# Patient Record
Sex: Male | Born: 1945
Health system: Southern US, Community
[De-identification: ages and names within clinical notes are randomized; demographics above are authoritative.]

## PROBLEM LIST (undated history)

## (undated) DIAGNOSIS — E7800 Pure hypercholesterolemia, unspecified: Secondary | ICD-10-CM

## (undated) DIAGNOSIS — I2510 Atherosclerotic heart disease of native coronary artery without angina pectoris: Secondary | ICD-10-CM

## (undated) DIAGNOSIS — I739 Peripheral vascular disease, unspecified: Secondary | ICD-10-CM

## (undated) DIAGNOSIS — N35919 Unspecified urethral stricture, male, unspecified site: Secondary | ICD-10-CM

## (undated) DIAGNOSIS — R0600 Dyspnea, unspecified: Secondary | ICD-10-CM

## (undated) DIAGNOSIS — E119 Type 2 diabetes mellitus without complications: Secondary | ICD-10-CM

## (undated) DIAGNOSIS — M1990 Unspecified osteoarthritis, unspecified site: Secondary | ICD-10-CM

## (undated) DIAGNOSIS — I10 Essential (primary) hypertension: Secondary | ICD-10-CM

## (undated) DIAGNOSIS — K219 Gastro-esophageal reflux disease without esophagitis: Secondary | ICD-10-CM

## (undated) DIAGNOSIS — N189 Chronic kidney disease, unspecified: Secondary | ICD-10-CM

## (undated) HISTORY — PX: CHOLECYSTECTOMY: SHX55

## (undated) HISTORY — PX: OTHER SURGICAL HISTORY: SHX169

## (undated) HISTORY — PX: EYE SURGERY: SHX253

## (undated) HISTORY — PX: BACK SURGERY: SHX140

---

## 1998-05-02 HISTORY — PX: CARDIAC CATHETERIZATION: SHX172

## 2004-04-15 ENCOUNTER — Ambulatory Visit: Payer: Self-pay | Admitting: Cardiology

## 2004-09-17 ENCOUNTER — Ambulatory Visit: Payer: Self-pay | Admitting: Cardiology

## 2005-01-12 ENCOUNTER — Ambulatory Visit: Payer: Self-pay | Admitting: Cardiology

## 2005-03-23 ENCOUNTER — Ambulatory Visit: Payer: Self-pay | Admitting: Family Medicine

## 2005-03-31 ENCOUNTER — Encounter (INDEPENDENT_AMBULATORY_CARE_PROVIDER_SITE_OTHER): Payer: Self-pay | Admitting: Internal Medicine

## 2005-04-19 ENCOUNTER — Ambulatory Visit: Payer: Self-pay | Admitting: Family Medicine

## 2005-04-21 ENCOUNTER — Ambulatory Visit (HOSPITAL_COMMUNITY): Admission: RE | Admit: 2005-04-21 | Discharge: 2005-04-21 | Payer: Self-pay | Admitting: Family Medicine

## 2005-05-02 DIAGNOSIS — I639 Cerebral infarction, unspecified: Secondary | ICD-10-CM

## 2005-05-02 HISTORY — DX: Cerebral infarction, unspecified: I63.9

## 2005-05-10 ENCOUNTER — Ambulatory Visit: Payer: Self-pay | Admitting: Family Medicine

## 2005-05-16 ENCOUNTER — Ambulatory Visit (HOSPITAL_COMMUNITY): Admission: RE | Admit: 2005-05-16 | Discharge: 2005-05-16 | Payer: Self-pay | Admitting: Family Medicine

## 2005-05-16 ENCOUNTER — Encounter (HOSPITAL_COMMUNITY): Admission: RE | Admit: 2005-05-16 | Discharge: 2005-06-15 | Payer: Self-pay | Admitting: Family Medicine

## 2005-07-18 ENCOUNTER — Ambulatory Visit: Payer: Self-pay | Admitting: Family Medicine

## 2005-08-22 ENCOUNTER — Ambulatory Visit: Payer: Self-pay | Admitting: Internal Medicine

## 2005-09-09 ENCOUNTER — Ambulatory Visit: Payer: Self-pay | Admitting: Internal Medicine

## 2005-12-31 ENCOUNTER — Encounter (INDEPENDENT_AMBULATORY_CARE_PROVIDER_SITE_OTHER): Payer: Self-pay | Admitting: Internal Medicine

## 2006-01-09 ENCOUNTER — Ambulatory Visit: Payer: Self-pay | Admitting: Internal Medicine

## 2006-02-21 ENCOUNTER — Ambulatory Visit: Payer: Self-pay | Admitting: Internal Medicine

## 2006-02-21 LAB — CONVERTED CEMR LAB
LDL Cholesterol: 153 mg/dL
Total CHOL/HDL Ratio: 4.1
Triglycerides: 104 mg/dL
VLDL: 21 mg/dL

## 2006-02-27 ENCOUNTER — Ambulatory Visit (HOSPITAL_COMMUNITY): Admission: RE | Admit: 2006-02-27 | Discharge: 2006-02-27 | Payer: Self-pay | Admitting: General Surgery

## 2006-02-27 ENCOUNTER — Encounter (INDEPENDENT_AMBULATORY_CARE_PROVIDER_SITE_OTHER): Payer: Self-pay | Admitting: *Deleted

## 2006-04-01 ENCOUNTER — Encounter (INDEPENDENT_AMBULATORY_CARE_PROVIDER_SITE_OTHER): Payer: Self-pay | Admitting: Internal Medicine

## 2006-04-03 ENCOUNTER — Ambulatory Visit: Payer: Self-pay | Admitting: Internal Medicine

## 2006-04-06 ENCOUNTER — Encounter: Payer: Self-pay | Admitting: Internal Medicine

## 2006-04-06 DIAGNOSIS — I10 Essential (primary) hypertension: Secondary | ICD-10-CM | POA: Insufficient documentation

## 2006-04-06 DIAGNOSIS — K449 Diaphragmatic hernia without obstruction or gangrene: Secondary | ICD-10-CM | POA: Insufficient documentation

## 2006-04-06 DIAGNOSIS — M545 Low back pain, unspecified: Secondary | ICD-10-CM | POA: Insufficient documentation

## 2006-04-06 DIAGNOSIS — J309 Allergic rhinitis, unspecified: Secondary | ICD-10-CM | POA: Insufficient documentation

## 2006-04-06 DIAGNOSIS — M069 Rheumatoid arthritis, unspecified: Secondary | ICD-10-CM | POA: Insufficient documentation

## 2006-04-06 DIAGNOSIS — I499 Cardiac arrhythmia, unspecified: Secondary | ICD-10-CM | POA: Insufficient documentation

## 2006-04-06 DIAGNOSIS — K219 Gastro-esophageal reflux disease without esophagitis: Secondary | ICD-10-CM | POA: Insufficient documentation

## 2006-04-06 DIAGNOSIS — E785 Hyperlipidemia, unspecified: Secondary | ICD-10-CM

## 2006-04-06 DIAGNOSIS — J40 Bronchitis, not specified as acute or chronic: Secondary | ICD-10-CM

## 2006-05-08 ENCOUNTER — Ambulatory Visit: Payer: Self-pay | Admitting: Internal Medicine

## 2006-06-05 ENCOUNTER — Ambulatory Visit: Payer: Self-pay | Admitting: Internal Medicine

## 2006-06-06 LAB — CONVERTED CEMR LAB
BUN: 21 mg/dL (ref 6–23)
Basophils Absolute: 0 10*3/uL (ref 0.0–0.1)
CO2: 23 meq/L (ref 19–32)
Chloride: 106 meq/L (ref 96–112)
Creatinine, Ser: 1.8 mg/dL — ABNORMAL HIGH (ref 0.40–1.50)
Creatinine, Urine: 196.3 mg/dL
Hemoglobin: 13.1 g/dL (ref 13.0–17.0)
Lymphocytes Relative: 24 % (ref 12–46)
Microalb Creat Ratio: 3.5 mg/g (ref 0.0–30.0)
Monocytes Absolute: 0.6 10*3/uL (ref 0.2–0.7)
Monocytes Relative: 11 % (ref 3–11)
Neutro Abs: 3.2 10*3/uL (ref 1.7–7.7)
Potassium: 4 meq/L (ref 3.5–5.3)
RBC: 4.71 M/uL (ref 4.22–5.81)

## 2006-07-17 ENCOUNTER — Ambulatory Visit: Payer: Self-pay | Admitting: Internal Medicine

## 2006-07-17 DIAGNOSIS — B353 Tinea pedis: Secondary | ICD-10-CM

## 2006-07-19 ENCOUNTER — Encounter (INDEPENDENT_AMBULATORY_CARE_PROVIDER_SITE_OTHER): Payer: Self-pay | Admitting: Internal Medicine

## 2006-07-27 ENCOUNTER — Telehealth (INDEPENDENT_AMBULATORY_CARE_PROVIDER_SITE_OTHER): Payer: Self-pay | Admitting: *Deleted

## 2006-08-09 ENCOUNTER — Telehealth (INDEPENDENT_AMBULATORY_CARE_PROVIDER_SITE_OTHER): Payer: Self-pay | Admitting: Internal Medicine

## 2006-08-10 ENCOUNTER — Telehealth (INDEPENDENT_AMBULATORY_CARE_PROVIDER_SITE_OTHER): Payer: Self-pay | Admitting: Internal Medicine

## 2006-08-18 ENCOUNTER — Encounter (INDEPENDENT_AMBULATORY_CARE_PROVIDER_SITE_OTHER): Payer: Self-pay | Admitting: Internal Medicine

## 2006-08-21 ENCOUNTER — Ambulatory Visit: Payer: Self-pay | Admitting: Internal Medicine

## 2006-08-22 LAB — CONVERTED CEMR LAB
AST: 25 units/L (ref 0–37)
Albumin: 4.1 g/dL (ref 3.5–5.2)
Alkaline Phosphatase: 92 units/L (ref 39–117)
BUN: 18 mg/dL (ref 6–23)
Creatinine, Ser: 1.62 mg/dL — ABNORMAL HIGH (ref 0.40–1.50)
HDL: 53 mg/dL (ref >39)
LDL Cholesterol: 93 mg/dL (ref 0–99)
Potassium: 4 meq/L (ref 3.5–5.3)
Total Bilirubin: 0.5 mg/dL (ref 0.3–1.2)
Total CHOL/HDL Ratio: 3.2
Triglycerides: 104 mg/dL (ref <150)
VLDL: 21 mg/dL (ref 0–40)

## 2006-08-25 ENCOUNTER — Encounter (INDEPENDENT_AMBULATORY_CARE_PROVIDER_SITE_OTHER): Payer: Self-pay | Admitting: Internal Medicine

## 2006-10-16 ENCOUNTER — Ambulatory Visit: Payer: Self-pay | Admitting: Internal Medicine

## 2006-10-16 ENCOUNTER — Telehealth (INDEPENDENT_AMBULATORY_CARE_PROVIDER_SITE_OTHER): Payer: Self-pay | Admitting: *Deleted

## 2006-10-16 DIAGNOSIS — K5900 Constipation, unspecified: Secondary | ICD-10-CM | POA: Insufficient documentation

## 2006-10-16 LAB — CONVERTED CEMR LAB: Hgb A1c MFr Bld: 6.6 %

## 2007-01-05 ENCOUNTER — Encounter (INDEPENDENT_AMBULATORY_CARE_PROVIDER_SITE_OTHER): Payer: Self-pay | Admitting: Internal Medicine

## 2007-01-15 ENCOUNTER — Ambulatory Visit: Payer: Self-pay | Admitting: Internal Medicine

## 2007-01-15 DIAGNOSIS — R0602 Shortness of breath: Secondary | ICD-10-CM

## 2007-01-22 ENCOUNTER — Encounter (INDEPENDENT_AMBULATORY_CARE_PROVIDER_SITE_OTHER): Payer: Self-pay

## 2007-03-14 ENCOUNTER — Ambulatory Visit: Payer: Self-pay | Admitting: Internal Medicine

## 2007-03-14 LAB — CONVERTED CEMR LAB
Nitrite: NEGATIVE
Protein, U semiquant: NEGATIVE
Urobilinogen, UA: 1

## 2007-03-15 ENCOUNTER — Encounter (INDEPENDENT_AMBULATORY_CARE_PROVIDER_SITE_OTHER): Payer: Self-pay | Admitting: Internal Medicine

## 2007-03-15 LAB — CONVERTED CEMR LAB: RBC / HPF: NONE SEEN (ref <3)

## 2007-03-28 ENCOUNTER — Encounter (INDEPENDENT_AMBULATORY_CARE_PROVIDER_SITE_OTHER): Payer: Self-pay | Admitting: Internal Medicine

## 2007-04-16 ENCOUNTER — Telehealth (INDEPENDENT_AMBULATORY_CARE_PROVIDER_SITE_OTHER): Payer: Self-pay | Admitting: *Deleted

## 2007-04-16 ENCOUNTER — Ambulatory Visit: Payer: Self-pay | Admitting: Internal Medicine

## 2007-04-16 ENCOUNTER — Ambulatory Visit (HOSPITAL_COMMUNITY): Admission: RE | Admit: 2007-04-16 | Discharge: 2007-04-16 | Payer: Self-pay | Admitting: Internal Medicine

## 2007-04-16 DIAGNOSIS — N401 Enlarged prostate with lower urinary tract symptoms: Secondary | ICD-10-CM

## 2007-04-16 DIAGNOSIS — N400 Enlarged prostate without lower urinary tract symptoms: Secondary | ICD-10-CM | POA: Insufficient documentation

## 2007-04-16 DIAGNOSIS — R071 Chest pain on breathing: Secondary | ICD-10-CM

## 2007-04-23 ENCOUNTER — Encounter (INDEPENDENT_AMBULATORY_CARE_PROVIDER_SITE_OTHER): Payer: Self-pay | Admitting: Internal Medicine

## 2007-04-23 DIAGNOSIS — D72821 Monocytosis (symptomatic): Secondary | ICD-10-CM

## 2007-05-03 ENCOUNTER — Encounter: Payer: Self-pay | Admitting: Family Medicine

## 2007-06-01 ENCOUNTER — Ambulatory Visit: Payer: Self-pay | Admitting: Internal Medicine

## 2007-06-18 ENCOUNTER — Encounter (INDEPENDENT_AMBULATORY_CARE_PROVIDER_SITE_OTHER): Payer: Self-pay | Admitting: Internal Medicine

## 2007-07-03 ENCOUNTER — Encounter (INDEPENDENT_AMBULATORY_CARE_PROVIDER_SITE_OTHER): Payer: Self-pay | Admitting: Internal Medicine

## 2007-07-13 ENCOUNTER — Telehealth (INDEPENDENT_AMBULATORY_CARE_PROVIDER_SITE_OTHER): Payer: Self-pay | Admitting: Internal Medicine

## 2007-07-16 ENCOUNTER — Telehealth (INDEPENDENT_AMBULATORY_CARE_PROVIDER_SITE_OTHER): Payer: Self-pay | Admitting: Internal Medicine

## 2007-07-16 ENCOUNTER — Ambulatory Visit: Payer: Self-pay | Admitting: Internal Medicine

## 2007-07-16 DIAGNOSIS — N183 Chronic kidney disease, stage 3 unspecified: Secondary | ICD-10-CM | POA: Insufficient documentation

## 2007-07-16 LAB — CONVERTED CEMR LAB
Blood Glucose, AC Bkfst: 123 mg/dL
Creatinine, Urine: 165.8 mg/dL
Hgb A1c MFr Bld: 5 %
Microalb, Ur: 0.59 mg/dL (ref 0.00–1.89)

## 2007-07-17 ENCOUNTER — Telehealth (INDEPENDENT_AMBULATORY_CARE_PROVIDER_SITE_OTHER): Payer: Self-pay | Admitting: *Deleted

## 2007-07-17 LAB — CONVERTED CEMR LAB
ALT: 32 units/L (ref 0–53)
AST: 27 units/L (ref 0–37)
Albumin: 3.9 g/dL (ref 3.5–5.2)
Basophils Absolute: 0 10*3/uL (ref 0.0–0.1)
Basophils Relative: 0 % (ref 0–1)
CO2: 22 meq/L (ref 19–32)
Calcium: 8.9 mg/dL (ref 8.4–10.5)
Chloride: 106 meq/L (ref 96–112)
Cholesterol: 154 mg/dL (ref 0–200)
Creatinine, Ser: 1.42 mg/dL (ref 0.40–1.50)
Hemoglobin: 13.5 g/dL (ref 13.0–17.0)
Hgb A1c MFr Bld: 7.1 % — ABNORMAL HIGH (ref 4.6–6.1)
Lymphocytes Relative: 25 % (ref 12–46)
MCHC: 32.5 g/dL (ref 30.0–36.0)
Neutro Abs: 3 10*3/uL (ref 1.7–7.7)
Neutrophils Relative %: 56 % (ref 43–77)
Potassium: 4 meq/L (ref 3.5–5.3)
RDW: 16.7 % — ABNORMAL HIGH (ref 11.5–15.5)
Sodium: 140 meq/L (ref 135–145)
Total CHOL/HDL Ratio: 2.9
Total Protein: 7.6 g/dL (ref 6.0–8.3)

## 2007-07-24 ENCOUNTER — Ambulatory Visit (HOSPITAL_COMMUNITY): Admission: RE | Admit: 2007-07-24 | Discharge: 2007-07-24 | Payer: Self-pay | Admitting: Urology

## 2007-08-07 ENCOUNTER — Ambulatory Visit: Payer: Self-pay | Admitting: Internal Medicine

## 2007-08-07 DIAGNOSIS — J209 Acute bronchitis, unspecified: Secondary | ICD-10-CM

## 2007-08-07 LAB — CONVERTED CEMR LAB: Inflenza A Ag: NEGATIVE

## 2007-08-23 ENCOUNTER — Ambulatory Visit: Payer: Self-pay | Admitting: Internal Medicine

## 2007-08-23 DIAGNOSIS — R05 Cough: Secondary | ICD-10-CM | POA: Insufficient documentation

## 2007-10-17 ENCOUNTER — Encounter (INDEPENDENT_AMBULATORY_CARE_PROVIDER_SITE_OTHER): Payer: Self-pay | Admitting: Internal Medicine

## 2007-12-28 ENCOUNTER — Ambulatory Visit: Payer: Self-pay | Admitting: Internal Medicine

## 2008-02-05 ENCOUNTER — Telehealth (INDEPENDENT_AMBULATORY_CARE_PROVIDER_SITE_OTHER): Payer: Self-pay | Admitting: *Deleted

## 2008-02-15 ENCOUNTER — Ambulatory Visit: Payer: Self-pay | Admitting: Internal Medicine

## 2008-03-03 ENCOUNTER — Encounter (INDEPENDENT_AMBULATORY_CARE_PROVIDER_SITE_OTHER): Payer: Self-pay | Admitting: Internal Medicine

## 2008-06-03 ENCOUNTER — Encounter (INDEPENDENT_AMBULATORY_CARE_PROVIDER_SITE_OTHER): Payer: Self-pay | Admitting: Internal Medicine

## 2008-07-01 ENCOUNTER — Ambulatory Visit: Payer: Self-pay | Admitting: Internal Medicine

## 2008-07-01 DIAGNOSIS — E1165 Type 2 diabetes mellitus with hyperglycemia: Secondary | ICD-10-CM

## 2008-07-01 DIAGNOSIS — E119 Type 2 diabetes mellitus without complications: Secondary | ICD-10-CM | POA: Insufficient documentation

## 2008-07-01 DIAGNOSIS — E1129 Type 2 diabetes mellitus with other diabetic kidney complication: Secondary | ICD-10-CM

## 2008-07-01 LAB — CONVERTED CEMR LAB
Blood Glucose, Fingerstick: 239
Hgb A1c MFr Bld: 8 %

## 2008-08-14 ENCOUNTER — Encounter (INDEPENDENT_AMBULATORY_CARE_PROVIDER_SITE_OTHER): Payer: Self-pay | Admitting: Internal Medicine

## 2008-09-01 ENCOUNTER — Encounter (INDEPENDENT_AMBULATORY_CARE_PROVIDER_SITE_OTHER): Payer: Self-pay | Admitting: Internal Medicine

## 2008-12-15 ENCOUNTER — Encounter (INDEPENDENT_AMBULATORY_CARE_PROVIDER_SITE_OTHER): Payer: Self-pay | Admitting: Internal Medicine

## 2010-05-30 LAB — CONVERTED CEMR LAB
ALT: 39 units/L
AST: 30 units/L
Albumin: 3.5 g/dL
Bilirubin Urine: NEGATIVE
Blood Glucose, Fingerstick: 183
Creatinine, Ser: 2 mg/dL
Eosinophils Relative: 1.9 %
Hemoglobin: 12.9 g/dL
Hgb A1c MFr Bld: 7 %
Ketones, urine, test strip: NEGATIVE
Lymphocytes Relative: 26.6 %
MCV: 85.2 fL
Monocytes Relative: 21.3 %
OCCULT 1: NEGATIVE
Specific Gravity, Urine: 1.02
Total Bilirubin: 0.3 mg/dL
Urobilinogen, UA: NEGATIVE

## 2010-06-01 NOTE — Letter (Signed)
Summary: Historic Patient File  Historic Patient File   Imported By: Lind Guest 03/23/2010 09:13:33  _____________________________________________________________________  External Attachment:    Type:   Image     Comment:   External Document

## 2010-09-14 NOTE — H&P (Signed)
NAME:  Treadwell, Bora              ACCOUNT NO.:  [id number]   MEDICAL RECORD NO.:  [id number]          PATIENT TYPE:  OUT   LOCATION:  RAD                           FACILITY:  [facility]   PHYSICIAN:  Ky Barban, M.D.DATE OF BIRTH:  10-30-1945   DATE OF ADMISSION:  DATE OF DISCHARGE:  LH                              HISTORY & PHYSICAL   Mr. Osoria is a 65 year old gentleman who has a urethral stricture and  couple of years ago I had done internal urethrotomy.  He was doing fine.  Now he is complaining urinary stream is slowing back again.  I want him  to have a retrograde urethrogram for which he will be coming as  outpatient to the radiology department and they should do this  retrograde urethrogram.   PAST MEDICAL HISTORY:  Otherwise unremarkable.   EXAMINATION:  Moderately obese, not in acute distress, fully conscious,  alert, oriented and blood pressure 121/77, temperature 97.5.  Central  nervous system:  Negative.  Head, neck, ENT:  Negative.  Chest:  Symmetrical.  Heart:  Regular sinus rhythm, no murmur.  Abdominal is  soft, flat.  Liver, spleen, kidneys not palpable.  External genitalia is  unremarkable and testicles are normal.  Rectal exam:  Prostate is 1.5+  smooth and firm.   IMPRESSION:  Urethral stricture.   PLAN:  Retrograde urethrogram by radiology [facility].      Ky Barban, M.D.  Electronically Signed     MIJ/MEDQ  D:  07/23/2007  T:  07/23/2007  Job:  [id number]

## 2010-09-17 NOTE — H&P (Signed)
NAME:  Jonathan [name], Jonathan [name]              ACCOUNT NO.:  [id number]   MEDICAL RECORD NO.:  [id number]          PATIENT TYPE:  AMB   LOCATION:  DAY                           FACILITY:  [facility]   PHYSICIAN:  Dalia Heading, M.D.  DATE OF BIRTH:  [birth year]   DATE OF ADMISSION:  DATE OF DISCHARGE:  LH                                HISTORY & PHYSICAL   CHIEF COMPLAINT:  Cholecystitis, cholelithiasis.   HISTORY OF PRESENT ILLNESS:  The patient is a 65 year old black male who is  referred for evaluation and treatment OF biliary colic secondary to  cholelithiasis.  He has been having right upper quadrant abdominal pain,  nausea, and bloating for many weeks.  He does have fatty food intolerance.  No fever, chills, or jaundice have been noted.   PAST MEDICAL HISTORY:  1. Chronic back pain.  2. Hypertension.  3. Reflux disease.   PAST SURGICAL HISTORY:  Three back surgeries.   CURRENT MEDICATIONS:  Baby aspirin, Lotrel, Nasonex, labetalol,  methotrexate, tramadol, Nexium, hydrochlorothiazide, B12, folic acid,  Norvasc, Lotensin, prednisone.   ALLERGIES:  No known drug allergies.   REVIEW OF SYSTEMS:  The patient denies drinking or smoking.   PHYSICAL EXAMINATION:  GENERAL:  The patient is a well-developed, well-  nourished black male in no acute distress.  HEENT: Reveals no scleral icterus.  LUNGS:  Clear to auscultation. with equal breath sounds bilaterally.  HEART:  Reveals a regular rate and rhythm, without S3, S4, or murmurs.  ABDOMEN:  Soft and nondistended.  He is tender in the right upper quadrant  to palpation.  No hepatosplenomegaly, masses, or hernias are identified.   Ultrasound of the gallbladder reveals cholelithiasis, with a normal common  bile duct.   IMPRESSION:  Cholecystitis, cholelithiasis.   PLAN:  The patient is scheduled for a laparoscopic cholecystectomy on  February 27, 2006.  The risks and benefits of the procedure, including  bleeding, infection, poor wound  healing, hepatobiliary injury, and the  possibility of an open procedure were fully explained to the patient, who  gave informed consent.      Dalia Heading, M.D.  Electronically Signed     MAJ/MEDQ  D:  01/31/2006  T:  02/01/2006  Job:  [id number]   cc:   Dr. Jen Mow

## 2010-09-17 NOTE — Op Note (Signed)
NAME:  Jonathan [name], Jonathan [name]              ACCOUNT NO.:  [id number]   MEDICAL RECORD NO.:  [id number]          PATIENT TYPE:  AMB   LOCATION:  DAY                           FACILITY:  [facility]   PHYSICIAN:  Dalia Heading, M.D.  DATE OF BIRTH:  1946/03/30   DATE OF PROCEDURE:  02/27/2006  DATE OF DISCHARGE:                                 OPERATIVE REPORT   PREOPERATIVE DIAGNOSIS:  Cholecystitis, cholelithiasis.   POSTOPERATIVE DIAGNOSIS:  Cholecystitis, cholelithiasis.   PROCEDURE:  Laparoscopic cholecystectomy.   SURGEON:  Dr. Franky Macho.   ASSISTANT:  Dr. Arna Snipe.   ANESTHESIA:  General endotracheal.   INDICATIONS:  The patient is a 65 year old black male who presents with  cholecystitis secondary to cholelithiasis.  Risks and benefits of procedure  including bleeding, infection, hepatobiliary injury, and the possibility of  an open procedure were fully explained to the patient, who gave informed  consent.   PROCEDURE NOTE:  The patient was placed in supine position.  After induction  of general endotracheal anesthesia, the abdomen was prepped and draped using  usual sterile technique with Betadine.  Surgical site confirmation was  performed.   A supraumbilical incision was made down to the fascia.  Veress needle was  introduced into the abdominal cavity and confirmation of placement was done  using the saline drop test.  The abdomen was then insufflated to 16 mmHg  pressure.  11 mm trocar was introduced into the abdominal cavity under  direct visualization without difficulty.  The patient was placed in reversed  Trendelenburg position.  Additional 11-mm trocar was placed in the  epigastric region and 5-mm trocars were placed in the right upper quadrant  and right flank regions.  Liver was inspected and noted to be within normal  limits.  The gallbladder was retracted superiorly and laterally.  Dissection  was begun around the infundibulum of the gallbladder.  The cystic  duct was  first identified.  Its juncture to the infundibulum fully identified.  Endo  clips placed proximally and distally on the cystic duct and cystic duct was  divided.  This was likewise done to cystic artery.  The gallbladder was then  freed away from the gallbladder fossa using Bovie electrocautery.  The  gallbladder delivered through the epigastric trocar site using EndoCatch  bag.  The gallbladder fossa was inspected.  No abnormal bleeding or bile  leakage was noted.  Surgicel was placed in the gallbladder fossa.  All fluid  and air were then evacuated from the abdominal cavity prior to removal of  the trocars.   All wounds were irrigated with normal saline.  All wounds were checked with  0.5 cm Sensorcaine.  The supraumbilical fascia was reapproximated using a 0  Vicryl interrupted suture.  All skin incisions were closed using staples.  Betadine ointment, dry sterile dressings were applied.   All tape and needle counts correct end the procedure.  The patient was  extubated in the operating room went back to recovery room awake in stable  condition.   COMPLICATIONS:  None.   SPECIMEN:  Gallbladder.   BLOOD LOSS:  Minimal.      Dalia Heading, M.D.  Electronically Signed     MAJ/MEDQ  D:  02/27/2006  T:  02/27/2006  Job:  [id number]   cc:   Dr. Kandice Hams

## 2012-10-29 ENCOUNTER — Emergency Department (HOSPITAL_COMMUNITY): Payer: No Typology Code available for payment source

## 2012-10-29 ENCOUNTER — Emergency Department (HOSPITAL_COMMUNITY)
Admission: EM | Admit: 2012-10-29 | Discharge: 2012-10-29 | Disposition: A | Discharge status: Home / Self Care | Payer: No Typology Code available for payment source | Attending: Emergency Medicine | Admitting: Emergency Medicine

## 2012-10-29 ENCOUNTER — Encounter (HOSPITAL_COMMUNITY): Payer: Self-pay | Admitting: *Deleted

## 2012-10-29 DIAGNOSIS — S7002XA Contusion of left hip, initial encounter: Secondary | ICD-10-CM

## 2012-10-29 DIAGNOSIS — I2510 Atherosclerotic heart disease of native coronary artery without angina pectoris: Secondary | ICD-10-CM | POA: Insufficient documentation

## 2012-10-29 DIAGNOSIS — S7000XA Contusion of unspecified hip, initial encounter: Secondary | ICD-10-CM | POA: Insufficient documentation

## 2012-10-29 DIAGNOSIS — M545 Low back pain, unspecified: Secondary | ICD-10-CM

## 2012-10-29 DIAGNOSIS — E119 Type 2 diabetes mellitus without complications: Secondary | ICD-10-CM | POA: Insufficient documentation

## 2012-10-29 DIAGNOSIS — Z8739 Personal history of other diseases of the musculoskeletal system and connective tissue: Secondary | ICD-10-CM | POA: Insufficient documentation

## 2012-10-29 DIAGNOSIS — I10 Essential (primary) hypertension: Secondary | ICD-10-CM | POA: Insufficient documentation

## 2012-10-29 DIAGNOSIS — IMO0002 Reserved for concepts with insufficient information to code with codable children: Secondary | ICD-10-CM | POA: Insufficient documentation

## 2012-10-29 DIAGNOSIS — Y92410 Unspecified street and highway as the place of occurrence of the external cause: Secondary | ICD-10-CM | POA: Insufficient documentation

## 2012-10-29 DIAGNOSIS — Y9389 Activity, other specified: Secondary | ICD-10-CM | POA: Insufficient documentation

## 2012-10-29 HISTORY — DX: Unspecified osteoarthritis, unspecified site: M19.90

## 2012-10-29 HISTORY — DX: Type 2 diabetes mellitus without complications: E11.9

## 2012-10-29 HISTORY — DX: Essential (primary) hypertension: I10

## 2012-10-29 HISTORY — DX: Atherosclerotic heart disease of native coronary artery without angina pectoris: I25.10

## 2012-10-29 MED ORDER — HYDROCODONE-ACETAMINOPHEN 5-325 MG PO TABS: ORAL_TABLET | ORAL | Status: DC

## 2012-10-29 MED ORDER — CYCLOBENZAPRINE HCL 10 MG PO TABS: 10.0000 mg | ORAL_TABLET | Freq: Three times a day (TID) | ORAL | Status: DC | PRN

## 2012-10-29 MED ORDER — CYCLOBENZAPRINE HCL 10 MG PO TABS
10.0000 mg | ORAL_TABLET | Freq: Once | ORAL | Status: AC
  Administered 2012-10-29: 10 mg via ORAL
  Filled 2012-10-29: qty 1

## 2012-10-29 MED ORDER — HYDROCODONE-ACETAMINOPHEN 5-325 MG PO TABS
1.0000 | ORAL_TABLET | Freq: Once | ORAL | Status: AC
  Administered 2012-10-29: 1 via ORAL
  Filled 2012-10-29: qty 1
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
## 2012-10-29 NOTE — ED Notes (Signed)
Pt ambulated to restroom w/out difficulty.  nad noted
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
## 2015-10-01 DIAGNOSIS — E119 Type 2 diabetes mellitus without complications: Secondary | ICD-10-CM | POA: Diagnosis not present

## 2015-10-01 DIAGNOSIS — M545 Low back pain: Secondary | ICD-10-CM | POA: Diagnosis not present

## 2015-10-01 DIAGNOSIS — K5900 Constipation, unspecified: Secondary | ICD-10-CM | POA: Diagnosis not present

## 2015-10-01 DIAGNOSIS — E782 Mixed hyperlipidemia: Secondary | ICD-10-CM | POA: Diagnosis not present

## 2015-10-01 DIAGNOSIS — J302 Other seasonal allergic rhinitis: Secondary | ICD-10-CM | POA: Diagnosis not present

## 2015-10-01 DIAGNOSIS — M1991 Primary osteoarthritis, unspecified site: Secondary | ICD-10-CM | POA: Diagnosis not present

## 2015-10-01 DIAGNOSIS — I10 Essential (primary) hypertension: Secondary | ICD-10-CM | POA: Diagnosis not present

## 2015-10-01 DIAGNOSIS — K219 Gastro-esophageal reflux disease without esophagitis: Secondary | ICD-10-CM | POA: Diagnosis not present

## 2015-11-04 DIAGNOSIS — H524 Presbyopia: Secondary | ICD-10-CM | POA: Diagnosis not present

## 2015-11-04 DIAGNOSIS — H5210 Myopia, unspecified eye: Secondary | ICD-10-CM | POA: Diagnosis not present

## 2015-11-13 DIAGNOSIS — M545 Low back pain: Secondary | ICD-10-CM | POA: Diagnosis not present

## 2015-11-13 DIAGNOSIS — Z6837 Body mass index (BMI) 37.0-37.9, adult: Secondary | ICD-10-CM | POA: Diagnosis not present

## 2015-11-30 DIAGNOSIS — J209 Acute bronchitis, unspecified: Secondary | ICD-10-CM | POA: Diagnosis not present

## 2015-11-30 DIAGNOSIS — J0101 Acute recurrent maxillary sinusitis: Secondary | ICD-10-CM | POA: Diagnosis not present

## 2015-11-30 DIAGNOSIS — H10023 Other mucopurulent conjunctivitis, bilateral: Secondary | ICD-10-CM | POA: Diagnosis not present

## 2015-11-30 DIAGNOSIS — Z6837 Body mass index (BMI) 37.0-37.9, adult: Secondary | ICD-10-CM | POA: Diagnosis not present

## 2015-12-09 ENCOUNTER — Emergency Department (HOSPITAL_COMMUNITY)
Admission: EM | Admit: 2015-12-09 | Discharge: 2015-12-09 | Disposition: A | Discharge status: Home / Self Care | Payer: Commercial Managed Care - HMO | Attending: Emergency Medicine | Admitting: Emergency Medicine

## 2015-12-09 ENCOUNTER — Encounter (HOSPITAL_COMMUNITY): Payer: Self-pay | Admitting: *Deleted

## 2015-12-09 ENCOUNTER — Emergency Department (HOSPITAL_COMMUNITY): Payer: Commercial Managed Care - HMO

## 2015-12-09 DIAGNOSIS — I2510 Atherosclerotic heart disease of native coronary artery without angina pectoris: Secondary | ICD-10-CM | POA: Diagnosis not present

## 2015-12-09 DIAGNOSIS — Z7982 Long term (current) use of aspirin: Secondary | ICD-10-CM | POA: Insufficient documentation

## 2015-12-09 DIAGNOSIS — R059 Cough, unspecified: Secondary | ICD-10-CM

## 2015-12-09 DIAGNOSIS — I10 Essential (primary) hypertension: Secondary | ICD-10-CM | POA: Insufficient documentation

## 2015-12-09 DIAGNOSIS — Z7984 Long term (current) use of oral hypoglycemic drugs: Secondary | ICD-10-CM | POA: Insufficient documentation

## 2015-12-09 DIAGNOSIS — E119 Type 2 diabetes mellitus without complications: Secondary | ICD-10-CM | POA: Diagnosis not present

## 2015-12-09 DIAGNOSIS — R05 Cough: Secondary | ICD-10-CM | POA: Diagnosis not present

## 2015-12-09 DIAGNOSIS — Z792 Long term (current) use of antibiotics: Secondary | ICD-10-CM | POA: Insufficient documentation

## 2015-12-09 DIAGNOSIS — Z79899 Other long term (current) drug therapy: Secondary | ICD-10-CM | POA: Diagnosis not present

## 2015-12-09 MED ORDER — BENZONATATE 100 MG PO CAPS: 100.0000 mg | ORAL_CAPSULE | Freq: Three times a day (TID) | ORAL | 0 refills | Status: DC

## 2015-12-09 NOTE — ED Triage Notes (Signed)
Pt comes in for a cough lasting 3 weeks with productive sputum. Pt denies any fevers, n/v/d.

## 2015-12-09 NOTE — Discharge Instructions (Signed)
Continue your current medicines current dosages.  Follow-up with your doctor with any worsening symptoms.  No prescription for Tessalon for your cough.

## 2015-12-09 NOTE — ED Provider Notes (Signed)
[facility] DEPT Provider Note   CSN: [id number] Arrival date & time: 12/09/15  0950  First Provider Contact:   First MD Initiated Contact with Patient 12/09/15 1019      By signing my name below, I, Rayna Sexton, attest that this documentation has been prepared under the direction and in the presence of Tanna Furry, MD. Electronically Signed: Rayna Sexton, ED Scribe. 12/09/15. 10:27 AM.   History   Chief Complaint Chief Complaint  Patient presents with  . Cough    HPI HPI Comments: Jonathan [name] is a 70 y.o. male who presents to the Emergency Department complaining of constant, moderate, productive cough x 2 weeks. He reports associated sinus and chest congestion, rib pain when coughing and states his sputum is green. He has been evaluated by his PCP and was rx amoxacillin on 11/30/2015 and has also been taking hydromet cough syrup and Flonase from a prior rx w/o relief. Pt denies a hx of smoking. He denies fevers, any regions of constant pain or any other associated symptoms at this time.   The history is provided by the patient. No language interpreter was used.    Past Medical History:  Diagnosis Date  . Arthritis   . Coronary artery disease   . Diabetes mellitus without complication ([place])   . Hypertension     Patient Active Problem List   Diagnosis Date Noted  . DIABETES MELLITUS, UNCONTROLLED, WITH RENAL COMPLICATIONS [id number]  . COUGH 08/23/2007  . BRONCHITIS, ACUTE WITH BRONCHOSPASM 08/07/2007  . RENAL DISEASE, CHRONIC, STAGE III 07/16/2007  . MONOCYTOSIS SYMPTOMATIC 04/23/2007  . HYPERTROPHY PROSTATE W/UR OBST & OTH LUTS 04/16/2007  . CHEST PAIN, PLEURITIC 04/16/2007  . DYSPNEA 01/15/2007  . CONSTIPATION NOS 10/16/2006  . TINEA PEDIS 07/17/2006  . HYPERLIPIDEMIA 04/06/2006  . HYPERTENSION 04/06/2006  . CARDIAC ARRHYTHMIA 04/06/2006  . ALLERGIC RHINITIS 04/06/2006  . BRONCHITIS NOS 04/06/2006  . GERD 04/06/2006  . HIATAL HERNIA 04/06/2006  .  RHEUMATOID ARTHRITIS 04/06/2006  . LOW BACK PAIN 04/06/2006    Past Surgical History:  Procedure Laterality Date  . BACK SURGERY    . CHOLECYSTECTOMY       Home Medications    Prior to Admission medications   Medication Sig Start Date End Date Taking? Authorizing Provider  amLODipine (NORVASC) 5 MG tablet Take 5 mg by mouth daily.   Yes Historical Provider, MD  amoxicillin (AMOXIL) 500 MG capsule Take 1,500 mg by mouth 3 (three) times daily.   Yes Historical Provider, MD  Ascorbic Acid (VITAMIN C) 1000 MG tablet Take 1,000 mg by mouth daily.   Yes Historical Provider, MD  aspirin EC 81 MG tablet Take 81 mg by mouth daily.   Yes Historical Provider, MD  atorvastatin (LIPITOR) 20 MG tablet Take 20 mg by mouth at bedtime.   Yes Historical Provider, MD  cholecalciferol (VITAMIN D) 1000 UNITS tablet Take 1,000 Units by mouth daily.   Yes Historical Provider, MD  fexofenadine (ALLEGRA) 180 MG tablet Take 180 mg by mouth daily.   Yes Historical Provider, MD  folic acid (FOLVITE) A999333 MCG tablet Take 400 mcg by mouth daily.   Yes Historical Provider, MD  glipiZIDE (GLUCOTROL) 5 MG tablet Take 5 mg by mouth 2 (two) times daily before a meal.   Yes Historical Provider, MD  HYDROcodone-homatropine (HYCODAN) 5-1.5 MG/5ML syrup Take 5 mLs by mouth 3 (three) times daily.   Yes Historical Provider, MD  losartan (COZAAR) 100 MG tablet Take 100 mg by mouth daily.  Yes Historical Provider, MD  METHOTREXATE SODIUM IJ Inject 2.5 mLs as directed once a week.   Yes Historical Provider, MD  metoprolol succinate (TOPROL-XL) 50 MG 24 hr tablet Take 50 mg by mouth 2 (two) times daily. Take with or immediately following a meal.   Yes Historical Provider, MD  mometasone (NASONEX) 50 MCG/ACT nasal spray Place 2 sprays into the nose daily.   Yes Historical Provider, MD  omeprazole (PRILOSEC) 20 MG capsule Take 20 mg by mouth daily.   Yes Historical Provider, MD  predniSONE (DELTASONE) 2.5 MG tablet Take 2.5 mg by  mouth daily with breakfast.   Yes Historical Provider, MD  tetrahydrozoline 0.05 % ophthalmic solution Place 1 drop into both eyes daily as needed (Allergies).   Yes Historical Provider, MD  benzonatate (TESSALON) 100 MG capsule Take 1 capsule (100 mg total) by mouth every 8 (eight) hours. 12/09/15   Tanna Furry, MD  HYDROcodone-acetaminophen (NORCO/VICODIN) 5-325 MG per tablet Take one-two tabs po q 4-6 hrs prn pain Patient not taking: Reported on 12/09/2015 10/29/12   Kem Parkinson, PA-C    Family History No family history on file.  Social History Social History  Substance Use Topics  . Smoking status: Never Smoker  . Smokeless tobacco: Never Used  . Alcohol use No     Allergies   Oxycodone-acetaminophen   Review of Systems Review of Systems  Constitutional: Negative for appetite change, chills, diaphoresis, fatigue and fever.  HENT: Positive for congestion, rhinorrhea and sinus pressure. Negative for mouth sores, sore throat and trouble swallowing.   Eyes: Negative for visual disturbance.  Respiratory: Positive for cough. Negative for chest tightness, shortness of breath and wheezing.   Cardiovascular: Negative for chest pain.  Gastrointestinal: Negative for abdominal distention, abdominal pain, diarrhea, nausea and vomiting.  Endocrine: Negative for polydipsia, polyphagia and polyuria.  Genitourinary: Negative for dysuria, frequency and hematuria.  Musculoskeletal: Negative for gait problem.  Skin: Negative for color change, pallor and rash.  Neurological: Negative for dizziness, syncope, light-headedness and headaches.  Hematological: Does not bruise/bleed easily.  Psychiatric/Behavioral: Negative for behavioral problems and confusion.   Physical Exam Updated Vital Signs BP 134/81   Pulse 75   Temp 98.8 F (37.1 C) (Oral)   Ht 5\' 9"  (1.753 m)   Wt 240 lb (108.9 kg)   SpO2 96%   BMI 35.44 kg/m   Physical Exam  Constitutional: He is oriented to person, place, and  time. He appears well-developed and well-nourished. No distress.  HENT:  Head: Normocephalic.  Eyes: Conjunctivae are normal. Pupils are equal, round, and reactive to light. No scleral icterus.  Neck: Normal range of motion. Neck supple. No thyromegaly present.  Cardiovascular: Normal rate and regular rhythm.  Exam reveals no gallop and no friction rub.   No murmur heard. Pulmonary/Chest: Effort normal and breath sounds normal. No respiratory distress. He has no wheezes. He has no rales.  Abdominal: Soft. Bowel sounds are normal. He exhibits no distension. There is no tenderness. There is no rebound.  Musculoskeletal: Normal range of motion.  Neurological: He is alert and oriented to person, place, and time.  Skin: Skin is warm and dry. No rash noted.  Psychiatric: He has a normal mood and affect. His behavior is normal.   ED Treatments / Results  Labs (all labs ordered are listed, but only abnormal results are displayed) Labs Reviewed - No data to display  EKG  EKG Interpretation None       Radiology Dg Chest 2  View  Result Date: 12/09/2015 CLINICAL DATA:  Cough for 3 weeks. EXAM: CHEST  2 VIEW COMPARISON:  CT chest and single view of the chest 09/05/2015. PA and lateral chest 09/06/2011. FINDINGS: Lung volumes are somewhat low. Streaky bibasilar airspace opacities are more notable on the right. No pneumothorax or pleural effusion. Heart size is mildly enlarged. IMPRESSION: Right greater than left streaky bibasilar airspace opacities are likely due to atelectasis in this low volume chest. Pneumonia on the right cannot be excluded. Electronically Signed   By: Inge Rise M.D.   On: 12/09/2015 11:19    Procedures Procedures  DIAGNOSTIC STUDIES: Oxygen Saturation is 100% on RA, normal by my interpretation.    COORDINATION OF CARE: 10:25 AM Discussed next steps with pt. Pt verbalized understanding and is agreeable with the plan.    Medications Ordered in ED Medications - No  data to display   Initial Impression / Assessment and Plan / ED Course  I have reviewed the triage vital signs and the nursing notes.  Pertinent labs & imaging results that were available during my care of the patient were reviewed by me and considered in my medical decision making (see chart for details).  Clinical Course    I personally performed the services described in this documentation, which was scribed in my presence. The recorded information has been reviewed and is accurate.   Final Clinical Impressions(s) / ED Diagnoses   Final diagnoses:  Cough    X-ray findings discussed with patient. He has no clinical exam findings at the base of his right posterior chest suggest infiltrate. He does not have tachypnea or tachycardia. Is not hypoxemic or febrile. If this is a subtle required pneumonia amoxicillin should be adequate coverage. Have asked to recheck with his primary care physician. Continue his current medications. Add Tessalon for cough.  New Prescriptions New Prescriptions   BENZONATATE (TESSALON) 100 MG CAPSULE    Take 1 capsule (100 mg total) by mouth every 8 (eight) hours.     Tanna Furry, MD 12/09/15 1227

## 2016-01-27 DIAGNOSIS — M069 Rheumatoid arthritis, unspecified: Secondary | ICD-10-CM | POA: Diagnosis not present

## 2016-01-27 DIAGNOSIS — I10 Essential (primary) hypertension: Secondary | ICD-10-CM | POA: Diagnosis not present

## 2016-01-27 DIAGNOSIS — Z0001 Encounter for general adult medical examination with abnormal findings: Secondary | ICD-10-CM | POA: Diagnosis not present

## 2016-01-27 DIAGNOSIS — E119 Type 2 diabetes mellitus without complications: Secondary | ICD-10-CM | POA: Diagnosis not present

## 2016-01-27 DIAGNOSIS — Z23 Encounter for immunization: Secondary | ICD-10-CM | POA: Diagnosis not present

## 2016-01-27 DIAGNOSIS — Z6836 Body mass index (BMI) 36.0-36.9, adult: Secondary | ICD-10-CM | POA: Diagnosis not present

## 2016-01-27 DIAGNOSIS — E782 Mixed hyperlipidemia: Secondary | ICD-10-CM | POA: Diagnosis not present

## 2016-01-27 DIAGNOSIS — N529 Male erectile dysfunction, unspecified: Secondary | ICD-10-CM | POA: Diagnosis not present

## 2016-03-15 DIAGNOSIS — N529 Male erectile dysfunction, unspecified: Secondary | ICD-10-CM | POA: Diagnosis not present

## 2016-03-15 DIAGNOSIS — I10 Essential (primary) hypertension: Secondary | ICD-10-CM | POA: Diagnosis not present

## 2016-03-15 DIAGNOSIS — K219 Gastro-esophageal reflux disease without esophagitis: Secondary | ICD-10-CM | POA: Diagnosis not present

## 2016-03-15 DIAGNOSIS — E119 Type 2 diabetes mellitus without complications: Secondary | ICD-10-CM | POA: Diagnosis not present

## 2016-03-15 DIAGNOSIS — M069 Rheumatoid arthritis, unspecified: Secondary | ICD-10-CM | POA: Diagnosis not present

## 2016-03-15 DIAGNOSIS — E782 Mixed hyperlipidemia: Secondary | ICD-10-CM | POA: Diagnosis not present

## 2016-04-06 DIAGNOSIS — M7989 Other specified soft tissue disorders: Secondary | ICD-10-CM | POA: Diagnosis not present

## 2016-04-06 DIAGNOSIS — M5136 Other intervertebral disc degeneration, lumbar region: Secondary | ICD-10-CM | POA: Diagnosis not present

## 2016-04-06 DIAGNOSIS — Z6836 Body mass index (BMI) 36.0-36.9, adult: Secondary | ICD-10-CM | POA: Diagnosis not present

## 2016-04-06 DIAGNOSIS — M2550 Pain in unspecified joint: Secondary | ICD-10-CM | POA: Diagnosis not present

## 2016-04-06 DIAGNOSIS — E669 Obesity, unspecified: Secondary | ICD-10-CM | POA: Diagnosis not present

## 2016-04-06 DIAGNOSIS — M0579 Rheumatoid arthritis with rheumatoid factor of multiple sites without organ or systems involvement: Secondary | ICD-10-CM | POA: Diagnosis not present

## 2016-04-11 DIAGNOSIS — S8002XA Contusion of left knee, initial encounter: Secondary | ICD-10-CM | POA: Diagnosis not present

## 2016-04-11 DIAGNOSIS — S0083XA Contusion of other part of head, initial encounter: Secondary | ICD-10-CM | POA: Diagnosis not present

## 2016-04-11 DIAGNOSIS — S5002XA Contusion of left elbow, initial encounter: Secondary | ICD-10-CM | POA: Diagnosis not present

## 2016-04-11 DIAGNOSIS — Z6837 Body mass index (BMI) 37.0-37.9, adult: Secondary | ICD-10-CM | POA: Diagnosis not present

## 2016-04-23 DIAGNOSIS — Z7984 Long term (current) use of oral hypoglycemic drugs: Secondary | ICD-10-CM | POA: Diagnosis not present

## 2016-04-23 DIAGNOSIS — K029 Dental caries, unspecified: Secondary | ICD-10-CM | POA: Diagnosis not present

## 2016-04-23 DIAGNOSIS — Z7982 Long term (current) use of aspirin: Secondary | ICD-10-CM | POA: Diagnosis not present

## 2016-04-23 DIAGNOSIS — K0889 Other specified disorders of teeth and supporting structures: Secondary | ICD-10-CM | POA: Diagnosis not present

## 2016-04-23 DIAGNOSIS — Z79899 Other long term (current) drug therapy: Secondary | ICD-10-CM | POA: Diagnosis not present

## 2016-04-23 DIAGNOSIS — M1990 Unspecified osteoarthritis, unspecified site: Secondary | ICD-10-CM | POA: Diagnosis not present

## 2016-04-23 DIAGNOSIS — E7800 Pure hypercholesterolemia, unspecified: Secondary | ICD-10-CM | POA: Diagnosis not present

## 2016-04-23 DIAGNOSIS — I10 Essential (primary) hypertension: Secondary | ICD-10-CM | POA: Diagnosis not present

## 2016-04-23 DIAGNOSIS — E119 Type 2 diabetes mellitus without complications: Secondary | ICD-10-CM | POA: Diagnosis not present

## 2016-06-24 ENCOUNTER — Encounter: Payer: Self-pay | Admitting: Podiatry

## 2016-06-24 ENCOUNTER — Ambulatory Visit (INDEPENDENT_AMBULATORY_CARE_PROVIDER_SITE_OTHER): Payer: Medicare HMO

## 2016-06-24 ENCOUNTER — Ambulatory Visit (INDEPENDENT_AMBULATORY_CARE_PROVIDER_SITE_OTHER): Payer: Medicare HMO | Admitting: Podiatry

## 2016-06-24 VITALS — Resp 16 | Ht 69.0 in | Wt 232.0 lb

## 2016-06-24 DIAGNOSIS — E1140 Type 2 diabetes mellitus with diabetic neuropathy, unspecified: Secondary | ICD-10-CM | POA: Diagnosis not present

## 2016-06-24 DIAGNOSIS — E1151 Type 2 diabetes mellitus with diabetic peripheral angiopathy without gangrene: Secondary | ICD-10-CM | POA: Diagnosis not present

## 2016-06-24 DIAGNOSIS — E1149 Type 2 diabetes mellitus with other diabetic neurological complication: Secondary | ICD-10-CM

## 2016-06-24 DIAGNOSIS — M79672 Pain in left foot: Secondary | ICD-10-CM | POA: Diagnosis not present

## 2016-06-24 DIAGNOSIS — M79671 Pain in right foot: Secondary | ICD-10-CM

## 2016-06-24 DIAGNOSIS — E11610 Type 2 diabetes mellitus with diabetic neuropathic arthropathy: Secondary | ICD-10-CM

## 2016-06-24 NOTE — Progress Notes (Signed)
   Subjective:    Patient ID: Jonathan [name], male    DOB: 28-Jan-1946, 71 y.o.   MRN: [id number]  HPI  Chief Complaint  Patient presents with  . Toe Pain    BL; Pt stated that "all of the toes are numb" x 10 years. Pt states that he used to be a truck driver and drove for 40 years, unsure if this is related to the numbness  . Hammer Toe    Left; 2nd toe, painful x 6-8 months.   . Diabetes    Type 1 Diabetic.        Review of Systems     Objective:   Physical Exam        Assessment & Plan:

## 2016-06-25 NOTE — Progress Notes (Signed)
Subjective:     Patient ID: Jonathan [name], male   DOB: 06/03/1945, 71 y.o.   MRN: [id number]  HPI patient presents stating that he's had long-term diabetes and has had a significant depression of his arch with discomfort present recently. He also has a lot of numbness in his forefoot but not a lot of tingling or burning currently   Review of Systems  All other systems reviewed and are negative.      Objective:   Physical Exam Neurovascular status was checked and found to be diminished as far as sharp dull and vibratory. Patient is noted to have moderate forefoot edema and collapse medial longitudinal arch with the navicular and talus being prominent in the plantar tissue bilateral. He does have mild diminishment of vascular status with diminishment of DP PT pulses and significant equinus condition    Assessment:     At risk diabetic with significant vascular neurological deficit depression and collapse of the midfoot bilateral with arthritic findings    Plan:     H&P x-rays reviewed and reviewed diabetic foot care and the fact this may be a progressive condition. I have recommended strongly diabetic shoes and we will get approval for him  X-rays indicated that there is significant midfoot collapse with arthritis occurring at the joint surfaces

## 2016-06-27 DIAGNOSIS — M069 Rheumatoid arthritis, unspecified: Secondary | ICD-10-CM | POA: Diagnosis not present

## 2016-06-27 DIAGNOSIS — E119 Type 2 diabetes mellitus without complications: Secondary | ICD-10-CM | POA: Diagnosis not present

## 2016-06-27 DIAGNOSIS — E782 Mixed hyperlipidemia: Secondary | ICD-10-CM | POA: Diagnosis not present

## 2016-06-27 DIAGNOSIS — Z6837 Body mass index (BMI) 37.0-37.9, adult: Secondary | ICD-10-CM | POA: Diagnosis not present

## 2016-06-27 DIAGNOSIS — M545 Low back pain: Secondary | ICD-10-CM | POA: Diagnosis not present

## 2016-06-27 DIAGNOSIS — I10 Essential (primary) hypertension: Secondary | ICD-10-CM | POA: Diagnosis not present

## 2016-07-13 DIAGNOSIS — K219 Gastro-esophageal reflux disease without esophagitis: Secondary | ICD-10-CM | POA: Diagnosis not present

## 2016-07-13 DIAGNOSIS — E782 Mixed hyperlipidemia: Secondary | ICD-10-CM | POA: Diagnosis not present

## 2016-07-13 DIAGNOSIS — I10 Essential (primary) hypertension: Secondary | ICD-10-CM | POA: Diagnosis not present

## 2016-07-13 DIAGNOSIS — E119 Type 2 diabetes mellitus without complications: Secondary | ICD-10-CM | POA: Diagnosis not present

## 2016-07-18 DIAGNOSIS — E119 Type 2 diabetes mellitus without complications: Secondary | ICD-10-CM | POA: Diagnosis not present

## 2016-07-18 DIAGNOSIS — M545 Low back pain: Secondary | ICD-10-CM | POA: Diagnosis not present

## 2016-07-18 DIAGNOSIS — Z6836 Body mass index (BMI) 36.0-36.9, adult: Secondary | ICD-10-CM | POA: Diagnosis not present

## 2016-07-18 DIAGNOSIS — E782 Mixed hyperlipidemia: Secondary | ICD-10-CM | POA: Diagnosis not present

## 2016-07-18 DIAGNOSIS — K219 Gastro-esophageal reflux disease without esophagitis: Secondary | ICD-10-CM | POA: Diagnosis not present

## 2016-07-18 DIAGNOSIS — I10 Essential (primary) hypertension: Secondary | ICD-10-CM | POA: Diagnosis not present

## 2016-07-18 DIAGNOSIS — M069 Rheumatoid arthritis, unspecified: Secondary | ICD-10-CM | POA: Diagnosis not present

## 2016-08-03 DIAGNOSIS — M545 Low back pain: Secondary | ICD-10-CM | POA: Diagnosis not present

## 2016-08-08 DIAGNOSIS — L249 Irritant contact dermatitis, unspecified cause: Secondary | ICD-10-CM | POA: Diagnosis not present

## 2016-08-08 DIAGNOSIS — Z6836 Body mass index (BMI) 36.0-36.9, adult: Secondary | ICD-10-CM | POA: Diagnosis not present

## 2016-09-08 DIAGNOSIS — M25531 Pain in right wrist: Secondary | ICD-10-CM | POA: Diagnosis not present

## 2016-09-08 DIAGNOSIS — M25571 Pain in right ankle and joints of right foot: Secondary | ICD-10-CM | POA: Diagnosis not present

## 2016-09-08 DIAGNOSIS — Z6836 Body mass index (BMI) 36.0-36.9, adult: Secondary | ICD-10-CM | POA: Diagnosis not present

## 2016-09-08 DIAGNOSIS — M069 Rheumatoid arthritis, unspecified: Secondary | ICD-10-CM | POA: Diagnosis not present

## 2016-09-08 DIAGNOSIS — M25532 Pain in left wrist: Secondary | ICD-10-CM | POA: Diagnosis not present

## 2016-09-14 ENCOUNTER — Encounter: Payer: Self-pay | Admitting: Podiatry

## 2016-09-14 ENCOUNTER — Ambulatory Visit (INDEPENDENT_AMBULATORY_CARE_PROVIDER_SITE_OTHER): Payer: Medicare HMO | Admitting: Podiatry

## 2016-09-14 DIAGNOSIS — E1151 Type 2 diabetes mellitus with diabetic peripheral angiopathy without gangrene: Secondary | ICD-10-CM

## 2016-09-14 DIAGNOSIS — E11610 Type 2 diabetes mellitus with diabetic neuropathic arthropathy: Secondary | ICD-10-CM

## 2016-09-14 DIAGNOSIS — E1149 Type 2 diabetes mellitus with other diabetic neurological complication: Secondary | ICD-10-CM

## 2016-09-14 DIAGNOSIS — E1140 Type 2 diabetes mellitus with diabetic neuropathy, unspecified: Secondary | ICD-10-CM | POA: Diagnosis not present

## 2016-09-14 NOTE — Progress Notes (Signed)
Subjective:    Patient ID: Jonathan [name], male   DOB: 71 y.o.   MRN: [id number]   HPI patient presents stating my arches continue to bother me they've collapsed I've got my long-term diabetes and I need to be casted for my diabetic shoes    ROS      Objective:  Physical Exam Neurovascular status diminished bilateral with significant collapse medial longitudinal arch with pressure against the navicular with pre-ulcerated of type keratotic tissue which form secondary to the pressure that's occurring against this area with long-term diabetes    Assessment:    At risk diabetic with neuropathy and structural loss     Plan:    H&P condition reviewed and recommended long-term diabetic shoes and went ahead casted today and can approval from her physician will be obtained

## 2016-09-15 DIAGNOSIS — M5136 Other intervertebral disc degeneration, lumbar region: Secondary | ICD-10-CM | POA: Diagnosis not present

## 2016-09-15 DIAGNOSIS — M0579 Rheumatoid arthritis with rheumatoid factor of multiple sites without organ or systems involvement: Secondary | ICD-10-CM | POA: Diagnosis not present

## 2016-09-15 DIAGNOSIS — Z6836 Body mass index (BMI) 36.0-36.9, adult: Secondary | ICD-10-CM | POA: Diagnosis not present

## 2016-09-15 DIAGNOSIS — E669 Obesity, unspecified: Secondary | ICD-10-CM | POA: Diagnosis not present

## 2016-09-15 DIAGNOSIS — R748 Abnormal levels of other serum enzymes: Secondary | ICD-10-CM | POA: Diagnosis not present

## 2016-10-11 DIAGNOSIS — Z6836 Body mass index (BMI) 36.0-36.9, adult: Secondary | ICD-10-CM | POA: Diagnosis not present

## 2016-10-11 DIAGNOSIS — M069 Rheumatoid arthritis, unspecified: Secondary | ICD-10-CM | POA: Diagnosis not present

## 2016-10-11 DIAGNOSIS — R600 Localized edema: Secondary | ICD-10-CM | POA: Diagnosis not present

## 2016-10-11 DIAGNOSIS — E119 Type 2 diabetes mellitus without complications: Secondary | ICD-10-CM | POA: Diagnosis not present

## 2016-10-11 DIAGNOSIS — E782 Mixed hyperlipidemia: Secondary | ICD-10-CM | POA: Diagnosis not present

## 2016-10-11 DIAGNOSIS — I10 Essential (primary) hypertension: Secondary | ICD-10-CM | POA: Diagnosis not present

## 2016-10-20 DIAGNOSIS — I10 Essential (primary) hypertension: Secondary | ICD-10-CM | POA: Diagnosis not present

## 2016-10-20 DIAGNOSIS — M069 Rheumatoid arthritis, unspecified: Secondary | ICD-10-CM | POA: Diagnosis not present

## 2016-10-20 DIAGNOSIS — Z6836 Body mass index (BMI) 36.0-36.9, adult: Secondary | ICD-10-CM | POA: Diagnosis not present

## 2016-10-20 DIAGNOSIS — M545 Low back pain: Secondary | ICD-10-CM | POA: Diagnosis not present

## 2016-10-20 DIAGNOSIS — E119 Type 2 diabetes mellitus without complications: Secondary | ICD-10-CM | POA: Diagnosis not present

## 2016-10-20 DIAGNOSIS — R296 Repeated falls: Secondary | ICD-10-CM | POA: Diagnosis not present

## 2016-10-26 ENCOUNTER — Ambulatory Visit (INDEPENDENT_AMBULATORY_CARE_PROVIDER_SITE_OTHER): Payer: Medicare HMO | Admitting: Orthotics

## 2016-10-26 DIAGNOSIS — E1140 Type 2 diabetes mellitus with diabetic neuropathy, unspecified: Secondary | ICD-10-CM | POA: Diagnosis not present

## 2016-10-26 DIAGNOSIS — E1149 Type 2 diabetes mellitus with other diabetic neurological complication: Secondary | ICD-10-CM | POA: Diagnosis not present

## 2016-10-26 DIAGNOSIS — E11610 Type 2 diabetes mellitus with diabetic neuropathic arthropathy: Secondary | ICD-10-CM | POA: Diagnosis not present

## 2016-10-31 NOTE — Progress Notes (Signed)
Patient came in today to pick up diabetic shoes and custom inserts.  Same was well pleased with fit and function.    Patient was able to ambulate without any discomfort. The foot ortheses offered full contact with plantar surface and contoured the arch well.   The shoes fit well with no heel slippage and areas of pressure concern.   Patient advised to contact us if any problems arise.

## 2017-01-04 DIAGNOSIS — Z6836 Body mass index (BMI) 36.0-36.9, adult: Secondary | ICD-10-CM | POA: Diagnosis not present

## 2017-01-04 DIAGNOSIS — L03115 Cellulitis of right lower limb: Secondary | ICD-10-CM | POA: Diagnosis not present

## 2017-01-10 DIAGNOSIS — H524 Presbyopia: Secondary | ICD-10-CM | POA: Diagnosis not present

## 2017-02-06 DIAGNOSIS — H9191 Unspecified hearing loss, right ear: Secondary | ICD-10-CM | POA: Diagnosis not present

## 2017-02-06 DIAGNOSIS — Z6836 Body mass index (BMI) 36.0-36.9, adult: Secondary | ICD-10-CM | POA: Diagnosis not present

## 2017-02-06 DIAGNOSIS — H6123 Impacted cerumen, bilateral: Secondary | ICD-10-CM | POA: Diagnosis not present

## 2017-02-27 DIAGNOSIS — G629 Polyneuropathy, unspecified: Secondary | ICD-10-CM | POA: Diagnosis not present

## 2017-02-27 DIAGNOSIS — E119 Type 2 diabetes mellitus without complications: Secondary | ICD-10-CM | POA: Diagnosis not present

## 2017-02-27 DIAGNOSIS — E7800 Pure hypercholesterolemia, unspecified: Secondary | ICD-10-CM | POA: Diagnosis not present

## 2017-02-27 DIAGNOSIS — I10 Essential (primary) hypertension: Secondary | ICD-10-CM | POA: Diagnosis not present

## 2017-02-27 DIAGNOSIS — M1990 Unspecified osteoarthritis, unspecified site: Secondary | ICD-10-CM | POA: Diagnosis not present

## 2017-02-27 DIAGNOSIS — Z7982 Long term (current) use of aspirin: Secondary | ICD-10-CM | POA: Diagnosis not present

## 2017-02-27 DIAGNOSIS — M79631 Pain in right forearm: Secondary | ICD-10-CM | POA: Diagnosis not present

## 2017-02-27 DIAGNOSIS — Z79899 Other long term (current) drug therapy: Secondary | ICD-10-CM | POA: Diagnosis not present

## 2017-02-27 DIAGNOSIS — M25521 Pain in right elbow: Secondary | ICD-10-CM | POA: Diagnosis not present

## 2017-02-27 DIAGNOSIS — M25511 Pain in right shoulder: Secondary | ICD-10-CM | POA: Diagnosis not present

## 2017-03-13 DIAGNOSIS — Z23 Encounter for immunization: Secondary | ICD-10-CM | POA: Diagnosis not present

## 2017-03-24 DIAGNOSIS — E782 Mixed hyperlipidemia: Secondary | ICD-10-CM | POA: Diagnosis not present

## 2017-03-24 DIAGNOSIS — E119 Type 2 diabetes mellitus without complications: Secondary | ICD-10-CM | POA: Diagnosis not present

## 2017-03-24 DIAGNOSIS — I10 Essential (primary) hypertension: Secondary | ICD-10-CM | POA: Diagnosis not present

## 2017-03-24 DIAGNOSIS — K219 Gastro-esophageal reflux disease without esophagitis: Secondary | ICD-10-CM | POA: Diagnosis not present

## 2017-03-24 DIAGNOSIS — M069 Rheumatoid arthritis, unspecified: Secondary | ICD-10-CM | POA: Diagnosis not present

## 2017-03-27 DIAGNOSIS — K5900 Constipation, unspecified: Secondary | ICD-10-CM | POA: Diagnosis not present

## 2017-03-27 DIAGNOSIS — M069 Rheumatoid arthritis, unspecified: Secondary | ICD-10-CM | POA: Diagnosis not present

## 2017-03-27 DIAGNOSIS — E119 Type 2 diabetes mellitus without complications: Secondary | ICD-10-CM | POA: Diagnosis not present

## 2017-03-27 DIAGNOSIS — K219 Gastro-esophageal reflux disease without esophagitis: Secondary | ICD-10-CM | POA: Diagnosis not present

## 2017-03-27 DIAGNOSIS — E782 Mixed hyperlipidemia: Secondary | ICD-10-CM | POA: Diagnosis not present

## 2017-03-27 DIAGNOSIS — Z0001 Encounter for general adult medical examination with abnormal findings: Secondary | ICD-10-CM | POA: Diagnosis not present

## 2017-03-27 DIAGNOSIS — I10 Essential (primary) hypertension: Secondary | ICD-10-CM | POA: Diagnosis not present

## 2017-03-27 DIAGNOSIS — J302 Other seasonal allergic rhinitis: Secondary | ICD-10-CM | POA: Diagnosis not present

## 2017-03-27 DIAGNOSIS — R296 Repeated falls: Secondary | ICD-10-CM | POA: Diagnosis not present

## 2017-06-07 DIAGNOSIS — Z6836 Body mass index (BMI) 36.0-36.9, adult: Secondary | ICD-10-CM | POA: Diagnosis not present

## 2017-06-07 DIAGNOSIS — J0101 Acute recurrent maxillary sinusitis: Secondary | ICD-10-CM | POA: Diagnosis not present

## 2017-06-29 DIAGNOSIS — R748 Abnormal levels of other serum enzymes: Secondary | ICD-10-CM | POA: Diagnosis not present

## 2017-06-29 DIAGNOSIS — Z6836 Body mass index (BMI) 36.0-36.9, adult: Secondary | ICD-10-CM | POA: Diagnosis not present

## 2017-06-29 DIAGNOSIS — E669 Obesity, unspecified: Secondary | ICD-10-CM | POA: Diagnosis not present

## 2017-06-29 DIAGNOSIS — M0579 Rheumatoid arthritis with rheumatoid factor of multiple sites without organ or systems involvement: Secondary | ICD-10-CM | POA: Diagnosis not present

## 2017-06-29 DIAGNOSIS — M5136 Other intervertebral disc degeneration, lumbar region: Secondary | ICD-10-CM | POA: Diagnosis not present

## 2017-07-05 DIAGNOSIS — K219 Gastro-esophageal reflux disease without esophagitis: Secondary | ICD-10-CM | POA: Diagnosis not present

## 2017-07-05 DIAGNOSIS — N401 Enlarged prostate with lower urinary tract symptoms: Secondary | ICD-10-CM | POA: Diagnosis not present

## 2017-07-05 DIAGNOSIS — I10 Essential (primary) hypertension: Secondary | ICD-10-CM | POA: Diagnosis not present

## 2017-07-05 DIAGNOSIS — J302 Other seasonal allergic rhinitis: Secondary | ICD-10-CM | POA: Diagnosis not present

## 2017-07-05 DIAGNOSIS — R05 Cough: Secondary | ICD-10-CM | POA: Diagnosis not present

## 2017-07-05 DIAGNOSIS — M069 Rheumatoid arthritis, unspecified: Secondary | ICD-10-CM | POA: Diagnosis not present

## 2017-07-05 DIAGNOSIS — E119 Type 2 diabetes mellitus without complications: Secondary | ICD-10-CM | POA: Diagnosis not present

## 2017-07-05 DIAGNOSIS — Z6834 Body mass index (BMI) 34.0-34.9, adult: Secondary | ICD-10-CM | POA: Diagnosis not present

## 2017-07-18 IMAGING — DX DG CHEST 2V
2 series · 2 of 2 positions shown · non-contrast
Comparison: CT chest and single view of the chest 09/05/2015. PA
and lateral chest 09/06/2011.

CLINICAL DATA: Cough for 3 weeks.

EXAM:
CHEST  2 VIEW

[chest pa]
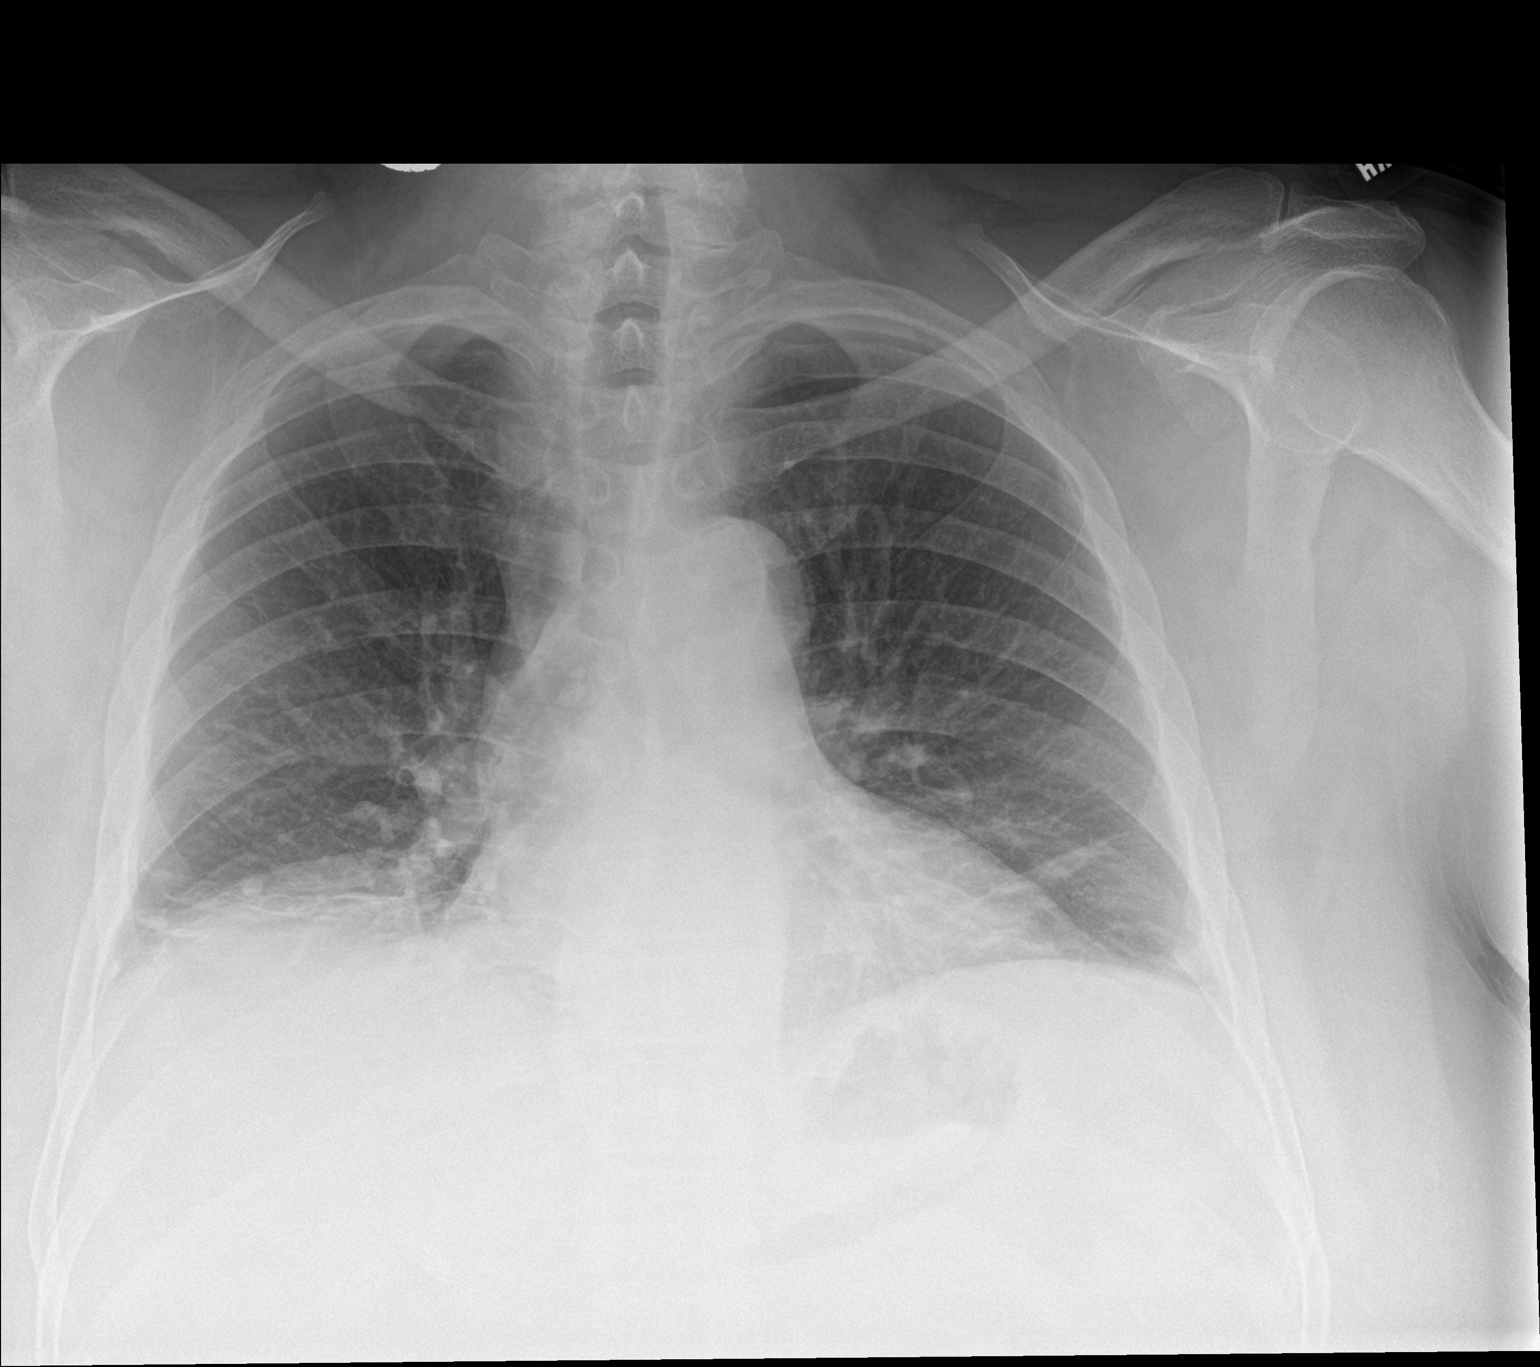

[chest lat]
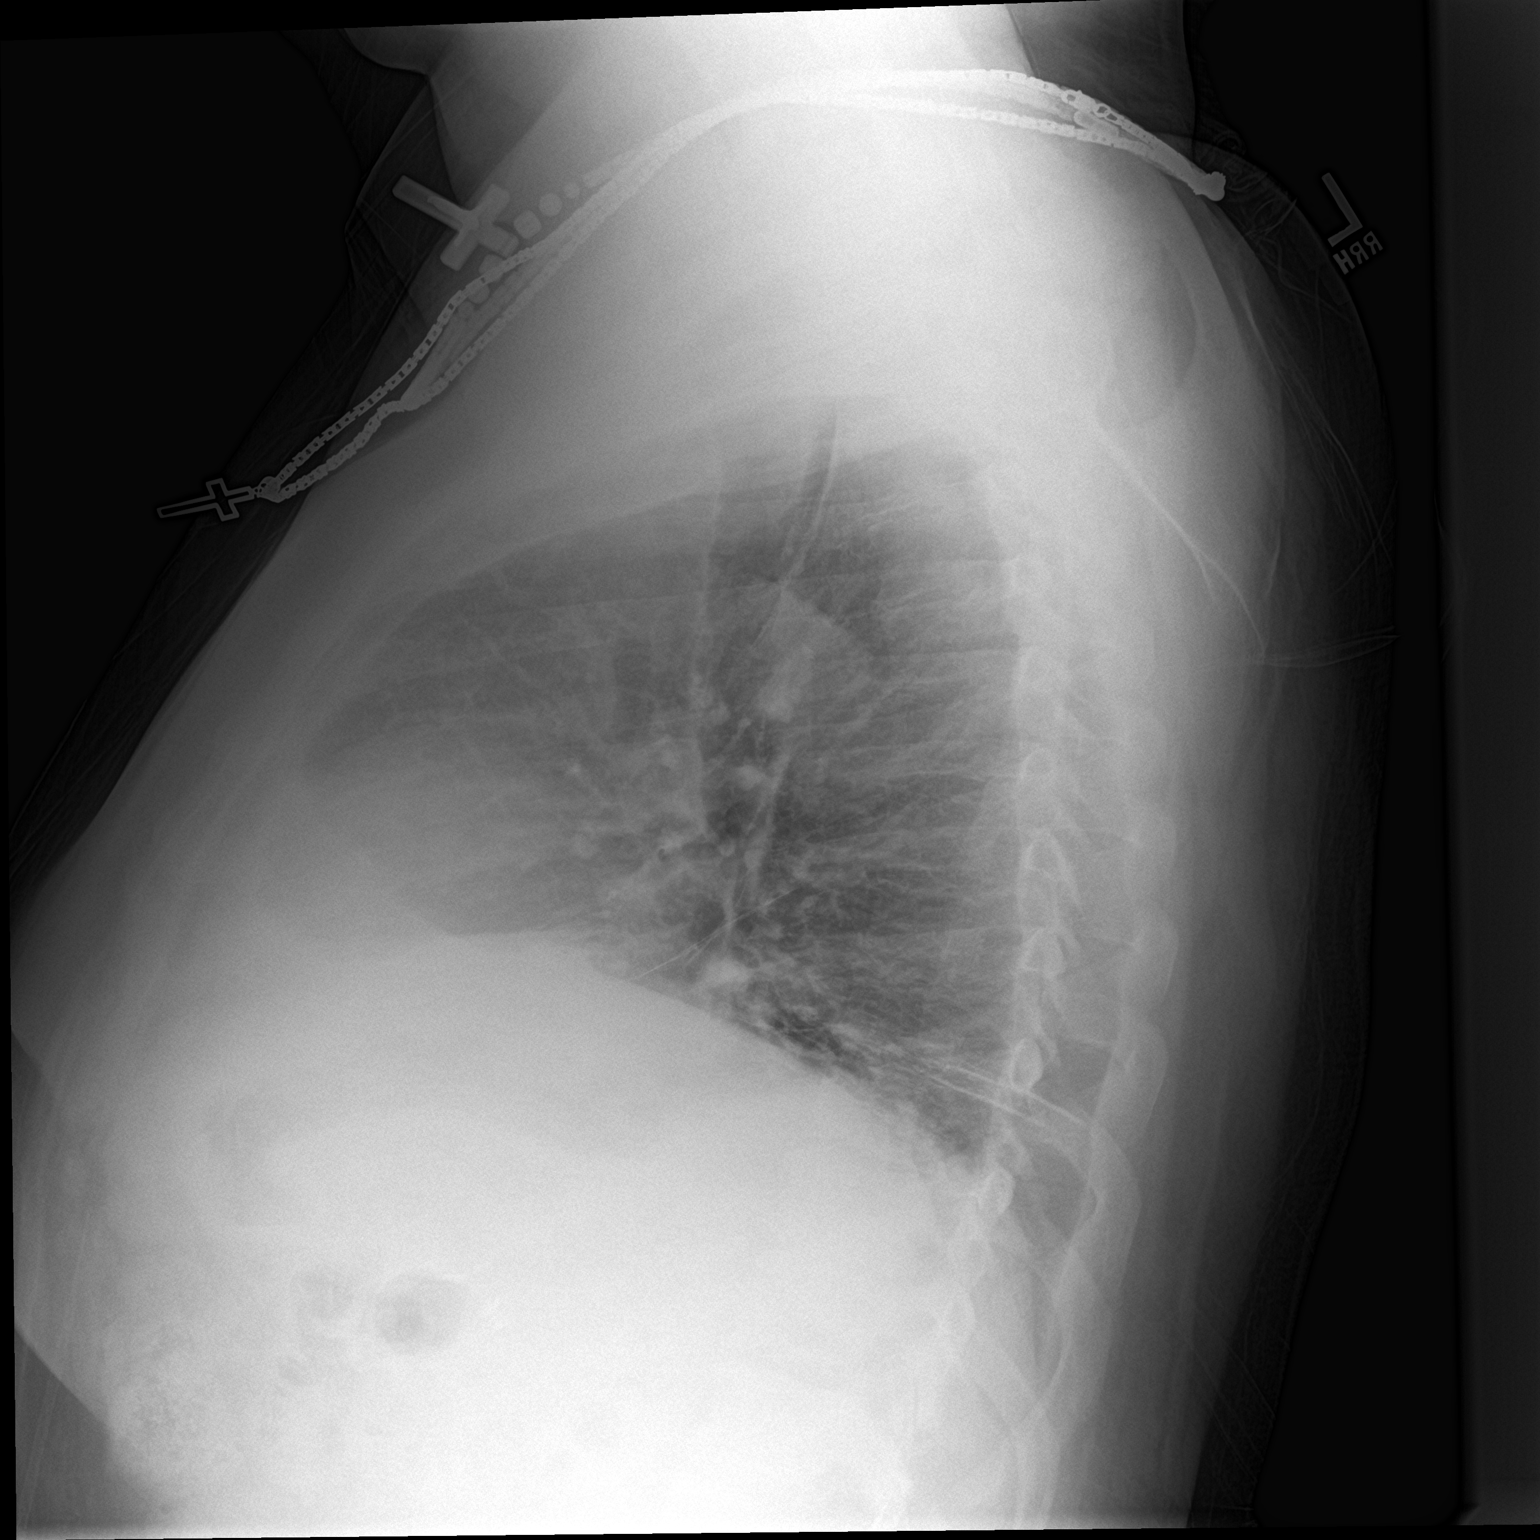

[2 of 2 positions shown; findings below may reference images not displayed]

FINDINGS: Lung volumes are somewhat low. Streaky bibasilar airspace opacities
are more notable on the right. No pneumothorax or pleural effusion.
Heart size is mildly enlarged.
IMPRESSION: Right greater than left streaky bibasilar airspace opacities are
likely due to atelectasis in this low volume chest. Pneumonia on the
right cannot be excluded.

## 2017-07-28 DIAGNOSIS — R3915 Urgency of urination: Secondary | ICD-10-CM | POA: Diagnosis not present

## 2017-07-28 DIAGNOSIS — N401 Enlarged prostate with lower urinary tract symptoms: Secondary | ICD-10-CM | POA: Diagnosis not present

## 2017-07-28 DIAGNOSIS — R350 Frequency of micturition: Secondary | ICD-10-CM | POA: Diagnosis not present

## 2017-07-28 DIAGNOSIS — R3914 Feeling of incomplete bladder emptying: Secondary | ICD-10-CM | POA: Diagnosis not present

## 2017-07-28 DIAGNOSIS — R8271 Bacteriuria: Secondary | ICD-10-CM | POA: Diagnosis not present

## 2017-09-11 DIAGNOSIS — E119 Type 2 diabetes mellitus without complications: Secondary | ICD-10-CM | POA: Diagnosis not present

## 2017-09-11 DIAGNOSIS — E782 Mixed hyperlipidemia: Secondary | ICD-10-CM | POA: Diagnosis not present

## 2017-09-11 DIAGNOSIS — K219 Gastro-esophageal reflux disease without esophagitis: Secondary | ICD-10-CM | POA: Diagnosis not present

## 2017-09-19 DIAGNOSIS — Z1331 Encounter for screening for depression: Secondary | ICD-10-CM | POA: Diagnosis not present

## 2017-09-19 DIAGNOSIS — Z6836 Body mass index (BMI) 36.0-36.9, adult: Secondary | ICD-10-CM | POA: Diagnosis not present

## 2017-09-19 DIAGNOSIS — M205X2 Other deformities of toe(s) (acquired), left foot: Secondary | ICD-10-CM | POA: Diagnosis not present

## 2017-09-19 DIAGNOSIS — E782 Mixed hyperlipidemia: Secondary | ICD-10-CM | POA: Diagnosis not present

## 2017-09-19 DIAGNOSIS — N401 Enlarged prostate with lower urinary tract symptoms: Secondary | ICD-10-CM | POA: Diagnosis not present

## 2017-09-19 DIAGNOSIS — E1143 Type 2 diabetes mellitus with diabetic autonomic (poly)neuropathy: Secondary | ICD-10-CM | POA: Diagnosis not present

## 2017-09-19 DIAGNOSIS — Z1389 Encounter for screening for other disorder: Secondary | ICD-10-CM | POA: Diagnosis not present

## 2017-09-19 DIAGNOSIS — I10 Essential (primary) hypertension: Secondary | ICD-10-CM | POA: Diagnosis not present

## 2017-09-28 DIAGNOSIS — H524 Presbyopia: Secondary | ICD-10-CM | POA: Diagnosis not present

## 2017-10-05 DIAGNOSIS — N401 Enlarged prostate with lower urinary tract symptoms: Secondary | ICD-10-CM | POA: Diagnosis not present

## 2017-10-05 DIAGNOSIS — R3915 Urgency of urination: Secondary | ICD-10-CM | POA: Diagnosis not present

## 2017-10-05 DIAGNOSIS — R8271 Bacteriuria: Secondary | ICD-10-CM | POA: Diagnosis not present

## 2017-10-05 DIAGNOSIS — R3914 Feeling of incomplete bladder emptying: Secondary | ICD-10-CM | POA: Diagnosis not present

## 2017-10-05 DIAGNOSIS — R350 Frequency of micturition: Secondary | ICD-10-CM | POA: Diagnosis not present

## 2017-10-09 DIAGNOSIS — E669 Obesity, unspecified: Secondary | ICD-10-CM | POA: Diagnosis not present

## 2017-10-09 DIAGNOSIS — Z6836 Body mass index (BMI) 36.0-36.9, adult: Secondary | ICD-10-CM | POA: Diagnosis not present

## 2017-10-09 DIAGNOSIS — R748 Abnormal levels of other serum enzymes: Secondary | ICD-10-CM | POA: Diagnosis not present

## 2017-10-09 DIAGNOSIS — M0579 Rheumatoid arthritis with rheumatoid factor of multiple sites without organ or systems involvement: Secondary | ICD-10-CM | POA: Diagnosis not present

## 2017-10-09 DIAGNOSIS — M5136 Other intervertebral disc degeneration, lumbar region: Secondary | ICD-10-CM | POA: Diagnosis not present

## 2017-10-12 DIAGNOSIS — M722 Plantar fascial fibromatosis: Secondary | ICD-10-CM | POA: Diagnosis not present

## 2017-10-12 DIAGNOSIS — M79671 Pain in right foot: Secondary | ICD-10-CM | POA: Diagnosis not present

## 2017-11-06 DIAGNOSIS — Z0100 Encounter for examination of eyes and vision without abnormal findings: Secondary | ICD-10-CM | POA: Diagnosis not present

## 2018-01-02 DIAGNOSIS — B353 Tinea pedis: Secondary | ICD-10-CM | POA: Diagnosis not present

## 2018-01-29 DIAGNOSIS — Z23 Encounter for immunization: Secondary | ICD-10-CM | POA: Diagnosis not present

## 2018-02-02 DIAGNOSIS — M069 Rheumatoid arthritis, unspecified: Secondary | ICD-10-CM | POA: Diagnosis not present

## 2018-02-02 DIAGNOSIS — Z6835 Body mass index (BMI) 35.0-35.9, adult: Secondary | ICD-10-CM | POA: Diagnosis not present

## 2018-02-02 DIAGNOSIS — E1143 Type 2 diabetes mellitus with diabetic autonomic (poly)neuropathy: Secondary | ICD-10-CM | POA: Diagnosis not present

## 2018-02-02 DIAGNOSIS — S40022A Contusion of left upper arm, initial encounter: Secondary | ICD-10-CM | POA: Diagnosis not present

## 2018-02-02 DIAGNOSIS — S40021A Contusion of right upper arm, initial encounter: Secondary | ICD-10-CM | POA: Diagnosis not present

## 2018-02-02 DIAGNOSIS — I4891 Unspecified atrial fibrillation: Secondary | ICD-10-CM | POA: Diagnosis not present

## 2018-02-12 DIAGNOSIS — E669 Obesity, unspecified: Secondary | ICD-10-CM | POA: Diagnosis not present

## 2018-02-12 DIAGNOSIS — M0579 Rheumatoid arthritis with rheumatoid factor of multiple sites without organ or systems involvement: Secondary | ICD-10-CM | POA: Diagnosis not present

## 2018-02-12 DIAGNOSIS — Z6835 Body mass index (BMI) 35.0-35.9, adult: Secondary | ICD-10-CM | POA: Diagnosis not present

## 2018-02-12 DIAGNOSIS — M5136 Other intervertebral disc degeneration, lumbar region: Secondary | ICD-10-CM | POA: Diagnosis not present

## 2018-02-12 DIAGNOSIS — R748 Abnormal levels of other serum enzymes: Secondary | ICD-10-CM | POA: Diagnosis not present

## 2018-02-13 DIAGNOSIS — Z6835 Body mass index (BMI) 35.0-35.9, adult: Secondary | ICD-10-CM | POA: Diagnosis not present

## 2018-02-13 DIAGNOSIS — M069 Rheumatoid arthritis, unspecified: Secondary | ICD-10-CM | POA: Diagnosis not present

## 2018-02-13 DIAGNOSIS — M5136 Other intervertebral disc degeneration, lumbar region: Secondary | ICD-10-CM | POA: Diagnosis not present

## 2018-02-13 DIAGNOSIS — M545 Low back pain: Secondary | ICD-10-CM | POA: Diagnosis not present

## 2018-02-13 DIAGNOSIS — M5416 Radiculopathy, lumbar region: Secondary | ICD-10-CM | POA: Diagnosis not present

## 2018-02-19 DIAGNOSIS — M4726 Other spondylosis with radiculopathy, lumbar region: Secondary | ICD-10-CM | POA: Diagnosis not present

## 2018-02-19 DIAGNOSIS — M5416 Radiculopathy, lumbar region: Secondary | ICD-10-CM | POA: Diagnosis not present

## 2018-02-19 DIAGNOSIS — M545 Low back pain: Secondary | ICD-10-CM | POA: Diagnosis not present

## 2018-02-19 DIAGNOSIS — M47816 Spondylosis without myelopathy or radiculopathy, lumbar region: Secondary | ICD-10-CM | POA: Diagnosis not present

## 2018-03-27 DIAGNOSIS — K219 Gastro-esophageal reflux disease without esophagitis: Secondary | ICD-10-CM | POA: Diagnosis not present

## 2018-03-27 DIAGNOSIS — N400 Enlarged prostate without lower urinary tract symptoms: Secondary | ICD-10-CM | POA: Diagnosis not present

## 2018-03-27 DIAGNOSIS — M069 Rheumatoid arthritis, unspecified: Secondary | ICD-10-CM | POA: Diagnosis not present

## 2018-03-27 DIAGNOSIS — E782 Mixed hyperlipidemia: Secondary | ICD-10-CM | POA: Diagnosis not present

## 2018-03-27 DIAGNOSIS — E1143 Type 2 diabetes mellitus with diabetic autonomic (poly)neuropathy: Secondary | ICD-10-CM | POA: Diagnosis not present

## 2018-03-27 DIAGNOSIS — I10 Essential (primary) hypertension: Secondary | ICD-10-CM | POA: Diagnosis not present

## 2018-04-19 DIAGNOSIS — M069 Rheumatoid arthritis, unspecified: Secondary | ICD-10-CM | POA: Diagnosis not present

## 2018-04-19 DIAGNOSIS — I10 Essential (primary) hypertension: Secondary | ICD-10-CM | POA: Diagnosis not present

## 2018-04-19 DIAGNOSIS — M21612 Bunion of left foot: Secondary | ICD-10-CM | POA: Diagnosis not present

## 2018-04-19 DIAGNOSIS — E1143 Type 2 diabetes mellitus with diabetic autonomic (poly)neuropathy: Secondary | ICD-10-CM | POA: Diagnosis not present

## 2018-04-19 DIAGNOSIS — Z6835 Body mass index (BMI) 35.0-35.9, adult: Secondary | ICD-10-CM | POA: Diagnosis not present

## 2018-04-19 DIAGNOSIS — M21611 Bunion of right foot: Secondary | ICD-10-CM | POA: Diagnosis not present

## 2018-04-19 DIAGNOSIS — Z0001 Encounter for general adult medical examination with abnormal findings: Secondary | ICD-10-CM | POA: Diagnosis not present

## 2018-04-19 DIAGNOSIS — Z23 Encounter for immunization: Secondary | ICD-10-CM | POA: Diagnosis not present

## 2018-05-15 DIAGNOSIS — M0579 Rheumatoid arthritis with rheumatoid factor of multiple sites without organ or systems involvement: Secondary | ICD-10-CM | POA: Diagnosis not present

## 2018-05-15 DIAGNOSIS — Z6835 Body mass index (BMI) 35.0-35.9, adult: Secondary | ICD-10-CM | POA: Diagnosis not present

## 2018-05-15 DIAGNOSIS — R748 Abnormal levels of other serum enzymes: Secondary | ICD-10-CM | POA: Diagnosis not present

## 2018-05-15 DIAGNOSIS — M5136 Other intervertebral disc degeneration, lumbar region: Secondary | ICD-10-CM | POA: Diagnosis not present

## 2018-05-15 DIAGNOSIS — E669 Obesity, unspecified: Secondary | ICD-10-CM | POA: Diagnosis not present

## 2018-06-13 DIAGNOSIS — M545 Low back pain: Secondary | ICD-10-CM | POA: Diagnosis not present

## 2018-06-13 DIAGNOSIS — M5416 Radiculopathy, lumbar region: Secondary | ICD-10-CM | POA: Diagnosis not present

## 2018-06-13 DIAGNOSIS — M069 Rheumatoid arthritis, unspecified: Secondary | ICD-10-CM | POA: Diagnosis not present

## 2018-06-13 DIAGNOSIS — M5136 Other intervertebral disc degeneration, lumbar region: Secondary | ICD-10-CM | POA: Diagnosis not present

## 2018-06-13 DIAGNOSIS — I10 Essential (primary) hypertension: Secondary | ICD-10-CM | POA: Diagnosis not present

## 2018-06-13 DIAGNOSIS — Z6835 Body mass index (BMI) 35.0-35.9, adult: Secondary | ICD-10-CM | POA: Diagnosis not present

## 2018-06-21 DIAGNOSIS — M79605 Pain in left leg: Secondary | ICD-10-CM | POA: Diagnosis not present

## 2018-06-21 DIAGNOSIS — S0990XA Unspecified injury of head, initial encounter: Secondary | ICD-10-CM | POA: Diagnosis not present

## 2018-06-21 DIAGNOSIS — S79922A Unspecified injury of left thigh, initial encounter: Secondary | ICD-10-CM | POA: Diagnosis not present

## 2018-06-21 DIAGNOSIS — S0993XA Unspecified injury of face, initial encounter: Secondary | ICD-10-CM | POA: Diagnosis not present

## 2018-06-21 DIAGNOSIS — S8992XA Unspecified injury of left lower leg, initial encounter: Secondary | ICD-10-CM | POA: Diagnosis not present

## 2018-06-21 DIAGNOSIS — W010XXA Fall on same level from slipping, tripping and stumbling without subsequent striking against object, initial encounter: Secondary | ICD-10-CM | POA: Diagnosis not present

## 2018-06-21 DIAGNOSIS — R220 Localized swelling, mass and lump, head: Secondary | ICD-10-CM | POA: Diagnosis not present

## 2018-06-21 DIAGNOSIS — M25552 Pain in left hip: Secondary | ICD-10-CM | POA: Diagnosis not present

## 2018-06-21 DIAGNOSIS — H5711 Ocular pain, right eye: Secondary | ICD-10-CM | POA: Diagnosis not present

## 2018-06-21 DIAGNOSIS — R0781 Pleurodynia: Secondary | ICD-10-CM | POA: Diagnosis not present

## 2018-06-21 DIAGNOSIS — S79912A Unspecified injury of left hip, initial encounter: Secondary | ICD-10-CM | POA: Diagnosis not present

## 2018-06-21 DIAGNOSIS — S299XXA Unspecified injury of thorax, initial encounter: Secondary | ICD-10-CM | POA: Diagnosis not present

## 2018-06-21 DIAGNOSIS — M25562 Pain in left knee: Secondary | ICD-10-CM | POA: Diagnosis not present

## 2018-08-27 DIAGNOSIS — M0579 Rheumatoid arthritis with rheumatoid factor of multiple sites without organ or systems involvement: Secondary | ICD-10-CM | POA: Diagnosis not present

## 2018-08-27 DIAGNOSIS — Z79899 Other long term (current) drug therapy: Secondary | ICD-10-CM | POA: Diagnosis not present

## 2018-08-27 DIAGNOSIS — R748 Abnormal levels of other serum enzymes: Secondary | ICD-10-CM | POA: Diagnosis not present

## 2018-10-11 DIAGNOSIS — B351 Tinea unguium: Secondary | ICD-10-CM | POA: Diagnosis not present

## 2018-10-11 DIAGNOSIS — E1142 Type 2 diabetes mellitus with diabetic polyneuropathy: Secondary | ICD-10-CM | POA: Diagnosis not present

## 2018-11-01 DIAGNOSIS — J342 Deviated nasal septum: Secondary | ICD-10-CM | POA: Diagnosis not present

## 2018-11-01 DIAGNOSIS — H903 Sensorineural hearing loss, bilateral: Secondary | ICD-10-CM | POA: Diagnosis not present

## 2018-11-01 DIAGNOSIS — K219 Gastro-esophageal reflux disease without esophagitis: Secondary | ICD-10-CM | POA: Diagnosis not present

## 2018-11-12 DIAGNOSIS — I10 Essential (primary) hypertension: Secondary | ICD-10-CM | POA: Diagnosis not present

## 2018-11-12 DIAGNOSIS — E1143 Type 2 diabetes mellitus with diabetic autonomic (poly)neuropathy: Secondary | ICD-10-CM | POA: Diagnosis not present

## 2018-11-12 DIAGNOSIS — R600 Localized edema: Secondary | ICD-10-CM | POA: Diagnosis not present

## 2018-11-12 DIAGNOSIS — M5136 Other intervertebral disc degeneration, lumbar region: Secondary | ICD-10-CM | POA: Diagnosis not present

## 2018-11-12 DIAGNOSIS — M069 Rheumatoid arthritis, unspecified: Secondary | ICD-10-CM | POA: Diagnosis not present

## 2018-11-15 DIAGNOSIS — R600 Localized edema: Secondary | ICD-10-CM | POA: Diagnosis not present

## 2018-11-15 DIAGNOSIS — I700 Atherosclerosis of aorta: Secondary | ICD-10-CM | POA: Diagnosis not present

## 2018-11-15 DIAGNOSIS — E1143 Type 2 diabetes mellitus with diabetic autonomic (poly)neuropathy: Secondary | ICD-10-CM | POA: Diagnosis not present

## 2018-11-22 DIAGNOSIS — M5136 Other intervertebral disc degeneration, lumbar region: Secondary | ICD-10-CM | POA: Diagnosis not present

## 2018-11-22 DIAGNOSIS — M0579 Rheumatoid arthritis with rheumatoid factor of multiple sites without organ or systems involvement: Secondary | ICD-10-CM | POA: Diagnosis not present

## 2018-11-22 DIAGNOSIS — R748 Abnormal levels of other serum enzymes: Secondary | ICD-10-CM | POA: Diagnosis not present

## 2018-12-03 DIAGNOSIS — R0602 Shortness of breath: Secondary | ICD-10-CM | POA: Diagnosis not present

## 2018-12-03 DIAGNOSIS — J3089 Other allergic rhinitis: Secondary | ICD-10-CM | POA: Diagnosis not present

## 2018-12-03 DIAGNOSIS — Z79899 Other long term (current) drug therapy: Secondary | ICD-10-CM | POA: Diagnosis not present

## 2018-12-03 DIAGNOSIS — K219 Gastro-esophageal reflux disease without esophagitis: Secondary | ICD-10-CM | POA: Diagnosis not present

## 2018-12-03 DIAGNOSIS — E119 Type 2 diabetes mellitus without complications: Secondary | ICD-10-CM | POA: Diagnosis not present

## 2018-12-03 DIAGNOSIS — I10 Essential (primary) hypertension: Secondary | ICD-10-CM | POA: Diagnosis not present

## 2018-12-03 DIAGNOSIS — M069 Rheumatoid arthritis, unspecified: Secondary | ICD-10-CM | POA: Diagnosis not present

## 2018-12-13 DIAGNOSIS — J342 Deviated nasal septum: Secondary | ICD-10-CM | POA: Diagnosis not present

## 2018-12-13 DIAGNOSIS — K219 Gastro-esophageal reflux disease without esophagitis: Secondary | ICD-10-CM | POA: Diagnosis not present

## 2018-12-13 DIAGNOSIS — H903 Sensorineural hearing loss, bilateral: Secondary | ICD-10-CM | POA: Diagnosis not present

## 2019-01-08 DIAGNOSIS — E1142 Type 2 diabetes mellitus with diabetic polyneuropathy: Secondary | ICD-10-CM | POA: Diagnosis not present

## 2019-01-08 DIAGNOSIS — B351 Tinea unguium: Secondary | ICD-10-CM | POA: Diagnosis not present

## 2019-01-08 DIAGNOSIS — L84 Corns and callosities: Secondary | ICD-10-CM | POA: Diagnosis not present

## 2019-01-08 DIAGNOSIS — M79676 Pain in unspecified toe(s): Secondary | ICD-10-CM | POA: Diagnosis not present

## 2019-01-30 DIAGNOSIS — R748 Abnormal levels of other serum enzymes: Secondary | ICD-10-CM | POA: Diagnosis not present

## 2019-01-30 DIAGNOSIS — M0579 Rheumatoid arthritis with rheumatoid factor of multiple sites without organ or systems involvement: Secondary | ICD-10-CM | POA: Diagnosis not present

## 2019-01-30 DIAGNOSIS — M5136 Other intervertebral disc degeneration, lumbar region: Secondary | ICD-10-CM | POA: Diagnosis not present

## 2019-01-30 DIAGNOSIS — R21 Rash and other nonspecific skin eruption: Secondary | ICD-10-CM | POA: Diagnosis not present

## 2019-02-07 DIAGNOSIS — S80862A Insect bite (nonvenomous), left lower leg, initial encounter: Secondary | ICD-10-CM | POA: Diagnosis not present

## 2019-02-07 DIAGNOSIS — S70362A Insect bite (nonvenomous), left thigh, initial encounter: Secondary | ICD-10-CM | POA: Diagnosis not present

## 2019-02-08 DIAGNOSIS — R3912 Poor urinary stream: Secondary | ICD-10-CM | POA: Diagnosis not present

## 2019-02-08 DIAGNOSIS — N3000 Acute cystitis without hematuria: Secondary | ICD-10-CM | POA: Diagnosis not present

## 2019-02-08 DIAGNOSIS — R8279 Other abnormal findings on microbiological examination of urine: Secondary | ICD-10-CM | POA: Diagnosis not present

## 2019-02-20 DIAGNOSIS — H35033 Hypertensive retinopathy, bilateral: Secondary | ICD-10-CM | POA: Diagnosis not present

## 2019-03-06 DIAGNOSIS — M069 Rheumatoid arthritis, unspecified: Secondary | ICD-10-CM | POA: Diagnosis not present

## 2019-03-06 DIAGNOSIS — I10 Essential (primary) hypertension: Secondary | ICD-10-CM | POA: Diagnosis not present

## 2019-03-06 DIAGNOSIS — E1121 Type 2 diabetes mellitus with diabetic nephropathy: Secondary | ICD-10-CM | POA: Diagnosis not present

## 2019-03-06 DIAGNOSIS — Z0000 Encounter for general adult medical examination without abnormal findings: Secondary | ICD-10-CM | POA: Diagnosis not present

## 2019-03-06 DIAGNOSIS — J3089 Other allergic rhinitis: Secondary | ICD-10-CM | POA: Diagnosis not present

## 2019-03-06 DIAGNOSIS — K219 Gastro-esophageal reflux disease without esophagitis: Secondary | ICD-10-CM | POA: Diagnosis not present

## 2019-03-18 ENCOUNTER — Other Ambulatory Visit: Payer: Self-pay

## 2019-03-18 NOTE — Patient Outreach (Signed)
[facility]) Care Management  03/18/2019  TADASHI SHERBURNE 1946-03-11 [id number]   Medication Adherence call to Mr. Chief Perreault HIPPA Compliant Voice message left with a call back number.Mr. Moseng is showing past due on Losartan 100 mg under [place].   [facility] Management Direct Dial [phone]  Fax [phone] [email or web address]

## 2019-03-21 DIAGNOSIS — R3914 Feeling of incomplete bladder emptying: Secondary | ICD-10-CM | POA: Diagnosis not present

## 2019-03-21 DIAGNOSIS — H8110 Benign paroxysmal vertigo, unspecified ear: Secondary | ICD-10-CM | POA: Diagnosis not present

## 2019-03-26 ENCOUNTER — Other Ambulatory Visit: Payer: Self-pay | Admitting: Urology

## 2019-04-09 DIAGNOSIS — R748 Abnormal levels of other serum enzymes: Secondary | ICD-10-CM | POA: Diagnosis not present

## 2019-04-09 DIAGNOSIS — M5136 Other intervertebral disc degeneration, lumbar region: Secondary | ICD-10-CM | POA: Diagnosis not present

## 2019-04-09 DIAGNOSIS — M0579 Rheumatoid arthritis with rheumatoid factor of multiple sites without organ or systems involvement: Secondary | ICD-10-CM | POA: Diagnosis not present

## 2019-04-11 DIAGNOSIS — N182 Chronic kidney disease, stage 2 (mild): Secondary | ICD-10-CM | POA: Diagnosis not present

## 2019-04-11 DIAGNOSIS — E119 Type 2 diabetes mellitus without complications: Secondary | ICD-10-CM | POA: Diagnosis not present

## 2019-04-11 DIAGNOSIS — I10 Essential (primary) hypertension: Secondary | ICD-10-CM | POA: Diagnosis not present

## 2019-04-11 DIAGNOSIS — M069 Rheumatoid arthritis, unspecified: Secondary | ICD-10-CM | POA: Diagnosis not present

## 2019-04-12 ENCOUNTER — Encounter (HOSPITAL_BASED_OUTPATIENT_CLINIC_OR_DEPARTMENT_OTHER): Payer: Self-pay | Admitting: Urology

## 2019-04-12 ENCOUNTER — Other Ambulatory Visit: Payer: Self-pay

## 2019-04-12 NOTE — Progress Notes (Signed)
Spoke w/ via phone for pre-op interview---Jonathan [name] needs dos----   I stat 8, ekg       [name] results------ COVID test ------04-13-2019 Arrive at ------- NPO after ------830 am Medications to take morning of surgery -----metoprolol succinate, omeprazole, tamsulosin Diabetic medication -----take none Patient Special Instructions ----- Pre-Op special Istructions ----- Patient verbalized understanding of instructions that were given at this phone interview. Patient denies shortness of breath, chest pain, fever, cough a this phone interview.

## 2019-04-13 ENCOUNTER — Other Ambulatory Visit (HOSPITAL_COMMUNITY)
Admission: RE | Admit: 2019-04-13 | Discharge: 2019-04-13 | Disposition: A | Discharge status: Home / Self Care | Payer: Medicare Other | Source: Ambulatory Visit | Attending: Urology | Admitting: Urology

## 2019-04-13 DIAGNOSIS — Z20828 Contact with and (suspected) exposure to other viral communicable diseases: Secondary | ICD-10-CM | POA: Insufficient documentation

## 2019-04-13 DIAGNOSIS — Z01812 Encounter for preprocedural laboratory examination: Secondary | ICD-10-CM | POA: Insufficient documentation

## 2019-04-14 LAB — NOVEL CORONAVIRUS, NAA (HOSP ORDER, SEND-OUT TO REF LAB; TAT 18-24 HRS): SARS-CoV-2, NAA: NOT DETECTED

## 2019-04-17 ENCOUNTER — Ambulatory Visit (HOSPITAL_BASED_OUTPATIENT_CLINIC_OR_DEPARTMENT_OTHER)
Admission: RE | Admit: 2019-04-17 | Discharge: 2019-04-17 | Disposition: A | Discharge status: Home / Self Care | Payer: Medicare Other | Attending: Urology | Admitting: Urology

## 2019-04-17 ENCOUNTER — Ambulatory Visit (HOSPITAL_BASED_OUTPATIENT_CLINIC_OR_DEPARTMENT_OTHER): Payer: Medicare Other | Admitting: Certified Registered"

## 2019-04-17 ENCOUNTER — Encounter (HOSPITAL_BASED_OUTPATIENT_CLINIC_OR_DEPARTMENT_OTHER)
Admission: RE | Disposition: A | Discharge status: Home / Self Care | Payer: Self-pay | Source: Home / Self Care | Attending: Urology

## 2019-04-17 ENCOUNTER — Encounter (HOSPITAL_BASED_OUTPATIENT_CLINIC_OR_DEPARTMENT_OTHER): Payer: Self-pay | Admitting: Urology

## 2019-04-17 ENCOUNTER — Other Ambulatory Visit: Payer: Self-pay

## 2019-04-17 DIAGNOSIS — Z79899 Other long term (current) drug therapy: Secondary | ICD-10-CM | POA: Insufficient documentation

## 2019-04-17 DIAGNOSIS — Z7984 Long term (current) use of oral hypoglycemic drugs: Secondary | ICD-10-CM | POA: Insufficient documentation

## 2019-04-17 DIAGNOSIS — R3916 Straining to void: Secondary | ICD-10-CM | POA: Diagnosis not present

## 2019-04-17 DIAGNOSIS — E785 Hyperlipidemia, unspecified: Secondary | ICD-10-CM | POA: Diagnosis not present

## 2019-04-17 DIAGNOSIS — R3914 Feeling of incomplete bladder emptying: Secondary | ICD-10-CM | POA: Diagnosis not present

## 2019-04-17 DIAGNOSIS — N3289 Other specified disorders of bladder: Secondary | ICD-10-CM | POA: Diagnosis not present

## 2019-04-17 DIAGNOSIS — N323 Diverticulum of bladder: Secondary | ICD-10-CM | POA: Insufficient documentation

## 2019-04-17 DIAGNOSIS — N401 Enlarged prostate with lower urinary tract symptoms: Secondary | ICD-10-CM | POA: Diagnosis not present

## 2019-04-17 DIAGNOSIS — M1990 Unspecified osteoarthritis, unspecified site: Secondary | ICD-10-CM | POA: Insufficient documentation

## 2019-04-17 DIAGNOSIS — K219 Gastro-esophageal reflux disease without esophagitis: Secondary | ICD-10-CM | POA: Diagnosis not present

## 2019-04-17 DIAGNOSIS — E119 Type 2 diabetes mellitus without complications: Secondary | ICD-10-CM | POA: Diagnosis not present

## 2019-04-17 DIAGNOSIS — N35919 Unspecified urethral stricture, male, unspecified site: Secondary | ICD-10-CM | POA: Diagnosis not present

## 2019-04-17 DIAGNOSIS — I10 Essential (primary) hypertension: Secondary | ICD-10-CM | POA: Insufficient documentation

## 2019-04-17 DIAGNOSIS — R3912 Poor urinary stream: Secondary | ICD-10-CM | POA: Insufficient documentation

## 2019-04-17 DIAGNOSIS — N35913 Unspecified membranous urethral stricture, male: Secondary | ICD-10-CM | POA: Diagnosis not present

## 2019-04-17 DIAGNOSIS — Z7982 Long term (current) use of aspirin: Secondary | ICD-10-CM | POA: Insufficient documentation

## 2019-04-17 DIAGNOSIS — N35813 Other membranous urethral stricture, male: Secondary | ICD-10-CM | POA: Diagnosis not present

## 2019-04-17 DIAGNOSIS — G709 Myoneural disorder, unspecified: Secondary | ICD-10-CM | POA: Insufficient documentation

## 2019-04-17 HISTORY — DX: Unspecified urethral stricture, male, unspecified site: N35.919

## 2019-04-17 HISTORY — PX: CYSTOSCOPY WITH URETHRAL DILATATION: SHX5125

## 2019-04-17 LAB — POCT I-STAT, CHEM 8
BUN: 23 mg/dL (ref 8–23)
Calcium, Ion: 1.22 mmol/L (ref 1.15–1.40)
Chloride: 103 mmol/L (ref 98–111)
Creatinine, Ser: 1.3 mg/dL — ABNORMAL HIGH (ref 0.61–1.24)
Glucose, Bld: 126 mg/dL — ABNORMAL HIGH (ref 70–99)
HCT: 49 % (ref 39.0–52.0)
Hemoglobin: 16.7 g/dL (ref 13.0–17.0)
Potassium: 4.2 mmol/L (ref 3.5–5.1)
Sodium: 140 mmol/L (ref 135–145)
TCO2: 30 mmol/L (ref 22–32)

## 2019-04-17 LAB — GLUCOSE, CAPILLARY: Glucose-Capillary: 119 mg/dL — ABNORMAL HIGH (ref 70–99)

## 2019-04-17 SURGERY — CYSTOSCOPY, WITH URETHRAL DILATION
Anesthesia: General

## 2019-04-17 MED ORDER — PROPOFOL 10 MG/ML IV BOLUS
INTRAVENOUS | Status: DC | PRN
  Administered 2019-04-17: 160 mg via INTRAVENOUS

## 2019-04-17 MED ORDER — PHENAZOPYRIDINE HCL 200 MG PO TABS: 200.0000 mg | ORAL_TABLET | Freq: Three times a day (TID) | ORAL | 0 refills | Status: AC | PRN

## 2019-04-17 MED ORDER — STERILE WATER FOR IRRIGATION IR SOLN
Status: DC | PRN
  Administered 2019-04-17: 3000 mL

## 2019-04-17 MED ORDER — DEXAMETHASONE SODIUM PHOSPHATE 10 MG/ML IJ SOLN
INTRAMUSCULAR | Status: AC
  Filled 2019-04-17: qty 1

## 2019-04-17 MED ORDER — ONDANSETRON HCL 4 MG/2ML IJ SOLN
INTRAMUSCULAR | Status: AC
  Filled 2019-04-17: qty 2

## 2019-04-17 MED ORDER — ONDANSETRON HCL 4 MG/2ML IJ SOLN
4.0000 mg | Freq: Four times a day (QID) | INTRAMUSCULAR | Status: DC | PRN
  Filled 2019-04-17: qty 2

## 2019-04-17 MED ORDER — LIDOCAINE 2% (20 MG/ML) 5 ML SYRINGE
INTRAMUSCULAR | Status: AC
  Filled 2019-04-17: qty 5

## 2019-04-17 MED ORDER — FENTANYL CITRATE (PF) 100 MCG/2ML IJ SOLN
INTRAMUSCULAR | Status: AC
  Filled 2019-04-17: qty 2

## 2019-04-17 MED ORDER — IOHEXOL 300 MG/ML  SOLN
INTRAMUSCULAR | Status: DC | PRN
  Administered 2019-04-17: 10:00:00 20 mL via URETHRAL

## 2019-04-17 MED ORDER — ONDANSETRON HCL 4 MG/2ML IJ SOLN
INTRAMUSCULAR | Status: DC | PRN
  Administered 2019-04-17: 4 mg via INTRAVENOUS

## 2019-04-17 MED ORDER — DEXAMETHASONE SODIUM PHOSPHATE 10 MG/ML IJ SOLN
INTRAMUSCULAR | Status: DC | PRN
  Administered 2019-04-17: 10 mg via INTRAVENOUS

## 2019-04-17 MED ORDER — FENTANYL CITRATE (PF) 100 MCG/2ML IJ SOLN
INTRAMUSCULAR | Status: DC | PRN
  Administered 2019-04-17 (×2): 25 ug via INTRAVENOUS
  Administered 2019-04-17: 50 ug via INTRAVENOUS

## 2019-04-17 MED ORDER — CEFAZOLIN SODIUM-DEXTROSE 2-4 GM/100ML-% IV SOLN
2.0000 g | Freq: Once | INTRAVENOUS | Status: AC
  Administered 2019-04-17: 10:00:00 2 g via INTRAVENOUS
  Filled 2019-04-17: qty 100

## 2019-04-17 MED ORDER — LACTATED RINGERS IV SOLN
INTRAVENOUS | Status: DC
  Filled 2019-04-17: qty 1000

## 2019-04-17 MED ORDER — BELLADONNA ALKALOIDS-OPIUM 16.2-60 MG RE SUPP
RECTAL | Status: AC
  Filled 2019-04-17: qty 1

## 2019-04-17 MED ORDER — BELLADONNA ALKALOIDS-OPIUM 16.2-60 MG RE SUPP
RECTAL | Status: DC | PRN
  Administered 2019-04-17: 1 via RECTAL

## 2019-04-17 MED ORDER — FENTANYL CITRATE (PF) 100 MCG/2ML IJ SOLN
25.0000 ug | INTRAMUSCULAR | Status: DC | PRN
  Filled 2019-04-17: qty 1

## 2019-04-17 MED ORDER — OXYBUTYNIN CHLORIDE 5 MG PO TABS: 5.0000 mg | ORAL_TABLET | Freq: Three times a day (TID) | ORAL | 1 refills | Status: DC | PRN

## 2019-04-17 MED ORDER — CEFAZOLIN SODIUM-DEXTROSE 2-4 GM/100ML-% IV SOLN
INTRAVENOUS | Status: AC
  Filled 2019-04-17: qty 100

## 2019-04-17 MED ORDER — PROPOFOL 10 MG/ML IV BOLUS
INTRAVENOUS | Status: AC
  Filled 2019-04-17: qty 20

## 2019-04-17 MED ORDER — CEPHALEXIN 500 MG PO CAPS: 500.0000 mg | ORAL_CAPSULE | Freq: Two times a day (BID) | ORAL | 0 refills | Status: AC

## 2019-04-17 MED ORDER — LIDOCAINE 2% (20 MG/ML) 5 ML SYRINGE
INTRAMUSCULAR | Status: DC | PRN
  Administered 2019-04-17: 80 mg via INTRAVENOUS

## 2019-04-17 SURGICAL SUPPLY — 26 items
BAG DRAIN URO-CYSTO SKYTR STRL (DRAIN) ×3 IMPLANT
BAG DRN RND TRDRP ANRFLXCHMBR (UROLOGICAL SUPPLIES) ×1
BAG DRN UROCATH (DRAIN) ×1
BAG URINE DRAIN 2000ML AR STRL (UROLOGICAL SUPPLIES) ×3 IMPLANT
BALLN NEPHROSTOMY (BALLOONS) ×3
BALLOON NEPHROSTOMY (BALLOONS) IMPLANT
CATH FOLEY 2W COUNCIL 5CC 18FR (CATHETERS) ×2 IMPLANT
CATH ROBINSON RED A/P 14FR (CATHETERS) IMPLANT
CATH URET 5FR 28IN OPEN ENDED (CATHETERS) ×2 IMPLANT
CLOTH BEACON ORANGE TIMEOUT ST (SAFETY) ×3 IMPLANT
ELECT REM PT RETURN 9FT ADLT (ELECTROSURGICAL) ×3
ELECTRODE REM PT RTRN 9FT ADLT (ELECTROSURGICAL) ×1 IMPLANT
GLOVE BIO SURGEON STRL SZ7.5 (GLOVE) ×3 IMPLANT
GOWN STRL REUS W/ TWL XL LVL3 (GOWN DISPOSABLE) ×3 IMPLANT
GOWN STRL REUS W/TWL XL LVL3 (GOWN DISPOSABLE) ×9
GUIDEWIRE ZIPWRE .038 STRAIGHT (WIRE) ×3 IMPLANT
HOLDER FOLEY CATH W/STRAP (MISCELLANEOUS) ×3 IMPLANT
KIT TURNOVER CYSTO (KITS) ×3 IMPLANT
MANIFOLD NEPTUNE II (INSTRUMENTS) ×3 IMPLANT
NDL HYPO 18GX1.5 BLUNT FILL (NEEDLE) IMPLANT
NEEDLE HYPO 18GX1.5 BLUNT FILL (NEEDLE) IMPLANT
PACK CYSTO (CUSTOM PROCEDURE TRAY) ×3 IMPLANT
SYR 30ML LL (SYRINGE) IMPLANT
TUBE CONNECTING 12'X1/4 (SUCTIONS) ×1
TUBE CONNECTING 12X1/4 (SUCTIONS) ×2 IMPLANT
WATER STERILE IRR 3000ML UROMA (IV SOLUTION) ×3 IMPLANT

## 2019-04-17 NOTE — Discharge Instructions (Signed)

## 2019-04-17 NOTE — Transfer of Care (Signed)
Immediate Anesthesia Transfer of Care Note  Patient: Jonathan [name]  Procedure(s) Performed: CYSTOSCOPY WITH URETHRAL DILATATION, CYSTOGRAM (N/A )  Patient Location: PACU  Anesthesia Type:General  Level of Consciousness: awake, alert  and oriented  Airway & Oxygen Therapy: Patient Spontanous Breathing and Patient connected to nasal cannula oxygen  Post-op Assessment: Report given to RN and Post -op Vital signs reviewed and stable  Post vital signs: Reviewed and stable  Last Vitals:  Vitals Value Taken Time  BP 176/81 04/17/19 1034  Temp 36.9 C 04/17/19 1032  Pulse 69 04/17/19 1035  Resp 19 04/17/19 1035  SpO2 100 % 04/17/19 1035  Vitals shown include unvalidated device data.  Last Pain:  Vitals:   04/17/19 0842  TempSrc: Oral  PainSc: 0-No pain      Patients Stated Pain Goal: 5 ([id number])  Complications: No apparent anesthesia complications

## 2019-04-17 NOTE — Anesthesia Postprocedure Evaluation (Signed)
Anesthesia Post Note  Patient: Jonathan [name]  Procedure(s) Performed: CYSTOSCOPY WITH URETHRAL DILATATION, CYSTOGRAM (N/A )     Patient location during evaluation: PACU Anesthesia Type: General Level of consciousness: awake and alert Pain management: pain level controlled Vital Signs Assessment: post-procedure vital signs reviewed and stable Respiratory status: spontaneous breathing, nonlabored ventilation, respiratory function stable and patient connected to nasal cannula oxygen Cardiovascular status: blood pressure returned to baseline and stable Postop Assessment: no apparent nausea or vomiting Anesthetic complications: no    Last Vitals:  Vitals:   04/17/19 1130 04/17/19 1236  BP: (!) 162/74 (!) 178/73  Pulse: (!) 59 66  Resp: 17 18  Temp:    SpO2: 97% 99%    Last Pain:  Vitals:   04/17/19 1215  TempSrc:   PainSc: [place]

## 2019-04-17 NOTE — Anesthesia Preprocedure Evaluation (Signed)
Anesthesia Evaluation  Patient identified by MRN, date of birth, ID band Patient awake    Reviewed: Allergy & Precautions, H&P , NPO status , Patient's Chart, lab work & pertinent test results  Airway Mallampati: II   Neck ROM: full    Dental   Pulmonary shortness of breath,    breath sounds clear to auscultation       Cardiovascular hypertension,  Rhythm:regular Rate:Normal     Neuro/Psych  Neuromuscular disease    GI/Hepatic GERD  ,  Endo/Other  diabetes, Type 2obese  Renal/GU Renal disease     Musculoskeletal  (+) Arthritis ,   Abdominal   Peds  Hematology   Anesthesia Other Findings   Reproductive/Obstetrics                             Anesthesia Physical Anesthesia Plan  ASA: II  Anesthesia Plan: General   Post-op Pain Management:    Induction: Intravenous  PONV Risk Score and Plan: 2 and Ondansetron, Dexamethasone, Midazolam and Treatment may vary due to age or medical condition  Airway Management Planned: LMA  Additional Equipment:   Intra-op Plan:   Post-operative Plan: Extubation in OR  Informed Consent: I have reviewed the patients History and Physical, chart, labs and discussed the procedure including the risks, benefits and alternatives for the proposed anesthesia with the patient or authorized representative who has indicated his/her understanding and acceptance.       Plan Discussed with: CRNA, Anesthesiologist and Surgeon  Anesthesia Plan Comments:         Anesthesia Quick Evaluation

## 2019-04-17 NOTE — Op Note (Addendum)
Operative Note  Preoperative diagnosis:  1.  Membranous urethral stricture  Postoperative diagnosis: 1.  Multiple membranous urethral strictures  Procedure(s): 1.  Cystoscopy with balloon dilation of urethral strictures 2.  Cystogram with intraoperative interpretation of fluoroscopic imaging  Surgeon: Ellison Hughs, MD  Assistants:  None  Anesthesia:  General  Complications:  None  EBL: 5 mL  Specimens: 1.  None  Drains/Catheters: 1.  33 French council tip catheter with 10 mL in the balloon  Intraoperative findings:   1. Multiple thin membranous urethral stricture measuring approximately 2 to 3 mm in length 2. Cystogram confirmed intravesical placement of the wire.  The bladder contour was irregular, specifically at the bladder dome where a large diverticulum was noted.  No other intravesical filling defects were noted. 3. Cystoscopy revealed moderate to severe bladder trabeculation with a large mouth diverticulum involving the bladder dome  Indication:  Jonathan [name] is a 73 y.o. male with progressively worsening lower urinary tract symptoms and a history of urethral stricture disease.  He underwent flexible cystoscopy in the office, urethral stricture disease.  He has been consented for the above procedures, voices understanding and wishes to proceed.  Description of procedure:  After informed consent was obtained, the patient was brought to the operating room and general LMA anesthesia was administered. The patient was then placed in the dorsolithotomy position and prepped and draped in the usual sterile fashion. A timeout was performed. A 23 French rigid cystoscope was then inserted into the urethral meatus and advanced to the membranous urethra, where multiple strictures were identified.  A Glidewire was then advanced through the aperture of the urethral strictures and into the bladder.  A 5 French ureteral catheter was then advanced over the wire and into the  bladder.  A cystogram was obtained, with the findings listed above.  A Glidewire was then advanced back through the ureteral catheter and into the bladder with multiple curl seen on fluoroscopy.  A 20 French balloon dilator was then advanced over the wire and into position within the proximal aspects of the urethra.  The balloon dilator was then inflated up to 20 atm with no waist seen on intraoperative fluoroscopy.  Balloon dilator was then deflated and removed over the wire.    A cystoscopy was then performed, revealing moderate to severe bladder trabeculation as well as a widemouth diverticulum involving the bladder dome.  The cystoscope was then removed, leaving the wire in place.  An 54 French council tip catheter was then advanced over the wire and into position within the bladder, with return of clear irrigant.  The Foley catheter was then placed to gravity drainage.  He tolerated the procedure well and was transferred to the postanesthesia in stable condition.  Plan: The patient has been instructed to remove his Foley catheter in 1 week and then start CIC once a week.

## 2019-04-17 NOTE — H&P (Signed)
Office Visit Report     03/21/2019   --------------------------------------------------------------------------------   Jonathan [name]  MRN: [id number]  DOB: Jul 27, 1945, 73 year old Male  SSN:    PRIMARY CARE:  Judd Lien, MD  REFERRING:    PROVIDER:  Ellison Hughs, M.D.  LOCATION:  [facility] [phone]     --------------------------------------------------------------------------------   CC/HPI: CC: Weak urinary stream   HPI: [name] is a 73 year old male with a history of BPH with lower urinary tract symptoms, urethral stricture disease (required balloon dilation several years ago) and chronic back pain (hx of multiple lumbar back surgeries for disc herniation)   Last PSA- levels have been normal, per the patient   02/08/19: The patient reports a one week history of dysuria and urgency. He also states that his FOS has slowly weakened over the past year. No interval UTI sxs until recently. Denies fever, nausea or flank pain.   PVR- 60 mL   03/21/19: The patient is here today for routine follow-up and cystoscopy after starting tamsulosin twice daily. He reports some improvement in his FOS and urgency, but still has to strain in order to initiate his void. Denies interval UTIs, dysuria or hematuria.     ALLERGIES: None   MEDICATIONS: Aspirin  Metoprolol Tartrate  Omeprazole  Amlodipine Besylate  Atorvastatin Calcium  Folic Acid  Glipizide  Losartan Potassium  Prednisolone     GU PSH: No GU PSH    NON-GU PSH: Back Surgery (Unspecified) - 1994 Remove Gallbladder     GU PMH: Acute Cystitis/UTI - 02/08/2019 Weak Urinary Stream - 02/08/2019 BPH w/LUTS - 2019 Incomplete bladder emptying - 2019 Urinary Frequency - 2019 Urinary Urgency - 2019    NON-GU PMH: No Non-GU PMH    FAMILY HISTORY: No Family History    SOCIAL HISTORY: No Social History    REVIEW OF SYSTEMS:    GU Review Male:   Patient reports get up at night to urinate.  Patient denies frequent urination, hard to postpone urination, burning/ pain with urination, leakage of urine, stream starts and stops, trouble starting your stream, have to strain to urinate , erection problems, and penile pain.  Gastrointestinal (Upper):   Patient denies nausea, vomiting, and indigestion/ heartburn.  Gastrointestinal (Lower):   Patient denies diarrhea and constipation.  Constitutional:   Patient denies fever, night sweats, weight loss, and fatigue.  Skin:   Patient denies skin rash/ lesion and itching.  Eyes:   Patient denies blurred vision and double vision.  Ears/ Nose/ Throat:   Patient denies sore throat and sinus problems.  Hematologic/Lymphatic:   Patient denies swollen glands and easy bruising.  Cardiovascular:   Patient denies leg swelling and chest pains.  Respiratory:   Patient denies cough and shortness of breath.  Endocrine:   Patient denies excessive thirst.  Musculoskeletal:   Patient denies back pain and joint pain.  Neurological:   Patient denies headaches and dizziness.  Psychologic:   Patient denies depression and anxiety.   Notes: pt gets up twice at night .    VITAL SIGNS:      03/21/2019 02:39 PM  Weight 229 lb / 103.87 kg  Height 69 in / 175.26 cm  BP 171/92 mmHg  Heart Rate 74 /min  Temperature 96.6 F / 35.8 C  BMI 33.8 kg/m   MULTI-SYSTEM PHYSICAL EXAMINATION:    Constitutional: Well-nourished. No physical deformities. Normally developed. Good grooming.  Neck: Neck symmetrical, not swollen. Normal tracheal position.  Respiratory:  No labored breathing, no use of accessory muscles.   Cardiovascular: Normal temperature, normal extremity pulses, no swelling, no varicosities.  Skin: No paleness, no jaundice, no cyanosis. No lesion, no ulcer, no rash.  Neurologic / Psychiatric: Oriented to time, oriented to place, oriented to person. No depression, no anxiety, no agitation.  Gastrointestinal: No mass, no tenderness, no rigidity, non obese  abdomen.  Musculoskeletal: Normal gait and station of head and neck.     PAST DATA REVIEWED:  Source Of History:  Patient   PROCEDURES:         Flexible Cystoscopy - 52000  Risks, benefits, and some of the potential complications of the procedure were discussed at length with the patient including infection, bleeding, voiding discomfort, urinary retention, fever, chills, sepsis, and others. All questions were answered. Informed consent was obtained. Antibiotic prophylaxis was given. Sterile technique and intraurethral analgesia were used.  Meatus:  Normal size. Normal location. Normal condition.  Urethra:  Mild membranous stricture.  External Sphincter:  Normal.  Verumontanum:  Normal.  Prostate:  Non-obstructing. No hyperplasia.  Bladder Neck:  Non-obstructing.  Ureteral Orifices:  Normal location. Normal size. Normal shape. Effluxed clear urine.  Bladder:  No trabeculation. No tumors. Normal mucosa. No stones.      The lower urinary tract was carefully examined. The procedure was well-tolerated and without complications. Antibiotic instructions were given. Instructions were given to call the office immediately for bloody urine, difficulty urinating, urinary retention, painful or frequent urination, fever, chills, nausea, vomiting or other illness. The patient stated that he understood these instructions and would comply with them.         Urinalysis Dipstick Dipstick Cont'd  Color: Yellow Bilirubin: Neg mg/dL  Appearance: Clear Ketones: Neg mg/dL  Specific Gravity: 1.015 Blood: Neg ery/uL  pH: 6.0 Protein: Trace mg/dL  Glucose: Neg mg/dL Urobilinogen: 0.2 mg/dL    Nitrites: Neg    Leukocyte Esterase: Neg leu/uL    ASSESSMENT:      ICD-10 Details  1 GU:   BPH w/LUTS - N40.1   2   Incomplete bladder emptying - R39.14    PLAN:           Schedule Return Visit/Planned Activity: ASAP - Schedule Surgery          Document Letter(s):  Created for Patient: Clinical Summary          Notes:   -Cystoscopy today revealed a thin membranous urethral stricture that is the likely source of his ongoing LUTS. The risks, benefits and alternatives of cystoscopy with balloon dilation of his urethral stricture was discussed with the patient. Risks include bleeding, UTI, stricture recurrence, pain, the need for self catheterization, MI, CVA, PE and DVT. He voices understanding and wishes to proceed.   -I also discussed the need for him to perform CIC once a week following his urethral dilation to keep the stricture patent. He voices understanding. Instructions for CIC provided. His wife works in [employer] and can assist.     Signed by Ellison Hughs, M.D. on 03/21/19 at 5:32 PM (EST)

## 2019-04-17 NOTE — Anesthesia Procedure Notes (Signed)
Procedure Name: LMA Insertion Date/Time: 04/17/2019 10:00 AM Performed by: Bradleigh Sonnen D, CRNA Pre-anesthesia Checklist: Patient identified, Emergency Drugs available, Suction available and Patient being monitored Patient Re-evaluated:Patient Re-evaluated prior to induction Oxygen Delivery Method: Circle system utilized Preoxygenation: Pre-oxygenation with 100% oxygen Induction Type: IV induction Ventilation: Mask ventilation without difficulty LMA: LMA inserted LMA Size: 4.0 Tube type: Oral Number of attempts: 1 Placement Confirmation: positive ETCO2 and breath sounds checked- equal and bilateral Tube secured with: Tape Dental Injury: Teeth and Oropharynx as per pre-operative assessment

## 2019-05-02 DIAGNOSIS — N35012 Post-traumatic membranous urethral stricture: Secondary | ICD-10-CM | POA: Diagnosis not present

## 2019-05-02 DIAGNOSIS — R3914 Feeling of incomplete bladder emptying: Secondary | ICD-10-CM | POA: Diagnosis not present

## 2019-07-04 ENCOUNTER — Ambulatory Visit: Payer: Medicare Other | Attending: Internal Medicine

## 2019-07-04 DIAGNOSIS — Z23 Encounter for immunization: Secondary | ICD-10-CM | POA: Insufficient documentation

## 2019-07-04 NOTE — Progress Notes (Signed)
   Covid-19 Vaccination Clinic  Name:  Jonathan [name]    MRN: [id number] DOB: 1945-07-30  07/04/2019  [name] was observed post Covid-19 immunization for 15 minutes without incident. He was provided with Vaccine Information Sheet and instruction to access the V-Safe system.   [name] was instructed to call 911 with any severe reactions post vaccine: Marland Kitchen Difficulty breathing  . Swelling of face and throat  . A fast heartbeat  . A bad rash all over body  . Dizziness and weakness   Immunizations Administered    Name Date Dose VIS Date Route   Pfizer COVID-19 Vaccine 07/04/2019  2:10 PM 0.3 mL 04/12/2019 Intramuscular   Manufacturer: [place]   Lot: [id number]   [place]: [id number]

## 2019-07-31 ENCOUNTER — Ambulatory Visit: Payer: Medicare Other | Attending: Internal Medicine

## 2019-07-31 DIAGNOSIS — Z23 Encounter for immunization: Secondary | ICD-10-CM

## 2019-07-31 NOTE — Progress Notes (Signed)
   Covid-19 Vaccination Clinic  Name:  Jonathan [name]    MRN: [id number] DOB: Jan 28, 1946  07/31/2019  Jonathan [name] was observed post Covid-19 immunization for 15 minutes without incident. He was provided with Vaccine Information Sheet and instruction to access the V-Safe system.   Jonathan [name] was instructed to call 911 with any severe reactions post vaccine: Marland Kitchen Difficulty breathing  . Swelling of face and throat  . A fast heartbeat  . A bad rash all over body  . Dizziness and weakness   Immunizations Administered    Name Date Dose VIS Date Route   Pfizer COVID-19 Vaccine 07/31/2019  8:28 AM 0.3 mL 04/12/2019 Intramuscular   Manufacturer: [place]   Lot: [id number]   [place]: [id number]

## 2020-04-14 ENCOUNTER — Other Ambulatory Visit: Payer: Self-pay | Admitting: Neurological Surgery

## 2020-04-14 DIAGNOSIS — G8929 Other chronic pain: Secondary | ICD-10-CM

## 2020-05-08 ENCOUNTER — Other Ambulatory Visit: Payer: Self-pay

## 2020-05-08 ENCOUNTER — Ambulatory Visit
Admission: RE | Admit: 2020-05-08 | Discharge: 2020-05-08 | Disposition: A | Discharge status: Home / Self Care | Payer: Medicare Other | Source: Ambulatory Visit | Attending: Neurological Surgery | Admitting: Neurological Surgery

## 2020-05-08 DIAGNOSIS — G8929 Other chronic pain: Secondary | ICD-10-CM

## 2020-09-24 ENCOUNTER — Other Ambulatory Visit: Payer: Self-pay

## 2020-09-24 ENCOUNTER — Ambulatory Visit (INDEPENDENT_AMBULATORY_CARE_PROVIDER_SITE_OTHER): Payer: Medicare Other | Admitting: Internal Medicine

## 2020-09-24 ENCOUNTER — Encounter: Payer: Self-pay | Admitting: Internal Medicine

## 2020-09-24 VITALS — BP 167/75 | HR 68 | Temp 97.7°F | Ht 69.0 in | Wt 234.0 lb

## 2020-09-24 DIAGNOSIS — N183 Chronic kidney disease, stage 3 unspecified: Secondary | ICD-10-CM | POA: Diagnosis not present

## 2020-09-24 DIAGNOSIS — K219 Gastro-esophageal reflux disease without esophagitis: Secondary | ICD-10-CM

## 2020-09-24 DIAGNOSIS — I10 Essential (primary) hypertension: Secondary | ICD-10-CM | POA: Diagnosis not present

## 2020-09-24 DIAGNOSIS — N400 Enlarged prostate without lower urinary tract symptoms: Secondary | ICD-10-CM

## 2020-09-24 DIAGNOSIS — Z114 Encounter for screening for human immunodeficiency virus [HIV]: Secondary | ICD-10-CM

## 2020-09-24 DIAGNOSIS — Z7689 Persons encountering health services in other specified circumstances: Secondary | ICD-10-CM

## 2020-09-24 DIAGNOSIS — M069 Rheumatoid arthritis, unspecified: Secondary | ICD-10-CM

## 2020-09-24 DIAGNOSIS — Z1159 Encounter for screening for other viral diseases: Secondary | ICD-10-CM

## 2020-09-24 DIAGNOSIS — E1122 Type 2 diabetes mellitus with diabetic chronic kidney disease: Secondary | ICD-10-CM

## 2020-09-24 MED ORDER — LOSARTAN POTASSIUM-HCTZ 100-25 MG PO TABS: 1.0000 | ORAL_TABLET | Freq: Every day | ORAL | 0 refills | Status: DC

## 2020-09-24 NOTE — Assessment & Plan Note (Signed)
BMP reviewed from EMR (2020) Check CMP Avoid nephrotoxic agents On ARB

## 2020-09-24 NOTE — Assessment & Plan Note (Signed)
On Methotrexate and Prednisone F/u with Dr Beekman 

## 2020-09-24 NOTE — Assessment & Plan Note (Signed)
Patient reports last HbA1C < 7 On Metformin and Glipizide Advised to follow diabetic diet On ARB Will start statin after checking lipid profile F/u CMP and lipid panel Diabetic foot exam: Today Diabetic eye exam: Advised to follow up with Ophthalmology for diabetic eye exam

## 2020-09-24 NOTE — Assessment & Plan Note (Signed)
On Omeprazole 

## 2020-09-24 NOTE — Assessment & Plan Note (Signed)
On Tamsulosin 0.4 mg BID

## 2020-09-24 NOTE — Assessment & Plan Note (Signed)
BP Readings from Last 1 Encounters:  09/24/20 (!) 167/75   uncontrolled with Amlodipine, Losartan and Metoprolol Switched to Losartan-HCTZ Check CMP Counseled for compliance with the medications Advised DASH diet and moderate exercise/walking, at least 150 mins/week

## 2020-09-24 NOTE — Patient Instructions (Signed)
Please start taking Losartan-HCTZ instead of Losartan.  Please continue to take other medications as prescribed.  Please get fasting blood tests done before the next visit.  Please follow low carb and low salt diet and perform moderate exercise/walking at least 150 mins/week.

## 2020-09-24 NOTE — Progress Notes (Signed)
 New Patient Office Visit  Subjective:  Patient ID: Jonathan [name], male    DOB: 09/07/1945  Age: 75 y.o. MRN: [id number]  CC:  Chief Complaint  Patient presents with  . New Patient (Initial Visit)    Here to establish care. Complains of hypertension today, has been ongoing for awhile now.    HPI Jonathan [name] is a 75-year-old male with PMH of HTN, DM, GERD, RA, CKD stage 3, BPH and obesity who presents for establishing care. He is a former patient of Dr Hasanaj.  HTN: His BP was elevated in the office today. He has been concerned about his elevated BP. He takes his medications regularly. Denies any headache, dizziness, chest pain, dyspnea or palpitations.  DM: Takes Metformin and Glipizide. Denies polyuria or polydipsia. Last HbA1C was < 7 according to the patient.  RA: Follows up with Dr Beekman and takes Methotrexate and Prednisone.  BPH: On Tamsulosin. Denies dysuria or hematuria.  He has had PPSV23 according to chart review. He is up-to-date with COVID vaccine.  He has had colonoscopy twice and he was told that he does not need any colonoscopy now. No further details available.  Past Medical History:  Diagnosis Date  . Arthritis    ra, sees dr james beakman  . Coronary artery disease    saw dr zachery in [place] 20 yrs ago, released from cardiology  . Diabetes mellitus without complication (HCC)    type 2  . Hypertension   . Urethral stricture     Past Surgical History:  Procedure Laterality Date  . BACK SURGERY     x 3 lower back  . CARDIAC CATHETERIZATION  2000   small amt plaque relased from cardiology 20 yrs ago  . CHOLECYSTECTOMY    . CYSTOSCOPY WITH URETHRAL DILATATION N/A 04/17/2019   Procedure: CYSTOSCOPY WITH URETHRAL DILATATION, CYSTOGRAM;  Surgeon: Winter, Christopher Aaron, MD;  Location: [facility];  Service: Urology;  Laterality: N/A;  . EYE SURGERY Bilateral    cataracts  . urethral stricture surgery  15 yrs ago     History reviewed. No pertinent family history.  Social History   Socioeconomic History  . Marital status: Widowed    Spouse name: Not on file  . Number of children: Not on file  . Years of education: Not on file  . Highest education level: Not on file  Occupational History  . Not on file  Tobacco Use  . Smoking status: Never Smoker  . Smokeless tobacco: Never Used  Vaping Use  . Vaping Use: Never used  Substance and Sexual Activity  . Alcohol use: No  . Drug use: No  . Sexual activity: Not on file  Other Topics Concern  . Not on file  Social History Narrative  . Not on file   Social Determinants of Health   Financial Resource Strain: Not on file  Food Insecurity: Not on file  Transportation Needs: Not on file  Physical Activity: Not on file  Stress: Not on file  Social Connections: Not on file  Intimate Partner Violence: Not on file    ROS Review of Systems  Constitutional: Negative for chills and fever.  HENT: Negative for congestion and sore throat.   Eyes: Negative for pain and discharge.  Respiratory: Negative for cough and shortness of breath.   Cardiovascular: Negative for chest pain and palpitations.  Gastrointestinal: Negative for constipation, diarrhea, nausea and vomiting.  Endocrine: Negative for polydipsia and polyuria.  Genitourinary: Negative   for dysuria and hematuria.  Musculoskeletal: Negative for neck pain and neck stiffness.  Skin: Negative for rash.  Neurological: Negative for dizziness, weakness, numbness and headaches.  Psychiatric/Behavioral: Negative for agitation and behavioral problems.    Objective:   Today's Vitals: BP (!) 167/75 (BP Location: Left Arm, Patient Position: Sitting, Cuff Size: Large)   Pulse 68   Temp 97.7 F (36.5 C) (Temporal)   Ht 5' 9" (1.753 m)   Wt 234 lb (106.1 kg)   SpO2 98%   BMI 34.56 kg/m   Physical Exam Vitals reviewed.  Constitutional:      General: He is not in acute distress.     Appearance: He is obese. He is not diaphoretic.  HENT:     Head: Normocephalic and atraumatic.     Nose: Nose normal.     Mouth/Throat:     Mouth: Mucous membranes are moist.  Eyes:     General: No scleral icterus.    Extraocular Movements: Extraocular movements intact.  Cardiovascular:     Rate and Rhythm: Normal rate and regular rhythm.     Pulses: Normal pulses.     Heart sounds: Normal heart sounds. No murmur heard.   Pulmonary:     Breath sounds: Normal breath sounds. No wheezing or rales.  Abdominal:     Palpations: Abdomen is soft.     Tenderness: There is no abdominal tenderness.  Musculoskeletal:     Cervical back: Neck supple. No tenderness.     Right lower leg: No edema.     Left lower leg: No edema.  Skin:    General: Skin is warm.     Findings: No rash.  Neurological:     General: No focal deficit present.     Mental Status: He is alert and oriented to person, place, and time.  Psychiatric:        Mood and Affect: Mood normal.        Behavior: Behavior normal.     Assessment & Plan:   Problem List Items Addressed This Visit      Cardiovascular and Mediastinum   Essential hypertension - Primary    BP Readings from Last 1 Encounters:  09/24/20 (!) 167/75   uncontrolled with Amlodipine, Losartan and Metoprolol Switched to Losartan-HCTZ Check CMP Counseled for compliance with the medications Advised DASH diet and moderate exercise/walking, at least 150 mins/week      Relevant Medications   aspirin EC 81 MG tablet   losartan-hydrochlorothiazide (HYZAAR) 100-25 MG tablet   Other Relevant Orders   CBC with Differential/Platelet   CMP14+EGFR   TSH     Digestive   GERD (gastroesophageal reflux disease)    On Omeprazole        Endocrine   Diabetes (HCC)    Patient reports last HbA1C < 7 On Metformin and Glipizide Advised to follow diabetic diet On ARB Will start statin after checking lipid profile F/u CMP and lipid panel Diabetic foot exam:  Today Diabetic eye exam: Advised to follow up with Ophthalmology for diabetic eye exam       Relevant Medications   aspirin EC 81 MG tablet   losartan-hydrochlorothiazide (HYZAAR) 100-25 MG tablet   Other Relevant Orders   Lipid panel   Hemoglobin A1c     Musculoskeletal and Integument   Rheumatoid arthritis (HCC)    On Methotrexate and Prednisone F/u with Dr Beekman      Relevant Medications   aspirin EC 81 MG tablet   methotrexate (RHEUMATREX)   2.5 MG tablet     Genitourinary   Chronic kidney disease, stage III (moderate) (HCC)    BMP reviewed from EMR (2020) Check CMP Avoid nephrotoxic agents On ARB      Relevant Orders   Vitamin D (25 hydroxy)   BPH (benign prostatic hyperplasia)    On Tamsulosin 0.4 mg BID       Other Visit Diagnoses    Encounter to establish care   Care established History and medications reviewed with the patient     Relevant Orders   Vitamin D (25 hydroxy)   Need for hepatitis C screening test       Relevant Orders   Hepatitis C Antibody   Encounter for screening for HIV       Relevant Orders   HIV antibody (with reflex)      Outpatient Encounter Medications as of 09/24/2020  Medication Sig  . amLODipine (NORVASC) 10 MG tablet Take 10 mg by mouth daily.  . aspirin EC 81 MG tablet Take 81 mg by mouth daily. Swallow whole.  . Cyanocobalamin 2500 MCG CHEW Chew by mouth daily.  . folic acid (FOLVITE) 1 MG tablet Take 1 mg by mouth daily.  . glipiZIDE (GLUCOTROL) 5 MG tablet Take 5 mg by mouth 2 (two) times daily before a meal.  . losartan-hydrochlorothiazide (HYZAAR) 100-25 MG tablet Take 1 tablet by mouth daily.  . methotrexate (RHEUMATREX) 2.5 MG tablet Caution:Chemotherapy. Protect from light. Take 5 tablets PO once weekly.  . metoprolol succinate (TOPROL-XL) 50 MG 24 hr tablet Take 50 mg by mouth 2 (two) times daily. Take with or immediately following a meal.  . omeprazole (PRILOSEC) 20 MG capsule Take 20 mg by mouth daily.  .  predniSONE (DELTASONE) 2.5 MG tablet Take 2.5 mg by mouth daily with breakfast.  . tamsulosin (FLOMAX) 0.4 MG CAPS capsule Take 0.4 mg by mouth 2 (two) times daily.  . [DISCONTINUED] losartan (COZAAR) 100 MG tablet Take 100 mg by mouth daily.  . [DISCONTINUED] Ascorbic Acid (VITAMIN C) 100 MG tablet Take 100 mg by mouth daily. (Patient not taking: Reported on 09/24/2020)  . [DISCONTINUED] Cholecalciferol (VITAMIN D) 125 MCG (5000 UT) CAPS Take by mouth.  . [DISCONTINUED] METHOTREXATE SODIUM IJ Inject 2.5 mLs as directed once a week. (Patient not taking: Reported on 09/24/2020)  . [DISCONTINUED] oxybutynin (DITROPAN) 5 MG tablet Take 1 tablet (5 mg total) by mouth every 8 (eight) hours as needed for bladder spasms. (Patient not taking: Reported on 09/24/2020)   No facility-administered encounter medications on file as of 09/24/2020.    Follow-up: Return in about 4 weeks (around 10/22/2020) for HTN and blood tests review.    K , MD 

## 2020-10-01 ENCOUNTER — Other Ambulatory Visit: Payer: Self-pay | Admitting: Internal Medicine

## 2020-10-01 DIAGNOSIS — I10 Essential (primary) hypertension: Secondary | ICD-10-CM

## 2020-10-20 ENCOUNTER — Other Ambulatory Visit: Payer: Self-pay

## 2020-10-20 ENCOUNTER — Ambulatory Visit (INDEPENDENT_AMBULATORY_CARE_PROVIDER_SITE_OTHER): Payer: Medicare Other | Admitting: Internal Medicine

## 2020-10-20 ENCOUNTER — Ambulatory Visit (HOSPITAL_COMMUNITY)
Admission: RE | Admit: 2020-10-20 | Discharge: 2020-10-20 | Disposition: A | Discharge status: Home / Self Care | Payer: Medicare Other | Source: Ambulatory Visit | Attending: Internal Medicine | Admitting: Internal Medicine

## 2020-10-20 ENCOUNTER — Encounter: Payer: Self-pay | Admitting: Internal Medicine

## 2020-10-20 VITALS — BP 160/76 | HR 85 | Temp 98.4°F | Resp 18 | Ht 69.0 in | Wt 232.1 lb

## 2020-10-20 DIAGNOSIS — M545 Low back pain, unspecified: Secondary | ICD-10-CM | POA: Insufficient documentation

## 2020-10-20 DIAGNOSIS — M47817 Spondylosis without myelopathy or radiculopathy, lumbosacral region: Secondary | ICD-10-CM | POA: Diagnosis not present

## 2020-10-20 DIAGNOSIS — W19XXXA Unspecified fall, initial encounter: Secondary | ICD-10-CM | POA: Insufficient documentation

## 2020-10-20 DIAGNOSIS — N400 Enlarged prostate without lower urinary tract symptoms: Secondary | ICD-10-CM | POA: Diagnosis not present

## 2020-10-20 MED ORDER — TAMSULOSIN HCL 0.4 MG PO CAPS: 0.4000 mg | ORAL_CAPSULE | Freq: Every day | ORAL | 5 refills | Status: DC

## 2020-10-20 MED ORDER — TRAMADOL HCL 50 MG PO TABS: 50.0000 mg | ORAL_TABLET | Freq: Three times a day (TID) | ORAL | 0 refills | Status: AC | PRN

## 2020-10-20 MED ORDER — METHOCARBAMOL 500 MG PO TABS: 500.0000 mg | ORAL_TABLET | Freq: Four times a day (QID) | ORAL | 0 refills | Status: DC | PRN

## 2020-10-20 MED ORDER — KETOROLAC TROMETHAMINE 60 MG/2ML IM SOLN
60.0000 mg | Freq: Once | INTRAMUSCULAR | Status: AC
  Administered 2020-10-20: 60 mg via INTRAMUSCULAR

## 2020-10-20 NOTE — Progress Notes (Signed)
Acute Office Visit  Subjective:    Patient ID: Jonathan [name], male    DOB: [birth year], 75 y.o.   MRN: [id number]  Chief Complaint  Patient presents with   Fall    Pt had a fall last night his lower back shoulders and left leg is hurting     HPI Patient is in today for evaluation of low back pain and b/l shoulder area pain after a mechanical fall outside his home. He was trying to place some boxes near a trailer, and fell on his back while trying to rely on a support that he thought was on his side. He has been having low back pain and left leg pain, which is constant, worse with bending and better with rest. Left shoulder pain is around mid-scapular region, ROM is intact with mild pain on left side. He denies any numbness, tingling or weakness of LE. Denies any urinary or stool incontinence. Denies any head injury.  Past Medical History:  Diagnosis Date   Arthritis    ra, sees dr Sedonia Small   Coronary artery disease    saw dr zachery in [place] 20 yrs ago, released from cardiology   Diabetes mellitus without complication ([place])    type 2   Hypertension    Urethral stricture     Past Surgical History:  Procedure Laterality Date   BACK SURGERY     x 3 lower back   CARDIAC CATHETERIZATION  2000   small amt plaque relased from cardiology 20 yrs ago   [place] N/A 04/17/2019   Procedure: CYSTOSCOPY WITH URETHRAL DILATATION, CYSTOGRAM;  Surgeon: Ceasar Mons, MD;  Location: [facility];  Service: Urology;  Laterality: N/A;   EYE SURGERY Bilateral    cataracts   urethral stricture surgery  15 yrs ago    History reviewed. No pertinent family history.  Social History   Socioeconomic History   Marital status: Widowed    Spouse name: Not on file   Number of children: Not on file   Years of education: Not on file   Highest education level: Not on file  Occupational History   Not on file   Tobacco Use   Smoking status: Never   Smokeless tobacco: Never  Vaping Use   Vaping Use: Never used  Substance and Sexual Activity   Alcohol use: No   Drug use: No   Sexual activity: Not on file  Other Topics Concern   Not on file  Social History Narrative   Not on file   Social Determinants of Health   Financial Resource Strain: Not on file  Food Insecurity: Not on file  Transportation Needs: Not on file  Physical Activity: Not on file  Stress: Not on file  Social Connections: Not on file  Intimate Partner Violence: Not on file    Outpatient Medications Prior to Visit  Medication Sig Dispense Refill   amLODipine (NORVASC) 10 MG tablet Take 10 mg by mouth daily.     aspirin EC 81 MG tablet Take 81 mg by mouth daily. Swallow whole.     Cyanocobalamin 2500 MCG CHEW Chew by mouth daily.     folic acid (FOLVITE) 1 MG tablet Take 1 mg by mouth daily.     glipiZIDE (GLUCOTROL) 5 MG tablet Take 5 mg by mouth 2 (two) times daily before a meal.     losartan-hydrochlorothiazide (HYZAAR) 100-25 MG tablet Take 1 tablet by  mouth daily. 30 tablet 0   methotrexate (RHEUMATREX) 2.5 MG tablet Caution:Chemotherapy. Protect from light. Take 5 tablets PO once weekly.     metoprolol succinate (TOPROL-XL) 50 MG 24 hr tablet Take 50 mg by mouth 2 (two) times daily. Take with or immediately following a meal.     omeprazole (PRILOSEC) 20 MG capsule Take 20 mg by mouth daily.     predniSONE (DELTASONE) 2.5 MG tablet Take 2.5 mg by mouth daily with breakfast.     tamsulosin (FLOMAX) 0.4 MG CAPS capsule Take 0.4 mg by mouth 2 (two) times daily.     No facility-administered medications prior to visit.    Allergies  Allergen Reactions   Oxycodone-Acetaminophen Rash    Review of Systems  Constitutional:  Negative for chills and fever.  HENT:  Negative for congestion and sore throat.   Eyes:  Negative for pain and discharge.  Respiratory:  Negative for cough and shortness of breath.    Cardiovascular:  Negative for chest pain and palpitations.  Gastrointestinal:  Negative for constipation, diarrhea, nausea and vomiting.  Endocrine: Negative for polydipsia and polyuria.  Genitourinary:  Negative for dysuria and hematuria.  Musculoskeletal:  Positive for arthralgias and back pain. Negative for neck pain and neck stiffness.  Skin:  Negative for rash.  Neurological:  Negative for dizziness, weakness, numbness and headaches.  Psychiatric/Behavioral:  Negative for agitation and behavioral problems.       Objective:    Physical Exam Vitals reviewed.  Constitutional:      General: He is not in acute distress.    Appearance: He is obese. He is not diaphoretic.  HENT:     Head: Normocephalic and atraumatic.     Nose: Nose normal.     Mouth/Throat:     Mouth: Mucous membranes are moist.  Eyes:     General: No scleral icterus.    Extraocular Movements: Extraocular movements intact.  Cardiovascular:     Rate and Rhythm: Normal rate and regular rhythm.     Pulses: Normal pulses.     Heart sounds: Normal heart sounds. No murmur heard. Pulmonary:     Breath sounds: Normal breath sounds. No wheezing or rales.  Musculoskeletal:        General: Tenderness (Lumbar spine area, no swelling or bruising) present.     Cervical back: Neck supple. No tenderness.     Right lower leg: No edema.     Left lower leg: No edema.  Skin:    General: Skin is warm.     Findings: No rash.  Neurological:     General: No focal deficit present.     Mental Status: He is alert and oriented to person, place, and time.  Psychiatric:        Mood and Affect: Mood normal.        Behavior: Behavior normal.    BP (!) 160/76 (BP Location: Left Arm, Patient Position: Sitting, Cuff Size: Normal)   Pulse 85   Temp 98.4 F (36.9 C) (Oral)   Resp 18   Ht 5\' 9"  (1.753 m)   Wt 232 lb 1.9 oz (105.3 kg)   SpO2 96%   BMI 34.28 kg/m  Wt Readings from Last 3 Encounters:  10/20/20 232 lb 1.9 oz (105.3  kg)  09/24/20 234 lb (106.1 kg)  04/17/19 219 lb 8 oz (99.6 kg)    Health Maintenance Due  Topic Date Due   OPHTHALMOLOGY EXAM  Never done   Hepatitis C Screening  Never done   TETANUS/TDAP  Never done   COLONOSCOPY (Pts 45-17yrs Insurance coverage will need to be confirmed)  Never done   Zoster Vaccines- Shingrix (1 of 2) Never done   HEMOGLOBIN A1C  01/01/2009   PNA vac Low Risk Adult (1 of 2 - PCV13) Never done   COVID-19 Vaccine (3 - Booster for Pfizer series) 12/31/2019    There are no preventive care reminders to display for this patient.   Lab Results  Component Value Date   TSH 4.284 03/31/2005   Lab Results  Component Value Date   WBC 5.4 07/16/2007   HGB 16.7 04/17/2019   HCT 49.0 04/17/2019   MCV 88.3 07/16/2007   PLT 193 07/16/2007   Lab Results  Component Value Date   NA 140 04/17/2019   K 4.2 04/17/2019   CO2 22 07/16/2007   GLUCOSE 126 (H) 04/17/2019   BUN 23 04/17/2019   CREATININE 1.30 (H) 04/17/2019   BILITOT 0.5 07/16/2007   ALKPHOS 72 07/16/2007   AST 27 07/16/2007   ALT 32 07/16/2007   PROT 7.6 07/16/2007   ALBUMIN 3.9 07/16/2007   CALCIUM 8.9 07/16/2007   Lab Results  Component Value Date   CHOL 154 07/16/2007   Lab Results  Component Value Date   HDL 54 07/16/2007   Lab Results  Component Value Date   LDLCALC 80 07/16/2007   Lab Results  Component Value Date   TRIG 100 07/16/2007   Lab Results  Component Value Date   CHOLHDL 2.9 Ratio 07/16/2007   Lab Results  Component Value Date   HGBA1C 8.0 07/01/2008       Assessment & Plan:   Problem List Items Addressed This Visit          Fall, initial encounter    -  Primary Appears to be mechanical fall, no prodromal symptoms suggestive of syncope Toradol IM Tylenol PRN for mild-moderate pain, Tramadol for severe pain Robaxin for muscle spasms Ambulate as tolerated Avoid lifting weight and frequent bending  Relevant Medications  traMADol (ULTRAM) 50 MG tablet   methocarbamol (ROBAXIN) 500 MG tablet  Other Relevant Orders  DG Lumbar Spine Complete      LOW BACK PAIN Due to fall Check X-ray lumbar spine Tramadol and Robaxin PRN   Relevant Medications   traMADol (ULTRAM) 50 MG tablet   methocarbamol (ROBAXIN) 500 MG tablet   Other Relevant Orders   DG Lumbar Spine Complete   Genitourinary  BPH (benign prostatic hyperplasia)  Relevant Medications  tamsulosin (FLOMAX) 0.4 MG CAPS capsule       Meds ordered this encounter  Medications   traMADol (ULTRAM) 50 MG tablet    Sig: Take 1 tablet (50 mg total) by mouth every 8 (eight) hours as needed for up to 5 days.    Dispense:  15 tablet    Refill:  0   methocarbamol (ROBAXIN) 500 MG tablet    Sig: Take 1 tablet (500 mg total) by mouth every 6 (six) hours as needed for muscle spasms.    Dispense:  30 tablet    Refill:  0   tamsulosin (FLOMAX) 0.4 MG CAPS capsule    Sig: Take 1 capsule (0.4 mg total) by mouth daily after supper.    Dispense:  30 capsule    Refill:  5   ketorolac (TORADOL) injection 60 mg     [name], MD

## 2020-10-20 NOTE — Patient Instructions (Signed)
Please get X-ray of lumbar spine done at [facility].  Please take Tylenol for mild-moderate pain and Tramadol for severe pain. Okay to take Methocarbamol for muscle spasms.

## 2020-10-22 ENCOUNTER — Ambulatory Visit (INDEPENDENT_AMBULATORY_CARE_PROVIDER_SITE_OTHER): Payer: Medicare Other | Admitting: Internal Medicine

## 2020-10-22 ENCOUNTER — Encounter: Payer: Self-pay | Admitting: Internal Medicine

## 2020-10-22 ENCOUNTER — Other Ambulatory Visit: Payer: Self-pay

## 2020-10-22 VITALS — BP 124/78 | HR 79 | Temp 98.2°F | Resp 18 | Ht 65.0 in | Wt 233.4 lb

## 2020-10-22 DIAGNOSIS — I10 Essential (primary) hypertension: Secondary | ICD-10-CM | POA: Diagnosis not present

## 2020-10-22 DIAGNOSIS — W19XXXD Unspecified fall, subsequent encounter: Secondary | ICD-10-CM

## 2020-10-22 DIAGNOSIS — R21 Rash and other nonspecific skin eruption: Secondary | ICD-10-CM

## 2020-10-22 MED ORDER — TRIAMCINOLONE ACETONIDE 0.1 % EX CREA: 1.0000 "application " | TOPICAL_CREAM | Freq: Two times a day (BID) | CUTANEOUS | 0 refills | Status: DC

## 2020-10-22 NOTE — Patient Instructions (Signed)
Please get fasting blood tests tomorrow.  Please continue to follow low salt diet and ambulate as tolerated.  Please apply Triamcinolone cream over rash.

## 2020-10-22 NOTE — Assessment & Plan Note (Signed)
BP Readings from Last 1 Encounters:  10/22/20 124/78   Well-controlled with Amlodipine, Losartan-HCTZ and Metoprolol Counseled for compliance with the medications Advised DASH diet and moderate exercise/walking, at least 150 mins/week

## 2020-10-22 NOTE — Progress Notes (Signed)
Established Patient Office Visit  Subjective:  Patient ID: Jonathan [name], male    DOB: 02/14/1946  Age: 75 y.o. MRN: [id number]  CC:  Chief Complaint  Patient presents with   Follow-up    4 week follow up HTN and labs his back is feeling a little better has a rash that is itching on his arms     HPI Jonathan [name] is a 75 year old male with PMH of HTN, DM, GERD, RA, CKD stage 3, BPH and obesity who presents for follow up of HTN.  HTN: BP is well-controlled now. Takes medications regularly. Patient denies headache, dizziness, chest pain, dyspnea or palpitations.  DM: Takes Metformin and Glipizide. Denies polyuria or polydipsia. Last HbA1C was < 7 according to the patient.  He has been having itching rash over his left forearm for last 4 days. He has tried OTC Cortisone cream with minimal relief.  His back pain has improved compared to prior. Denies any numbness or tingling of the LE.  Past Medical History:  Diagnosis Date   Arthritis    ra, sees dr Sedonia Small   Coronary artery disease    saw dr zachery in [place] 20 yrs ago, released from cardiology   Diabetes mellitus without complication ([place])    type 2   Hypertension    Urethral stricture     Past Surgical History:  Procedure Laterality Date   BACK SURGERY     x 3 lower back   CARDIAC CATHETERIZATION  2000   small amt plaque relased from cardiology 20 yrs ago   [place] N/A 04/17/2019   Procedure: CYSTOSCOPY WITH URETHRAL DILATATION, CYSTOGRAM;  Surgeon: Ceasar Mons, MD;  Location: [facility];  Service: Urology;  Laterality: N/A;   EYE SURGERY Bilateral    cataracts   urethral stricture surgery  15 yrs ago    History reviewed. No pertinent family history.  Social History   Socioeconomic History   Marital status: Widowed    Spouse name: Not on file   Number of children: Not on file   Years of education: Not on file    Highest education level: Not on file  Occupational History   Not on file  Tobacco Use   Smoking status: Never   Smokeless tobacco: Never  Vaping Use   Vaping Use: Never used  Substance and Sexual Activity   Alcohol use: No   Drug use: No   Sexual activity: Not on file  Other Topics Concern   Not on file  Social History Narrative   Not on file   Social Determinants of Health   Financial Resource Strain: Not on file  Food Insecurity: Not on file  Transportation Needs: Not on file  Physical Activity: Not on file  Stress: Not on file  Social Connections: Not on file  Intimate Partner Violence: Not on file    Outpatient Medications Prior to Visit  Medication Sig Dispense Refill   amLODipine (NORVASC) 10 MG tablet Take 10 mg by mouth daily.     aspirin EC 81 MG tablet Take 81 mg by mouth daily. Swallow whole.     Cyanocobalamin 2500 MCG CHEW Chew by mouth daily.     folic acid (FOLVITE) 1 MG tablet Take 1 mg by mouth daily.     glipiZIDE (GLUCOTROL) 5 MG tablet Take 5 mg by mouth 2 (two) times daily before a meal.     losartan-hydrochlorothiazide (HYZAAR)  100-25 MG tablet Take 1 tablet by mouth daily. 30 tablet 0   methocarbamol (ROBAXIN) 500 MG tablet Take 1 tablet (500 mg total) by mouth every 6 (six) hours as needed for muscle spasms. 30 tablet 0   methotrexate (RHEUMATREX) 2.5 MG tablet Caution:Chemotherapy. Protect from light. Take 5 tablets PO once weekly.     metoprolol succinate (TOPROL-XL) 50 MG 24 hr tablet Take 50 mg by mouth 2 (two) times daily. Take with or immediately following a meal.     omeprazole (PRILOSEC) 20 MG capsule Take 20 mg by mouth daily.     predniSONE (DELTASONE) 2.5 MG tablet Take 2.5 mg by mouth daily with breakfast.     tamsulosin (FLOMAX) 0.4 MG CAPS capsule Take 1 capsule (0.4 mg total) by mouth daily after supper. 30 capsule 5   traMADol (ULTRAM) 50 MG tablet Take 1 tablet (50 mg total) by mouth every 8 (eight) hours as needed for up to 5 days.  15 tablet 0   No facility-administered medications prior to visit.    Allergies  Allergen Reactions   Oxycodone-Acetaminophen Rash    ROS Review of Systems  Constitutional:  Negative for chills and fever.  HENT:  Negative for congestion and sore throat.   Eyes:  Negative for pain and discharge.  Respiratory:  Negative for cough and shortness of breath.   Cardiovascular:  Negative for chest pain and palpitations.  Gastrointestinal:  Negative for constipation, diarrhea, nausea and vomiting.  Endocrine: Negative for polydipsia and polyuria.  Genitourinary:  Negative for dysuria and hematuria.  Musculoskeletal:  Positive for arthralgias and back pain. Negative for neck pain and neck stiffness.  Skin:  Positive for rash.  Neurological:  Negative for dizziness, weakness, numbness and headaches.  Psychiatric/Behavioral:  Negative for agitation and behavioral problems.      Objective:    Physical Exam Vitals reviewed.  Constitutional:      General: He is not in acute distress.    Appearance: He is obese. He is not diaphoretic.  HENT:     Head: Normocephalic and atraumatic.     Nose: Nose normal.     Mouth/Throat:     Mouth: Mucous membranes are moist.  Eyes:     General: No scleral icterus.    Extraocular Movements: Extraocular movements intact.  Cardiovascular:     Rate and Rhythm: Normal rate and regular rhythm.     Pulses: Normal pulses.     Heart sounds: Normal heart sounds. No murmur heard. Pulmonary:     Breath sounds: Normal breath sounds. No wheezing or rales.  Musculoskeletal:        General: Tenderness (Lumbar spine area, no swelling or bruising) present.     Cervical back: Neck supple. No tenderness.     Right lower leg: No edema.     Left lower leg: No edema.  Skin:    General: Skin is warm.     Findings: Rash (Erythematous over left forearm, about 1 cm in diameter) present.  Neurological:     General: No focal deficit present.     Mental Status: He is  alert and oriented to person, place, and time.  Psychiatric:        Mood and Affect: Mood normal.        Behavior: Behavior normal.    BP 124/78 (BP Location: Right Arm, Cuff Size: Normal)   Pulse 79   Temp 98.2 F (36.8 C) (Oral)   Resp 18   Ht 5\' 5"  (1.651  m)   Wt 233 lb 6.4 oz (105.9 kg)   SpO2 96%   BMI 38.84 kg/m  Wt Readings from Last 3 Encounters:  10/22/20 233 lb 6.4 oz (105.9 kg)  10/20/20 232 lb 1.9 oz (105.3 kg)  09/24/20 234 lb (106.1 kg)     Health Maintenance Due  Topic Date Due   OPHTHALMOLOGY EXAM  Never done   Hepatitis C Screening  Never done   TETANUS/TDAP  Never done   COLONOSCOPY (Pts 45-49yrs Insurance coverage will need to be confirmed)  Never done   Zoster Vaccines- Shingrix (1 of 2) Never done   HEMOGLOBIN A1C  01/01/2009   PNA vac Low Risk Adult (1 of 2 - PCV13) Never done   COVID-19 Vaccine (3 - Booster for Pfizer series) 12/31/2019    There are no preventive care reminders to display for this patient.  Lab Results  Component Value Date   TSH 4.284 03/31/2005   Lab Results  Component Value Date   WBC 5.4 07/16/2007   HGB 16.7 04/17/2019   HCT 49.0 04/17/2019   MCV 88.3 07/16/2007   PLT 193 07/16/2007   Lab Results  Component Value Date   NA 140 04/17/2019   K 4.2 04/17/2019   CO2 22 07/16/2007   GLUCOSE 126 (H) 04/17/2019   BUN 23 04/17/2019   CREATININE 1.30 (H) 04/17/2019   BILITOT 0.5 07/16/2007   ALKPHOS 72 07/16/2007   AST 27 07/16/2007   ALT 32 07/16/2007   PROT 7.6 07/16/2007   ALBUMIN 3.9 07/16/2007   CALCIUM 8.9 07/16/2007   Lab Results  Component Value Date   CHOL 154 07/16/2007   Lab Results  Component Value Date   HDL 54 07/16/2007   Lab Results  Component Value Date   LDLCALC 80 07/16/2007   Lab Results  Component Value Date   TRIG 100 07/16/2007   Lab Results  Component Value Date   CHOLHDL 2.9 Ratio 07/16/2007   Lab Results  Component Value Date   HGBA1C 8.0 07/01/2008      Assessment  & Plan:   Problem List Items Addressed This Visit       Cardiovascular and Mediastinum   Essential hypertension - Primary    BP Readings from Last 1 Encounters:  10/22/20 124/78  Well-controlled with Amlodipine, Losartan-HCTZ and Metoprolol Counseled for compliance with the medications Advised DASH diet and moderate exercise/walking, at least 150 mins/week       Other Visit Diagnoses     Skin rash     Likely contact dermatitis Kenalog cream prescribed   Relevant Medications   triamcinolone cream (KENALOG) 0.1 %   Fall, subsequent encounter     X-ray of the lumbar spine reviewed - no acute fracture Pain now better with improved mobility       Meds ordered this encounter  Medications   triamcinolone cream (KENALOG) 0.1 %    Sig: Apply 1 application topically 2 (two) times daily.    Dispense:  30 g    Refill:  0    Follow-up: Return in about 4 months (around 02/21/2021) for HTN and RA.    [name], MD

## 2020-10-24 LAB — CMP14+EGFR
ALT: 55 IU/L — ABNORMAL HIGH (ref 0–44)
AST: 54 IU/L — ABNORMAL HIGH (ref 0–40)
Albumin/Globulin Ratio: 1.2 (ref 1.2–2.2)
Albumin: 4.1 g/dL (ref 3.7–4.7)
Alkaline Phosphatase: 91 IU/L (ref 44–121)
BUN/Creatinine Ratio: 13 (ref 10–24)
BUN: 17 mg/dL (ref 8–27)
Bilirubin Total: 0.6 mg/dL (ref 0.0–1.2)
CO2: 22 mmol/L (ref 20–29)
Calcium: 9.3 mg/dL (ref 8.6–10.2)
Chloride: 101 mmol/L (ref 96–106)
Creatinine, Ser: 1.36 mg/dL — ABNORMAL HIGH (ref 0.76–1.27)
Globulin, Total: 3.5 g/dL (ref 1.5–4.5)
Glucose: 145 mg/dL — ABNORMAL HIGH (ref 65–99)
Potassium: 4.3 mmol/L (ref 3.5–5.2)
Sodium: 139 mmol/L (ref 134–144)
Total Protein: 7.6 g/dL (ref 6.0–8.5)
eGFR: 55 mL/min/{1.73_m2} — ABNORMAL LOW (ref >59)

## 2020-10-24 LAB — CBC WITH DIFFERENTIAL/PLATELET
Basophils Absolute: 0 10*3/uL (ref 0.0–0.2)
Basos: 0 %
EOS (ABSOLUTE): 0.1 10*3/uL (ref 0.0–0.4)
Eos: 2 %
Hematocrit: 39.5 % (ref 37.5–51.0)
Hemoglobin: 13.6 g/dL (ref 13.0–17.7)
Immature Grans (Abs): 0 10*3/uL (ref 0.0–0.1)
Immature Granulocytes: 1 %
Lymphocytes Absolute: 1.5 10*3/uL (ref 0.7–3.1)
Lymphs: 26 %
MCH: 30.1 pg (ref 26.6–33.0)
MCHC: 34.4 g/dL (ref 31.5–35.7)
MCV: 87 fL (ref 79–97)
Monocytes Absolute: 0.9 10*3/uL (ref 0.1–0.9)
Monocytes: 15 %
Neutrophils Absolute: 3.3 10*3/uL (ref 1.4–7.0)
Neutrophils: 56 %
Platelets: 152 10*3/uL (ref 150–450)
RBC: 4.52 x10E6/uL (ref 4.14–5.80)
RDW: 15 % (ref 11.6–15.4)
WBC: 5.8 10*3/uL (ref 3.4–10.8)

## 2020-10-24 LAB — LIPID PANEL
Chol/HDL Ratio: 4.1 ratio (ref 0.0–5.0)
Cholesterol, Total: 217 mg/dL — ABNORMAL HIGH (ref 100–199)
HDL: 53 mg/dL (ref >39)
LDL Chol Calc (NIH): 139 mg/dL — ABNORMAL HIGH (ref 0–99)
Triglycerides: 140 mg/dL (ref 0–149)
VLDL Cholesterol Cal: 25 mg/dL (ref 5–40)

## 2020-10-24 LAB — HEMOGLOBIN A1C
Est. average glucose Bld gHb Est-mCnc: 174 mg/dL
Hgb A1c MFr Bld: 7.7 % — ABNORMAL HIGH (ref 4.8–5.6)

## 2020-10-24 LAB — VITAMIN D 25 HYDROXY (VIT D DEFICIENCY, FRACTURES): Vit D, 25-Hydroxy: 23.6 ng/mL — ABNORMAL LOW (ref 30.0–100.0)

## 2020-10-24 LAB — HEPATITIS C ANTIBODY: Hep C Virus Ab: 0.2 s/co ratio (ref 0.0–0.9)

## 2020-10-24 LAB — TSH: TSH: 5.23 u[IU]/mL — ABNORMAL HIGH (ref 0.450–4.500)

## 2020-10-24 LAB — HIV ANTIBODY (ROUTINE TESTING W REFLEX): HIV Screen 4th Generation wRfx: NONREACTIVE

## 2020-10-26 ENCOUNTER — Encounter: Payer: Self-pay | Admitting: Family Medicine

## 2020-10-26 ENCOUNTER — Ambulatory Visit (INDEPENDENT_AMBULATORY_CARE_PROVIDER_SITE_OTHER): Payer: Medicare Other | Admitting: Family Medicine

## 2020-10-26 ENCOUNTER — Other Ambulatory Visit: Payer: Self-pay

## 2020-10-26 VITALS — BP 142/82 | HR 78 | Resp 16 | Ht 65.0 in | Wt 231.0 lb

## 2020-10-26 DIAGNOSIS — I10 Essential (primary) hypertension: Secondary | ICD-10-CM | POA: Diagnosis not present

## 2020-10-26 DIAGNOSIS — N183 Chronic kidney disease, stage 3 unspecified: Secondary | ICD-10-CM | POA: Diagnosis not present

## 2020-10-26 DIAGNOSIS — N3001 Acute cystitis with hematuria: Secondary | ICD-10-CM | POA: Diagnosis not present

## 2020-10-26 DIAGNOSIS — E1122 Type 2 diabetes mellitus with diabetic chronic kidney disease: Secondary | ICD-10-CM | POA: Diagnosis not present

## 2020-10-26 DIAGNOSIS — N390 Urinary tract infection, site not specified: Secondary | ICD-10-CM | POA: Insufficient documentation

## 2020-10-26 LAB — POCT URINALYSIS DIP (CLINITEK)
Bilirubin, UA: NEGATIVE
Glucose, UA: NEGATIVE mg/dL
Ketones, POC UA: NEGATIVE mg/dL
Nitrite, UA: NEGATIVE
POC PROTEIN,UA: NEGATIVE
Spec Grav, UA: 1.02 (ref 1.010–1.025)
Urobilinogen, UA: 0.2 E.U./dL
pH, UA: 5.5 (ref 5.0–8.0)

## 2020-10-26 MED ORDER — DOXYCYCLINE HYCLATE 100 MG PO TABS: 100.0000 mg | ORAL_TABLET | Freq: Two times a day (BID) | ORAL | 0 refills | Status: DC

## 2020-10-26 NOTE — Progress Notes (Signed)
Jonathan [name]     MRN: [id number]      DOB: 08-21-1945   HPI Jonathan [name] 4 day h/o frequency and dysuria, denies fever , or chills, no N/V or appetite change Had UTI 1 year ago, has prostate issues for which he had surgery which has been beneficial, poor outflow being the issue  ROS Denies recent fever or chills. Denies sinus pressure, nasal congestion, ear pain or sore throat. Denies chest congestion, productive cough or wheezing. Denies chest pains, palpitations and leg swelling Denies abdominal pain, nausea, vomiting,diarrhea or constipation.    .   PE  BP (!) 152/82   Pulse 78   Resp 16   Ht 5\' 5"  (1.651 m)   Wt 231 lb (104.8 kg)   SpO2 96%   BMI 38.44 kg/m   Patient alert and oriented and in no cardiopulmonary distress.  HEENT: No facial asymmetry, EOMI,     Neck supple .  Chest: Clear to auscultation bilaterally.  CVS: S1, S2 no murmurs, no S3.Regular rate.  ABD: Soft non tender.   Ext: No edema  MS: Adequate ROM spine, shoulders, hips and knees.   Psych: Good eye contact, normal affect. Memory intact not anxious or depressed appearing.  CNS: CN 2-12 intact, power,  normal throughout.no focal deficits noted.   Assessment & Plan  Acute cystitis with hematuria Symptomatic with abn CCUA, treat presumptively an f/u culture, may need Urology eval, will defer to PCP  Essential hypertension Uncontrolled at this voisor, no med change DASH diet and commitment to daily physical activity for a minimum of 30 minutes discussed and encouraged, as a part of hypertension management. The importance of attaining a healthy weight is also discussed.  BP/Weight 10/26/2020 10/22/2020 10/20/2020 09/24/2020 04/17/2019 08/09/7351 06/10/9240  Systolic BP 683 419 622 297 989 - 211  Diastolic BP 82 78 76 75 73 - 75  Wt. (Lbs) 231 233.4 232.12 234 219.5 232 240  BMI 38.44 38.84 34.28 34.56 32.41 34.26 35.44       Hyperlipidemia LDL goal <100 Hyperlipidemia:Low fat diet  discussed and encouraged.   Lipid Panel  Lab Results  Component Value Date   CHOL 217 (H) 10/23/2020   HDL 53 10/23/2020   LDLCALC 139 (H) 10/23/2020   TRIG 140 10/23/2020   CHOLHDL 4.1 10/23/2020     Uncontrolled and not at goal, needs to lower fat intake  Diabetes [facility]) Jonathan [name] is reminded of the importance of commitment to daily physical activity for 30 minutes or more, as able and the need to limit carbohydrate intake to 30 to 60 grams per meal to help with blood sugar control.   The need to take medication as prescribed, test blood sugar as directed, and to call between visits if there is a concern that blood sugar is uncontrolled is also discussed.   Jonathan [name] is reminded of the importance of daily foot exam, annual eye examination, and good blood sugar, blood pressure and cholesterol control.  Diabetic Labs Latest Ref Rng & Units 10/23/2020 04/17/2019 07/01/2008 12/28/2007 07/16/2007  HbA1c 4.8 - 5.6 % 7.7(H) - 8.0 7.0 7.1(H)  Microalbumin 0.00 - 1.89 mg/dL - - - - -  Micro/Creat Ratio 0.0 - 30.0 mg/g - - - - -  Chol 100 - 199 mg/dL 217(H) - - - 154  HDL >39 mg/dL 53 - - - 54  Calc LDL 0 - 99 mg/dL 139(H) - - - 80  Triglycerides 0 - 149 mg/dL 140 - - -  100  Creatinine 0.76 - 1.27 mg/dL 1.36(H) 1.30(H) - - 1.42   BP/Weight 10/26/2020 10/22/2020 10/20/2020 09/24/2020 04/17/2019 7/62/8315 05/08/6158  Systolic BP 737 106 269 485 462 - 703  Diastolic BP 82 78 76 75 73 - 75  Wt. (Lbs) 231 233.4 232.12 234 219.5 232 240  BMI 38.44 38.84 34.28 34.56 32.41 34.26 35.44   Foot/eye exam completion dates 09/24/2020  Foot Form Completion Done   Improved , which is excellent

## 2020-10-26 NOTE — Patient Instructions (Signed)
F/U with Dr Posey Pronto early September , re evaluate blood pressure and lab review  10 day course of antibiotic, doxycyline is prescribed for UTI, medication is at [facility] in [place]  Work on reduced fried and fatty foods, increased vegetables, exercise 5 days / week and weight loss  Thanks for choosing [facility], we consider it a privelige to serve you.

## 2020-10-28 ENCOUNTER — Other Ambulatory Visit: Payer: Self-pay | Admitting: Internal Medicine

## 2020-10-28 DIAGNOSIS — I10 Essential (primary) hypertension: Secondary | ICD-10-CM

## 2020-10-29 LAB — URINE CULTURE: Organism ID, Bacteria: NO GROWTH

## 2020-11-02 ENCOUNTER — Encounter: Payer: Self-pay | Admitting: Family Medicine

## 2020-11-02 ENCOUNTER — Telehealth: Payer: Self-pay | Admitting: Family Medicine

## 2020-11-02 NOTE — Assessment & Plan Note (Signed)
Mr. Reindl is reminded of the importance of commitment to daily physical activity for 30 minutes or more, as able and the need to limit carbohydrate intake to 30 to 60 grams per meal to help with blood sugar control.   The need to take medication as prescribed, test blood sugar as directed, and to call between visits if there is a concern that blood sugar is uncontrolled is also discussed.   Mr. Malczewski is reminded of the importance of daily foot exam, annual eye examination, and good blood sugar, blood pressure and cholesterol control.  Diabetic Labs Latest Ref Rng & Units 10/23/2020 04/17/2019 07/01/2008 12/28/2007 07/16/2007  HbA1c 4.8 - 5.6 % 7.7(H) - 8.0 7.0 7.1(H)  Microalbumin 0.00 - 1.89 mg/dL - - - - -  Micro/Creat Ratio 0.0 - 30.0 mg/g - - - - -  Chol 100 - 199 mg/dL 217(H) - - - 154  HDL >39 mg/dL 53 - - - 54  Calc LDL 0 - 99 mg/dL 139(H) - - - 80  Triglycerides 0 - 149 mg/dL 140 - - - 100  Creatinine 0.76 - 1.27 mg/dL 1.36(H) 1.30(H) - - 1.42   BP/Weight 10/26/2020 10/22/2020 10/20/2020 09/24/2020 04/17/2019 3/40/3709 10/03/3836  Systolic BP 184 037 543 606 770 - 340  Diastolic BP 82 78 76 75 73 - 75  Wt. (Lbs) 231 233.4 232.12 234 219.5 232 240  BMI 38.44 38.84 34.28 34.56 32.41 34.26 35.44   Foot/eye exam completion dates 09/24/2020  Foot Form Completion Done   Improved , which is excellent

## 2020-11-02 NOTE — Assessment & Plan Note (Signed)
Hyperlipidemia:Low fat diet discussed and encouraged.   Lipid Panel  Lab Results  Component Value Date   CHOL 217 (H) 10/23/2020   HDL 53 10/23/2020   LDLCALC 139 (H) 10/23/2020   TRIG 140 10/23/2020   CHOLHDL 4.1 10/23/2020     Uncontrolled and not at goal, needs to lower fat intake

## 2020-11-02 NOTE — Assessment & Plan Note (Signed)
Uncontrolled at this voisor, no med change DASH diet and commitment to daily physical activity for a minimum of 30 minutes discussed and encouraged, as a part of hypertension management. The importance of attaining a healthy weight is also discussed.  BP/Weight 10/26/2020 10/22/2020 10/20/2020 09/24/2020 04/17/2019 5/50/0164 06/10/377  Systolic BP 558 316 742 552 589 - 483  Diastolic BP 82 78 76 75 73 - 75  Wt. (Lbs) 231 233.4 232.12 234 219.5 232 240  BMI 38.44 38.84 34.28 34.56 32.41 34.26 35.44

## 2020-11-02 NOTE — Assessment & Plan Note (Addendum)
Symptomatic with abn CCUA, treat presumptively an f/u culture, may need Urology eval, will defer to PCP

## 2020-11-02 NOTE — Telephone Encounter (Signed)
Pt seen for acute cystitis, with hematuria, no documented UTI so he will need sooner f/u with you

## 2020-11-03 ENCOUNTER — Telehealth: Payer: Self-pay

## 2020-11-03 ENCOUNTER — Other Ambulatory Visit: Payer: Self-pay

## 2020-11-03 DIAGNOSIS — I10 Essential (primary) hypertension: Secondary | ICD-10-CM

## 2020-11-03 DIAGNOSIS — N400 Enlarged prostate without lower urinary tract symptoms: Secondary | ICD-10-CM

## 2020-11-03 MED ORDER — LOSARTAN POTASSIUM-HCTZ 100-25 MG PO TABS: 1.0000 | ORAL_TABLET | Freq: Every day | ORAL | 0 refills | Status: DC

## 2020-11-03 MED ORDER — AMLODIPINE BESYLATE 10 MG PO TABS: 10.0000 mg | ORAL_TABLET | Freq: Every day | ORAL | 0 refills | Status: DC

## 2020-11-03 MED ORDER — GLIPIZIDE 5 MG PO TABS: 5.0000 mg | ORAL_TABLET | Freq: Two times a day (BID) | ORAL | 0 refills | Status: DC

## 2020-11-03 MED ORDER — TAMSULOSIN HCL 0.4 MG PO CAPS: 0.4000 mg | ORAL_CAPSULE | Freq: Every day | ORAL | 5 refills | Status: DC

## 2020-11-03 MED ORDER — OMEPRAZOLE 20 MG PO CPDR
20.0000 mg | DELAYED_RELEASE_CAPSULE | Freq: Every day | ORAL | 0 refills | Status: DC
Start: 2020-11-03 — End: 2021-10-21

## 2020-11-03 NOTE — Telephone Encounter (Signed)
Rx's sent in. Pt informed.

## 2020-11-03 NOTE — Telephone Encounter (Signed)
Pt made an appt 11-30-20 with Posey Pronto

## 2020-11-03 NOTE — Telephone Encounter (Signed)
Pt had professional movers come in, and they packed all of his medication up, and he cant locate it .. pt needs all medication called in to [facility] in [place] .Marland Kitchen

## 2020-11-12 ENCOUNTER — Other Ambulatory Visit: Payer: Self-pay

## 2020-11-12 ENCOUNTER — Ambulatory Visit (INDEPENDENT_AMBULATORY_CARE_PROVIDER_SITE_OTHER): Payer: Medicare Other | Admitting: *Deleted

## 2020-11-12 DIAGNOSIS — Z0000 Encounter for general adult medical examination without abnormal findings: Secondary | ICD-10-CM

## 2020-11-12 NOTE — Progress Notes (Addendum)
Subjective:   Jonathan [name] is a 75 y.o. male who presents for Medicare Annual/Subsequent preventive examination.  Review of Systems           Objective:    There were no vitals filed for this visit. There is no height or weight on file to calculate BMI.  Advanced Directives 04/17/2019 12/09/2015  Does Patient Have a Medical Advance Directive? No No  Would patient like information on creating a medical advance directive? No - Patient declined No - patient declined information    Current Medications (verified) Outpatient Encounter Medications as of 11/12/2020  Medication Sig   amLODipine (NORVASC) 10 MG tablet Take 1 tablet (10 mg total) by mouth daily.   aspirin EC 81 MG tablet Take 81 mg by mouth daily. Swallow whole.   Cyanocobalamin 2500 MCG CHEW Chew by mouth daily.   doxycycline (VIBRA-TABS) 100 MG tablet Take 1 tablet (100 mg total) by mouth 2 (two) times daily.   folic acid (FOLVITE) 1 MG tablet Take 1 mg by mouth daily.   glipiZIDE (GLUCOTROL) 5 MG tablet Take 1 tablet (5 mg total) by mouth 2 (two) times daily before a meal.   losartan-hydrochlorothiazide (HYZAAR) 100-25 MG tablet Take 1 tablet by mouth daily.   methocarbamol (ROBAXIN) 500 MG tablet Take 1 tablet (500 mg total) by mouth every 6 (six) hours as needed for muscle spasms.   methotrexate (RHEUMATREX) 2.5 MG tablet Caution:Chemotherapy. Protect from light. Take 5 tablets PO once weekly.   metoprolol succinate (TOPROL-XL) 50 MG 24 hr tablet Take 50 mg by mouth 2 (two) times daily. Take with or immediately following a meal.   omeprazole (PRILOSEC) 20 MG capsule Take 1 capsule (20 mg total) by mouth daily.   predniSONE (DELTASONE) 2.5 MG tablet Take 2.5 mg by mouth daily with breakfast.   tamsulosin (FLOMAX) 0.4 MG CAPS capsule Take 1 capsule (0.4 mg total) by mouth daily after supper.   triamcinolone cream (KENALOG) 0.1 % Apply 1 application topically 2 (two) times daily.   No facility-administered encounter  medications on file as of 11/12/2020.    Allergies (verified) Ciprofloxacin and Oxycodone-acetaminophen   History: Past Medical History:  Diagnosis Date   Arthritis    ra, sees dr Sedonia Small   Coronary artery disease    saw dr zachery in [place] 20 yrs ago, released from cardiology   Diabetes mellitus without complication ([place])    type 2   Hypertension    Urethral stricture    Past Surgical History:  Procedure Laterality Date   BACK SURGERY     x 3 lower back   CARDIAC CATHETERIZATION  2000   small amt plaque relased from cardiology 20 yrs ago   [facility] N/A 04/17/2019   Procedure: CYSTOSCOPY WITH URETHRAL DILATATION, CYSTOGRAM;  Surgeon: Ceasar Mons, MD;  Location: [facility];  Service: Urology;  Laterality: N/A;   EYE SURGERY Bilateral    cataracts   urethral stricture surgery  15 yrs ago   No family history on file. Social History   Socioeconomic History   Marital status: Widowed    Spouse name: Not on file   Number of children: Not on file   Years of education: Not on file   Highest education level: Not on file  Occupational History   Not on file  Tobacco Use   Smoking status: Never   Smokeless tobacco: Never  Vaping Use   Vaping  Use: Never used  Substance and Sexual Activity   Alcohol use: No   Drug use: No   Sexual activity: Not on file  Other Topics Concern   Not on file  Social History Narrative   Not on file   Social Determinants of Health   Financial Resource Strain: Not on file  Food Insecurity: Not on file  Transportation Needs: Not on file  Physical Activity: Not on file  Stress: Not on file  Social Connections: Not on file    Tobacco Counseling Counseling given: Not Answered   Clinical Intake:                 Diabetic?Yes         Activities of Daily Living In your present state of health, do you have any difficulty performing the  following activities: 10/20/2020  Hearing? N  Vision? N  Difficulty concentrating or making decisions? N  Walking or climbing stairs? Y  Dressing or bathing? N  Doing errands, shopping? N  Some recent data might be hidden    Patient Care Team: [name], MD as PCP - General (Internal Medicine)  Indicate any recent Medical Services you may have received from other than [facility] providers in the past year (date may be approximate).     Assessment:   This is a routine wellness examination for August.  Hearing/Vision screen No results found.  Dietary issues and exercise activities discussed:     Goals Addressed   None   Depression Screen PHQ 2/9 Scores 10/26/2020 10/22/2020 10/20/2020 09/24/2020  PHQ - 2 Score 0 0 0 0    Fall Risk Fall Risk  10/26/2020 10/22/2020 10/20/2020 09/24/2020  Falls in the past year? 1 1 1  0  Number falls in past yr: 0 1 0 0  Injury with Fall? 0 1 1 0  Risk for fall due to : - History of fall(s);Impaired balance/gait Impaired balance/gait;History of fall(s) No Fall Risks  Follow up - Falls evaluation completed;Education provided;Falls prevention discussed;Follow up appointment Falls evaluation completed;Education provided;Falls prevention discussed;Follow up appointment Falls evaluation completed    FALL RISK PREVENTION PERTAINING TO THE HOME:  Any stairs in or around the home? No  If so, are there any without handrails? No  Home free of loose throw rugs in walkways, pet beds, electrical cords, etc? Yes  Adequate lighting in your home to reduce risk of falls? Yes   ASSISTIVE DEVICES UTILIZED TO PREVENT FALLS:  Life alert? No  Use of a cane, walker or w/c? Yes  Grab bars in the bathroom? Yes  Shower chair or bench in shower? No  Elevated toilet seat or a handicapped toilet? No   TIMED UP AND GO:  Was the test performed? No .  Length of time to ambulate 10 feet: NA sec.     Cognitive Function:        Immunizations Immunization  History  Administered Date(s) Administered   Influenza Whole 02/21/2006, 04/16/2007, 02/15/2008   Influenza, High Dose Seasonal PF 01/28/2019   Influenza-Unspecified 01/30/2018   PFIZER(Purple Top)SARS-COV-2 Vaccination 07/04/2019, 07/31/2019    TDAP status: Due, Education has been provided regarding the importance of this vaccine. Advised may receive this vaccine at local pharmacy or Health Dept. Aware to provide a copy of the vaccination record if obtained from local pharmacy or Health Dept. Verbalized acceptance and understanding.  Flu Vaccine status: Due, Education has been provided regarding the importance of this vaccine. Advised may receive this vaccine at local pharmacy  or Health Dept. Aware to provide a copy of the vaccination record if obtained from local pharmacy or Health Dept. Verbalized acceptance and understanding.  Pneumococcal vaccine status: Due, Education has been provided regarding the importance of this vaccine. Advised may receive this vaccine at local pharmacy or Health Dept. Aware to provide a copy of the vaccination record if obtained from local pharmacy or Health Dept. Verbalized acceptance and understanding.  Covid-19 vaccine status: Completed vaccines  Qualifies for Shingles Vaccine? Yes   Zostavax completed No Shingrix Completed?: No.    Education has been provided regarding the importance of this vaccine. Patient has been advised to call insurance company to determine out of pocket expense if they have not yet received this vaccine. Advised may also receive vaccine at local pharmacy or Health Dept. Verbalized acceptance and understanding.  Screening Tests Health Maintenance  Topic Date Due   OPHTHALMOLOGY EXAM  Never done   TETANUS/TDAP  Never done   COLONOSCOPY (Pts 45-63yrs Insurance coverage will need to be confirmed)  Never done   Zoster Vaccines- Shingrix (1 of 2) Never done   PNA vac Low Risk Adult (1 of 2 - PCV13) Never done   COVID-19 Vaccine (3 -  Booster for Pfizer series) 12/31/2019   INFLUENZA VACCINE  11/30/2020   HEMOGLOBIN A1C  04/24/2021   FOOT EXAM  09/24/2021   Hepatitis C Screening  Completed   HPV VACCINES  Aged Out    Health Maintenance  Health Maintenance Due  Topic Date Due   OPHTHALMOLOGY EXAM  Never done   TETANUS/TDAP  Never done   COLONOSCOPY (Pts 45-22yrs Insurance coverage will need to be confirmed)  Never done   Zoster Vaccines- Shingrix (1 of 2) Never done   PNA vac Low Risk Adult (1 of 2 - PCV13) Never done   COVID-19 Vaccine (3 - Booster for Pfizer series) 12/31/2019      Lung Cancer Screening: (Low Dose CT Chest recommended if Age 61-80 years, 30 pack-year currently smoking OR have quit w/in 15years.) does not qualify.   Lung Cancer Screening Referral: No  Additional Screening:  Hepatitis C Screening: does qualify; Completed 10-23-20  Vision Screening: Recommended annual ophthalmology exams for early detection of glaucoma and other disorders of the eye. Is the patient up to date with their annual eye exam?  Yes  Who is the provider or what is the name of the office in which the patient attends annual eye exams? [facility]  If pt is not established with a provider, would they like to be referred to a provider to establish care? No .   Dental Screening: Recommended annual dental exams for proper oral hygiene  Community Resource Referral / Chronic Care Management: CRR required this visit?  No   CCM required this visit?  No      Plan:     I have personally reviewed and noted the following in the patient's chart:   Medical and social history Use of alcohol, tobacco or illicit drugs  Current medications and supplements including opioid prescriptions. Patient is not currently taking opioid prescriptions. Functional ability and status Nutritional status Physical activity Advanced directives List of other physicians Hospitalizations, surgeries, and ER visits in previous 12  months Vitals Screenings to include cognitive, depression, and falls Referrals and appointments  In addition, I have reviewed and discussed with patient certain preventive protocols, quality metrics, and best practice recommendations. A written personalized care plan for preventive services as well as general preventive health  recommendations were provided to patient.     Shelda Altes, CMA   11/12/2020   Nurse Notes:   I have reviewed and agree with the above AWV documentation. Norwood Levo. Moshe Cipro, MD [facility] November 12, 2020 . 8:07 PM

## 2020-11-12 NOTE — Patient Instructions (Signed)
Jonathan [name] , Thank you for taking time to come for your Medicare Wellness Visit. I appreciate your ongoing commitment to your health goals. Please review the following plan we discussed and let me know if I can assist you in the future.   Screening recommendations/referrals: Colonoscopy: Will discuss with provider Recommended yearly ophthalmology/optometry visit for glaucoma screening and checkup Recommended yearly dental visit for hygiene and checkup  Vaccinations: Influenza vaccine: Due 11-30-20 Pneumococcal vaccine: Due now  Tdap vaccine: Due now Shingles vaccine: Due now     Advanced directives: Information declined  Conditions/risks identified: Hypertension, diabetes  Next appointment: 1 year   Preventive Care 48 Years and Older, Male Preventive care refers to lifestyle choices and visits with your health care provider that can promote health and wellness. What does preventive care include? A yearly physical exam. This is also called an annual well check. Dental exams once or twice a year. Routine eye exams. Ask your health care provider how often you should have your eyes checked. Personal lifestyle choices, including: Daily care of your teeth and gums. Regular physical activity. Eating a healthy diet. Avoiding tobacco and drug use. Limiting alcohol use. Practicing safe sex. Taking low doses of aspirin every day. Taking vitamin and mineral supplements as recommended by your health care provider. What happens during an annual well check? The services and screenings done by your health care provider during your annual well check will depend on your age, overall health, lifestyle risk factors, and family history of disease. Counseling  Your health care provider may ask you questions about your: Alcohol use. Tobacco use. Drug use. Emotional well-being. Home and relationship well-being. Sexual activity. Eating habits. History of falls. Memory and ability to understand  (cognition). Work and work Statistician. Screening  You may have the following tests or measurements: Height, weight, and BMI. Blood pressure. Lipid and cholesterol levels. These may be checked every 5 years, or more frequently if you are over 64 years old. Skin check. Lung cancer screening. You may have this screening every year starting at age 66 if you have a 30-pack-year history of smoking and currently smoke or have quit within the past 15 years. Fecal occult blood test (FOBT) of the stool. You may have this test every year starting at age 58. Flexible sigmoidoscopy or colonoscopy. You may have a sigmoidoscopy every 5 years or a colonoscopy every 10 years starting at age 63. Prostate cancer screening. Recommendations will vary depending on your family history and other risks. Hepatitis C blood test. Hepatitis B blood test. Sexually transmitted disease (STD) testing. Diabetes screening. This is done by checking your blood sugar (glucose) after you have not eaten for a while (fasting). You may have this done every 1-3 years. Abdominal aortic aneurysm (AAA) screening. You may need this if you are a current or former smoker. Osteoporosis. You may be screened starting at age 72 if you are at high risk. Talk with your health care provider about your test results, treatment options, and if necessary, the need for more tests. Vaccines  Your health care provider may recommend certain vaccines, such as: Influenza vaccine. This is recommended every year. Tetanus, diphtheria, and acellular pertussis (Tdap, Td) vaccine. You may need a Td booster every 10 years. Zoster vaccine. You may need this after age 71. Pneumococcal 13-valent conjugate (PCV13) vaccine. One dose is recommended after age 28. Pneumococcal polysaccharide (PPSV23) vaccine. One dose is recommended after age 1. Talk to your health care provider about which screenings and vaccines  you need and how often you need them. This  information is not intended to replace advice given to you by your health care provider. Make sure you discuss any questions you have with your health care provider. Document Released: 05/15/2015 Document Revised: 01/06/2016 Document Reviewed: 02/17/2015 Elsevier Interactive Patient Education  2017 [place] Prevention in the Home Falls can cause injuries. They can happen to people of all ages. There are many things you can do to make your home safe and to help prevent falls. What can I do on the outside of my home? Regularly fix the edges of walkways and driveways and fix any cracks. Remove anything that might make you trip as you walk through a door, such as a raised step or threshold. Trim any bushes or trees on the path to your home. Use bright outdoor lighting. Clear any walking paths of anything that might make someone trip, such as rocks or tools. Regularly check to see if handrails are loose or broken. Make sure that both sides of any steps have handrails. Any raised decks and porches should have guardrails on the edges. Have any leaves, snow, or ice cleared regularly. Use sand or salt on walking paths during winter. Clean up any spills in your garage right away. This includes oil or grease spills. What can I do in the bathroom? Use night lights. Install grab bars by the toilet and in the tub and shower. Do not use towel bars as grab bars. Use non-skid mats or decals in the tub or shower. If you need to sit down in the shower, use a plastic, non-slip stool. Keep the floor dry. Clean up any water that spills on the floor as soon as it happens. Remove soap buildup in the tub or shower regularly. Attach bath mats securely with double-sided non-slip rug tape. Do not have throw rugs and other things on the floor that can make you trip. What can I do in the bedroom? Use night lights. Make sure that you have a light by your bed that is easy to reach. Do not use any sheets or  blankets that are too big for your bed. They should not hang down onto the floor. Have a firm chair that has side arms. You can use this for support while you get dressed. Do not have throw rugs and other things on the floor that can make you trip. What can I do in the kitchen? Clean up any spills right away. Avoid walking on wet floors. Keep items that you use a lot in easy-to-reach places. If you need to reach something above you, use a strong step stool that has a grab bar. Keep electrical cords out of the way. Do not use floor polish or wax that makes floors slippery. If you must use wax, use non-skid floor wax. Do not have throw rugs and other things on the floor that can make you trip. What can I do with my stairs? Do not leave any items on the stairs. Make sure that there are handrails on both sides of the stairs and use them. Fix handrails that are broken or loose. Make sure that handrails are as long as the stairways. Check any carpeting to make sure that it is firmly attached to the stairs. Fix any carpet that is loose or worn. Avoid having throw rugs at the top or bottom of the stairs. If you do have throw rugs, attach them to the floor with carpet tape. Make sure  that you have a light switch at the top of the stairs and the bottom of the stairs. If you do not have them, ask someone to add them for you. What else can I do to help prevent falls? Wear shoes that: Do not have high heels. Have rubber bottoms. Are comfortable and fit you well. Are closed at the toe. Do not wear sandals. If you use a stepladder: Make sure that it is fully opened. Do not climb a closed stepladder. Make sure that both sides of the stepladder are locked into place. Ask someone to hold it for you, if possible. Clearly mark and make sure that you can see: Any grab bars or handrails. First and last steps. Where the edge of each step is. Use tools that help you move around (mobility aids) if they are  needed. These include: Canes. Walkers. Scooters. Crutches. Turn on the lights when you go into a dark area. Replace any light bulbs as soon as they burn out. Set up your furniture so you have a clear path. Avoid moving your furniture around. If any of your floors are uneven, fix them. If there are any pets around you, be aware of where they are. Review your medicines with your doctor. Some medicines can make you feel dizzy. This can increase your chance of falling. Ask your doctor what other things that you can do to help prevent falls. This information is not intended to replace advice given to you by your health care provider. Make sure you discuss any questions you have with your health care provider. Document Released: 02/12/2009 Document Revised: 09/24/2015 Document Reviewed: 05/23/2014 Elsevier Interactive Patient Education  2017 [employer].

## 2020-11-30 ENCOUNTER — Ambulatory Visit (INDEPENDENT_AMBULATORY_CARE_PROVIDER_SITE_OTHER): Payer: Medicare Other | Admitting: Internal Medicine

## 2020-11-30 ENCOUNTER — Other Ambulatory Visit: Payer: Self-pay

## 2020-11-30 ENCOUNTER — Encounter: Payer: Self-pay | Admitting: Internal Medicine

## 2020-11-30 VITALS — BP 140/75 | HR 64 | Resp 18 | Ht 69.0 in | Wt 231.8 lb

## 2020-11-30 DIAGNOSIS — I10 Essential (primary) hypertension: Secondary | ICD-10-CM

## 2020-11-30 DIAGNOSIS — N1831 Chronic kidney disease, stage 3a: Secondary | ICD-10-CM

## 2020-11-30 DIAGNOSIS — N3000 Acute cystitis without hematuria: Secondary | ICD-10-CM | POA: Diagnosis not present

## 2020-11-30 DIAGNOSIS — E038 Other specified hypothyroidism: Secondary | ICD-10-CM

## 2020-11-30 DIAGNOSIS — N183 Chronic kidney disease, stage 3 unspecified: Secondary | ICD-10-CM

## 2020-11-30 DIAGNOSIS — N400 Enlarged prostate without lower urinary tract symptoms: Secondary | ICD-10-CM | POA: Diagnosis not present

## 2020-11-30 DIAGNOSIS — E1122 Type 2 diabetes mellitus with diabetic chronic kidney disease: Secondary | ICD-10-CM

## 2020-11-30 MED ORDER — LOSARTAN POTASSIUM-HCTZ 100-25 MG PO TABS: 1.0000 | ORAL_TABLET | Freq: Every day | ORAL | 0 refills | Status: DC

## 2020-11-30 NOTE — Progress Notes (Signed)
Established Patient Office Visit  Subjective:  Patient ID: Jonathan [name], male    DOB: January 20, 1946  Age: 75 y.o. MRN: [id number]  CC:  Chief Complaint  Patient presents with   Follow-up    Follow up pt was having burning when he urinated however this is better now     HPI Jonathan [name] a 75 year old male with PMH of HTN, DM, GERD, RA, CKD stage 3, BPH and obesity who presents for follow up after episode of UTI.  He was treated with Doxycycline for UTI. He denies any dysuria or hematuria currently. Denies any fever, chills, nausea, vomiting or flank pain. He used to follow up with Urology in the past and has h/o urethral stricture.  Past Medical History:  Diagnosis Date   Arthritis    ra, sees dr Sedonia Small   Coronary artery disease    saw dr zachery in [place] 20 yrs ago, released from cardiology   Diabetes mellitus without complication ([place])    type 2   Hypertension    Urethral stricture     Past Surgical History:  Procedure Laterality Date   BACK SURGERY     x 3 lower back   CARDIAC CATHETERIZATION  2000   small amt plaque relased from cardiology 20 yrs ago   [place] N/A 04/17/2019   Procedure: CYSTOSCOPY WITH URETHRAL DILATATION, CYSTOGRAM;  Surgeon: Ceasar Mons, MD;  Location: [facility];  Service: Urology;  Laterality: N/A;   EYE SURGERY Bilateral    cataracts   urethral stricture surgery  15 yrs ago    History reviewed. No pertinent family history.  Social History   Socioeconomic History   Marital status: Widowed    Spouse name: Not on file   Number of children: Not on file   Years of education: Not on file   Highest education level: Not on file  Occupational History   Not on file  Tobacco Use   Smoking status: Never   Smokeless tobacco: Never  Vaping Use   Vaping Use: Never used  Substance and Sexual Activity   Alcohol use: No   Drug use: No   Sexual  activity: Not on file  Other Topics Concern   Not on file  Social History Narrative   Not on file   Social Determinants of Health   Financial Resource Strain: Low Risk    Difficulty of Paying Living Expenses: Not hard at all  Food Insecurity: No Food Insecurity   Worried About Running Out of Food in the Last Year: Never true   [place] in the Last Year: Never true  Transportation Needs: No Transportation Needs   Lack of Transportation (Medical): No   Lack of Transportation (Non-Medical): No  Physical Activity: Insufficiently Active   Days of Exercise per Week: 3 days   Minutes of Exercise per Session: 30 min  Stress: No Stress Concern Present   Feeling of Stress : Not at all  Social Connections: Moderately Integrated   Frequency of Communication with Friends and Family: More than three times a week   Frequency of Social Gatherings with Friends and Family: More than three times a week   Attends Religious Services: More than 4 times per year   Active Member of [employer] or Organizations: No   Attends Archivist Meetings: Never   Marital Status: Married  Human resources officer Violence: Not At Risk   Fear of  Current or Ex-Partner: No   Emotionally Abused: No   Physically Abused: No   Sexually Abused: No    Outpatient Medications Prior to Visit  Medication Sig Dispense Refill   amLODipine (NORVASC) 10 MG tablet Take 1 tablet (10 mg total) by mouth daily. 30 tablet 0   aspirin EC 81 MG tablet Take 81 mg by mouth daily. Swallow whole.     Cyanocobalamin 2500 MCG CHEW Chew by mouth daily.     folic acid (FOLVITE) 1 MG tablet Take 1 mg by mouth daily.     glipiZIDE (GLUCOTROL) 5 MG tablet Take 1 tablet (5 mg total) by mouth 2 (two) times daily before a meal. 30 tablet 0   methocarbamol (ROBAXIN) 500 MG tablet Take 1 tablet (500 mg total) by mouth every 6 (six) hours as needed for muscle spasms. 30 tablet 0   methotrexate (RHEUMATREX) 2.5 MG tablet Caution:Chemotherapy.  Protect from light. Take 5 tablets PO once weekly.     metoprolol succinate (TOPROL-XL) 50 MG 24 hr tablet Take 50 mg by mouth 2 (two) times daily. Take with or immediately following a meal.     omeprazole (PRILOSEC) 20 MG capsule Take 1 capsule (20 mg total) by mouth daily. 30 capsule 0   predniSONE (DELTASONE) 2.5 MG tablet Take 2.5 mg by mouth daily with breakfast.     tamsulosin (FLOMAX) 0.4 MG CAPS capsule Take 1 capsule (0.4 mg total) by mouth daily after supper. 30 capsule 5   triamcinolone cream (KENALOG) 0.1 % Apply 1 application topically 2 (two) times daily. 30 g 0   losartan-hydrochlorothiazide (HYZAAR) 100-25 MG tablet Take 1 tablet by mouth daily. 30 tablet 0   No facility-administered medications prior to visit.    Allergies  Allergen Reactions   Ciprofloxacin    Oxycodone-Acetaminophen Rash    ROS Review of Systems  Constitutional:  Negative for chills and fever.  HENT:  Negative for congestion and sore throat.   Eyes:  Negative for pain and discharge.  Respiratory:  Negative for cough and shortness of breath.   Cardiovascular:  Negative for chest pain and palpitations.  Gastrointestinal:  Negative for constipation, diarrhea, nausea and vomiting.  Endocrine: Negative for polydipsia and polyuria.  Genitourinary:  Negative for dysuria and hematuria.  Musculoskeletal:  Positive for arthralgias and back pain. Negative for neck pain and neck stiffness.  Skin:  Negative for rash.  Neurological:  Negative for dizziness, weakness, numbness and headaches.  Psychiatric/Behavioral:  Negative for agitation and behavioral problems.      Objective:    Physical Exam Vitals reviewed.  Constitutional:      General: He is not in acute distress.    Appearance: He is obese. He is not diaphoretic.  HENT:     Head: Normocephalic and atraumatic.     Nose: Nose normal.     Mouth/Throat:     Mouth: Mucous membranes are moist.  Eyes:     General: No scleral icterus.     Extraocular Movements: Extraocular movements intact.  Cardiovascular:     Rate and Rhythm: Normal rate and regular rhythm.     Pulses: Normal pulses.     Heart sounds: Normal heart sounds. No murmur heard. Pulmonary:     Breath sounds: Normal breath sounds. No wheezing or rales.  Musculoskeletal:     Cervical back: Neck supple. No tenderness.     Right lower leg: No edema.     Left lower leg: No edema.  Skin:  General: Skin is warm.     Findings: No rash.  Neurological:     General: No focal deficit present.     Mental Status: He is alert and oriented to person, place, and time.  Psychiatric:        Mood and Affect: Mood normal.        Behavior: Behavior normal.    BP 140/75 (BP Location: Left Arm, Patient Position: Sitting, Cuff Size: Normal)   Pulse 64   Resp 18   Ht _0  (1.753 m)   Wt 231 lb 12.8 oz (105.1 kg)   SpO2 98%   BMI 34.23 kg/m  Wt Readings from Last 3 Encounters:  11/30/20 231 lb 12.8 oz (105.1 kg)  10/26/20 231 lb (104.8 kg)  10/22/20 233 lb 6.4 oz (105.9 kg)     Health Maintenance Due  Topic Date Due   OPHTHALMOLOGY EXAM  Never done   TETANUS/TDAP  Never done   COLONOSCOPY (Pts 45-29yr Insurance coverage will need to be confirmed)  Never done   Zoster Vaccines- Shingrix (1 of 2) Never done   PNA vac Low Risk Adult (1 of 2 - PCV13) Never done   COVID-19 Vaccine (3 - Booster for Pfizer series) 12/31/2019   INFLUENZA VACCINE  11/30/2020    There are no preventive care reminders to display for this patient.  Lab Results  Component Value Date   TSH 5.230 (H) 10/23/2020   Lab Results  Component Value Date   WBC 5.8 10/23/2020   HGB 13.6 10/23/2020   HCT 39.5 10/23/2020   MCV 87 10/23/2020   PLT 152 10/23/2020   Lab Results  Component Value Date   NA 139 10/23/2020   K 4.3 10/23/2020   CO2 22 10/23/2020   GLUCOSE 145 (H) 10/23/2020   BUN 17 10/23/2020   CREATININE 1.36 (H) 10/23/2020   BILITOT 0.6 10/23/2020   ALKPHOS 91 10/23/2020    AST 54 (H) 10/23/2020   ALT 55 (H) 10/23/2020   PROT 7.6 10/23/2020   ALBUMIN 4.1 10/23/2020   CALCIUM 9.3 10/23/2020   EGFR 55 (L) 10/23/2020   Lab Results  Component Value Date   CHOL 217 (H) 10/23/2020   Lab Results  Component Value Date   HDL 53 10/23/2020   Lab Results  Component Value Date   LDLCALC 139 (H) 10/23/2020   Lab Results  Component Value Date   TRIG 140 10/23/2020   Lab Results  Component Value Date   CHOLHDL 4.1 10/23/2020   Lab Results  Component Value Date   HGBA1C 7.7 (H) 10/23/2020      Assessment & Plan:   Problem List Items Addressed This Visit       Cardiovascular and Mediastinum   Essential hypertension    BP Readings from Last 1 Encounters:  11/30/20 140/75  Overall well-controlled with Amlodipine, Losartan-HCTZ and Metoprolol Counseled for compliance with the medications Advised DASH diet and moderate exercise/walking, at least 150 mins/week      Relevant Medications   losartan-hydrochlorothiazide (HYZAAR) 100-25 MG tablet     Endocrine   Diabetes (HCC)   Relevant Medications   losartan-hydrochlorothiazide (HYZAAR) 100-25 MG tablet   Other Relevant Orders   Hemoglobin A1c     Genitourinary   Chronic kidney disease, stage III (moderate) (HCC)    Check CMP Avoid nephrotoxic agents On ARB       Relevant Orders   CMP14+EGFR   BPH (benign prostatic hyperplasia) - Primary    On Tamsulosin  0.4 mg BID Referred to Urology       Relevant Orders   Ambulatory referral to Urology   UTI (urinary tract infection)    Recent episode, treated with Doxycycline Resolved now Referred to Urology for evaluation as patient has microscopic hematuria and h/o urethral stricture       Other Visit Diagnoses     Subclinical hypothyroidism       Relevant Orders   TSH + free T4       Meds ordered this encounter  Medications   losartan-hydrochlorothiazide (HYZAAR) 100-25 MG tablet    Sig: Take 1 tablet by mouth daily.     Dispense:  90 tablet    Refill:  0    Follow-up: Return if symptoms worsen or fail to improve.    [name], MD

## 2020-11-30 NOTE — Patient Instructions (Signed)
Please continue to take medications as prescribed.  Please continue to follow DASH diet and ambulate as tolerated.  You are being referred to Urology for evaluation of BPH and blood in urine.

## 2020-11-30 NOTE — Assessment & Plan Note (Signed)
Check CMP Avoid nephrotoxic agents On ARB

## 2020-11-30 NOTE — Assessment & Plan Note (Signed)
On Tamsulosin 0.4 mg BID Referred to Urology

## 2020-11-30 NOTE — Assessment & Plan Note (Signed)
BP Readings from Last 1 Encounters:  11/30/20 140/75   Overall well-controlled with Amlodipine, Losartan-HCTZ and Metoprolol Counseled for compliance with the medications Advised DASH diet and moderate exercise/walking, at least 150 mins/week

## 2020-11-30 NOTE — Assessment & Plan Note (Signed)
Recent episode, treated with Doxycycline Resolved now Referred to Urology for evaluation as patient has microscopic hematuria and h/o urethral stricture

## 2020-12-11 ENCOUNTER — Ambulatory Visit (INDEPENDENT_AMBULATORY_CARE_PROVIDER_SITE_OTHER): Payer: Medicare Other | Admitting: Internal Medicine

## 2020-12-11 ENCOUNTER — Encounter: Payer: Self-pay | Admitting: Internal Medicine

## 2020-12-11 ENCOUNTER — Other Ambulatory Visit: Payer: Self-pay

## 2020-12-11 VITALS — BP 134/84 | HR 66 | Temp 98.0°F | Resp 16 | Ht 69.0 in | Wt 229.1 lb

## 2020-12-11 DIAGNOSIS — R300 Dysuria: Secondary | ICD-10-CM | POA: Diagnosis not present

## 2020-12-11 DIAGNOSIS — N3000 Acute cystitis without hematuria: Secondary | ICD-10-CM | POA: Diagnosis not present

## 2020-12-11 LAB — POCT URINALYSIS DIPSTICK OB
Bilirubin, UA: NEGATIVE
Glucose, UA: NEGATIVE
Ketones, UA: NEGATIVE
Nitrite, UA: NEGATIVE
Spec Grav, UA: 1.015 (ref 1.010–1.025)
Urobilinogen, UA: 0.2 E.U./dL
pH, UA: 5.5 (ref 5.0–8.0)

## 2020-12-11 MED ORDER — SULFAMETHOXAZOLE-TRIMETHOPRIM 800-160 MG PO TABS: 1.0000 | ORAL_TABLET | Freq: Two times a day (BID) | ORAL | 0 refills | Status: DC

## 2020-12-11 NOTE — Progress Notes (Signed)
Acute Office Visit  Subjective:    Patient ID: Jonathan [name], male    DOB: Jun 28, 1945, 75 y.o.   MRN: [id number]  Chief Complaint  Patient presents with   Dysuria    Pt having painful urination before and after he urinates this has been going no for about 1.5 months     HPI Patient is in today for evaluation of dysuria for last 1 month, which has been intermittent. Denies any hematuria. Denies any fever or chills. He was referred to Urology for evaluation of BPH and hematuria, but has not been able to see them yet.  Past Medical History:  Diagnosis Date   Arthritis    ra, sees dr Sedonia Small   Coronary artery disease    saw dr zachery in [place] 20 yrs ago, released from cardiology   Diabetes mellitus without complication ([place])    type 2   Hypertension    Urethral stricture     Past Surgical History:  Procedure Laterality Date   BACK SURGERY     x 3 lower back   CARDIAC CATHETERIZATION  2000   small amt plaque relased from cardiology 20 yrs ago   [place] N/A 04/17/2019   Procedure: CYSTOSCOPY WITH URETHRAL DILATATION, CYSTOGRAM;  Surgeon: Ceasar Mons, MD;  Location: [facility];  Service: Urology;  Laterality: N/A;   EYE SURGERY Bilateral    cataracts   urethral stricture surgery  15 yrs ago    History reviewed. No pertinent family history.  Social History   Socioeconomic History   Marital status: Widowed    Spouse name: Not on file   Number of children: Not on file   Years of education: Not on file   Highest education level: Not on file  Occupational History   Not on file  Tobacco Use   Smoking status: Never   Smokeless tobacco: Never  Vaping Use   Vaping Use: Never used  Substance and Sexual Activity   Alcohol use: No   Drug use: No   Sexual activity: Not on file  Other Topics Concern   Not on file  Social History Narrative   Not on file   Social Determinants of  Health   Financial Resource Strain: Low Risk    Difficulty of Paying Living Expenses: Not hard at all  Food Insecurity: No Food Insecurity   Worried About Running Out of Food in the Last Year: Never true   [place] in the Last Year: Never true  Transportation Needs: No Transportation Needs   Lack of Transportation (Medical): No   Lack of Transportation (Non-Medical): No  Physical Activity: Insufficiently Active   Days of Exercise per Week: 3 days   Minutes of Exercise per Session: 30 min  Stress: No Stress Concern Present   Feeling of Stress : Not at all  Social Connections: Moderately Integrated   Frequency of Communication with Friends and Family: More than three times a week   Frequency of Social Gatherings with Friends and Family: More than three times a week   Attends Religious Services: More than 4 times per year   Active Member of [employer] or Organizations: No   Attends Archivist Meetings: Never   Marital Status: Married  Human resources officer Violence: Not At Risk   Fear of Current or Ex-Partner: No   Emotionally Abused: No   Physically Abused: No   Sexually Abused: No  Outpatient Medications Prior to Visit  Medication Sig Dispense Refill   amLODipine (NORVASC) 10 MG tablet Take 1 tablet (10 mg total) by mouth daily. 30 tablet 0   aspirin EC 81 MG tablet Take 81 mg by mouth daily. Swallow whole.     Cyanocobalamin 2500 MCG CHEW Chew by mouth daily.     folic acid (FOLVITE) 1 MG tablet Take 1 mg by mouth daily.     glipiZIDE (GLUCOTROL) 5 MG tablet Take 1 tablet (5 mg total) by mouth 2 (two) times daily before a meal. 30 tablet 0   losartan-hydrochlorothiazide (HYZAAR) 100-25 MG tablet Take 1 tablet by mouth daily. 90 tablet 0   methocarbamol (ROBAXIN) 500 MG tablet Take 1 tablet (500 mg total) by mouth every 6 (six) hours as needed for muscle spasms. 30 tablet 0   methotrexate (RHEUMATREX) 2.5 MG tablet Caution:Chemotherapy. Protect from light. Take 5 tablets  PO once weekly.     metoprolol succinate (TOPROL-XL) 50 MG 24 hr tablet Take 50 mg by mouth 2 (two) times daily. Take with or immediately following a meal.     omeprazole (PRILOSEC) 20 MG capsule Take 1 capsule (20 mg total) by mouth daily. 30 capsule 0   predniSONE (DELTASONE) 2.5 MG tablet Take 2.5 mg by mouth daily with breakfast.     tamsulosin (FLOMAX) 0.4 MG CAPS capsule Take 1 capsule (0.4 mg total) by mouth daily after supper. 30 capsule 5   triamcinolone cream (KENALOG) 0.1 % Apply 1 application topically 2 (two) times daily. 30 g 0   No facility-administered medications prior to visit.    Allergies  Allergen Reactions   Ciprofloxacin    Oxycodone-Acetaminophen Rash    Review of Systems  Constitutional:  Negative for chills and fever.  HENT:  Negative for congestion and sore throat.   Eyes:  Negative for pain and discharge.  Respiratory:  Negative for cough and shortness of breath.   Cardiovascular:  Negative for chest pain and palpitations.  Gastrointestinal:  Negative for constipation, diarrhea, nausea and vomiting.  Endocrine: Negative for polydipsia and polyuria.  Genitourinary:  Positive for dysuria. Negative for hematuria.  Musculoskeletal:  Positive for arthralgias and back pain. Negative for neck pain and neck stiffness.  Skin:  Negative for rash.  Neurological:  Negative for dizziness, weakness, numbness and headaches.  Psychiatric/Behavioral:  Negative for agitation and behavioral problems.       Objective:    Physical Exam Vitals reviewed.  Constitutional:      General: He is not in acute distress.    Appearance: He is obese. He is not diaphoretic.  HENT:     Head: Normocephalic and atraumatic.     Nose: Nose normal.     Mouth/Throat:     Mouth: Mucous membranes are moist.  Eyes:     General: No scleral icterus.    Extraocular Movements: Extraocular movements intact.  Cardiovascular:     Rate and Rhythm: Normal rate and regular rhythm.     Pulses:  Normal pulses.     Heart sounds: Normal heart sounds. No murmur heard. Pulmonary:     Breath sounds: Normal breath sounds. No wheezing or rales.  Abdominal:     Palpations: Abdomen is soft.     Tenderness: There is no abdominal tenderness. There is no right CVA tenderness or left CVA tenderness.  Musculoskeletal:     Cervical back: Neck supple. No tenderness.     Right lower leg: No edema.     Left  lower leg: No edema.  Skin:    General: Skin is warm.     Findings: No rash.  Neurological:     General: No focal deficit present.     Mental Status: He is alert and oriented to person, place, and time.  Psychiatric:        Mood and Affect: Mood normal.        Behavior: Behavior normal.    BP 134/84 (BP Location: Right Arm, Patient Position: Sitting, Cuff Size: Normal)   Pulse 66   Temp 98 F (36.7 C) (Oral)   Resp 16   Ht _0  (1.753 m)   Wt 229 lb 1.9 oz (103.9 kg)   SpO2 94%   BMI 33.84 kg/m  Wt Readings from Last 3 Encounters:  12/11/20 229 lb 1.9 oz (103.9 kg)  11/30/20 231 lb 12.8 oz (105.1 kg)  10/26/20 231 lb (104.8 kg)    Health Maintenance Due  Topic Date Due   OPHTHALMOLOGY EXAM  Never done   TETANUS/TDAP  Never done   COLONOSCOPY (Pts 45-26yr Insurance coverage will need to be confirmed)  Never done   Zoster Vaccines- Shingrix (1 of 2) Never done   PNA vac Low Risk Adult (1 of 2 - PCV13) Never done   COVID-19 Vaccine (3 - Booster for Pfizer series) 12/31/2019   INFLUENZA VACCINE  11/30/2020    There are no preventive care reminders to display for this patient.   Lab Results  Component Value Date   TSH 5.230 (H) 10/23/2020   Lab Results  Component Value Date   WBC 5.8 10/23/2020   HGB 13.6 10/23/2020   HCT 39.5 10/23/2020   MCV 87 10/23/2020   PLT 152 10/23/2020   Lab Results  Component Value Date   NA 139 10/23/2020   K 4.3 10/23/2020   CO2 22 10/23/2020   GLUCOSE 145 (H) 10/23/2020   BUN 17 10/23/2020   CREATININE 1.36 (H) 10/23/2020    BILITOT 0.6 10/23/2020   ALKPHOS 91 10/23/2020   AST 54 (H) 10/23/2020   ALT 55 (H) 10/23/2020   PROT 7.6 10/23/2020   ALBUMIN 4.1 10/23/2020   CALCIUM 9.3 10/23/2020   EGFR 55 (L) 10/23/2020   Lab Results  Component Value Date   CHOL 217 (H) 10/23/2020   Lab Results  Component Value Date   HDL 53 10/23/2020   Lab Results  Component Value Date   LDLCALC 139 (H) 10/23/2020   Lab Results  Component Value Date   TRIG 140 10/23/2020   Lab Results  Component Value Date   CHOLHDL 4.1 10/23/2020   Lab Results  Component Value Date   HGBA1C 7.7 (H) 10/23/2020       Assessment & Plan:   Problem List Items Addressed This Visit       Genitourinary   UTI (urinary tract infection) - Primary UA reviewed - moderate LE with blood Started Bactrim Check [place]orearenal to r/o nephrolithiasis Referred to Urology as patient has h/o BPH and urethral stricture   Relevant Medications   sulfamethoxazole-trimethoprim (BACTRIM DS) 800-160 MG tablet   Other Relevant Orders   [place]oreaRenal   Other Visit Diagnoses     Dysuria       Relevant Orders   POC Urinalysis Dipstick OB (Completed)        Meds ordered this encounter  Medications   sulfamethoxazole-trimethoprim (BACTRIM DS) 800-160 MG tablet    Sig: Take 1 tablet by mouth 2 (two) times daily.  Dispense:  10 tablet    Refill:  0     Cristle Jared Keith Rake, MD

## 2020-12-11 NOTE — Patient Instructions (Signed)
Please start taking Bactrim as prescribed.  You are being scheduled to get [place] of kidney.

## 2020-12-11 NOTE — Addendum Note (Signed)
Addended by: Zacarias Pontes R on: 12/11/2020 09:33 AM   Modules accepted: Orders

## 2020-12-15 LAB — URINE CULTURE

## 2020-12-17 ENCOUNTER — Ambulatory Visit (HOSPITAL_COMMUNITY)
Admission: RE | Admit: 2020-12-17 | Discharge: 2020-12-17 | Disposition: A | Discharge status: Home / Self Care | Payer: Medicare Other | Source: Ambulatory Visit | Attending: Internal Medicine | Admitting: Internal Medicine

## 2020-12-17 ENCOUNTER — Other Ambulatory Visit: Payer: Self-pay

## 2020-12-17 DIAGNOSIS — N3000 Acute cystitis without hematuria: Secondary | ICD-10-CM | POA: Insufficient documentation

## 2020-12-17 DIAGNOSIS — K760 Fatty (change of) liver, not elsewhere classified: Secondary | ICD-10-CM | POA: Diagnosis not present

## 2020-12-17 DIAGNOSIS — Q6101 Congenital single renal cyst: Secondary | ICD-10-CM | POA: Diagnosis not present

## 2020-12-17 DIAGNOSIS — R143 Flatulence: Secondary | ICD-10-CM | POA: Diagnosis not present

## 2020-12-22 ENCOUNTER — Ambulatory Visit (INDEPENDENT_AMBULATORY_CARE_PROVIDER_SITE_OTHER): Payer: Medicare Other | Admitting: Internal Medicine

## 2020-12-22 ENCOUNTER — Encounter: Payer: Self-pay | Admitting: Internal Medicine

## 2020-12-22 ENCOUNTER — Other Ambulatory Visit: Payer: Self-pay

## 2020-12-22 VITALS — BP 122/73 | HR 76 | Temp 98.7°F | Resp 20 | Ht 69.0 in | Wt 233.0 lb

## 2020-12-22 DIAGNOSIS — K649 Unspecified hemorrhoids: Secondary | ICD-10-CM

## 2020-12-22 DIAGNOSIS — I10 Essential (primary) hypertension: Secondary | ICD-10-CM

## 2020-12-22 DIAGNOSIS — K648 Other hemorrhoids: Secondary | ICD-10-CM | POA: Insufficient documentation

## 2020-12-22 MED ORDER — HYDROCORTISONE ACETATE 25 MG RE SUPP: 25.0000 mg | Freq: Two times a day (BID) | RECTAL | 0 refills | Status: DC | PRN

## 2020-12-22 NOTE — Assessment & Plan Note (Signed)
BP Readings from Last 1 Encounters:  12/22/20 122/73   Overall well-controlled with Amlodipine, Losartan-HCTZ and Metoprolol Counseled for compliance with the medications Advised DASH diet and moderate exercise/walking, at least 150 mins/week

## 2020-12-22 NOTE — Progress Notes (Signed)
Acute Office Visit  Subjective:    Patient ID: Jonathan [name], male    DOB: 10-Nov-1945, 75 y.o.   MRN: [id number]  Chief Complaint  Patient presents with   Hemorrhoids    Rectal pain x 2 months with bowel movements, intermittent constipation. Symptoms are worsening.     HPI Patient is in today for evaluation of rectal pain which is present for a long time, but worse since last 2 months. He has tried Preparation H with some relief. Denies anyrectal bleeding currently, but pain is worse while bending and passing stool. He reports having constipation alternating with watery BM at times. He takes Linzess as needed for constipation.  Past Medical History:  Diagnosis Date   Arthritis    ra, sees dr Sedonia Small   Coronary artery disease    saw dr zachery in [place] 20 yrs ago, released from cardiology   Diabetes mellitus without complication ([place])    type 2   Hypertension    Urethral stricture     Past Surgical History:  Procedure Laterality Date   BACK SURGERY     x 3 lower back   CARDIAC CATHETERIZATION  2000   small amt plaque relased from cardiology 20 yrs ago   [place] N/A 04/17/2019   Procedure: CYSTOSCOPY WITH URETHRAL DILATATION, CYSTOGRAM;  Surgeon: Ceasar Mons, MD;  Location: [facility];  Service: Urology;  Laterality: N/A;   EYE SURGERY Bilateral    cataracts   urethral stricture surgery  15 yrs ago    History reviewed. No pertinent family history.  Social History   Socioeconomic History   Marital status: Widowed    Spouse name: Not on file   Number of children: Not on file   Years of education: Not on file   Highest education level: Not on file  Occupational History   Not on file  Tobacco Use   Smoking status: Never   Smokeless tobacco: Never  Vaping Use   Vaping Use: Never used  Substance and Sexual Activity   Alcohol use: No   Drug use: No   Sexual activity: Not on  file  Other Topics Concern   Not on file  Social History Narrative   Not on file   Social Determinants of Health   Financial Resource Strain: Low Risk    Difficulty of Paying Living Expenses: Not hard at all  Food Insecurity: No Food Insecurity   Worried About Running Out of Food in the Last Year: Never true   [place] in the Last Year: Never true  Transportation Needs: No Transportation Needs   Lack of Transportation (Medical): No   Lack of Transportation (Non-Medical): No  Physical Activity: Insufficiently Active   Days of Exercise per Week: 3 days   Minutes of Exercise per Session: 30 min  Stress: No Stress Concern Present   Feeling of Stress : Not at all  Social Connections: Moderately Integrated   Frequency of Communication with Friends and Family: More than three times a week   Frequency of Social Gatherings with Friends and Family: More than three times a week   Attends Religious Services: More than 4 times per year   Active Member of [employer] or Organizations: No   Attends Archivist Meetings: Never   Marital Status: Married  Human resources officer Violence: Not At Risk   Fear of Current or Ex-Partner: No   Emotionally Abused: No  Physically Abused: No   Sexually Abused: No    Outpatient Medications Prior to Visit  Medication Sig Dispense Refill   amLODipine (NORVASC) 10 MG tablet Take 1 tablet (10 mg total) by mouth daily. 30 tablet 0   aspirin EC 81 MG tablet Take 81 mg by mouth daily. Swallow whole.     Cyanocobalamin 2500 MCG CHEW Chew by mouth daily.     folic acid (FOLVITE) 1 MG tablet Take 1 mg by mouth daily.     glipiZIDE (GLUCOTROL) 5 MG tablet Take 1 tablet (5 mg total) by mouth 2 (two) times daily before a meal. 30 tablet 0   losartan-hydrochlorothiazide (HYZAAR) 100-25 MG tablet Take 1 tablet by mouth daily. 90 tablet 0   methocarbamol (ROBAXIN) 500 MG tablet Take 1 tablet (500 mg total) by mouth every 6 (six) hours as needed for muscle spasms.  30 tablet 0   methotrexate (RHEUMATREX) 2.5 MG tablet Caution:Chemotherapy. Protect from light. Take 5 tablets PO once weekly.     metoprolol succinate (TOPROL-XL) 50 MG 24 hr tablet Take 50 mg by mouth 2 (two) times daily. Take with or immediately following a meal.     omeprazole (PRILOSEC) 20 MG capsule Take 1 capsule (20 mg total) by mouth daily. 30 capsule 0   predniSONE (DELTASONE) 2.5 MG tablet Take 2.5 mg by mouth daily with breakfast.     tamsulosin (FLOMAX) 0.4 MG CAPS capsule Take 1 capsule (0.4 mg total) by mouth daily after supper. 30 capsule 5   triamcinolone cream (KENALOG) 0.1 % Apply 1 application topically 2 (two) times daily. 30 g 0   sulfamethoxazole-trimethoprim (BACTRIM DS) 800-160 MG tablet Take 1 tablet by mouth 2 (two) times daily. 10 tablet 0   No facility-administered medications prior to visit.    Allergies  Allergen Reactions   Ciprofloxacin    Oxycodone-Acetaminophen Rash    Review of Systems  Cardiovascular:  Negative for chest pain and palpitations.  Gastrointestinal:  Positive for constipation and rectal pain. Negative for abdominal pain and blood in stool.  Genitourinary:  Negative for dysuria and hematuria.  Musculoskeletal:  Negative for neck pain and neck stiffness.  Skin:  Negative for rash.      Objective:    Physical Exam Vitals reviewed.  Constitutional:      General: He is not in acute distress.    Appearance: He is obese. He is not diaphoretic.  HENT:     Head: Normocephalic and atraumatic.     Nose: Nose normal.     Mouth/Throat:     Mouth: Mucous membranes are moist.  Eyes:     General: No scleral icterus.    Extraocular Movements: Extraocular movements intact.  Cardiovascular:     Rate and Rhythm: Normal rate and regular rhythm.     Pulses: Normal pulses.     Heart sounds: Normal heart sounds. No murmur heard. Pulmonary:     Breath sounds: Normal breath sounds. No wheezing or rales.  Abdominal:     Palpations: Abdomen is  soft.     Tenderness: There is no abdominal tenderness. There is no right CVA tenderness or left CVA tenderness.  Genitourinary:    Comments: External hemorrhoids noted, no bleeding Yellowish fecal material around anal sphincter Musculoskeletal:     Cervical back: Neck supple. No tenderness.     Right lower leg: No edema.     Left lower leg: No edema.  Skin:    General: Skin is warm.  Findings: No rash.  Neurological:     General: No focal deficit present.     Mental Status: He is alert and oriented to person, place, and time.  Psychiatric:        Mood and Affect: Mood normal.        Behavior: Behavior normal.    BP 122/73 (BP Location: Right Arm, Patient Position: Sitting, Cuff Size: Large)   Pulse 76   Temp 98.7 F (37.1 C)   Resp 20   Ht 5' 9"  (1.753 m)   Wt 233 lb (105.7 kg)   SpO2 94%   BMI 34.41 kg/m  Wt Readings from Last 3 Encounters:  12/22/20 233 lb (105.7 kg)  12/11/20 229 lb 1.9 oz (103.9 kg)  11/30/20 231 lb 12.8 oz (105.1 kg)    Health Maintenance Due  Topic Date Due   OPHTHALMOLOGY EXAM  Never done   TETANUS/TDAP  Never done   COLONOSCOPY (Pts 45-30yr Insurance coverage will need to be confirmed)  Never done   Zoster Vaccines- Shingrix (1 of 2) Never done   PNA vac Low Risk Adult (1 of 2 - PCV13) Never done   COVID-19 Vaccine (3 - Booster for Pfizer series) 12/31/2019   INFLUENZA VACCINE  11/30/2020    There are no preventive care reminders to display for this patient.   Lab Results  Component Value Date   TSH 5.230 (H) 10/23/2020   Lab Results  Component Value Date   WBC 5.8 10/23/2020   HGB 13.6 10/23/2020   HCT 39.5 10/23/2020   MCV 87 10/23/2020   PLT 152 10/23/2020   Lab Results  Component Value Date   NA 139 10/23/2020   K 4.3 10/23/2020   CO2 22 10/23/2020   GLUCOSE 145 (H) 10/23/2020   BUN 17 10/23/2020   CREATININE 1.36 (H) 10/23/2020   BILITOT 0.6 10/23/2020   ALKPHOS 91 10/23/2020   AST 54 (H) 10/23/2020   ALT 55  (H) 10/23/2020   PROT 7.6 10/23/2020   ALBUMIN 4.1 10/23/2020   CALCIUM 9.3 10/23/2020   EGFR 55 (L) 10/23/2020   Lab Results  Component Value Date   CHOL 217 (H) 10/23/2020   Lab Results  Component Value Date   HDL 53 10/23/2020   Lab Results  Component Value Date   LDLCALC 139 (H) 10/23/2020   Lab Results  Component Value Date   TRIG 140 10/23/2020   Lab Results  Component Value Date   CHOLHDL 4.1 10/23/2020   Lab Results  Component Value Date   HGBA1C 7.7 (H) 10/23/2020       Assessment & Plan:   Problem List Items Addressed This Visit       Cardiovascular and Mediastinum   Essential hypertension    BP Readings from Last 1 Encounters:  12/22/20 122/73  Overall well-controlled with Amlodipine, Losartan-HCTZ and Metoprolol Counseled for compliance with the medications Advised DASH diet and moderate exercise/walking, at least 150 mins/week      Hemorrhoids - Primary    External hemorrhoids - Anusol suppository PRN Preparation H cream Maintain adequate hydration to avoid constipation Advised to take Linzess for constipation which he already has. Can take Senna or Colace PRN for constipation      Relevant Medications   hydrocortisone (ANUSOL-HC) 25 MG suppository     Meds ordered this encounter  Medications   hydrocortisone (ANUSOL-HC) 25 MG suppository    Sig: Place 1 suppository (25 mg total) rectally 2 (two) times daily as needed for  hemorrhoids or anal itching.    Dispense:  24 suppository    Refill:  0     Michael Ventresca Keith Rake, MD

## 2020-12-22 NOTE — Assessment & Plan Note (Signed)
External hemorrhoids - Anusol suppository PRN Preparation H cream Maintain adequate hydration to avoid constipation Advised to take Linzess for constipation which he already has. Can take Senna or Colace PRN for constipation

## 2020-12-24 ENCOUNTER — Telehealth: Payer: Self-pay

## 2020-12-24 NOTE — Telephone Encounter (Signed)
Pt informed

## 2020-12-24 NOTE — Telephone Encounter (Signed)
Patient call can not afford Hydrocortisone cost $200.00 can something else be called into his pharmacy.   [place]

## 2020-12-28 ENCOUNTER — Ambulatory Visit
Admission: EM | Admit: 2020-12-28 | Discharge: 2020-12-28 | Disposition: A | Discharge status: Home / Self Care | Payer: Medicare Other | Attending: Family Medicine | Admitting: Family Medicine

## 2020-12-28 ENCOUNTER — Other Ambulatory Visit: Payer: Self-pay

## 2020-12-28 ENCOUNTER — Encounter: Payer: Self-pay | Admitting: Emergency Medicine

## 2020-12-28 ENCOUNTER — Telehealth: Payer: Medicare Other | Admitting: Nurse Practitioner

## 2020-12-28 DIAGNOSIS — E1122 Type 2 diabetes mellitus with diabetic chronic kidney disease: Secondary | ICD-10-CM | POA: Diagnosis not present

## 2020-12-28 DIAGNOSIS — K529 Noninfective gastroenteritis and colitis, unspecified: Secondary | ICD-10-CM

## 2020-12-28 DIAGNOSIS — I9589 Other hypotension: Secondary | ICD-10-CM

## 2020-12-28 DIAGNOSIS — Z7984 Long term (current) use of oral hypoglycemic drugs: Secondary | ICD-10-CM

## 2020-12-28 DIAGNOSIS — E11649 Type 2 diabetes mellitus with hypoglycemia without coma: Secondary | ICD-10-CM | POA: Diagnosis not present

## 2020-12-28 DIAGNOSIS — N1831 Chronic kidney disease, stage 3a: Secondary | ICD-10-CM

## 2020-12-28 DIAGNOSIS — E861 Hypovolemia: Secondary | ICD-10-CM

## 2020-12-28 LAB — POCT URINALYSIS DIP (MANUAL ENTRY)
Blood, UA: NEGATIVE
Glucose, UA: NEGATIVE mg/dL
Ketones, POC UA: NEGATIVE mg/dL
Leukocytes, UA: NEGATIVE
Nitrite, UA: NEGATIVE
Protein Ur, POC: 30 mg/dL — AB
Spec Grav, UA: 1.025 (ref 1.010–1.025)
Urobilinogen, UA: 0.2 E.U./dL
pH, UA: 5.5 (ref 5.0–8.0)

## 2020-12-28 LAB — POCT FASTING CBG KUC MANUAL ENTRY: POCT Glucose (KUC): 47 mg/dL — AB (ref 70–99)

## 2020-12-28 MED ORDER — FAMOTIDINE 20 MG PO TABS: 20.0000 mg | ORAL_TABLET | Freq: Two times a day (BID) | ORAL | 0 refills | Status: DC

## 2020-12-28 MED ORDER — GLUCAGON HCL RDNA (DIAGNOSTIC) 1 MG IJ SOLR: 1.0000 mg | Freq: Once | INTRAMUSCULAR | Status: DC

## 2020-12-28 MED ORDER — SODIUM CHLORIDE 0.9 % IV SOLN: Freq: Once | INTRAVENOUS | Status: AC

## 2020-12-28 MED ORDER — GLUCOSE 4 G PO CHEW
1.0000 | CHEWABLE_TABLET | Freq: Once | ORAL | Status: AC
  Administered 2020-12-28: 4 g via ORAL

## 2020-12-28 MED ORDER — DICYCLOMINE HCL 20 MG PO TABS: 20.0000 mg | ORAL_TABLET | Freq: Two times a day (BID) | ORAL | 0 refills | Status: DC | PRN

## 2020-12-28 MED ORDER — GLUCAGON HCL RDNA (DIAGNOSTIC) 1 MG IJ SOLR
1.0000 mg | Freq: Once | INTRAMUSCULAR | Status: AC
  Administered 2020-12-28: 1 mg via INTRAVENOUS

## 2020-12-28 MED ORDER — ONDANSETRON HCL 4 MG PO TABS: 4.0000 mg | ORAL_TABLET | Freq: Four times a day (QID) | ORAL | 0 refills | Status: DC

## 2020-12-28 NOTE — ED Provider Notes (Signed)
[facility]    CSN: [id number] Arrival date & time: 12/28/20  1310      History   Chief Complaint Chief Complaint  Patient presents with   Diarrhea   Abdominal Pain    HPI Jonathan [name] is a 75 y.o. male.   HPI Patient is here with lower abdominal pain, watery diarrhea, loss of appetite x3 to 4 days. He denies any known sick contacts.  He endorses dizziness with ambulation.  On recheck of blood pressure patient is mildly hypotensive and his blood pressure is softer compared to previous blood pressure readings are of recent doctor's appointment.  He reports really not eating over the last 2 days.  He had a small amount of chicken noodle soup prior to arriving here and has not had much fluid and has not urinated but once today. He was recently diagnosed with a urinary tract infection but reports he never started the antibiotic medication as he has a sulfa allergy.  He reports not being prescribed any replacement antibiotics for treatment of the UTI.  He denies any burning or flank pain.  He is afebrile.  Past Medical History:  Diagnosis Date   Arthritis    ra, sees dr Sedonia Small   Coronary artery disease    saw dr zachery in [place] 20 yrs ago, released from cardiology   Diabetes mellitus without complication [facility])    type 2   Hypertension    Urethral stricture     Patient Active Problem List   Diagnosis Date Noted   Hemorrhoids 12/22/2020   UTI (urinary tract infection) 10/26/2020   Diabetes ([place]) 07/01/2008   Chronic kidney disease, stage III (moderate) ([place]) 07/16/2007   MONOCYTOSIS SYMPTOMATIC 04/23/2007   BPH (benign prostatic hyperplasia) 04/16/2007   Constipation 10/16/2006   Hyperlipidemia LDL goal <100 04/06/2006   Essential hypertension 04/06/2006   CARDIAC ARRHYTHMIA 04/06/2006   Allergic rhinitis 04/06/2006   GERD (gastroesophageal reflux disease) 04/06/2006   HIATAL HERNIA 04/06/2006   Rheumatoid arthritis ([place]) 04/06/2006   LOW BACK  PAIN 04/06/2006    Past Surgical History:  Procedure Laterality Date   BACK SURGERY     x 3 lower back   CARDIAC CATHETERIZATION  2000   small amt plaque relased from cardiology 20 yrs ago   [place] N/A 04/17/2019   Procedure: CYSTOSCOPY WITH URETHRAL DILATATION, CYSTOGRAM;  Surgeon: Ceasar Mons, MD;  Location: [facility];  Service: Urology;  Laterality: N/A;   EYE SURGERY Bilateral    cataracts   urethral stricture surgery  15 yrs ago       Home Medications    Prior to Admission medications   Medication Sig Start Date End Date Taking? Authorizing Provider  dicyclomine (BENTYL) 20 MG tablet Take 1 tablet (20 mg total) by mouth 2 (two) times daily between meals as needed for spasms. 12/28/20  Yes Scot Jun, FNP  famotidine (PEPCID) 20 MG tablet Take 1 tablet (20 mg total) by mouth 2 (two) times daily. 12/28/20  Yes Scot Jun, FNP  ondansetron (ZOFRAN) 4 MG tablet Take 1 tablet (4 mg total) by mouth every 6 (six) hours. 12/28/20  Yes Scot Jun, FNP  amLODipine (NORVASC) 10 MG tablet Take 1 tablet (10 mg total) by mouth daily. 11/03/20   [name], MD  aspirin EC 81 MG tablet Take 81 mg by mouth daily. Swallow whole.    [provider]  Cyanocobalamin 2500 MCG CHEW Chew by mouth daily.    [provider]  folic acid (FOLVITE) 1 MG tablet Take 1 mg by mouth daily.    [provider]  glipiZIDE (GLUCOTROL) 5 MG tablet Take 1 tablet (5 mg total) by mouth 2 (two) times daily before a meal. 11/03/20   Posey Pronto, Colin Broach, MD  hydrocortisone (ANUSOL-HC) 25 MG suppository Place 1 suppository (25 mg total) rectally 2 (two) times daily as needed for hemorrhoids or anal itching. 12/22/20   [name], MD  losartan-hydrochlorothiazide (HYZAAR) 100-25 MG tablet Take 1 tablet by mouth daily. 11/30/20   [name], MD  methocarbamol (ROBAXIN) 500 MG tablet Take 1  tablet (500 mg total) by mouth every 6 (six) hours as needed for muscle spasms. 10/20/20   [name], MD  methotrexate (RHEUMATREX) 2.5 MG tablet Caution:Chemotherapy. Protect from light. Take 5 tablets PO once weekly.    [provider]  metoprolol succinate (TOPROL-XL) 50 MG 24 hr tablet Take 50 mg by mouth 2 (two) times daily. Take with or immediately following a meal.    [provider]  omeprazole (PRILOSEC) 20 MG capsule Take 1 capsule (20 mg total) by mouth daily. 11/03/20   [name], MD  predniSONE (DELTASONE) 2.5 MG tablet Take 2.5 mg by mouth daily with breakfast.    [provider]  tamsulosin (FLOMAX) 0.4 MG CAPS capsule Take 1 capsule (0.4 mg total) by mouth daily after supper. 11/03/20   [name], MD  triamcinolone cream (KENALOG) 0.1 % Apply 1 application topically 2 (two) times daily. 10/22/20   [name], MD    Family History History reviewed. No pertinent family history.  Social History Social History   Tobacco Use   Smoking status: Never   Smokeless tobacco: Never  Vaping Use   Vaping Use: Never used  Substance Use Topics   Alcohol use: No   Drug use: No     Allergies   Ciprofloxacin and Oxycodone-acetaminophen  Review of Systems Review of Systems Pertinent negatives listed in HPI   Physical Exam Triage Vital Signs ED Triage Vitals  Enc Vitals Group     BP 12/28/20 1440 106/66     Pulse Rate 12/28/20 1440 79     Resp 12/28/20 1440 17     Temp 12/28/20 1440 98 F (36.7 C)     Temp src --      SpO2 12/28/20 1440 100 %     Weight --      Height --      Head Circumference --      Peak Flow --      Pain Score 12/28/20 1439 8     Pain Loc --      Pain Edu? --      Excl. in [place]? --    No data found.  Updated Vital Signs BP 114/77 (BP Location: Left Arm)   Pulse 79   Temp 98 F (36.7 C)   Resp 17   SpO2 100%   Visual Acuity Right Eye Distance:   Left Eye Distance:   Bilateral Distance:     Right Eye Near:   Left Eye Near:    Bilateral Near:     Physical Exam Constitutional:      Appearance: He is obese. He is ill-appearing. He is not toxic-appearing or diaphoretic.  HENT:     Head: Normocephalic and atraumatic.     Mouth/Throat:  Mouth: Mucous membranes are moist.  Cardiovascular:     Rate and Rhythm: Normal rate and regular rhythm.  Pulmonary:     Effort: Pulmonary effort is normal.     Breath sounds: Normal breath sounds. No wheezing or rhonchi.  Abdominal:     General: Abdomen is flat. Bowel sounds are increased. There is distension.     Palpations: Abdomen is soft.     Tenderness: There is abdominal tenderness in the right lower quadrant and left lower quadrant.  Skin:    General: Skin is warm.     Capillary Refill: Capillary refill takes less than 2 seconds.  Neurological:     General: No focal deficit present.     Mental Status: He is alert.  Psychiatric:        Mood and Affect: Mood normal.        Behavior: Behavior normal.     UC Treatments / Results  Labs (all labs ordered are listed, but only abnormal results are displayed)     EKG   Radiology No results found.  Procedures Procedures (including critical care time)  Medications Ordered in UC Medications  0.9 %  sodium chloride infusion ( Intravenous Stopped 12/28/20 1629)  glucose chewable tablet 4 g (4 g Oral Given 12/28/20 1602)  glucagon (human recombinant) (GLUCAGEN) injection 1 mg (1 mg Intravenous Given 12/28/20 1628)    Initial Impression / Assessment and Plan / UC Course  I have reviewed the triage vital signs and the nursing notes.  Pertinent labs & imaging results that were available during my care of the patient were reviewed by me and considered in my medical decision making (see chart for details).    Patient presents today with 3 days of abdominal pain which she describes mostly as cramping, persistent watery diarrhea, poor appetite and dizziness Patient's blood  sugar on arrival was 47 patient reports that he had continued to take his glipizide and other blood pressure medicines in spite of not eating or drinking very much fluids over the last 3 days.  Patient is obviously hypovolemic he was bolused with 250 of fluids and immediately reported improvement and fatigued.  Patient required treatment with glucose chewables along with glucagon to improve glucose reading from 47-90.  Patient was tolerating oral intake of both fluids and crackers prior to discharge.  He is here in clinic with family in depth discharge instructions provided to him and family advised him to hold glipizide dose until patient is able to tolerate a complete full meal.  Monitor glucose at home if blood sugar drops below 70 instructions given on how to increase blood sugar at home.  If blood sugar does not improve patient warrants immediate evaluation in the emergency department.  Given patient's history of CKD I am repeating a BMP to evaluate renal and electrolyte functioning.  Patient advised if labs are abnormal we will contact him at home.  Recheck of UA benign for any concerns of infection.  Encourage hydration with water at home.  Strict ER precautions given.  Family and patient verbalized understanding and agreement with today's plan.  A total of 180 minutes spent, greater than 50 % of this time was spent time providing direct patient care, reassessment, discussing treatment plan with patient and family, counseling, and educated changes current conditions which warrant a higher level of care. Final Clinical Impressions(s) / UC Diagnoses   Final diagnoses:  Hypotension due to hypovolemia  Stage 3a chronic kidney disease (HCC)  Gastroenteritis, acute  Hypoglycemia associated with diabetes [facility])     Discharge Instructions      If your abdominal pain does not readily resolve within the next 24 to 48 hours I would recommend going to the emergency room for further work-up and evaluation  of your symptoms. If the pain at any point becomes severe go immediately to the nearest emergency department.  Try and hydrate well with fluids.  I have prescribed you Zofran for nausea.  I have also prescribed you dicyclomine for abdominal pain and cramping.  Famotidine 20 mg to help decrease the acidity in your stomach.  Purchase over-the-counter Imodium A-D and take 1 tablet as needed for diarrhea.  Until your appetite improves I would like for you to monitor your blood sugar at home and hold your glipizide until you are able to eat a full and complete meal otherwise your blood sugar will become critically low and this can become life-threatening.  As soon as you are able to tolerate a full meal resume taking your glipizide     ED Prescriptions     Medication Sig Dispense Auth. Provider   famotidine (PEPCID) 20 MG tablet Take 1 tablet (20 mg total) by mouth 2 (two) times daily. 30 tablet Scot Jun, FNP   dicyclomine (BENTYL) 20 MG tablet Take 1 tablet (20 mg total) by mouth 2 (two) times daily between meals as needed for spasms. 12 tablet Scot Jun, FNP   ondansetron (ZOFRAN) 4 MG tablet Take 1 tablet (4 mg total) by mouth every 6 (six) hours. 8 tablet Scot Jun, FNP      PDMP not reviewed this encounter.   Scot Jun, FNP 12/29/20 1104

## 2020-12-28 NOTE — Discharge Instructions (Addendum)
If your abdominal pain does not readily resolve within the next 24 to 48 hours I would recommend going to the emergency room for further work-up and evaluation of your symptoms. If the pain at any point becomes severe go immediately to the nearest emergency department.  Try and hydrate well with fluids.  I have prescribed you Zofran for nausea.  I have also prescribed you dicyclomine for abdominal pain and cramping.  Famotidine 20 mg to help decrease the acidity in your stomach.  Purchase over-the-counter Imodium A-D and take 1 tablet as needed for diarrhea.  Until your appetite improves I would like for you to monitor your blood sugar at home and hold your glipizide until you are able to eat a full and complete meal otherwise your blood sugar will become critically low and this can become life-threatening.  As soon as you are able to tolerate a full meal resume taking your glipizide

## 2020-12-28 NOTE — ED Triage Notes (Signed)
Pt is present today with diarrhea, lower abdominal pain, nausea/vomiting that started Friday. Pt states that he is having at least 3-4 episodes of diarrhea daily.

## 2020-12-28 NOTE — ED Notes (Signed)
Pt reports he has taken glipizide and his 3 bp pills today

## 2020-12-29 LAB — BASIC METABOLIC PANEL
BUN/Creatinine Ratio: 14 (ref 10–24)
BUN: 47 mg/dL — ABNORMAL HIGH (ref 8–27)
CO2: 16 mmol/L — ABNORMAL LOW (ref 20–29)
Calcium: 8.3 mg/dL — ABNORMAL LOW (ref 8.6–10.2)
Chloride: 94 mmol/L — ABNORMAL LOW (ref 96–106)
Creatinine, Ser: 3.43 mg/dL — ABNORMAL HIGH (ref 0.76–1.27)
Glucose: 51 mg/dL — ABNORMAL LOW (ref 65–99)
Potassium: 3.9 mmol/L (ref 3.5–5.2)
Sodium: 129 mmol/L — ABNORMAL LOW (ref 134–144)
eGFR: 18 mL/min/{1.73_m2} — ABNORMAL LOW (ref >59)

## 2020-12-29 LAB — CBC
Hematocrit: 40.8 % (ref 37.5–51.0)
Hemoglobin: 13.5 g/dL (ref 13.0–17.7)
MCH: 29 pg (ref 26.6–33.0)
MCHC: 33.1 g/dL (ref 31.5–35.7)
MCV: 88 fL (ref 79–97)
Platelets: 208 10*3/uL (ref 150–450)
RBC: 4.66 x10E6/uL (ref 4.14–5.80)
RDW: 15.7 % — ABNORMAL HIGH (ref 11.6–15.4)
WBC: 8.1 10*3/uL (ref 3.4–10.8)

## 2020-12-31 ENCOUNTER — Ambulatory Visit (INDEPENDENT_AMBULATORY_CARE_PROVIDER_SITE_OTHER): Payer: Medicare Other | Admitting: Internal Medicine

## 2020-12-31 ENCOUNTER — Encounter: Payer: Self-pay | Admitting: Internal Medicine

## 2020-12-31 ENCOUNTER — Other Ambulatory Visit: Payer: Self-pay

## 2020-12-31 VITALS — BP 96/62 | HR 60 | Temp 98.2°F | Resp 18 | Ht 69.0 in | Wt 226.0 lb

## 2020-12-31 DIAGNOSIS — E871 Hypo-osmolality and hyponatremia: Secondary | ICD-10-CM

## 2020-12-31 DIAGNOSIS — E162 Hypoglycemia, unspecified: Secondary | ICD-10-CM

## 2020-12-31 DIAGNOSIS — N179 Acute kidney failure, unspecified: Secondary | ICD-10-CM | POA: Diagnosis not present

## 2020-12-31 DIAGNOSIS — K529 Noninfective gastroenteritis and colitis, unspecified: Secondary | ICD-10-CM | POA: Diagnosis not present

## 2020-12-31 NOTE — Progress Notes (Signed)
Acute Office Visit  Subjective:    Patient ID: Jonathan [name], male    DOB: [birth year], 75 y.o.   MRN: [id number]  Chief Complaint  Patient presents with   Dehydration    Pt was having diarrhea on Friday on Monday had to go to urgent care he was dehydrated had low blood sugar and low bp was given fluids and meds does feel better today    HPI Patient is in today for follow up after recent Urgent care visit for diarrhea. He states that he had watery diarrhea recently, which have improved now. He has had 2 BMs today, but denies any watery diarrhea. He had hypotension and hypoglycemia in Urgent care. He admits having poor PO intake of fluids. Denies any nausea or vomiting. Denies any fever, chills or abdominal pain currently.  He has elevation of his S. Cr. as well. He denies any dysuria, hematuria or urinary hesitance.  Past Medical History:  Diagnosis Date   Arthritis    ra, sees dr Sedonia Small   Coronary artery disease    saw dr zachery in [place] 20 yrs ago, released from cardiology   Diabetes mellitus without complication ([place])    type 2   Hypertension    Urethral stricture     Past Surgical History:  Procedure Laterality Date   BACK SURGERY     x 3 lower back   CARDIAC CATHETERIZATION  2000   small amt plaque relased from cardiology 20 yrs ago   [place] N/A 04/17/2019   Procedure: CYSTOSCOPY WITH URETHRAL DILATATION, CYSTOGRAM;  Surgeon: Ceasar Mons, MD;  Location: [facility];  Service: Urology;  Laterality: N/A;   EYE SURGERY Bilateral    cataracts   urethral stricture surgery  15 yrs ago    History reviewed. No pertinent family history.  Social History   Socioeconomic History   Marital status: Widowed    Spouse name: Not on file   Number of children: Not on file   Years of education: Not on file   Highest education level: Not on file  Occupational History   Not on file   Tobacco Use   Smoking status: Never   Smokeless tobacco: Never  Vaping Use   Vaping Use: Never used  Substance and Sexual Activity   Alcohol use: No   Drug use: No   Sexual activity: Not on file  Other Topics Concern   Not on file  Social History Narrative   Not on file   Social Determinants of Health   Financial Resource Strain: Low Risk    Difficulty of Paying Living Expenses: Not hard at all  Food Insecurity: No Food Insecurity   Worried About Running Out of Food in the Last Year: Never true   [place] in the Last Year: Never true  Transportation Needs: No Transportation Needs   Lack of Transportation (Medical): No   Lack of Transportation (Non-Medical): No  Physical Activity: Insufficiently Active   Days of Exercise per Week: 3 days   Minutes of Exercise per Session: 30 min  Stress: No Stress Concern Present   Feeling of Stress : Not at all  Social Connections: Moderately Integrated   Frequency of Communication with Friends and Family: More than three times a week   Frequency of Social Gatherings with Friends and Family: More than three times a week   Attends Religious Services: More than 4 times per  year   Active Member of Clubs or Organizations: No   Attends Archivist Meetings: Never   Marital Status: Married  Human resources officer Violence: Not At Risk   Fear of Current or Ex-Partner: No   Emotionally Abused: No   Physically Abused: No   Sexually Abused: No    Outpatient Medications Prior to Visit  Medication Sig Dispense Refill   amLODipine (NORVASC) 10 MG tablet Take 1 tablet (10 mg total) by mouth daily. 30 tablet 0   aspirin EC 81 MG tablet Take 81 mg by mouth daily. Swallow whole.     Cyanocobalamin 2500 MCG CHEW Chew by mouth daily.     dicyclomine (BENTYL) 20 MG tablet Take 1 tablet (20 mg total) by mouth 2 (two) times daily between meals as needed for spasms. 12 tablet 0   famotidine (PEPCID) 20 MG tablet Take 1 tablet (20 mg total) by  mouth 2 (two) times daily. 30 tablet 0   folic acid (FOLVITE) 1 MG tablet Take 1 mg by mouth daily.     glipiZIDE (GLUCOTROL) 5 MG tablet Take 1 tablet (5 mg total) by mouth 2 (two) times daily before a meal. 30 tablet 0   hydrocortisone (ANUSOL-HC) 25 MG suppository Place 1 suppository (25 mg total) rectally 2 (two) times daily as needed for hemorrhoids or anal itching. 24 suppository 0   losartan-hydrochlorothiazide (HYZAAR) 100-25 MG tablet Take 1 tablet by mouth daily. 90 tablet 0   methocarbamol (ROBAXIN) 500 MG tablet Take 1 tablet (500 mg total) by mouth every 6 (six) hours as needed for muscle spasms. 30 tablet 0   methotrexate (RHEUMATREX) 2.5 MG tablet Caution:Chemotherapy. Protect from light. Take 5 tablets PO once weekly.     metoprolol succinate (TOPROL-XL) 50 MG 24 hr tablet Take 50 mg by mouth 2 (two) times daily. Take with or immediately following a meal.     omeprazole (PRILOSEC) 20 MG capsule Take 1 capsule (20 mg total) by mouth daily. 30 capsule 0   ondansetron (ZOFRAN) 4 MG tablet Take 1 tablet (4 mg total) by mouth every 6 (six) hours. 8 tablet 0   predniSONE (DELTASONE) 2.5 MG tablet Take 2.5 mg by mouth daily with breakfast.     tamsulosin (FLOMAX) 0.4 MG CAPS capsule Take 1 capsule (0.4 mg total) by mouth daily after supper. 30 capsule 5   triamcinolone cream (KENALOG) 0.1 % Apply 1 application topically 2 (two) times daily. 30 g 0   No facility-administered medications prior to visit.    Allergies  Allergen Reactions   Ciprofloxacin    Oxycodone-Acetaminophen Rash    Review of Systems  Cardiovascular:  Negative for chest pain and palpitations.  Gastrointestinal:  Positive for diarrhea and rectal pain. Negative for abdominal pain and blood in stool.  Genitourinary:  Negative for dysuria and hematuria.  Musculoskeletal:  Positive for arthralgias and back pain. Negative for neck pain and neck stiffness.  Skin:  Negative for rash.      Objective:    Physical  Exam Vitals reviewed.  Constitutional:      General: He is not in acute distress.    Appearance: He is obese. He is not diaphoretic.  HENT:     Head: Normocephalic and atraumatic.     Nose: Nose normal.     Mouth/Throat:     Mouth: Mucous membranes are moist.  Eyes:     General: No scleral icterus.    Extraocular Movements: Extraocular movements intact.  Cardiovascular:  Rate and Rhythm: Normal rate and regular rhythm.     Pulses: Normal pulses.     Heart sounds: Normal heart sounds. No murmur heard. Pulmonary:     Breath sounds: Normal breath sounds. No wheezing or rales.  Abdominal:     Palpations: Abdomen is soft.     Tenderness: There is no abdominal tenderness. There is no right CVA tenderness or left CVA tenderness.  Musculoskeletal:     Cervical back: Neck supple. No tenderness.     Right lower leg: No edema.     Left lower leg: No edema.  Skin:    General: Skin is warm.     Findings: No rash.  Neurological:     General: No focal deficit present.     Mental Status: He is alert and oriented to person, place, and time.  Psychiatric:        Mood and Affect: Mood normal.        Behavior: Behavior normal.    BP 96/62 (BP Location: Left Arm, Patient Position: Sitting, Cuff Size: Normal)   Pulse 60   Temp 98.2 F (36.8 C) (Oral)   Resp 18   Ht _0  (1.753 m)   Wt 226 lb 0.6 oz (102.5 kg)   SpO2 95%   BMI 33.38 kg/m  Wt Readings from Last 3 Encounters:  12/31/20 226 lb 0.6 oz (102.5 kg)  12/22/20 233 lb (105.7 kg)  12/11/20 229 lb 1.9 oz (103.9 kg)    Health Maintenance Due  Topic Date Due   OPHTHALMOLOGY EXAM  Never done   TETANUS/TDAP  Never done   COLONOSCOPY (Pts 45-40yr Insurance coverage will need to be confirmed)  Never done   Zoster Vaccines- Shingrix (1 of 2) Never done   PNA vac Low Risk Adult (1 of 2 - PCV13) Never done   COVID-19 Vaccine (3 - Booster for Pfizer series) 12/31/2019   INFLUENZA VACCINE  11/30/2020    There are no preventive  care reminders to display for this patient.   Lab Results  Component Value Date   TSH 5.230 (H) 10/23/2020   Lab Results  Component Value Date   WBC 8.1 12/28/2020   HGB 13.5 12/28/2020   HCT 40.8 12/28/2020   MCV 88 12/28/2020   PLT 208 12/28/2020   Lab Results  Component Value Date   NA 129 (L) 12/28/2020   K 3.9 12/28/2020   CO2 16 (L) 12/28/2020   GLUCOSE 51 (L) 12/28/2020   BUN 47 (H) 12/28/2020   CREATININE 3.43 (H) 12/28/2020   BILITOT 0.6 10/23/2020   ALKPHOS 91 10/23/2020   AST 54 (H) 10/23/2020   ALT 55 (H) 10/23/2020   PROT 7.6 10/23/2020   ALBUMIN 4.1 10/23/2020   CALCIUM 8.3 (L) 12/28/2020   EGFR 18 (L) 12/28/2020   Lab Results  Component Value Date   CHOL 217 (H) 10/23/2020   Lab Results  Component Value Date   HDL 53 10/23/2020   Lab Results  Component Value Date   LDLCALC 139 (H) 10/23/2020   Lab Results  Component Value Date   TRIG 140 10/23/2020   Lab Results  Component Value Date   CHOLHDL 4.1 10/23/2020   Lab Results  Component Value Date   HGBA1C 7.7 (H) 10/23/2020       Assessment & Plan:   Problem List Items Addressed This Visit   None Visit Diagnoses     [place]    -  Primary AKI  on CKD due to dehydration in the setting of diarrhea Diarrhea now resolved Advised to take at least 64 ounces of fluid in a day Advised to stop taking Losartan-HCTZ for now Will repeat BMP in the next week    Relevant Orders   Basic Metabolic Panel (BMET)   Gastroenteritis     Likely viral Even  most bacterial gastroenteritis are self-resolving Symptoms are improving now, tolerates PO intake Needs to improve PO intake of fluids    Hyponatremia     Likely due to dehydration Will repeat BMP Hold Losartan-HCTZ for now    Relevant Orders   Basic Metabolic Panel (BMET)   Hypoglycemia     Due to Glipizide, hold for now Advised to carry snacks with him  Urgent care chart reviewed - was given IV fluids and  Glucagon. Medications reconciled and reviewed with the patient.   Relevant Orders   Basic Metabolic Panel (BMET)        No orders of the defined types were placed in this encounter.    [name], MD

## 2020-12-31 NOTE — Patient Instructions (Signed)
Please stop taking Losartan-HCTZ and Glipizide until your diarrhea improves.  Continue taking other medications for now.  Please make sure to stay hydrated by taking at least 64 ounces of fluid in a day and avoid skipping any meals.  Please get blood tests done in the next week.

## 2021-01-07 LAB — BASIC METABOLIC PANEL
BUN/Creatinine Ratio: 16 (ref 10–24)
BUN: 19 mg/dL (ref 8–27)
CO2: 22 mmol/L (ref 20–29)
Calcium: 9.2 mg/dL (ref 8.6–10.2)
Chloride: 103 mmol/L (ref 96–106)
Creatinine, Ser: 1.2 mg/dL (ref 0.76–1.27)
Glucose: 141 mg/dL — ABNORMAL HIGH (ref 65–99)
Potassium: 4.5 mmol/L (ref 3.5–5.2)
Sodium: 141 mmol/L (ref 134–144)
eGFR: 64 mL/min/{1.73_m2} (ref >59)

## 2021-01-16 ENCOUNTER — Other Ambulatory Visit: Payer: Self-pay

## 2021-01-16 ENCOUNTER — Ambulatory Visit
Admission: EM | Admit: 2021-01-16 | Discharge: 2021-01-16 | Disposition: A | Discharge status: Home / Self Care | Payer: Medicare Other | Attending: Family Medicine | Admitting: Family Medicine

## 2021-01-16 DIAGNOSIS — J209 Acute bronchitis, unspecified: Secondary | ICD-10-CM

## 2021-01-16 MED ORDER — DOXYCYCLINE HYCLATE 100 MG PO CAPS: 100.0000 mg | ORAL_CAPSULE | Freq: Two times a day (BID) | ORAL | 0 refills | Status: AC

## 2021-01-16 MED ORDER — BENZONATATE 100 MG PO CAPS: 200.0000 mg | ORAL_CAPSULE | Freq: Three times a day (TID) | ORAL | 0 refills | Status: DC | PRN

## 2021-01-16 NOTE — ED Provider Notes (Signed)
[facility]    CSN: [id number] Arrival date & time: 01/16/21  1122      History   Chief Complaint No chief complaint on file.   HPI Jonathan [name] is a 75 y.o. male.   HPI Patient with a medical history significant of CAD, type 2 diabetes and hypertension presents today for evaluation of an ongoing cough for over a week.  He endorses some nasal congestion which is chronic he takes allergy medicine daily for chronic rhinitis.  Denies any known sick contacts.  He is afebrile.  Denies any shortness of breath.  Also notes that he started having diarrhea today.  Patient seen here in clinic a few weeks back with hypovolemia related to fluid volume loss.  He has since seen his PCP.  Past Medical History:  Diagnosis Date   Arthritis    ra, sees dr Sedonia Small   Coronary artery disease    saw dr zachery in [place] 20 yrs ago, released from cardiology   Diabetes mellitus without complication [facility])    type 2   Hypertension    Urethral stricture     Patient Active Problem List   Diagnosis Date Noted   Hemorrhoids 12/22/2020   UTI (urinary tract infection) 10/26/2020   Diabetes ([place]) 07/01/2008   Chronic kidney disease, stage III (moderate) ([place]) 07/16/2007   MONOCYTOSIS SYMPTOMATIC 04/23/2007   BPH (benign prostatic hyperplasia) 04/16/2007   Constipation 10/16/2006   Hyperlipidemia LDL goal <100 04/06/2006   Essential hypertension 04/06/2006   CARDIAC ARRHYTHMIA 04/06/2006   Allergic rhinitis 04/06/2006   GERD (gastroesophageal reflux disease) 04/06/2006   HIATAL HERNIA 04/06/2006   Rheumatoid arthritis ([place]) 04/06/2006   LOW BACK PAIN 04/06/2006    Past Surgical History:  Procedure Laterality Date   BACK SURGERY     x 3 lower back   CARDIAC CATHETERIZATION  2000   small amt plaque relased from cardiology 20 yrs ago   [place] N/A 04/17/2019   Procedure: CYSTOSCOPY WITH URETHRAL DILATATION, CYSTOGRAM;   Surgeon: Ceasar Mons, MD;  Location: [facility];  Service: Urology;  Laterality: N/A;   EYE SURGERY Bilateral    cataracts   urethral stricture surgery  15 yrs ago       Home Medications    Prior to Admission medications   Medication Sig Start Date End Date Taking? Authorizing Provider  benzonatate (TESSALON) 100 MG capsule Take 2 capsules (200 mg total) by mouth 3 (three) times daily as needed for cough. 01/16/21  Yes Scot Jun, FNP  doxycycline (VIBRAMYCIN) 100 MG capsule Take 1 capsule (100 mg total) by mouth 2 (two) times daily for 7 days. 01/16/21 01/23/21 Yes Scot Jun, FNP  amLODipine (NORVASC) 10 MG tablet Take 1 tablet (10 mg total) by mouth daily. 11/03/20   [name], MD  aspirin EC 81 MG tablet Take 81 mg by mouth daily. Swallow whole.    [provider]  Cyanocobalamin 2500 MCG CHEW Chew by mouth daily.    [provider]  dicyclomine (BENTYL) 20 MG tablet Take 1 tablet (20 mg total) by mouth 2 (two) times daily between meals as needed for spasms. 12/28/20   Scot Jun, FNP  famotidine (PEPCID) 20 MG tablet Take 1 tablet (20 mg total) by mouth 2 (two) times daily. 12/28/20   Scot Jun, FNP  folic acid (FOLVITE) 1 MG tablet Take 1 mg by mouth daily.  [provider]  glipiZIDE (GLUCOTROL) 5 MG tablet Take 1 tablet (5 mg total) by mouth 2 (two) times daily before a meal. 11/03/20   Posey Pronto, Colin Broach, MD  hydrocortisone (ANUSOL-HC) 25 MG suppository Place 1 suppository (25 mg total) rectally 2 (two) times daily as needed for hemorrhoids or anal itching. 12/22/20   [name], MD  losartan-hydrochlorothiazide (HYZAAR) 100-25 MG tablet Take 1 tablet by mouth daily. 11/30/20   [name], MD  methocarbamol (ROBAXIN) 500 MG tablet Take 1 tablet (500 mg total) by mouth every 6 (six) hours as needed for muscle spasms. 10/20/20   [name], MD  methotrexate (RHEUMATREX) 2.5 MG tablet  Caution:Chemotherapy. Protect from light. Take 5 tablets PO once weekly.    [provider]  metoprolol succinate (TOPROL-XL) 50 MG 24 hr tablet Take 50 mg by mouth 2 (two) times daily. Take with or immediately following a meal.    [provider]  omeprazole (PRILOSEC) 20 MG capsule Take 1 capsule (20 mg total) by mouth daily. 11/03/20   [name], MD  ondansetron (ZOFRAN) 4 MG tablet Take 1 tablet (4 mg total) by mouth every 6 (six) hours. 12/28/20   Scot Jun, FNP  predniSONE (DELTASONE) 2.5 MG tablet Take 2.5 mg by mouth daily with breakfast.    [provider]  tamsulosin (FLOMAX) 0.4 MG CAPS capsule Take 1 capsule (0.4 mg total) by mouth daily after supper. 11/03/20   [name], MD  triamcinolone cream (KENALOG) 0.1 % Apply 1 application topically 2 (two) times daily. 10/22/20   [name], MD    Family History History reviewed. No pertinent family history.  Social History Social History   Tobacco Use   Smoking status: Never   Smokeless tobacco: Never  Vaping Use   Vaping Use: Never used  Substance Use Topics   Alcohol use: No   Drug use: No     Allergies   Ciprofloxacin and Oxycodone-acetaminophen   Review of Systems Review of Systems Pertinent negatives listed in HPI   Physical Exam Triage Vital Signs ED Triage Vitals  Enc Vitals Group     BP 01/16/21 1338 (!) 151/74     Pulse Rate 01/16/21 1338 75     Resp 01/16/21 1338 18     Temp 01/16/21 1338 98.1 F (36.7 C)     Temp src --      SpO2 01/16/21 1338 95 %     Weight --      Height --      Head Circumference --      Peak Flow --      Pain Score 01/16/21 1337 2     Pain Loc --      Pain Edu? --      Excl. in [place]? --    No data found.  Updated Vital Signs BP (!) 151/74   Pulse 75   Temp 98.1 F (36.7 C)   Resp 18   SpO2 95%   Visual Acuity Right Eye Distance:   Left Eye Distance:   Bilateral Distance:    Right Eye Near:   Left Eye Near:     Bilateral Near:     Physical Exam  General Appearance:    Alert, cooperative, no distress  HENT:   Normocephalic, ears normal, nares mucosal edema with congestion, rhinorrhea, oropharynx without exudate or erythema  Eyes:    PERRL, conjunctiva/corneas clear, EOM's intact  Lungs:     Clear to auscultation bilaterally, respirations unlabored  Heart:    Regular rate and rhythm  Neurologic:   Awake, alert, oriented x 3. No apparent focal neurological           defect.      UC Treatments / Results  Labs (all labs ordered are listed, but only abnormal results are displayed) Labs Reviewed - No data to display  EKG   Radiology No results found.  Procedures Procedures (including critical care time)  Medications Ordered in UC Medications - No data to display  Initial Impression / Assessment and Plan / UC Course  I have reviewed the triage vital signs and the nursing notes.  Pertinent labs & imaging results that were available during my care of the patient were reviewed by me and considered in my medical decision making (see chart for details).     Acute bronchitis treatment per discharge instructions.  Patient advised if any symptoms worsen or he develops any shortness of breath go immediately to the ER Final Clinical Impressions(s) / UC Diagnoses   Final diagnoses:  Acute bronchitis, unspecified organism   Discharge Instructions   None    ED Prescriptions     Medication Sig Dispense Auth. Provider   doxycycline (VIBRAMYCIN) 100 MG capsule Take 1 capsule (100 mg total) by mouth 2 (two) times daily for 7 days. 14 capsule Scot Jun, FNP   benzonatate (TESSALON) 100 MG capsule Take 2 capsules (200 mg total) by mouth 3 (three) times daily as needed for cough. 40 capsule Scot Jun, FNP      PDMP not reviewed this encounter.   Scot Jun, FNP 01/17/21 [phone]

## 2021-01-16 NOTE — ED Triage Notes (Signed)
Pt presents with cough for past week then began having diarrhea today, denies fever

## 2021-02-02 LAB — HM DIABETES EYE EXAM

## 2021-02-03 ENCOUNTER — Encounter: Payer: Self-pay | Admitting: Urology

## 2021-02-03 ENCOUNTER — Ambulatory Visit (INDEPENDENT_AMBULATORY_CARE_PROVIDER_SITE_OTHER): Payer: Medicare Other | Admitting: Urology

## 2021-02-03 ENCOUNTER — Other Ambulatory Visit: Payer: Self-pay

## 2021-02-03 VITALS — BP 100/67 | HR 80

## 2021-02-03 DIAGNOSIS — R3129 Other microscopic hematuria: Secondary | ICD-10-CM

## 2021-02-03 DIAGNOSIS — N400 Enlarged prostate without lower urinary tract symptoms: Secondary | ICD-10-CM

## 2021-02-03 LAB — URINALYSIS, ROUTINE W REFLEX MICROSCOPIC
Bilirubin, UA: NEGATIVE
Glucose, UA: NEGATIVE
Ketones, UA: NEGATIVE
Leukocytes,UA: NEGATIVE
Nitrite, UA: NEGATIVE
Protein,UA: NEGATIVE
Specific Gravity, UA: 1.02 (ref 1.005–1.030)
Urobilinogen, Ur: 0.2 mg/dL (ref 0.2–1.0)
pH, UA: 5.5 (ref 5.0–7.5)

## 2021-02-03 LAB — MICROSCOPIC EXAMINATION
Bacteria, UA: NONE SEEN
Renal Epithel, UA: NONE SEEN /hpf

## 2021-02-03 MED ORDER — ALFUZOSIN HCL ER 10 MG PO TB24
10.0000 mg | ORAL_TABLET | Freq: Every day | ORAL | 11 refills | Status: DC
Start: 2021-02-03 — End: 2021-02-16

## 2021-02-03 NOTE — Progress Notes (Signed)
Urological Symptom Review  Patient is experiencing the following symptoms: Burning/pain with urination Get up at night to urinate Trouble starting stream Urinary tract infection Weak stream   Review of Systems  Gastrointestinal (upper)  : Indigestion/heartburn  Gastrointestinal (lower) : Negative for lower GI symptoms  Constitutional : Negative for symptoms  Skin: Skin rash/lesion Itching  Eyes: Negative for eye symptoms  Ear/Nose/Throat : Sinus problems  Hematologic/Lymphatic: Negative for Hematologic/Lymphatic symptoms  Cardiovascular : Negative for cardiovascular symptoms  Respiratory : Cough  Endocrine: Negative for endocrine symptoms  Musculoskeletal: Back pain Joint pain  Neurological: Dizziness  Psychologic: Negative for psychiatric symptoms

## 2021-02-03 NOTE — Patient Instructions (Signed)

## 2021-02-03 NOTE — Progress Notes (Signed)
02/03/2021 1:56 PM   Ane Payment 02/06/1946 [id number]  Referring provider: [name], MD [address] [place],  [name] [address]  BPh and microhematuria  HPI: Mr Jonathan [name] is a 75yo here for evaluation for BPH and microhematuria. UA today shows 11-30 RBCs/hpf. IPSS 10 QOL 5. He has nocturia 2x. He has a weak urinary stream. He has a feeling of incomplete emptying. He is on flomax but has not noticed an improvement in his LUTS. He had a cystoscopy 2 years ago with Dr. Lovena Neighbours at [place].    PMH: Past Medical History:  Diagnosis Date   Arthritis    ra, sees dr Sedonia Small   Coronary artery disease    saw dr zachery in [place] 20 yrs ago, released from cardiology   Diabetes mellitus without complication ([place])    type 2   Hypertension    Urethral stricture     Surgical History: Past Surgical History:  Procedure Laterality Date   BACK SURGERY     x 3 lower back   CARDIAC CATHETERIZATION  2000   small amt plaque relased from cardiology 20 yrs ago   [place] N/A 04/17/2019   Procedure: CYSTOSCOPY WITH URETHRAL DILATATION, CYSTOGRAM;  Surgeon: Ceasar Mons, MD;  [facility];  Service: Urology;  Laterality: N/A;   EYE SURGERY Bilateral    cataracts   urethral stricture surgery  15 yrs ago    Home Medications:  Allergies as of 02/03/2021       Reactions   Ciprofloxacin    Oxycodone-acetaminophen Rash        Medication List        Accurate as of February 03, 2021  1:56 PM. If you have any questions, ask your nurse or doctor.          amLODipine 10 MG tablet Commonly known as: NORVASC Take 1 tablet (10 mg total) by mouth daily.   aspirin EC 81 MG tablet Take 81 mg by mouth daily. Swallow whole.   benzonatate 100 MG capsule Commonly known as: TESSALON Take 2 capsules (200 mg total) by mouth 3 (three) times daily as needed for cough.   Cyanocobalamin 2500 MCG  Chew Chew by mouth daily.   dicyclomine 20 MG tablet Commonly known as: BENTYL Take 1 tablet (20 mg total) by mouth 2 (two) times daily between meals as needed for spasms.   famotidine 20 MG tablet Commonly known as: PEPCID Take 1 tablet (20 mg total) by mouth 2 (two) times daily.   folic acid 1 MG tablet Commonly known as: FOLVITE Take 1 mg by mouth daily.   glipiZIDE 5 MG tablet Commonly known as: GLUCOTROL Take 1 tablet (5 mg total) by mouth 2 (two) times daily before a meal.   hydrocortisone 25 MG suppository Commonly known as: Anusol-HC Place 1 suppository (25 mg total) rectally 2 (two) times daily as needed for hemorrhoids or anal itching.   losartan-hydrochlorothiazide 100-25 MG tablet Commonly known as: HYZAAR Take 1 tablet by mouth daily.   methocarbamol 500 MG tablet Commonly known as: Robaxin Take 1 tablet (500 mg total) by mouth every 6 (six) hours as needed for muscle spasms.   methotrexate 2.5 MG tablet Commonly known as: RHEUMATREX Caution:Chemotherapy. Protect from light. Take 5 tablets PO once weekly.   metoprolol succinate 50 MG 24 hr tablet Commonly known as: TOPROL-XL Take 50 mg by mouth 2 (two) times daily. Take with  or immediately following a meal.   omeprazole 20 MG capsule Commonly known as: PRILOSEC Take 1 capsule (20 mg total) by mouth daily.   ondansetron 4 MG tablet Commonly known as: ZOFRAN Take 1 tablet (4 mg total) by mouth every 6 (six) hours.   predniSONE 2.5 MG tablet Commonly known as: DELTASONE Take 2.5 mg by mouth daily with breakfast.   tamsulosin 0.4 MG Caps capsule Commonly known as: FLOMAX Take 1 capsule (0.4 mg total) by mouth daily after supper.   triamcinolone cream 0.1 % Commonly known as: KENALOG Apply 1 application topically 2 (two) times daily.        Allergies:  Allergies  Allergen Reactions   Ciprofloxacin    Oxycodone-Acetaminophen Rash    Family History: No family history on file.  Social  History:  reports that he has never smoked. He has never used smokeless tobacco. He reports that he does not drink alcohol and does not use drugs.  ROS: All other review of systems were reviewed and are negative except what is noted above in HPI  Physical Exam: BP 100/67   Pulse 80   Constitutional:  Alert and oriented, No acute distress. HEENT: [place] AT, moist mucus membranes.  Trachea midline, no masses. Cardiovascular: No clubbing, cyanosis, or edema. Respiratory: Normal respiratory effort, no increased work of breathing. GI: Abdomen is soft, nontender, nondistended, no abdominal masses GU: No CVA tenderness. Circumcised phallus. No masses/lesions on penis, testis, scrotum. Prostate 40g smooth no nodules no induration.  Lymph: No cervical or inguinal lymphadenopathy. Skin: No rashes, bruises or suspicious lesions. Neurologic: Grossly intact, no focal deficits, moving all 4 extremities. Psychiatric: Normal mood and affect.  Laboratory Data: Lab Results  Component Value Date   WBC 8.1 12/28/2020   HGB 13.5 12/28/2020   HCT 40.8 12/28/2020   MCV 88 12/28/2020   PLT 208 12/28/2020    Lab Results  Component Value Date   CREATININE 1.20 01/06/2021    No results found for: PSA  No results found for: TESTOSTERONE  Lab Results  Component Value Date   HGBA1C 7.7 (H) 10/23/2020    Urinalysis    Component Value Date/Time   COLORURINE yellow 04/16/2007 0000   APPEARANCEUR Clear 04/16/2007 0000   LABSPEC 1.020 04/16/2007 0000   PHURINE 5.5 04/16/2007 0000   GLUCOSEU Negative 12/11/2020 0831   HGBUR negative 04/16/2007 0000   BILIRUBINUR small (A) 12/28/2020 1532   BILIRUBINUR negative 12/11/2020 0831   KETONESUR negative 12/28/2020 1532   PROTEINUR =30 (A) 12/28/2020 1532   UROBILINOGEN 0.2 12/28/2020 1532   UROBILINOGEN negative 04/16/2007 0000   NITRITE Negative 12/28/2020 1532   NITRITE negative 12/11/2020 0831   NITRITE negative 04/16/2007 0000   LEUKOCYTESUR  Negative 12/28/2020 1532    Lab Results  Component Value Date   BACTERIA RARE 03/15/2007    Pertinent Imaging:  No results found for this or any previous visit.  No results found for this or any previous visit.  No results found for this or any previous visit.  No results found for this or any previous visit.  Results for orders placed during the hospital encounter of 12/17/20  US Renal  Narrative CLINICAL DATA:  Recurrent UTI in a 75 year old male.  EXAM: RENAL / URINARY TRACT ULTRASOUND COMPLETE  COMPARISON:  More remote studies dating back to 2005.  FINDINGS: Right Kidney:  Renal measurements: 9.5 x 4.0 x 5.1 cm = volume: 100.1 mL. Limited exam due to body habitus, no hydronephrosis. Mild parenchymal thinning.  Left Kidney:  Renal measurements: 9.8 x 5.2 x 5.2 cm = volume: 138.8 mL. Limited exam due to overlying bowel gas and body habitus. No hydronephrosis. Mild parenchymal thinning. Small cyst arising from the lower pole measuring 1.2 x 1.0 x 1.0 cm.  Bladder:  Appears normal for degree of bladder distention.  Other:  Increased hepatic echogenicity.  IMPRESSION: Mild cortical thinning without signs of hydronephrosis. Limited exam due to body habitus and bowel gas.  Small LEFT lower pole cyst.  Hepatic steatosis.   Electronically Signed By: Zetta Bills M.D. On: 12/18/2020 14:12  No results found for this or any previous visit.  No results found for this or any previous visit.  No results found for this or any previous visit.   Assessment & Plan:    1. Benign prostatic hyperplasia, unspecified whether lower urinary tract symptoms present -we will start uroxatral 10mg  qhs - Urinalysis, Routine w reflex microscopic  2. Microhematuria BMP CT hematuria Office cystoscopy   No follow-ups on file.  Nicolette Bang, MD  [facility] [place]

## 2021-02-04 LAB — BASIC METABOLIC PANEL
BUN/Creatinine Ratio: 18 (ref 10–24)
BUN: 30 mg/dL — ABNORMAL HIGH (ref 8–27)
CO2: 20 mmol/L (ref 20–29)
Calcium: 8.8 mg/dL (ref 8.6–10.2)
Chloride: 101 mmol/L (ref 96–106)
Creatinine, Ser: 1.68 mg/dL — ABNORMAL HIGH (ref 0.76–1.27)
Glucose: 137 mg/dL — ABNORMAL HIGH (ref 70–99)
Potassium: 3.8 mmol/L (ref 3.5–5.2)
Sodium: 138 mmol/L (ref 134–144)
eGFR: 43 mL/min/{1.73_m2} — ABNORMAL LOW (ref >59)

## 2021-02-09 ENCOUNTER — Other Ambulatory Visit: Payer: Self-pay

## 2021-02-09 ENCOUNTER — Ambulatory Visit (INDEPENDENT_AMBULATORY_CARE_PROVIDER_SITE_OTHER): Payer: Medicare Other | Admitting: Podiatry

## 2021-02-09 DIAGNOSIS — M722 Plantar fascial fibromatosis: Secondary | ICD-10-CM

## 2021-02-10 ENCOUNTER — Telehealth: Payer: Self-pay

## 2021-02-10 DIAGNOSIS — N400 Enlarged prostate without lower urinary tract symptoms: Secondary | ICD-10-CM

## 2021-02-10 NOTE — Telephone Encounter (Signed)
Patient called with reaction to uroxatrol since starting medication reports he has broke out into an itching rash.  Patient stopped taking the uroxatrol and started back his flomax but the flomax was not helping his urinary symptoms.

## 2021-02-12 ENCOUNTER — Encounter: Payer: Self-pay | Admitting: Podiatry

## 2021-02-12 NOTE — Progress Notes (Signed)
Subjective:  Patient ID: Jonathan [name], male    DOB: 01/13/1946,  MRN: [id number]  Chief Complaint  Patient presents with   Diabetes    Foot pain     75 y.o. male presents with the above complaint.  Patient presents complaint of left heel pain that has been going on for quite some time.  Patient states it hurts on the bottom foot when ambulating.  Hurts with every step.  Hurts when taking a for step in the morning.  Pain scale is 8 out of 10 sharp shooting in nature.  He is a diabetic.  He would like to get it evaluated.  He has not been treated in the past.  He has not tried anything for it he denies any other acute complaints.   Review of Systems: Negative except as noted in the HPI. Denies N/V/F/Ch.  Past Medical History:  Diagnosis Date   Arthritis    ra, sees dr Sedonia Small   Coronary artery disease    saw dr zachery in [place] 20 yrs ago, released from cardiology   Diabetes mellitus without complication ([place])    type 2   Hypertension    Urethral stricture     Current Outpatient Medications:    alfuzosin (UROXATRAL) 10 MG 24 hr tablet, Take 1 tablet (10 mg total) by mouth daily with breakfast., Disp: 30 tablet, Rfl: 11   amLODipine (NORVASC) 10 MG tablet, Take 1 tablet (10 mg total) by mouth daily., Disp: 30 tablet, Rfl: 0   aspirin EC 81 MG tablet, Take 81 mg by mouth daily. Swallow whole., Disp: , Rfl:    benzonatate (TESSALON) 100 MG capsule, Take 2 capsules (200 mg total) by mouth 3 (three) times daily as needed for cough., Disp: 40 capsule, Rfl: 0   Cyanocobalamin 2500 MCG CHEW, Chew by mouth daily., Disp: , Rfl:    dicyclomine (BENTYL) 20 MG tablet, Take 1 tablet (20 mg total) by mouth 2 (two) times daily between meals as needed for spasms., Disp: 12 tablet, Rfl: 0   famotidine (PEPCID) 20 MG tablet, Take 1 tablet (20 mg total) by mouth 2 (two) times daily., Disp: 30 tablet, Rfl: 0   folic acid (FOLVITE) 1 MG tablet, Take 1 mg by mouth daily., Disp: , Rfl:     glipiZIDE (GLUCOTROL) 5 MG tablet, Take 1 tablet (5 mg total) by mouth 2 (two) times daily before a meal., Disp: 30 tablet, Rfl: 0   hydrocortisone (ANUSOL-HC) 25 MG suppository, Place 1 suppository (25 mg total) rectally 2 (two) times daily as needed for hemorrhoids or anal itching., Disp: 24 suppository, Rfl: 0   losartan-hydrochlorothiazide (HYZAAR) 100-25 MG tablet, Take 1 tablet by mouth daily., Disp: 90 tablet, Rfl: 0   methocarbamol (ROBAXIN) 500 MG tablet, Take 1 tablet (500 mg total) by mouth every 6 (six) hours as needed for muscle spasms., Disp: 30 tablet, Rfl: 0   methotrexate (RHEUMATREX) 2.5 MG tablet, Caution:Chemotherapy. Protect from light. Take 5 tablets PO once weekly., Disp: , Rfl:    metoprolol succinate (TOPROL-XL) 50 MG 24 hr tablet, Take 50 mg by mouth 2 (two) times daily. Take with or immediately following a meal., Disp: , Rfl:    omeprazole (PRILOSEC) 20 MG capsule, Take 1 capsule (20 mg total) by mouth daily., Disp: 30 capsule, Rfl: 0   ondansetron (ZOFRAN) 4 MG tablet, Take 1 tablet (4 mg total) by mouth every 6 (six) hours., Disp: 8 tablet, Rfl: 0   predniSONE (DELTASONE) 2.5 MG  tablet, Take 2.5 mg by mouth daily with breakfast., Disp: , Rfl:    tamsulosin (FLOMAX) 0.4 MG CAPS capsule, Take 1 capsule (0.4 mg total) by mouth daily after supper., Disp: 30 capsule, Rfl: 5   triamcinolone cream (KENALOG) 0.1 %, Apply 1 application topically 2 (two) times daily., Disp: 30 g, Rfl: 0  Social History   Tobacco Use  Smoking Status Never  Smokeless Tobacco Never    Allergies  Allergen Reactions   Ciprofloxacin    Oxycodone-Acetaminophen Rash   Objective:  There were no vitals filed for this visit. There is no height or weight on file to calculate BMI. Constitutional Well developed. Well nourished.  Vascular Dorsalis pedis pulses palpable bilaterally. Posterior tibial pulses palpable bilaterally. Capillary refill normal to all digits.  No cyanosis or clubbing  noted. Pedal hair growth normal.  Neurologic Normal speech. Oriented to person, place, and time. Epicritic sensation to light touch grossly present bilaterally.  Dermatologic Nails well groomed and normal in appearance. No open wounds. No skin lesions.  Orthopedic: Normal joint ROM without pain or crepitus bilaterally. No visible deformities. Tender to palpation at the calcaneal tuber left. No pain with calcaneal squeeze left. Ankle ROM diminished range of motion left. Silf[place]old Test: positive left.   Radiographs: None  Assessment:   1. Plantar fasciitis of left foot    Plan:  Patient was evaluated and treated and all questions answered.  Plantar Fasciitis, left - XR reviewed as above.  - Educated on icing and stretching. Instructions given.  - Injection delivered to the plantar fascia as below. - DME: Plantar Fascial Brace - Pharmacologic management: None  Procedure: Injection Tendon/Ligament Location: Left plantar fascia at the glabrous junction; medial approach. Skin Prep: alcohol Injectate: 0.5 cc 0.5% marcaine plain, 0.5 cc of 1% Lidocaine, 0.5 cc kenalog 10. Disposition: Patient tolerated procedure well. Injection site dressed with a band-aid.  No follow-ups on file.

## 2021-02-16 ENCOUNTER — Telehealth: Payer: Self-pay | Admitting: Podiatry

## 2021-02-16 ENCOUNTER — Encounter: Payer: Self-pay | Admitting: *Deleted

## 2021-02-16 ENCOUNTER — Other Ambulatory Visit: Payer: Self-pay | Admitting: Podiatry

## 2021-02-16 MED ORDER — SILODOSIN 8 MG PO CAPS: 8.0000 mg | ORAL_CAPSULE | Freq: Every day | ORAL | 3 refills | Status: DC

## 2021-02-16 MED ORDER — TRAMADOL HCL 50 MG PO TABS: 50.0000 mg | ORAL_TABLET | Freq: Three times a day (TID) | ORAL | 0 refills | Status: AC | PRN

## 2021-02-16 NOTE — Telephone Encounter (Signed)
PT call and ask for pain medication he feel the shot he got is not helping him and he is in so much pain

## 2021-02-16 NOTE — Telephone Encounter (Signed)
Rx sent to pharmacy locally. Left message to return call to office.

## 2021-02-24 ENCOUNTER — Ambulatory Visit (INDEPENDENT_AMBULATORY_CARE_PROVIDER_SITE_OTHER): Payer: Medicare Other | Admitting: Internal Medicine

## 2021-02-24 ENCOUNTER — Encounter: Payer: Self-pay | Admitting: Internal Medicine

## 2021-02-24 ENCOUNTER — Other Ambulatory Visit: Payer: Self-pay

## 2021-02-24 VITALS — BP 127/77 | HR 100 | Temp 98.1°F | Resp 18 | Ht 69.0 in | Wt 223.1 lb

## 2021-02-24 DIAGNOSIS — K219 Gastro-esophageal reflux disease without esophagitis: Secondary | ICD-10-CM

## 2021-02-24 DIAGNOSIS — E1121 Type 2 diabetes mellitus with diabetic nephropathy: Secondary | ICD-10-CM | POA: Diagnosis not present

## 2021-02-24 DIAGNOSIS — Z7952 Long term (current) use of systemic steroids: Secondary | ICD-10-CM | POA: Diagnosis not present

## 2021-02-24 DIAGNOSIS — I10 Essential (primary) hypertension: Secondary | ICD-10-CM

## 2021-02-24 DIAGNOSIS — E038 Other specified hypothyroidism: Secondary | ICD-10-CM

## 2021-02-24 DIAGNOSIS — E785 Hyperlipidemia, unspecified: Secondary | ICD-10-CM

## 2021-02-24 MED ORDER — ROSUVASTATIN CALCIUM 10 MG PO TABS: 10.0000 mg | ORAL_TABLET | Freq: Every day | ORAL | 1 refills | Status: DC

## 2021-02-24 NOTE — Patient Instructions (Signed)
Please continue taking medications as prescribed.  Please continue to follow low-carb diet and ambulate as tolerated.  Please ask your rheumatologist about prednisone.  Continue to check blood glucose regularly and bring the log in the next visit.

## 2021-02-24 NOTE — Assessment & Plan Note (Signed)
Lipid profile reviewed Started Crestor 10 mg daily

## 2021-02-24 NOTE — Progress Notes (Signed)
Established Patient Office Visit  Subjective:  Patient ID: Jonathan [name], male    DOB: [birth year]  Age: 75 y.o. MRN: [id number]  CC:  Chief Complaint  Patient presents with   Follow-up    4 month follow up has seen podiatry for feet said plantar fascitis has received shots and will go back and follow up     HPI Jonathan [name]  is a 75 year old male with PMH of HTN, DM, GERD, RA, CKD stage 3, BPH and obesity who presents for follow-up of his chronic medical conditions.  HTN: BP is well-controlled. Takes medications regularly. Patient denies headache, dizziness, chest pain, dyspnea or palpitations.  Type II DM: His HbA1C was 7.7.  He has been taking glipizide once daily as his blood glucose was dropping below 70 when he took it twice daily.  His blood glucose has been running between 80-120 now with Glipizide 5 mg QD.  Denies any fatigue, polyuria or polydipsia.  He also has a history of CKD stage III.  HLD: Last blood tests have been reviewed and discussed with the patient in detail.  Started Crestor for HLD.  GERD: He takes omeprazole.  Denies any dysphagia or odynophagia.  He complains of feet pain and sees podiatry for plantar fasciitis.   Past Medical History:  Diagnosis Date   Arthritis    ra, sees dr Sedonia Small   Coronary artery disease    saw dr zachery in [place] 20 yrs ago, released from cardiology   Diabetes mellitus without complication ([place])    type 2   Hypertension    Urethral stricture     Past Surgical History:  Procedure Laterality Date   BACK SURGERY     x 3 lower back   CARDIAC CATHETERIZATION  2000   small amt plaque relased from cardiology 20 yrs ago   [place] N/A 04/17/2019   Procedure: CYSTOSCOPY WITH URETHRAL DILATATION, CYSTOGRAM;  Surgeon: Ceasar Mons, MD;  Location: [facility];  Service: Urology;  Laterality: N/A;   EYE SURGERY Bilateral    cataracts    urethral stricture surgery  15 yrs ago    History reviewed. No pertinent family history.  Social History   Socioeconomic History   Marital status: Widowed    Spouse name: Not on file   Number of children: Not on file   Years of education: Not on file   Highest education level: Not on file  Occupational History   Not on file  Tobacco Use   Smoking status: Never   Smokeless tobacco: Never  Vaping Use   Vaping Use: Never used  Substance and Sexual Activity   Alcohol use: No   Drug use: No   Sexual activity: Not on file  Other Topics Concern   Not on file  Social History Narrative   Not on file   Social Determinants of Health   Financial Resource Strain: Low Risk    Difficulty of Paying Living Expenses: Not hard at all  Food Insecurity: No Food Insecurity   Worried About Running Out of Food in the Last Year: Never true   [place]s in the Last Year: Never true  Transportation Needs: No Transportation Needs   Lack of Transportation (Medical): No   Lack of Transportation (Non-Medical): No  Physical Activity: Insufficiently Active   Days of Exercise per Week: 3 days   Minutes of Exercise per Session: 30 min  Stress: No Stress Concern Present   Feeling of Stress : Not at all  Social Connections: Moderately Integrated   Frequency of Communication with Friends and Family: More than three times a week   Frequency of Social Gatherings with Friends and Family: More than three times a week   Attends Religious Services: More than 4 times per year   Active Member of [employer] or Organizations: No   Attends Archivist Meetings: Never   Marital Status: Married  Human resources officer Violence: Not At Risk   Fear of Current or Ex-Partner: No   Emotionally Abused: No   Physically Abused: No   Sexually Abused: No    Outpatient Medications Prior to Visit  Medication Sig Dispense Refill   amLODipine (NORVASC) 10 MG tablet Take 1 tablet (10 mg total) by mouth daily. 30 tablet  0   aspirin EC 81 MG tablet Take 81 mg by mouth daily. Swallow whole.     Cyanocobalamin 2500 MCG CHEW Chew by mouth daily.     dicyclomine (BENTYL) 20 MG tablet Take 1 tablet (20 mg total) by mouth 2 (two) times daily between meals as needed for spasms. 12 tablet 0   famotidine (PEPCID) 20 MG tablet Take 1 tablet (20 mg total) by mouth 2 (two) times daily. 30 tablet 0   folic acid (FOLVITE) 1 MG tablet Take 1 mg by mouth daily.     glipiZIDE (GLUCOTROL) 5 MG tablet Take 1 tablet (5 mg total) by mouth 2 (two) times daily before a meal. 30 tablet 0   hydrocortisone (ANUSOL-HC) 25 MG suppository Place 1 suppository (25 mg total) rectally 2 (two) times daily as needed for hemorrhoids or anal itching. 24 suppository 0   losartan-hydrochlorothiazide (HYZAAR) 100-25 MG tablet Take 1 tablet by mouth daily. 90 tablet 0   methocarbamol (ROBAXIN) 500 MG tablet Take 1 tablet (500 mg total) by mouth every 6 (six) hours as needed for muscle spasms. 30 tablet 0   methotrexate (RHEUMATREX) 2.5 MG tablet Caution:Chemotherapy. Protect from light. Take 5 tablets PO once weekly.     metoprolol succinate (TOPROL-XL) 50 MG 24 hr tablet Take 50 mg by mouth 2 (two) times daily. Take with or immediately following a meal.     omeprazole (PRILOSEC) 20 MG capsule Take 1 capsule (20 mg total) by mouth daily. 30 capsule 0   ondansetron (ZOFRAN) 4 MG tablet Take 1 tablet (4 mg total) by mouth every 6 (six) hours. 8 tablet 0   predniSONE (DELTASONE) 2.5 MG tablet Take 2.5 mg by mouth daily with breakfast.     silodosin (RAPAFLO) 8 MG CAPS capsule Take 1 capsule (8 mg total) by mouth daily with breakfast. 30 capsule 3   triamcinolone cream (KENALOG) 0.1 % Apply 1 application topically 2 (two) times daily. 30 g 0   benzonatate (TESSALON) 100 MG capsule Take 2 capsules (200 mg total) by mouth 3 (three) times daily as needed for cough. (Patient not taking: Reported on 02/24/2021) 40 capsule 0   No facility-administered medications  prior to visit.    Allergies  Allergen Reactions   Ciprofloxacin    Oxycodone-Acetaminophen Rash    ROS Review of Systems  Constitutional:  Negative for chills and fever.  HENT:  Negative for congestion and sore throat.   Eyes:  Negative for pain and discharge.  Respiratory:  Negative for cough and shortness of breath.   Cardiovascular:  Negative for chest pain and palpitations.  Gastrointestinal:  Negative for constipation, diarrhea, nausea and  vomiting.  Endocrine: Negative for polydipsia and polyuria.  Genitourinary:  Negative for dysuria and hematuria.  Musculoskeletal:  Positive for arthralgias and back pain. Negative for neck pain and neck stiffness.  Skin:  Negative for rash.  Neurological:  Negative for dizziness, weakness, numbness and headaches.  Psychiatric/Behavioral:  Negative for agitation and behavioral problems.      Objective:    Physical Exam Vitals reviewed.  Constitutional:      General: He is not in acute distress.    Appearance: He is obese. He is not diaphoretic.  HENT:     Head: Normocephalic and atraumatic.     Nose: Nose normal.     Mouth/Throat:     Mouth: Mucous membranes are moist.  Eyes:     General: No scleral icterus.    Extraocular Movements: Extraocular movements intact.  Cardiovascular:     Rate and Rhythm: Normal rate and regular rhythm.     Pulses: Normal pulses.     Heart sounds: Normal heart sounds. No murmur heard. Pulmonary:     Breath sounds: Normal breath sounds. No wheezing or rales.  Abdominal:     Palpations: Abdomen is soft.     Tenderness: There is no abdominal tenderness. There is no right CVA tenderness or left CVA tenderness.  Musculoskeletal:     Cervical back: Neck supple. No tenderness.     Right lower leg: No edema.     Left lower leg: No edema.  Skin:    General: Skin is warm.     Findings: No rash.  Neurological:     General: No focal deficit present.     Mental Status: He is alert and oriented to  person, place, and time.     Sensory: No sensory deficit.     Motor: No weakness.  Psychiatric:        Mood and Affect: Mood normal.        Behavior: Behavior normal.    BP 127/77 (BP Location: Left Arm, Patient Position: Sitting, Cuff Size: Normal)   Pulse 100   Temp 98.1 F (36.7 C) (Oral)   Resp 18   Ht _0  (1.753 m)   Wt 223 lb 1.9 oz (101.2 kg)   SpO2 96%   BMI 32.95 kg/m  Wt Readings from Last 3 Encounters:  02/24/21 223 lb 1.9 oz (101.2 kg)  12/31/20 226 lb 0.6 oz (102.5 kg)  12/22/20 233 lb (105.7 kg)     Health Maintenance Due  Topic Date Due   Pneumonia Vaccine 59+ Years old (1 - PCV) Never done   TETANUS/TDAP  Never done   COLONOSCOPY (Pts 45-79yr Insurance coverage will need to be confirmed)  Never done   Zoster Vaccines- Shingrix (1 of 2) Never done   COVID-19 Vaccine (3 - Booster for Pfizer series) 09/25/2019   INFLUENZA VACCINE  11/30/2020    There are no preventive care reminders to display for this patient.  Lab Results  Component Value Date   TSH 5.230 (H) 10/23/2020   Lab Results  Component Value Date   WBC 8.1 12/28/2020   HGB 13.5 12/28/2020   HCT 40.8 12/28/2020   MCV 88 12/28/2020   PLT 208 12/28/2020   Lab Results  Component Value Date   NA 138 02/03/2021   K 3.8 02/03/2021   CO2 20 02/03/2021   GLUCOSE 137 (H) 02/03/2021   BUN 30 (H) 02/03/2021   CREATININE 1.68 (H) 02/03/2021   BILITOT 0.6 10/23/2020   ALKPHOS 91  10/23/2020   AST 54 (H) 10/23/2020   ALT 55 (H) 10/23/2020   PROT 7.6 10/23/2020   ALBUMIN 4.1 10/23/2020   CALCIUM 8.8 02/03/2021   EGFR 43 (L) 02/03/2021   Lab Results  Component Value Date   CHOL 217 (H) 10/23/2020   Lab Results  Component Value Date   HDL 53 10/23/2020   Lab Results  Component Value Date   LDLCALC 139 (H) 10/23/2020   Lab Results  Component Value Date   TRIG 140 10/23/2020   Lab Results  Component Value Date   CHOLHDL 4.1 10/23/2020   Lab Results  Component Value Date    HGBA1C 7.7 (H) 10/23/2020      Assessment & Plan:   Problem List Items Addressed This Visit       Cardiovascular and Mediastinum   Essential hypertension - Primary    BP Readings from Last 1 Encounters:  02/24/21 127/77  Overall well-controlled with Amlodipine, Losartan-HCTZ and Metoprolol Counseled for compliance with the medications Advised DASH diet and moderate exercise/walking, at least 150 mins/week      Relevant Medications   rosuvastatin (CRESTOR) 10 MG tablet     Digestive   GERD (gastroesophageal reflux disease)    On Omeprazole        Endocrine   Type II diabetes mellitus with nephropathy ([place])    Patient reports last HbA1C < 7 On Glipizide 5 mg QD, was taking it twice daily, but had episode of hypoglycemia and now taking it QD Blood glucose at home has been around 80-120 Advised to follow diabetic diet On ARB F/u CMP and HbA1C Diabetic eye exam: Advised to follow up with Ophthalmology for diabetic eye exam       Relevant Medications   rosuvastatin (CRESTOR) 10 MG tablet   Other Relevant Orders   CMP14+EGFR   HgB A1c   Subclinical hypothyroidism    Lab Results  Component Value Date   TSH 5.230 (H) 10/23/2020  Check TSH and free T4      Relevant Orders   TSH + free T4     Other   Hyperlipidemia LDL goal <100    Lipid profile reviewed Started Crestor 10 mg daily      Relevant Medications   rosuvastatin (CRESTOR) 10 MG tablet   Current chronic use of systemic steroids    Takes it for RA, followed by rheumatology Can be contributing to type II DM       Meds ordered this encounter  Medications   rosuvastatin (CRESTOR) 10 MG tablet    Sig: Take 1 tablet (10 mg total) by mouth daily.    Dispense:  90 tablet    Refill:  1    Follow-up: Return in about 4 months (around 06/27/2021) for HTN and DM.    [name], MD

## 2021-02-24 NOTE — Assessment & Plan Note (Signed)
On Omeprazole 

## 2021-02-24 NOTE — Assessment & Plan Note (Signed)
Lab Results  Component Value Date   TSH 5.230 (H) 10/23/2020   Check TSH and free T4

## 2021-02-24 NOTE — Assessment & Plan Note (Signed)
BP Readings from Last 1 Encounters:  02/24/21 127/77   Overall well-controlled with Amlodipine, Losartan-HCTZ and Metoprolol Counseled for compliance with the medications Advised DASH diet and moderate exercise/walking, at least 150 mins/week

## 2021-02-24 NOTE — Assessment & Plan Note (Signed)
Takes it for RA, followed by rheumatology Can be contributing to type II DM

## 2021-02-24 NOTE — Assessment & Plan Note (Signed)
Patient reports last HbA1C < 7 On Glipizide 5 mg QD, was taking it twice daily, but had episode of hypoglycemia and now taking it QD Blood glucose at home has been around 80-120 Advised to follow diabetic diet On ARB F/u CMP and HbA1C Diabetic eye exam: Advised to follow up with Ophthalmology for diabetic eye exam

## 2021-02-25 LAB — CMP14+EGFR
ALT: 28 IU/L (ref 0–44)
AST: 31 IU/L (ref 0–40)
Albumin/Globulin Ratio: 1 — ABNORMAL LOW (ref 1.2–2.2)
Albumin: 3.8 g/dL (ref 3.7–4.7)
Alkaline Phosphatase: 87 IU/L (ref 44–121)
BUN/Creatinine Ratio: 19 (ref 10–24)
BUN: 26 mg/dL (ref 8–27)
Bilirubin Total: 0.5 mg/dL (ref 0.0–1.2)
CO2: 22 mmol/L (ref 20–29)
Calcium: 9.4 mg/dL (ref 8.6–10.2)
Chloride: 100 mmol/L (ref 96–106)
Creatinine, Ser: 1.36 mg/dL — ABNORMAL HIGH (ref 0.76–1.27)
Globulin, Total: 3.9 g/dL (ref 1.5–4.5)
Glucose: 157 mg/dL — ABNORMAL HIGH (ref 70–99)
Potassium: 4.2 mmol/L (ref 3.5–5.2)
Sodium: 136 mmol/L (ref 134–144)
Total Protein: 7.7 g/dL (ref 6.0–8.5)
eGFR: 55 mL/min/{1.73_m2} — ABNORMAL LOW (ref >59)

## 2021-02-25 LAB — TSH+FREE T4
Free T4: 1.04 ng/dL (ref 0.82–1.77)
TSH: 5.17 u[IU]/mL — ABNORMAL HIGH (ref 0.450–4.500)

## 2021-02-25 LAB — HEMOGLOBIN A1C
Est. average glucose Bld gHb Est-mCnc: 151 mg/dL
Hgb A1c MFr Bld: 6.9 % — ABNORMAL HIGH (ref 4.8–5.6)

## 2021-03-02 ENCOUNTER — Ambulatory Visit (INDEPENDENT_AMBULATORY_CARE_PROVIDER_SITE_OTHER): Payer: Medicare Other

## 2021-03-02 ENCOUNTER — Other Ambulatory Visit: Payer: Self-pay

## 2021-03-02 DIAGNOSIS — Z23 Encounter for immunization: Secondary | ICD-10-CM

## 2021-03-09 ENCOUNTER — Ambulatory Visit: Payer: Medicare Other | Admitting: Podiatry

## 2021-03-09 ENCOUNTER — Other Ambulatory Visit: Payer: Self-pay | Admitting: Internal Medicine

## 2021-03-09 DIAGNOSIS — I10 Essential (primary) hypertension: Secondary | ICD-10-CM

## 2021-03-24 ENCOUNTER — Other Ambulatory Visit: Payer: Self-pay

## 2021-03-24 ENCOUNTER — Ambulatory Visit (HOSPITAL_COMMUNITY)
Admission: RE | Admit: 2021-03-24 | Discharge: 2021-03-24 | Disposition: A | Discharge status: Home / Self Care | Payer: Medicare Other | Source: Ambulatory Visit | Attending: Urology | Admitting: Urology

## 2021-03-24 ENCOUNTER — Encounter (HOSPITAL_COMMUNITY): Payer: Self-pay

## 2021-03-24 DIAGNOSIS — R3129 Other microscopic hematuria: Secondary | ICD-10-CM

## 2021-03-24 MED ORDER — IOHEXOL 350 MG/ML SOLN
100.0000 mL | Freq: Once | INTRAVENOUS | Status: AC | PRN
  Administered 2021-03-24: 100 mL via INTRAVENOUS

## 2021-03-31 ENCOUNTER — Encounter: Payer: Self-pay | Admitting: Urology

## 2021-03-31 ENCOUNTER — Other Ambulatory Visit: Payer: Self-pay

## 2021-03-31 ENCOUNTER — Ambulatory Visit (INDEPENDENT_AMBULATORY_CARE_PROVIDER_SITE_OTHER): Payer: Medicare Other | Admitting: Urology

## 2021-03-31 VITALS — BP 160/84 | HR 76

## 2021-03-31 DIAGNOSIS — R351 Nocturia: Secondary | ICD-10-CM | POA: Diagnosis not present

## 2021-03-31 DIAGNOSIS — R3129 Other microscopic hematuria: Secondary | ICD-10-CM

## 2021-03-31 DIAGNOSIS — N400 Enlarged prostate without lower urinary tract symptoms: Secondary | ICD-10-CM | POA: Diagnosis not present

## 2021-03-31 DIAGNOSIS — N3001 Acute cystitis with hematuria: Secondary | ICD-10-CM | POA: Diagnosis not present

## 2021-03-31 LAB — URINALYSIS, ROUTINE W REFLEX MICROSCOPIC
Bilirubin, UA: NEGATIVE
Glucose, UA: NEGATIVE
Ketones, UA: NEGATIVE
Nitrite, UA: NEGATIVE
Protein,UA: NEGATIVE
Specific Gravity, UA: 1.02 (ref 1.005–1.030)
Urobilinogen, Ur: 0.2 mg/dL (ref 0.2–1.0)
pH, UA: 5.5 (ref 5.0–7.5)

## 2021-03-31 LAB — MICROSCOPIC EXAMINATION: Renal Epithel, UA: NONE SEEN /hpf

## 2021-03-31 MED ORDER — SILODOSIN 8 MG PO CAPS: 8.0000 mg | ORAL_CAPSULE | Freq: Every day | ORAL | 3 refills | Status: DC

## 2021-03-31 MED ORDER — NITROFURANTOIN MONOHYD MACRO 100 MG PO CAPS: 100.0000 mg | ORAL_CAPSULE | Freq: Two times a day (BID) | ORAL | 0 refills | Status: DC

## 2021-03-31 NOTE — Progress Notes (Signed)
03/31/2021 11:32 AM   Ane Payment 23-Sep-1945 [id number]  Referring provider: [name], MD [address] [place],  [place] [address]  Followup BPh with nocturia and microhematuria   HPI: Mr Polak is a 75yo here for followup for BPH with nocturia. He was started on uroxatral 10mg  last visit and developed hives. He was then prescribed rapaflo but the patient not fill the prescriptions. IPSS 10 QOL 4. He developed worsening urinary frequency, urgency and suprapubic pressure 5 days ago. No hematuria but he does have intermittent mild dysuria. UA is concerning for infection.   CT hematuria protocol showed a thick bladder wall and prostate hyperplasia.    PMH: Past Medical History:  Diagnosis Date   Arthritis    ra, sees dr Sedonia Small   Coronary artery disease    saw dr zachery in [place] 20 yrs ago, released from cardiology   Diabetes mellitus without complication ([place])    type 2   Hypertension    Urethral stricture     Surgical History: Past Surgical History:  Procedure Laterality Date   BACK SURGERY     x 3 lower back   CARDIAC CATHETERIZATION  2000   small amt plaque relased from cardiology 20 yrs ago   [place] N/A 04/17/2019   Procedure: CYSTOSCOPY WITH URETHRAL DILATATION, CYSTOGRAM;  Surgeon: Ceasar Mons, MD;  Location: [facility]  Service: Urology;  Laterality: N/A;   EYE SURGERY Bilateral    cataracts   urethral stricture surgery  15 yrs ago    Home Medications:  Allergies as of 03/31/2021       Reactions   Ciprofloxacin    Oxycodone-acetaminophen Rash        Medication List        Accurate as of March 31, 2021 11:32 AM. If you have any questions, ask your nurse or doctor.          amLODipine 10 MG tablet Commonly known as: NORVASC Take 1 tablet (10 mg total) by mouth daily.   aspirin EC 81 MG tablet Take 81 mg by mouth daily. Swallow whole.    Cyanocobalamin 2500 MCG Chew Chew by mouth daily.   dicyclomine 20 MG tablet Commonly known as: BENTYL Take 1 tablet (20 mg total) by mouth 2 (two) times daily between meals as needed for spasms.   famotidine 20 MG tablet Commonly known as: PEPCID Take 1 tablet (20 mg total) by mouth 2 (two) times daily.   folic acid 1 MG tablet Commonly known as: FOLVITE Take 1 mg by mouth daily.   glipiZIDE 5 MG tablet Commonly known as: GLUCOTROL Take 1 tablet (5 mg total) by mouth 2 (two) times daily before a meal.   hydrocortisone 25 MG suppository Commonly known as: Anusol-HC Place 1 suppository (25 mg total) rectally 2 (two) times daily as needed for hemorrhoids or anal itching.   losartan-hydrochlorothiazide 100-25 MG tablet Commonly known as: HYZAAR Take 1 tablet by mouth once daily   methocarbamol 500 MG tablet Commonly known as: Robaxin Take 1 tablet (500 mg total) by mouth every 6 (six) hours as needed for muscle spasms.   methotrexate 2.5 MG tablet Commonly known as: RHEUMATREX Caution:Chemotherapy. Protect from light. Take 5 tablets PO once weekly.   metoprolol succinate 50 MG 24 hr tablet Commonly known as: TOPROL-XL Take 50 mg by mouth 2 (two) times daily. Take with or immediately following a meal.  omeprazole 20 MG capsule Commonly known as: PRILOSEC Take 1 capsule (20 mg total) by mouth daily.   ondansetron 4 MG tablet Commonly known as: ZOFRAN Take 1 tablet (4 mg total) by mouth every 6 (six) hours.   predniSONE 2.5 MG tablet Commonly known as: DELTASONE Take 2.5 mg by mouth daily with breakfast.   rosuvastatin 10 MG tablet Commonly known as: Crestor Take 1 tablet (10 mg total) by mouth daily.   silodosin 8 MG Caps capsule Commonly known as: RAPAFLO Take 1 capsule (8 mg total) by mouth daily with breakfast.   triamcinolone cream 0.1 % Commonly known as: KENALOG Apply 1 application topically 2 (two) times daily.        Allergies:  Allergies   Allergen Reactions   Ciprofloxacin    Oxycodone-Acetaminophen Rash    Family History: No family history on file.  Social History:  reports that he has never smoked. He has never used smokeless tobacco. He reports that he does not drink alcohol and does not use drugs.  ROS: All other review of systems were reviewed and are negative except what is noted above in HPI  Physical Exam: There were no vitals taken for this visit.  Constitutional:  Alert and oriented, No acute distress. HEENT: [place] AT, moist mucus membranes.  Trachea midline, no masses. Cardiovascular: No clubbing, cyanosis, or edema. Respiratory: Normal respiratory effort, no increased work of breathing. GI: Abdomen is soft, nontender, nondistended, no abdominal masses GU: No CVA tenderness.  Lymph: No cervical or inguinal lymphadenopathy. Skin: No rashes, bruises or suspicious lesions. Neurologic: Grossly intact, no focal deficits, moving all 4 extremities. Psychiatric: Normal mood and affect.  Laboratory Data: Lab Results  Component Value Date   WBC 8.1 12/28/2020   HGB 13.5 12/28/2020   HCT 40.8 12/28/2020   MCV 88 12/28/2020   PLT 208 12/28/2020    Lab Results  Component Value Date   CREATININE 1.36 (H) 02/24/2021    No results found for: PSA  No results found for: TESTOSTERONE  Lab Results  Component Value Date   HGBA1C 6.9 (H) 02/24/2021    Urinalysis    Component Value Date/Time   COLORURINE yellow 04/16/2007 0000   APPEARANCEUR Clear 02/03/2021 1347   LABSPEC 1.020 04/16/2007 0000   PHURINE 5.5 04/16/2007 0000   GLUCOSEU Negative 02/03/2021 1347   HGBUR negative 04/16/2007 0000   BILIRUBINUR Negative 02/03/2021 1347   KETONESUR negative 12/28/2020 1532   PROTEINUR Negative 02/03/2021 1347   UROBILINOGEN 0.2 12/28/2020 1532   UROBILINOGEN negative 04/16/2007 0000   NITRITE Negative 02/03/2021 1347   NITRITE negative 04/16/2007 0000   LEUKOCYTESUR Negative 02/03/2021 1347    Lab  Results  Component Value Date   LABMICR See below: 02/03/2021   WBCUA 0-5 02/03/2021   LABEPIT 0-10 02/03/2021   MUCUS Present 02/03/2021   BACTERIA None seen 02/03/2021    Pertinent Imaging:  No results found for this or any previous visit.  No results found for this or any previous visit.  No results found for this or any previous visit.  No results found for this or any previous visit.  Results for orders placed during the hospital encounter of 12/17/20  US Renal  Narrative CLINICAL DATA:  Recurrent UTI in a 75 year old male.  EXAM: RENAL / URINARY TRACT ULTRASOUND COMPLETE  COMPARISON:  More remote studies dating back to 2005.  FINDINGS: Right Kidney:  Renal measurements: 9.5 x 4.0 x 5.1 cm = volume: 100.1 mL. Limited exam due to  body habitus, no hydronephrosis. Mild parenchymal thinning.  Left Kidney:  Renal measurements: 9.8 x 5.2 x 5.2 cm = volume: 138.8 mL. Limited exam due to overlying bowel gas and body habitus. No hydronephrosis. Mild parenchymal thinning. Small cyst arising from the lower pole measuring 1.2 x 1.0 x 1.0 cm.  Bladder:  Appears normal for degree of bladder distention.  Other:  Increased hepatic echogenicity.  IMPRESSION: Mild cortical thinning without signs of hydronephrosis. Limited exam due to body habitus and bowel gas.  Small LEFT lower pole cyst.  Hepatic steatosis.   Electronically Signed By: Zetta Bills M.D. On: 12/18/2020 14:12  No results found for this or any previous visit.  Results for orders placed during the hospital encounter of 03/24/21  CT HEMATURIA WORKUP  Narrative CLINICAL DATA:  Microhematuria  EXAM: CT ABDOMEN AND PELVIS WITHOUT AND WITH CONTRAST  TECHNIQUE: Multidetector CT imaging of the abdomen and pelvis was performed following the standard protocol before and following the bolus administration of intravenous contrast.  CONTRAST:  114mL OMNIPAQUE IOHEXOL 350 MG/ML  SOLN  COMPARISON:  None.  FINDINGS: Lower chest: No acute abnormality. Bandlike scarring of the bilateral lung bases.  Hepatobiliary: No focal liver abnormality is seen. Status post cholecystectomy. No biliary dilatation.  Pancreas: Unremarkable. No pancreatic ductal dilatation or surrounding inflammatory changes.  Spleen: Normal in size without significant abnormality.  Adrenals/Urinary Tract: Adrenal glands are unremarkable. Occasional small bilateral renal cysts. Kidneys are otherwise normal, without renal calculi, solid lesion, or hydronephrosis. No evidence of urinary tract filling defect on delayed phase imaging. Thickening of the urinary bladder wall. Diverticulum of the anterior bladder dome (series 7, image 74, series 12, image 69).  Stomach/Bowel: Stomach is within normal limits. Appendix appears normal. No evidence of bowel wall thickening, distention, or inflammatory changes.  Vascular/Lymphatic: Aortic atherosclerosis. No enlarged abdominal or pelvic lymph nodes.  Reproductive: Prostatomegaly with median lobe hypertrophy.  Other: No abdominal wall hernia or abnormality. No abdominopelvic ascites.  Musculoskeletal: No acute or significant osseous findings.  IMPRESSION: 1. No evidence of urinary tract calculus, mass, or hydronephrosis. No evidence of urinary tract filling defect on delayed phase imaging. 2. Thickening of the urinary bladder wall, likely related to chronic outlet obstruction although infectious or inflammatory cystitis is a differential consideration in the setting of hematuria. 3. Prostatomegaly.  Aortic Atherosclerosis (ICD10-I70.0).   Electronically Signed By: Delanna Ahmadi M.D. On: 03/26/2021 10:13  No results found for this or any previous visit.     Assessment & Plan:    1. Benign prostatic hyperplasia, unspecified whether lower urinary tract symptoms present -we will start rapaflo 8mg  qhs  2. Microhematuria -RTc 1 week  for cystoscopy  3. Nocturia -rapaflo 8mg  qhs  4. Acute cystitis -Urine for culture -macrobid 100mg  BID for 7 days   No follow-ups on file.  Nicolette Bang, MD  [facility] [place]

## 2021-03-31 NOTE — Addendum Note (Signed)
Addended by: Tyrone Apple on: 03/31/2021 03:07 PM   Modules accepted: Orders

## 2021-03-31 NOTE — Progress Notes (Signed)
Urological Symptom Review  Patient is experiencing the following symptoms: Get up at night to urinate Stream starts and stops   Review of Systems  Gastrointestinal (upper)  : Negative for upper GI symptoms  Gastrointestinal (lower) : Negative for lower GI symptoms  Constitutional : Negative for symptoms  Skin: Negative for skin symptoms  Eyes: Negative for eye symptoms  Ear/Nose/Throat : Negative for Ear/Nose/Throat symptoms  Hematologic/Lymphatic: Easy bruising  Cardiovascular : Negative for cardiovascular symptoms  Respiratory : Negative for respiratory symptoms  Endocrine: Negative for endocrine symptoms  Musculoskeletal: Back pain Joint pain  Neurological: Negative for neurological symptoms  Psychologic: Negative for psychiatric symptoms

## 2021-03-31 NOTE — Patient Instructions (Signed)
Urinary Tract Infection, Adult °A urinary tract infection (UTI) is an infection of any part of the urinary tract. The urinary tract includes: °The kidneys. °The ureters. °The bladder. °The urethra. °These organs make, store, and get rid of pee (urine) in the body. °What are the causes? °This infection is caused by germs (bacteria) in your genital area. These germs grow and cause swelling (inflammation) of your urinary tract. °What increases the risk? °The following factors may make you more likely to develop this condition: °Using a small, thin tube (catheter) to drain pee. °Not being able to control when you pee or poop (incontinence). °Being male. If you are male, these things can increase the risk: °Using these methods to prevent pregnancy: °A medicine that kills sperm (spermicide). °A device that blocks sperm (diaphragm). °Having low levels of a male hormone (estrogen). °Being pregnant. °You are more likely to develop this condition if: °You have genes that add to your risk. °You are sexually active. °You take antibiotic medicines. °You have trouble peeing because of: °A prostate that is bigger than normal, if you are male. °A blockage in the part of your body that drains pee from the bladder. °A kidney stone. °A nerve condition that affects your bladder. °Not getting enough to drink. °Not peeing often enough. °You have other conditions, such as: °Diabetes. °A weak disease-fighting system (immune system). °Sickle cell disease. °Gout. °Injury of the spine. °What are the signs or symptoms? °Symptoms of this condition include: °Needing to pee right away. °Peeing small amounts often. °Pain or burning when peeing. °Blood in the pee. °Pee that smells bad or not like normal. °Trouble peeing. °Pee that is cloudy. °Fluid coming from the vagina, if you are male. °Pain in the belly or lower back. °Other symptoms include: °Vomiting. °Not feeling hungry. °Feeling mixed up (confused). This may be the first symptom in  older adults. °Being tired and grouchy (irritable). °A fever. °Watery poop (diarrhea). °How is this treated? °Taking antibiotic medicine. °Taking other medicines. °Drinking enough water. °In some cases, you may need to see a specialist. °Follow these instructions at home: °Medicines °Take over-the-counter and prescription medicines only as told by your doctor. °If you were prescribed an antibiotic medicine, take it as told by your doctor. Do not stop taking it even if you start to feel better. °General instructions °Make sure you: °Pee until your bladder is empty. °Do not hold pee for a long time. °Empty your bladder after sex. °Wipe from front to back after peeing or pooping if you are a male. Use each tissue one time when you wipe. °Drink enough fluid to keep your pee pale yellow. °Keep all follow-up visits. °Contact a doctor if: °You do not get better after 1-2 days. °Your symptoms go away and then come back. °Get help right away if: °You have very bad back pain. °You have very bad pain in your lower belly. °You have a fever. °You have chills. °You feeling like you will vomit or you vomit. °Summary °A urinary tract infection (UTI) is an infection of any part of the urinary tract. °This condition is caused by germs in your genital area. °There are many risk factors for a UTI. °Treatment includes antibiotic medicines. °Drink enough fluid to keep your pee pale yellow. °This information is not intended to replace advice given to you by your health care provider. Make sure you discuss any questions you have with your health care provider. °Document Revised: 11/29/2019 Document Reviewed: 11/29/2019 °Elsevier Patient Education © 2022   [employer]. ° °

## 2021-04-02 LAB — URINE CULTURE

## 2021-04-02 NOTE — Progress Notes (Signed)
Results printed and mailed.   

## 2021-04-06 ENCOUNTER — Ambulatory Visit (INDEPENDENT_AMBULATORY_CARE_PROVIDER_SITE_OTHER): Payer: Medicare Other | Admitting: Podiatry

## 2021-04-06 ENCOUNTER — Other Ambulatory Visit: Payer: Self-pay

## 2021-04-06 DIAGNOSIS — M722 Plantar fascial fibromatosis: Secondary | ICD-10-CM | POA: Diagnosis not present

## 2021-04-06 DIAGNOSIS — M792 Neuralgia and neuritis, unspecified: Secondary | ICD-10-CM | POA: Diagnosis not present

## 2021-04-06 MED ORDER — LIDOCAINE 5 % EX OINT: 1.0000 "application " | TOPICAL_OINTMENT | CUTANEOUS | 0 refills | Status: DC | PRN

## 2021-04-07 ENCOUNTER — Ambulatory Visit (INDEPENDENT_AMBULATORY_CARE_PROVIDER_SITE_OTHER): Payer: Medicare Other | Admitting: Urology

## 2021-04-07 ENCOUNTER — Encounter: Payer: Self-pay | Admitting: Urology

## 2021-04-07 VITALS — BP 162/81 | HR 69

## 2021-04-07 DIAGNOSIS — R351 Nocturia: Secondary | ICD-10-CM

## 2021-04-07 DIAGNOSIS — N3001 Acute cystitis with hematuria: Secondary | ICD-10-CM

## 2021-04-07 DIAGNOSIS — N400 Enlarged prostate without lower urinary tract symptoms: Secondary | ICD-10-CM

## 2021-04-07 LAB — MICROSCOPIC EXAMINATION
Bacteria, UA: NONE SEEN
Renal Epithel, UA: NONE SEEN /hpf

## 2021-04-07 LAB — URINALYSIS, ROUTINE W REFLEX MICROSCOPIC
Bilirubin, UA: NEGATIVE
Glucose, UA: NEGATIVE
Ketones, UA: NEGATIVE
Leukocytes,UA: NEGATIVE
Nitrite, UA: NEGATIVE
Protein,UA: NEGATIVE
Specific Gravity, UA: 1.015 (ref 1.005–1.030)
Urobilinogen, Ur: 0.2 mg/dL (ref 0.2–1.0)
pH, UA: 5.5 (ref 5.0–7.5)

## 2021-04-07 MED ORDER — SULFAMETHOXAZOLE-TRIMETHOPRIM 800-160 MG PO TABS: 1.0000 | ORAL_TABLET | Freq: Two times a day (BID) | ORAL | 0 refills | Status: DC

## 2021-04-07 MED ORDER — CIPROFLOXACIN HCL 500 MG PO TABS: 500.0000 mg | ORAL_TABLET | Freq: Once | ORAL | Status: DC

## 2021-04-07 NOTE — Progress Notes (Signed)

## 2021-04-07 NOTE — Patient Instructions (Signed)

## 2021-04-07 NOTE — Progress Notes (Signed)
Simple Catheter Placement  Due to urethral stricture a foley cath was placed.  Patient was cleaned and prepped in a sterile fashion with betadine. A 16 FR foley catheter was inserted, urine return was noted  276ml, urine was blood tinge in color.  The balloon was filled with 10cc of sterile water.  A leg bag was attached for drainage. Patient was also given a night bag to take home and was given instruction on how to change from one bag to another.  Patient was given instruction on proper catheter care.   Performed by: Demarqus Jocson LPN  Additional notes/ Follow up: Per MD note

## 2021-04-08 ENCOUNTER — Telehealth: Payer: Self-pay

## 2021-04-08 NOTE — Telephone Encounter (Signed)
Patient called advising the medication sent in yesterday he is unable to take. Patient advised he is unable to take anything with suylfate in it.    Medication: sulfamethoxazole-trimethoprim (BACTRIM DS) 800-160 MG tablet  Pharmacy: [facility] in [place]

## 2021-04-08 NOTE — Telephone Encounter (Signed)
Returned call to patient.  Patient states he believes he took Sulfa in the past and it gave him diarrhea.  Patient states he had no other reaction to Sulfa.  Informed patient that antibiotic can cause diarrhea, take medication with food, and take OTC imodium if diarrhea occurs. Patient voiced understanding.

## 2021-04-12 ENCOUNTER — Ambulatory Visit (INDEPENDENT_AMBULATORY_CARE_PROVIDER_SITE_OTHER): Payer: Medicare Other | Admitting: Urology

## 2021-04-12 ENCOUNTER — Other Ambulatory Visit: Payer: Self-pay

## 2021-04-12 VITALS — BP 122/76 | HR 75

## 2021-04-12 DIAGNOSIS — N400 Enlarged prostate without lower urinary tract symptoms: Secondary | ICD-10-CM | POA: Diagnosis not present

## 2021-04-12 NOTE — Progress Notes (Signed)
04/12/2021 10:04 AM   Jonathan [name] 08-06-1945 [id number]  Referring provider: [name], MD [address] [place],  [place] [address]  Followup urethral stricture   HPI: 03/31/21 Jonathan [name] is a 75yo here for followup for BPH with nocturia. He was started on uroxatral 10mg  last visit and developed hives. He was then prescribed rapaflo but the patient not fill the prescriptions. IPSS 10 QOL 4. He developed worsening urinary frequency, urgency and suprapubic pressure 5 days ago. No hematuria but he does have intermittent mild dysuria. UA is concerning for infection.   CT hematuria protocol showed a thick bladder wall and prostate hyperplasia.  04/07/21 Pt unable to void post removal of catheter. Cysto and urethral dilation performed in office and indwelling foley placed. Pt DC on Bactrim and advised to FU on 12/12 for voiding trial, flow/PVR and UA. 04/12/21 Pt returns for fu post cysto, urethral dilatation and foley placement on 12/7. Pt has tolerated foley well. 130ml instilled and    PMH: Past Medical History:  Diagnosis Date   Arthritis    ra, sees dr Sedonia Small   Coronary artery disease    saw dr zachery in [place] 20 yrs ago, released from cardiology   Diabetes mellitus without complication ([place])    type 2   Hypertension    Urethral stricture     Surgical History: Past Surgical History:  Procedure Laterality Date   BACK SURGERY     x 3 lower back   CARDIAC CATHETERIZATION  2000   small amt plaque relased from cardiology 20 yrs ago   [place] N/A 04/17/2019   Procedure: CYSTOSCOPY WITH URETHRAL DILATATION, CYSTOGRAM;  Surgeon: Ceasar Mons, MD;  Location: [facility];  Service: Urology;  Laterality: N/A;   EYE SURGERY Bilateral    cataracts   urethral stricture surgery  15 yrs ago    Home Medications:  Allergies as of 04/12/2021       Reactions   Ciprofloxacin     Oxycodone-acetaminophen Rash        Medication List        Accurate as of April 12, 2021 10:04 AM. If you have any questions, ask your nurse or doctor.          STOP taking these medications    tamsulosin 0.4 MG Caps capsule Commonly known as: FLOMAX Stopped by: Nicolette Bang, MD       TAKE these medications    amLODipine 10 MG tablet Commonly known as: NORVASC Take 1 tablet (10 mg total) by mouth daily.   aspirin EC 81 MG tablet Take 81 mg by mouth daily. Swallow whole.   Cyanocobalamin 2500 MCG Chew Chew by mouth daily.   dicyclomine 20 MG tablet Commonly known as: BENTYL Take 1 tablet (20 mg total) by mouth 2 (two) times daily between meals as needed for spasms.   famotidine 20 MG tablet Commonly known as: PEPCID Take 1 tablet (20 mg total) by mouth 2 (two) times daily.   folic acid 1 MG tablet Commonly known as: FOLVITE Take 1 mg by mouth daily.   glipiZIDE 5 MG tablet Commonly known as: GLUCOTROL Take 1 tablet (5 mg total) by mouth 2 (two) times daily before a meal.   hydrocortisone 25 MG suppository Commonly known as: Anusol-HC Place 1 suppository (25 mg total) rectally 2 (two) times daily as needed for hemorrhoids or anal itching.   lidocaine 5 %  ointment Commonly known as: XYLOCAINE Apply 1 application topically as needed.   losartan-hydrochlorothiazide 100-25 MG tablet Commonly known as: HYZAAR Take 1 tablet by mouth once daily   methocarbamol 500 MG tablet Commonly known as: Robaxin Take 1 tablet (500 mg total) by mouth every 6 (six) hours as needed for muscle spasms.   methotrexate 2.5 MG tablet Commonly known as: RHEUMATREX Caution:Chemotherapy. Protect from light. Take 5 tablets PO once weekly.   metoprolol succinate 50 MG 24 hr tablet Commonly known as: TOPROL-XL Take 50 mg by mouth 2 (two) times daily. Take with or immediately following a meal.   nitrofurantoin (macrocrystal-monohydrate) 100 MG capsule Commonly known  as: MACROBID Take 1 capsule (100 mg total) by mouth every 12 (twelve) hours.   omeprazole 20 MG capsule Commonly known as: PRILOSEC Take 1 capsule (20 mg total) by mouth daily.   ondansetron 4 MG tablet Commonly known as: ZOFRAN Take 1 tablet (4 mg total) by mouth every 6 (six) hours.   predniSONE 2.5 MG tablet Commonly known as: DELTASONE Take 2.5 mg by mouth daily with breakfast.   rosuvastatin 10 MG tablet Commonly known as: Crestor Take 1 tablet (10 mg total) by mouth daily.   silodosin 8 MG Caps capsule Commonly known as: RAPAFLO Take 1 capsule (8 mg total) by mouth at bedtime.   sulfamethoxazole-trimethoprim 800-160 MG tablet Commonly known as: BACTRIM DS Take 1 tablet by mouth every 12 (twelve) hours.   triamcinolone cream 0.1 % Commonly known as: KENALOG Apply 1 application topically 2 (two) times daily.        Allergies:  Allergies  Allergen Reactions   Ciprofloxacin    Oxycodone-Acetaminophen Rash    Family History: No family history on file.  Social History:  reports that he has never smoked. He has never used smokeless tobacco. He reports that he does not drink alcohol and does not use drugs.  ROS: All other review of systems were reviewed and are negative except what is noted above in HPI  Physical Exam: BP 122/76   Pulse 75   Constitutional:  Alert and oriented, No acute distress. HEENT: [place] AT, moist mucus membranes.  Trachea midline, no masses. Cardiovascular: No clubbing, cyanosis, or edema. Respiratory: Normal respiratory effort, no increased work of breathing. GI: Abdomen is soft, nontender, nondistended, no abdominal masses GU: No CVA tenderness. Circumcised phallus. No masses/lesions on penis, testis, scrotum. Prostate 40g smooth no nodules no induration.  Lymph: No cervical or inguinal lymphadenopathy. Skin: No rashes, bruises or suspicious lesions. Neurologic: Grossly intact, no focal deficits, moving all 4 extremities. Psychiatric:  Normal mood and affect.  Laboratory Data: Lab Results  Component Value Date   WBC 8.1 12/28/2020   HGB 13.5 12/28/2020   HCT 40.8 12/28/2020   MCV 88 12/28/2020   PLT 208 12/28/2020    Lab Results  Component Value Date   CREATININE 1.36 (H) 02/24/2021    No results found for: PSA  No results found for: TESTOSTERONE  Lab Results  Component Value Date   HGBA1C 6.9 (H) 02/24/2021    Urinalysis    Component Value Date/Time   COLORURINE yellow 04/16/2007 0000   APPEARANCEUR Clear 04/07/2021 1521   LABSPEC 1.020 04/16/2007 0000   PHURINE 5.5 04/16/2007 0000   GLUCOSEU Negative 04/07/2021 1521   HGBUR negative 04/16/2007 0000   BILIRUBINUR Negative 04/07/2021 1521   KETONESUR negative 12/28/2020 1532   PROTEINUR Negative 04/07/2021 1521   UROBILINOGEN 0.2 12/28/2020 1532   UROBILINOGEN negative 04/16/2007 0000  NITRITE Negative 04/07/2021 1521   NITRITE negative 04/16/2007 0000   LEUKOCYTESUR Negative 04/07/2021 1521    Lab Results  Component Value Date   LABMICR See below: 04/07/2021   WBCUA 0-5 04/07/2021   LABEPIT 0-10 04/07/2021   MUCUS Present 04/07/2021   BACTERIA None seen 04/07/2021    Pertinent Imaging:  No results found for this or any previous visit.  No results found for this or any previous visit.  No results found for this or any previous visit.  No results found for this or any previous visit.  Results for orders placed during the hospital encounter of 12/17/20  US Renal  Narrative CLINICAL DATA:  Recurrent UTI in a 75 year old male.  EXAM: RENAL / URINARY TRACT ULTRASOUND COMPLETE  COMPARISON:  More remote studies dating back to 2005.  FINDINGS: Right Kidney:  Renal measurements: 9.5 x 4.0 x 5.1 cm = volume: 100.1 mL. Limited exam due to body habitus, no hydronephrosis. Mild parenchymal thinning.  Left Kidney:  Renal measurements: 9.8 x 5.2 x 5.2 cm = volume: 138.8 mL. Limited exam due to overlying bowel gas and body  habitus. No hydronephrosis. Mild parenchymal thinning. Small cyst arising from the lower pole measuring 1.2 x 1.0 x 1.0 cm.  Bladder:  Appears normal for degree of bladder distention.  Other:  Increased hepatic echogenicity.  IMPRESSION: Mild cortical thinning without signs of hydronephrosis. Limited exam due to body habitus and bowel gas.  Small LEFT lower pole cyst.  Hepatic steatosis.   Electronically Signed By: Zetta Bills M.D. On: 12/18/2020 14:12  No results found for this or any previous visit.  Results for orders placed during the hospital encounter of 03/24/21  CT HEMATURIA WORKUP  Narrative CLINICAL DATA:  Microhematuria  EXAM: CT ABDOMEN AND PELVIS WITHOUT AND WITH CONTRAST  TECHNIQUE: Multidetector CT imaging of the abdomen and pelvis was performed following the standard protocol before and following the bolus administration of intravenous contrast.  CONTRAST:  141mL OMNIPAQUE IOHEXOL 350 MG/ML SOLN  COMPARISON:  None.  FINDINGS: Lower chest: No acute abnormality. Bandlike scarring of the bilateral lung bases.  Hepatobiliary: No focal liver abnormality is seen. Status post cholecystectomy. No biliary dilatation.  Pancreas: Unremarkable. No pancreatic ductal dilatation or surrounding inflammatory changes.  Spleen: Normal in size without significant abnormality.  Adrenals/Urinary Tract: Adrenal glands are unremarkable. Occasional small bilateral renal cysts. Kidneys are otherwise normal, without renal calculi, solid lesion, or hydronephrosis. No evidence of urinary tract filling defect on delayed phase imaging. Thickening of the urinary bladder wall. Diverticulum of the anterior bladder dome (series 7, image 74, series 12, image 69).  Stomach/Bowel: Stomach is within normal limits. Appendix appears normal. No evidence of bowel wall thickening, distention, or inflammatory changes.  Vascular/Lymphatic: Aortic atherosclerosis. No enlarged  abdominal or pelvic lymph nodes.  Reproductive: Prostatomegaly with median lobe hypertrophy.  Other: No abdominal wall hernia or abnormality. No abdominopelvic ascites.  Musculoskeletal: No acute or significant osseous findings.  IMPRESSION: 1. No evidence of urinary tract calculus, mass, or hydronephrosis. No evidence of urinary tract filling defect on delayed phase imaging. 2. Thickening of the urinary bladder wall, likely related to chronic outlet obstruction although infectious or inflammatory cystitis is a differential consideration in the setting of hematuria. 3. Prostatomegaly.  Aortic Atherosclerosis (ICD10-I70.0).   Electronically Signed By: Delanna Ahmadi M.D. On: 03/26/2021 10:13  No results found for this or any previous visit.   Assessment & Plan:    1. Benign prostatic hyperplasia, unspecified whether lower  urinary tract symptoms present -Voiding trial passed today. The patient will continue rapaflo 8mg  daily and followup in 1 month for a Flow PVR   No follow-ups on file.  [place], Berneice Heinrich, PA-C  [facility] [place]

## 2021-04-12 NOTE — Patient Instructions (Signed)
FU in one month for flow/PVR.

## 2021-04-12 NOTE — Progress Notes (Signed)
Urological Symptom Review  Patient is experiencing the following symptoms: Patient has catheter in   Review of Systems  Gastrointestinal (upper)  : Negative for upper GI symptoms  Gastrointestinal (lower) : Negative for lower GI symptoms  Constitutional : Negative for symptoms  Skin: Negative for skin symptoms  Eyes: Negative for eye symptoms  Ear/Nose/Throat : Negative for Ear/Nose/Throat symptoms  Hematologic/Lymphatic: Negative for Hematologic/Lymphatic symptoms  Cardiovascular : Negative for cardiovascular symptoms  Respiratory : Negative for respiratory symptoms  Endocrine: Negative for endocrine symptoms  Musculoskeletal: Negative for musculoskeletal symptoms  Neurological: Negative for neurological symptoms  Psychologic: Negative for psychiatric symptoms

## 2021-04-12 NOTE — Progress Notes (Signed)
Fill and Pull Catheter Removal  Patient is present today for a catheter removal.  Patient was cleaned and prepped in a sterile fashion 123ml of sterile water/ saline was instilled into the bladder when the patient felt the urge to urinate. 79ml of water was then drained from the balloon.  A 16FR foley cath was removed from the bladder no complications were noted .  Patient as then given some time to void on their own.  Patient can void  130ml on their own after some time.  Patient tolerated well.  Performed by: Estill Bamberg RN  Follow up/ Additional notes: to see MD

## 2021-04-13 ENCOUNTER — Ambulatory Visit (INDEPENDENT_AMBULATORY_CARE_PROVIDER_SITE_OTHER): Payer: Medicare Other

## 2021-04-13 ENCOUNTER — Encounter: Payer: Self-pay | Admitting: Urology

## 2021-04-13 ENCOUNTER — Telehealth: Payer: Self-pay

## 2021-04-13 DIAGNOSIS — N138 Other obstructive and reflux uropathy: Secondary | ICD-10-CM

## 2021-04-13 DIAGNOSIS — N401 Enlarged prostate with lower urinary tract symptoms: Secondary | ICD-10-CM

## 2021-04-13 DIAGNOSIS — R3911 Hesitancy of micturition: Secondary | ICD-10-CM

## 2021-04-13 DIAGNOSIS — N400 Enlarged prostate without lower urinary tract symptoms: Secondary | ICD-10-CM

## 2021-04-13 NOTE — Telephone Encounter (Signed)
Patient called stating he feels his output is not efficient due to his fluid intake. Patient states he feels no discomfort related to incomplete bladder emptying.  Patient states he gets the urgency to void and the urine comes out with hesitancy.  He feels something is blocking his urine flow. Patient placed in nurse schedule for PVR.

## 2021-04-13 NOTE — Progress Notes (Signed)
post void residual= 122  Message sent to MD concerning symptoms  Patient will keep next scheduled OV.

## 2021-04-13 NOTE — Telephone Encounter (Signed)
Pvr noted @ at 122  Patient states that when he gets the urge to go it takes a while for his urine stream to start and then it feels likes some lets loose and his flow is good.   Patient will keep scheduled f/u and advised to call office is symptoms worsen. Patient voiced understanding.

## 2021-04-13 NOTE — Progress Notes (Signed)
Subjective:  Patient ID: Jonathan [name], male    DOB: [birth year],  MRN: [id number]  Chief Complaint  Patient presents with   Plantar Fasciitis    PT stated that he is still having pain with his feet     75 y.o. male presents with the above complaint.  Patient presents for follow-up to left [place] fasciitis.  He states he is doing better.  The injection helped.  The bracing also help.  He has a secondary complaint of right fourth digit neuritis.  He is a diabetic.  He states that has been causing him some pain.  He would like to discuss treatment options for it.   Review of Systems: Negative except as noted in the HPI. Denies N/V/F/Ch.  Past Medical History:  Diagnosis Date   Arthritis    ra, sees dr Sedonia Small   Coronary artery disease    saw dr zachery in [place] 20 yrs ago, released from cardiology   Diabetes mellitus without complication ([place])    type 2   Hypertension    Urethral stricture     Current Outpatient Medications:    lidocaine (XYLOCAINE) 5 % ointment, Apply 1 application topically as needed., Disp: 35.44 g, Rfl: 0   amLODipine (NORVASC) 10 MG tablet, Take 1 tablet (10 mg total) by mouth daily., Disp: 30 tablet, Rfl: 0   aspirin EC 81 MG tablet, Take 81 mg by mouth daily. Swallow whole., Disp: , Rfl:    Cyanocobalamin 2500 MCG CHEW, Chew by mouth daily., Disp: , Rfl:    dicyclomine (BENTYL) 20 MG tablet, Take 1 tablet (20 mg total) by mouth 2 (two) times daily between meals as needed for spasms., Disp: 12 tablet, Rfl: 0   famotidine (PEPCID) 20 MG tablet, Take 1 tablet (20 mg total) by mouth 2 (two) times daily., Disp: 30 tablet, Rfl: 0   folic acid (FOLVITE) 1 MG tablet, Take 1 mg by mouth daily., Disp: , Rfl:    glipiZIDE (GLUCOTROL) 5 MG tablet, Take 1 tablet (5 mg total) by mouth 2 (two) times daily before a meal., Disp: 30 tablet, Rfl: 0   hydrocortisone (ANUSOL-HC) 25 MG suppository, Place 1 suppository (25 mg total) rectally 2 (two) times daily as  needed for hemorrhoids or anal itching., Disp: 24 suppository, Rfl: 0   losartan-hydrochlorothiazide (HYZAAR) 100-25 MG tablet, Take 1 tablet by mouth once daily, Disp: 90 tablet, Rfl: 0   methocarbamol (ROBAXIN) 500 MG tablet, Take 1 tablet (500 mg total) by mouth every 6 (six) hours as needed for muscle spasms., Disp: 30 tablet, Rfl: 0   methotrexate (RHEUMATREX) 2.5 MG tablet, Caution:Chemotherapy. Protect from light. Take 5 tablets PO once weekly., Disp: , Rfl:    metoprolol succinate (TOPROL-XL) 50 MG 24 hr tablet, Take 50 mg by mouth 2 (two) times daily. Take with or immediately following a meal., Disp: , Rfl:    nitrofurantoin, macrocrystal-monohydrate, (MACROBID) 100 MG capsule, Take 1 capsule (100 mg total) by mouth every 12 (twelve) hours., Disp: 14 capsule, Rfl: 0   omeprazole (PRILOSEC) 20 MG capsule, Take 1 capsule (20 mg total) by mouth daily., Disp: 30 capsule, Rfl: 0   ondansetron (ZOFRAN) 4 MG tablet, Take 1 tablet (4 mg total) by mouth every 6 (six) hours., Disp: 8 tablet, Rfl: 0   predniSONE (DELTASONE) 2.5 MG tablet, Take 2.5 mg by mouth daily with breakfast., Disp: , Rfl:    rosuvastatin (CRESTOR) 10 MG tablet, Take 1 tablet (10 mg total) by mouth daily.,  Disp: 90 tablet, Rfl: 1   silodosin (RAPAFLO) 8 MG CAPS capsule, Take 1 capsule (8 mg total) by mouth at bedtime., Disp: 30 capsule, Rfl: 3   sulfamethoxazole-trimethoprim (BACTRIM DS) 800-160 MG tablet, Take 1 tablet by mouth every 12 (twelve) hours., Disp: 6 tablet, Rfl: 0   triamcinolone cream (KENALOG) 0.1 %, Apply 1 application topically 2 (two) times daily., Disp: 30 g, Rfl: 0  Social History   Tobacco Use  Smoking Status Never  Smokeless Tobacco Never    Allergies  Allergen Reactions   Ciprofloxacin    Oxycodone-Acetaminophen Rash   Objective:  There were no vitals filed for this visit. There is no height or weight on file to calculate BMI. Constitutional Well developed. Well nourished.  Vascular Dorsalis  pedis pulses palpable bilaterally. Posterior tibial pulses palpable bilaterally. Capillary refill normal to all digits.  No cyanosis or clubbing noted. Pedal hair growth normal.  Neurologic Normal speech. Oriented to person, place, and time. Epicritic sensation to light touch grossly present bilaterally.  Mild neuritis noted to the right fourth digit.  Dermatologic Nails well groomed and normal in appearance. No open wounds. No skin lesions.  Orthopedic: Normal joint ROM without pain or crepitus bilaterally. No visible deformities. No further tender to palpation at the calcaneal tuber left. No pain with calcaneal squeeze left. Ankle ROM diminished range of motion left. Silf[place]old Test: positive left.   Radiographs: None  Assessment:   1. Neuritis   2. Plantar fasciitis of left foot     Plan:  Patient was evaluated and treated and all questions answered.  Right fourth digit neuritis -Patient is experiencing some sharp stabbing pain to the fourth toe that is likely due to diabetes in nature.  I discussed with the patient that he will benefit from lidocaine patch to take some of the pain off.  Patient states understanding would like to proceed with that.  If there is no improvement we can discuss further evaluation.  Plantar Fasciitis, left -Clinically pain much controlled with steroid injection plantar fascial brace.  At this time I discussed shoe gear modification and over-the-counter insoles such as power step.  Patient states understand will make adjustment.  If any foot and ankle issues arise which have asked him to come see me.  No follow-ups on file.

## 2021-04-13 NOTE — Progress Notes (Signed)
   04/13/21  CC: difficulty urinating and microhematuria   HPI: Jonathan [name] is a 75yo here for cystoscopy for microhematuria and difficulty urinating Blood pressure (!) 162/81, pulse 69. NED. A&Ox3.   No respiratory distress   Abd soft, NT, ND Normal phallus with bilateral descended testicles  Cystoscopy Procedure Note  Patient identification was confirmed, informed consent was obtained, and patient was prepped using Betadine solution.  Lidocaine jelly was administered per urethral meatus.     Pre-Procedure: - Inspection reveals a normal caliber ureteral meatus.  Procedure: The flexible cystoscope was introduced without difficulty - we noted a dense bulbar urethral stricture. A stiff wire was passed through the scope and into the bladder. We then proceeded to sequentially dilate the urethral stricture from 8 french to 20 french. - Enlarged prostate no median lobe - Normal bladder neck - Bilateral ureteral orifices identified - Bladder mucosa  reveals no ulcers, tumors, or lesions - No bladder stones - No trabeculation  Retroflexion shows no intravesical prostatic protrusion We then placed a 16 french foley after the procedure   Post-Procedure: - Patient tolerated the procedure well  Assessment/ Plan:   Return for followup monday for voiding trial and then 4 weeks with MD.  Nicolette Bang, MD

## 2021-05-12 ENCOUNTER — Other Ambulatory Visit: Payer: Self-pay

## 2021-05-12 ENCOUNTER — Encounter: Payer: Self-pay | Admitting: Urology

## 2021-05-12 ENCOUNTER — Ambulatory Visit (INDEPENDENT_AMBULATORY_CARE_PROVIDER_SITE_OTHER): Payer: Commercial Managed Care - HMO | Admitting: Urology

## 2021-05-12 VITALS — BP 149/82 | HR 85 | Wt 225.0 lb

## 2021-05-12 DIAGNOSIS — N400 Enlarged prostate without lower urinary tract symptoms: Secondary | ICD-10-CM | POA: Diagnosis not present

## 2021-05-12 DIAGNOSIS — R351 Nocturia: Secondary | ICD-10-CM

## 2021-05-12 DIAGNOSIS — N3001 Acute cystitis with hematuria: Secondary | ICD-10-CM

## 2021-05-12 LAB — BLADDER SCAN AMB NON-IMAGING: Scan Result: 206

## 2021-05-12 LAB — URINALYSIS, ROUTINE W REFLEX MICROSCOPIC
Bilirubin, UA: NEGATIVE
Ketones, UA: NEGATIVE
Leukocytes,UA: NEGATIVE
Nitrite, UA: NEGATIVE
Protein,UA: NEGATIVE
Specific Gravity, UA: 1.015 (ref 1.005–1.030)
Urobilinogen, Ur: 0.2 mg/dL (ref 0.2–1.0)
pH, UA: 5.5 (ref 5.0–7.5)

## 2021-05-12 LAB — MICROSCOPIC EXAMINATION
Bacteria, UA: NONE SEEN
Epithelial Cells (non renal): NONE SEEN /hpf (ref 0–10)
Renal Epithel, UA: NONE SEEN /hpf
WBC, UA: NONE SEEN /hpf (ref 0–5)

## 2021-05-12 MED ORDER — SILODOSIN 8 MG PO CAPS: 8.0000 mg | ORAL_CAPSULE | Freq: Every day | ORAL | 11 refills | Status: DC

## 2021-05-12 MED ORDER — FINASTERIDE 5 MG PO TABS: 5.0000 mg | ORAL_TABLET | Freq: Every day | ORAL | 3 refills | Status: DC

## 2021-05-12 NOTE — Progress Notes (Signed)
post void residual= 206

## 2021-05-12 NOTE — Progress Notes (Signed)
05/12/2021 11:37 AM   Ane Payment 1945-11-22 [id number]  Referring provider: [name], MD [address] [place],  [place] [address]  Followup BPH   HPI: Mr Newbury is a 76yo here for followqup for BPH with urinary retention and nocturia. PVR 206cc on rapaflo 8mg  daily. IPSS 18 QOL 4. Nocturia 3x. Urine stream fair. He has significant urinary hesitancy. No other complaints today   PMH: Past Medical History:  Diagnosis Date   Arthritis    ra, sees dr Sedonia Small   Coronary artery disease    saw dr zachery in [place] 20 yrs ago, released from cardiology   Diabetes mellitus without complication ([place])    type 2   Hypertension    Urethral stricture     Surgical History: Past Surgical History:  Procedure Laterality Date   BACK SURGERY     x 3 lower back   CARDIAC CATHETERIZATION  2000   small amt plaque relased from cardiology 20 yrs ago   [place] N/A 04/17/2019   Procedure: CYSTOSCOPY WITH URETHRAL DILATATION, CYSTOGRAM;  Surgeon: Ceasar Mons, MD;  Location: [facility];  Service: Urology;  Laterality: N/A;   EYE SURGERY Bilateral    cataracts   urethral stricture surgery  15 yrs ago    Home Medications:  Allergies as of 05/12/2021       Reactions   Ciprofloxacin    Oxycodone-acetaminophen Rash        Medication List        Accurate as of May 12, 2021 11:37 AM. If you have any questions, ask your nurse or doctor.          STOP taking these medications    hydrocortisone 25 MG suppository Commonly known as: Anusol-HC Stopped by: Nicolette Bang, MD   lidocaine 5 % ointment Commonly known as: XYLOCAINE Stopped by: Nicolette Bang, MD   methocarbamol 500 MG tablet Commonly known as: Robaxin Stopped by: Nicolette Bang, MD   sulfamethoxazole-trimethoprim 800-160 MG tablet Commonly known as: BACTRIM DS Stopped by: Nicolette Bang, MD   triamcinolone  cream 0.1 % Commonly known as: KENALOG Stopped by: Nicolette Bang, MD       TAKE these medications    amLODipine 10 MG tablet Commonly known as: NORVASC Take 1 tablet (10 mg total) by mouth daily.   aspirin EC 81 MG tablet Take 81 mg by mouth daily. Swallow whole.   Cyanocobalamin 2500 MCG Chew Chew by mouth daily.   dicyclomine 20 MG tablet Commonly known as: BENTYL Take 1 tablet (20 mg total) by mouth 2 (two) times daily between meals as needed for spasms.   famotidine 20 MG tablet Commonly known as: PEPCID Take 1 tablet (20 mg total) by mouth 2 (two) times daily.   folic acid 1 MG tablet Commonly known as: FOLVITE Take 1 mg by mouth daily.   glipiZIDE 5 MG tablet Commonly known as: GLUCOTROL Take 1 tablet (5 mg total) by mouth 2 (two) times daily before a meal.   losartan-hydrochlorothiazide 100-25 MG tablet Commonly known as: HYZAAR Take 1 tablet by mouth once daily   methotrexate 2.5 MG tablet Commonly known as: RHEUMATREX Caution:Chemotherapy. Protect from light. Take 5 tablets PO once weekly.   metoprolol succinate 50 MG 24 hr tablet Commonly known as: TOPROL-XL Take 50 mg by mouth 2 (two) times daily. Take with or immediately following a meal.   nitrofurantoin (macrocrystal-monohydrate) 100 MG capsule Commonly  known as: MACROBID Take 1 capsule (100 mg total) by mouth every 12 (twelve) hours.   omeprazole 20 MG capsule Commonly known as: PRILOSEC Take 1 capsule (20 mg total) by mouth daily.   ondansetron 4 MG tablet Commonly known as: ZOFRAN Take 1 tablet (4 mg total) by mouth every 6 (six) hours.   predniSONE 2.5 MG tablet Commonly known as: DELTASONE Take 2.5 mg by mouth daily with breakfast.   rosuvastatin 10 MG tablet Commonly known as: Crestor Take 1 tablet (10 mg total) by mouth daily.   silodosin 8 MG Caps capsule Commonly known as: RAPAFLO Take 1 capsule (8 mg total) by mouth at bedtime.        Allergies:  Allergies   Allergen Reactions   Ciprofloxacin    Oxycodone-Acetaminophen Rash    Family History: History reviewed. No pertinent family history.  Social History:  reports that he has never smoked. He has never used smokeless tobacco. He reports that he does not drink alcohol and does not use drugs.  ROS: All other review of systems were reviewed and are negative except what is noted above in HPI  Physical Exam: BP (!) 149/82    Pulse 85    Wt 225 lb (102.1 kg)    BMI 33.23 kg/m   Constitutional:  Alert and oriented, No acute distress. HEENT: [place] AT, moist mucus membranes.  Trachea midline, no masses. Cardiovascular: No clubbing, cyanosis, or edema. Respiratory: Normal respiratory effort, no increased work of breathing. GI: Abdomen is soft, nontender, nondistended, no abdominal masses GU: No CVA tenderness.  Lymph: No cervical or inguinal lymphadenopathy. Skin: No rashes, bruises or suspicious lesions. Neurologic: Grossly intact, no focal deficits, moving all 4 extremities. Psychiatric: Normal mood and affect.  Laboratory Data: Lab Results  Component Value Date   WBC 8.1 12/28/2020   HGB 13.5 12/28/2020   HCT 40.8 12/28/2020   MCV 88 12/28/2020   PLT 208 12/28/2020    Lab Results  Component Value Date   CREATININE 1.36 (H) 02/24/2021    No results found for: PSA  No results found for: TESTOSTERONE  Lab Results  Component Value Date   HGBA1C 6.9 (H) 02/24/2021    Urinalysis    Component Value Date/Time   COLORURINE yellow 04/16/2007 0000   APPEARANCEUR Clear 04/07/2021 1521   LABSPEC 1.020 04/16/2007 0000   PHURINE 5.5 04/16/2007 0000   GLUCOSEU Negative 04/07/2021 1521   HGBUR negative 04/16/2007 0000   BILIRUBINUR Negative 04/07/2021 1521   KETONESUR negative 12/28/2020 1532   PROTEINUR Negative 04/07/2021 1521   UROBILINOGEN 0.2 12/28/2020 1532   UROBILINOGEN negative 04/16/2007 0000   NITRITE Negative 04/07/2021 1521   NITRITE negative 04/16/2007 0000    LEUKOCYTESUR Negative 04/07/2021 1521    Lab Results  Component Value Date   LABMICR See below: 04/07/2021   WBCUA 0-5 04/07/2021   LABEPIT 0-10 04/07/2021   MUCUS Present 04/07/2021   BACTERIA None seen 04/07/2021    Pertinent Imaging:  No results found for this or any previous visit.  No results found for this or any previous visit.  No results found for this or any previous visit.  No results found for this or any previous visit.  Results for orders placed during the hospital encounter of 12/17/20  US Renal  Narrative CLINICAL DATA:  Recurrent UTI in a 76 year old male.  EXAM: RENAL / URINARY TRACT ULTRASOUND COMPLETE  COMPARISON:  More remote studies dating back to 2005.  FINDINGS: Right Kidney:  Renal measurements:  9.5 x 4.0 x 5.1 cm = volume: 100.1 mL. Limited exam due to body habitus, no hydronephrosis. Mild parenchymal thinning.  Left Kidney:  Renal measurements: 9.8 x 5.2 x 5.2 cm = volume: 138.8 mL. Limited exam due to overlying bowel gas and body habitus. No hydronephrosis. Mild parenchymal thinning. Small cyst arising from the lower pole measuring 1.2 x 1.0 x 1.0 cm.  Bladder:  Appears normal for degree of bladder distention.  Other:  Increased hepatic echogenicity.  IMPRESSION: Mild cortical thinning without signs of hydronephrosis. Limited exam due to body habitus and bowel gas.  Small LEFT lower pole cyst.  Hepatic steatosis.   Electronically Signed By: Zetta Bills M.D. On: 12/18/2020 14:12  No results found for this or any previous visit.  Results for orders placed during the hospital encounter of 03/24/21  CT HEMATURIA WORKUP  Narrative CLINICAL DATA:  Microhematuria  EXAM: CT ABDOMEN AND PELVIS WITHOUT AND WITH CONTRAST  TECHNIQUE: Multidetector CT imaging of the abdomen and pelvis was performed following the standard protocol before and following the bolus administration of intravenous contrast.  CONTRAST:   136mL OMNIPAQUE IOHEXOL 350 MG/ML SOLN  COMPARISON:  None.  FINDINGS: Lower chest: No acute abnormality. Bandlike scarring of the bilateral lung bases.  Hepatobiliary: No focal liver abnormality is seen. Status post cholecystectomy. No biliary dilatation.  Pancreas: Unremarkable. No pancreatic ductal dilatation or surrounding inflammatory changes.  Spleen: Normal in size without significant abnormality.  Adrenals/Urinary Tract: Adrenal glands are unremarkable. Occasional small bilateral renal cysts. Kidneys are otherwise normal, without renal calculi, solid lesion, or hydronephrosis. No evidence of urinary tract filling defect on delayed phase imaging. Thickening of the urinary bladder wall. Diverticulum of the anterior bladder dome (series 7, image 74, series 12, image 69).  Stomach/Bowel: Stomach is within normal limits. Appendix appears normal. No evidence of bowel wall thickening, distention, or inflammatory changes.  Vascular/Lymphatic: Aortic atherosclerosis. No enlarged abdominal or pelvic lymph nodes.  Reproductive: Prostatomegaly with median lobe hypertrophy.  Other: No abdominal wall hernia or abnormality. No abdominopelvic ascites.  Musculoskeletal: No acute or significant osseous findings.  IMPRESSION: 1. No evidence of urinary tract calculus, mass, or hydronephrosis. No evidence of urinary tract filling defect on delayed phase imaging. 2. Thickening of the urinary bladder wall, likely related to chronic outlet obstruction although infectious or inflammatory cystitis is a differential consideration in the setting of hematuria. 3. Prostatomegaly.  Aortic Atherosclerosis (ICD10-I70.0).   Electronically Signed By: Delanna Ahmadi M.D. On: 03/26/2021 10:13  No results found for this or any previous visit.   Assessment & Plan:    1. BPH with urinary retention, nocturia -Continue rapaflo 8mg  daily -Start finasteride 5mg  daily - Urinalysis, Routine w  reflex microscopic   No follow-ups on file.  Nicolette Bang, MD  [facility] [place]

## 2021-05-12 NOTE — Progress Notes (Signed)
Urological Symptom Review  Patient is experiencing the following symptoms: Urinary tract infection Weak stream   Review of Systems  Gastrointestinal (upper)  : Negative for upper GI symptoms  Gastrointestinal (lower) : Negative for lower GI symptoms  Constitutional : Negative for symptoms  Skin: Negative for skin symptoms  Eyes: Negative for eye symptoms  Ear/Nose/Throat : Sinus problems  Hematologic/Lymphatic: Negative for Hematologic/Lymphatic symptoms  Cardiovascular : Negative for cardiovascular symptoms  Respiratory : Negative for respiratory symptoms  Endocrine: Negative for endocrine symptoms  Musculoskeletal: Back pain Joint pain  Neurological: Negative for neurological symptoms  Psychologic: Negative for psychiatric symptoms

## 2021-06-03 ENCOUNTER — Ambulatory Visit (INDEPENDENT_AMBULATORY_CARE_PROVIDER_SITE_OTHER): Payer: Medicare Other | Admitting: Podiatry

## 2021-06-03 ENCOUNTER — Other Ambulatory Visit: Payer: Self-pay

## 2021-06-03 DIAGNOSIS — I999 Unspecified disorder of circulatory system: Secondary | ICD-10-CM | POA: Diagnosis not present

## 2021-06-03 DIAGNOSIS — E1121 Type 2 diabetes mellitus with diabetic nephropathy: Secondary | ICD-10-CM

## 2021-06-03 DIAGNOSIS — I739 Peripheral vascular disease, unspecified: Secondary | ICD-10-CM

## 2021-06-03 DIAGNOSIS — L539 Erythematous condition, unspecified: Secondary | ICD-10-CM

## 2021-06-03 NOTE — Progress Notes (Signed)
Subjective:  Patient ID: Jonathan [name], male    DOB: 04-26-1946,  MRN: [id number]  Chief Complaint  Patient presents with   Toe Pain    Bilateral toe pain  Pt stated that his toes are red and burn     76 y.o. male presents with the above complaint.  Patient presents with a new complaint of bilateral forefoot toes red and burning.  Patient states that he came out of nowhere.  He wanted to get it evaluated there is burning sensation to it.  He is a diabetic.  He denies seeing anyone else prior to seeing me for this.  He wants to make sure that there is no infection.  He also has severe neuropathy secondary to diabetes as well.  Denies any other acute complaints.  He does not have any vascular history that he is aware of.   Review of Systems: Negative except as noted in the HPI. Denies N/V/F/Ch.  Past Medical History:  Diagnosis Date   Arthritis    ra, sees dr Sedonia Small   Coronary artery disease    saw dr zachery in [place] 20 yrs ago, released from cardiology   Diabetes mellitus without complication ([place])    type 2   Hypertension    Urethral stricture     Current Outpatient Medications:    amLODipine (NORVASC) 10 MG tablet, Take 1 tablet (10 mg total) by mouth daily., Disp: 30 tablet, Rfl: 0   aspirin EC 81 MG tablet, Take 81 mg by mouth daily. Swallow whole., Disp: , Rfl:    Cyanocobalamin 2500 MCG CHEW, Chew by mouth daily., Disp: , Rfl:    dicyclomine (BENTYL) 20 MG tablet, Take 1 tablet (20 mg total) by mouth 2 (two) times daily between meals as needed for spasms., Disp: 12 tablet, Rfl: 0   famotidine (PEPCID) 20 MG tablet, Take 1 tablet (20 mg total) by mouth 2 (two) times daily., Disp: 30 tablet, Rfl: 0   finasteride (PROSCAR) 5 MG tablet, Take 1 tablet (5 mg total) by mouth daily., Disp: 90 tablet, Rfl: 3   folic acid (FOLVITE) 1 MG tablet, Take 1 mg by mouth daily., Disp: , Rfl:    glipiZIDE (GLUCOTROL) 5 MG tablet, Take 1 tablet (5 mg total) by mouth 2 (two) times  daily before a meal., Disp: 30 tablet, Rfl: 0   losartan-hydrochlorothiazide (HYZAAR) 100-25 MG tablet, Take 1 tablet by mouth once daily, Disp: 90 tablet, Rfl: 0   methotrexate (RHEUMATREX) 2.5 MG tablet, Caution:Chemotherapy. Protect from light. Take 5 tablets PO once weekly., Disp: , Rfl:    metoprolol succinate (TOPROL-XL) 50 MG 24 hr tablet, Take 50 mg by mouth 2 (two) times daily. Take with or immediately following a meal., Disp: , Rfl:    nitrofurantoin, macrocrystal-monohydrate, (MACROBID) 100 MG capsule, Take 1 capsule (100 mg total) by mouth every 12 (twelve) hours., Disp: 14 capsule, Rfl: 0   omeprazole (PRILOSEC) 20 MG capsule, Take 1 capsule (20 mg total) by mouth daily., Disp: 30 capsule, Rfl: 0   ondansetron (ZOFRAN) 4 MG tablet, Take 1 tablet (4 mg total) by mouth every 6 (six) hours., Disp: 8 tablet, Rfl: 0   predniSONE (DELTASONE) 2.5 MG tablet, Take 2.5 mg by mouth daily with breakfast., Disp: , Rfl:    rosuvastatin (CRESTOR) 10 MG tablet, Take 1 tablet (10 mg total) by mouth daily., Disp: 90 tablet, Rfl: 1   silodosin (RAPAFLO) 8 MG CAPS capsule, Take 1 capsule (8 mg total) by mouth  at bedtime., Disp: 30 capsule, Rfl: 11  Social History   Tobacco Use  Smoking Status Never  Smokeless Tobacco Never    Allergies  Allergen Reactions   Ciprofloxacin    Oxycodone-Acetaminophen Rash   Objective:  There were no vitals filed for this visit. There is no height or weight on file to calculate BMI. Constitutional Well developed. Well nourished.  Vascular Dorsalis pedis pulses non palpable bilaterally. Posterior tibial pulses non palpable bilaterally. Capillary refill diminished to all digits.  No cyanosis or clubbing noted. Pedal hair growth not present  Neurologic Normal speech. Oriented to person, place, and time. Epicritic sensation to light touch grossly present bilaterally.  Dermatologic No open wounds or lesions noted.  No ulcers noted.  No concern for infection  noted.  Patient's entire forefoot is demarcated with redness consistent with dependent rubor.  Orthopedic: Normal joint ROM without pain or crepitus bilaterally. No visible deformities. No bony tenderness.   Radiographs: None Assessment:   1. Vascular abnormality   2. Dependent rubor   3. Claudication [facility])    Plan:  Patient was evaluated and treated and all questions answered.  Bilateral forefoot dependent rubor with underlying vascular abnormality/claudication pain -All questions and concerns were discussed with the patient and his wife in extensive detail. -Given the nature of the pain without setting of the wound I believe patient would benefit from baseline ABI to assess the flow to both lower extremity.  Given that I am unable to appreciate his pulses I believe patient may have some sort of blockages in the arteries diminishing flow to both lower extremity.  I discussed this with patient in extensive detail he states understand like to proceed with getting ABIs. -If there is blockage in the artery I will refer him to a vascular doctor.  At this time there is no wound present.  I discussed with him if the wound is developed or if the redness gets worse go to the emergency room right away he states understanding.  I also do not believe this is infectious in origin as it is both forefoot without any wounds correlating the same way.  With same clinical information  No follow-ups on file.

## 2021-06-17 ENCOUNTER — Other Ambulatory Visit: Payer: Self-pay | Admitting: Podiatry

## 2021-06-17 DIAGNOSIS — I739 Peripheral vascular disease, unspecified: Secondary | ICD-10-CM

## 2021-06-28 ENCOUNTER — Other Ambulatory Visit: Payer: Self-pay

## 2021-06-28 ENCOUNTER — Ambulatory Visit (INDEPENDENT_AMBULATORY_CARE_PROVIDER_SITE_OTHER): Payer: Medicare Other | Admitting: Internal Medicine

## 2021-06-28 ENCOUNTER — Encounter (INDEPENDENT_AMBULATORY_CARE_PROVIDER_SITE_OTHER): Payer: Self-pay

## 2021-06-28 ENCOUNTER — Encounter: Payer: Self-pay | Admitting: Internal Medicine

## 2021-06-28 VITALS — BP 136/88 | HR 66 | Resp 16 | Ht 69.0 in | Wt 230.4 lb

## 2021-06-28 DIAGNOSIS — R3911 Hesitancy of micturition: Secondary | ICD-10-CM

## 2021-06-28 DIAGNOSIS — K219 Gastro-esophageal reflux disease without esophagitis: Secondary | ICD-10-CM

## 2021-06-28 DIAGNOSIS — M069 Rheumatoid arthritis, unspecified: Secondary | ICD-10-CM

## 2021-06-28 DIAGNOSIS — I10 Essential (primary) hypertension: Secondary | ICD-10-CM | POA: Diagnosis not present

## 2021-06-28 DIAGNOSIS — E1121 Type 2 diabetes mellitus with diabetic nephropathy: Secondary | ICD-10-CM | POA: Diagnosis not present

## 2021-06-28 DIAGNOSIS — Z1211 Encounter for screening for malignant neoplasm of colon: Secondary | ICD-10-CM

## 2021-06-28 DIAGNOSIS — N401 Enlarged prostate with lower urinary tract symptoms: Secondary | ICD-10-CM

## 2021-06-28 DIAGNOSIS — E038 Other specified hypothyroidism: Secondary | ICD-10-CM

## 2021-06-28 DIAGNOSIS — E785 Hyperlipidemia, unspecified: Secondary | ICD-10-CM

## 2021-06-28 DIAGNOSIS — E559 Vitamin D deficiency, unspecified: Secondary | ICD-10-CM

## 2021-06-28 LAB — POCT GLYCOSYLATED HEMOGLOBIN (HGB A1C): HbA1c, POC (controlled diabetic range): 7.3 % — AB (ref 0.0–7.0)

## 2021-06-28 NOTE — Progress Notes (Signed)
Established Patient Office Visit  Subjective:  Patient ID: Jonathan [name], male    DOB: April 01, 1946  Age: 76 y.o. MRN: [id number]  CC:  Chief Complaint  Patient presents with   Follow-up    4 month follow up HTN and DM     HPI Jonathan [name] is a 76 y.o. male with past medical history of HTN, DM, GERD, RA, CKD stage 3, BPH and obesity who presents for f/u of his chronic medical conditions.  HTN: BP is well-controlled. Takes medications regularly. Patient denies headache, dizziness, chest pain, dyspnea or palpitations.   Type II DM: His HbA1C was 7.3 today.  He has been taking glipizide once daily as his blood glucose was dropping below 70 when he took it twice daily.  His blood glucose has been running between 80-120 now with Glipizide 5 mg QD.  Denies any fatigue, polyuria or polydipsia.  He also has a history of CKD stage III.  He has started taking Crestor for HLD.  His dose of prednisone has been decreased recently, which he takes for RA.  He is placed on Rapaflo and finasteride for history of BPH.  He has had cystoscopy for ureteral stricture recently.  Of note, he reports only taking Rapaflo currently, but agrees to start taking finasteride as well.  Followed by Urology.  He still has urinary hesitance, but denies any dysuria or hematuria currently.    Past Medical History:  Diagnosis Date   Arthritis    ra, sees dr Sedonia Small   Coronary artery disease    saw dr zachery in [place] 20 yrs ago, released from cardiology   Diabetes mellitus without complication ([place])    type 2   Hypertension    Urethral stricture     Past Surgical History:  Procedure Laterality Date   BACK SURGERY     x 3 lower back   CARDIAC CATHETERIZATION  2000   small amt plaque relased from cardiology 20 yrs ago   [place] N/A 04/17/2019   Procedure: CYSTOSCOPY WITH URETHRAL DILATATION, CYSTOGRAM;  Surgeon: Ceasar Mons, MD;   Location: [facility];  Service: Urology;  Laterality: N/A;   EYE SURGERY Bilateral    cataracts   urethral stricture surgery  15 yrs ago    History reviewed. No pertinent family history.  Social History   Socioeconomic History   Marital status: Widowed    Spouse name: Not on file   Number of children: Not on file   Years of education: Not on file   Highest education level: Not on file  Occupational History   Not on file  Tobacco Use   Smoking status: Never   Smokeless tobacco: Never  Vaping Use   Vaping Use: Never used  Substance and Sexual Activity   Alcohol use: No   Drug use: No   Sexual activity: Not on file  Other Topics Concern   Not on file  Social History Narrative   Not on file   Social Determinants of Health   Financial Resource Strain: Low Risk    Difficulty of Paying Living Expenses: Not hard at all  Food Insecurity: No Food Insecurity   Worried About Running Out of Food in the Last Year: Never true   [place] in the Last Year: Never true  Transportation Needs: No Transportation Needs   Lack of Transportation (Medical): No   Lack of Transportation (Non-Medical): No  Physical Activity: Insufficiently Active   Days of Exercise per Week: 3 days   Minutes of Exercise per Session: 30 min  Stress: No Stress Concern Present   Feeling of Stress : Not at all  Social Connections: Moderately Integrated   Frequency of Communication with Friends and Family: More than three times a week   Frequency of Social Gatherings with Friends and Family: More than three times a week   Attends Religious Services: More than 4 times per year   Active Member of [employer] or Organizations: No   Attends Archivist Meetings: Never   Marital Status: Married  Human resources officer Violence: Not At Risk   Fear of Current or Ex-Partner: No   Emotionally Abused: No   Physically Abused: No   Sexually Abused: No    Outpatient Medications Prior to Visit   Medication Sig Dispense Refill   amLODipine (NORVASC) 10 MG tablet Take 1 tablet (10 mg total) by mouth daily. 30 tablet 0   aspirin EC 81 MG tablet Take 81 mg by mouth daily. Swallow whole.     Cyanocobalamin 2500 MCG CHEW Chew by mouth daily.     dicyclomine (BENTYL) 20 MG tablet Take 1 tablet (20 mg total) by mouth 2 (two) times daily between meals as needed for spasms. 12 tablet 0   famotidine (PEPCID) 20 MG tablet Take 1 tablet (20 mg total) by mouth 2 (two) times daily. 30 tablet 0   finasteride (PROSCAR) 5 MG tablet Take 1 tablet (5 mg total) by mouth daily. 90 tablet 3   folic acid (FOLVITE) 1 MG tablet Take 1 mg by mouth daily.     glipiZIDE (GLUCOTROL) 5 MG tablet Take 1 tablet (5 mg total) by mouth 2 (two) times daily before a meal. 30 tablet 0   losartan-hydrochlorothiazide (HYZAAR) 100-25 MG tablet Take 1 tablet by mouth once daily 90 tablet 0   methotrexate (RHEUMATREX) 2.5 MG tablet Caution:Chemotherapy. Protect from light. Take 5 tablets PO once weekly.     metoprolol succinate (TOPROL-XL) 50 MG 24 hr tablet Take 50 mg by mouth 2 (two) times daily. Take with or immediately following a meal.     nitrofurantoin, macrocrystal-monohydrate, (MACROBID) 100 MG capsule Take 1 capsule (100 mg total) by mouth every 12 (twelve) hours. 14 capsule 0   omeprazole (PRILOSEC) 20 MG capsule Take 1 capsule (20 mg total) by mouth daily. 30 capsule 0   ondansetron (ZOFRAN) 4 MG tablet Take 1 tablet (4 mg total) by mouth every 6 (six) hours. 8 tablet 0   predniSONE (DELTASONE) 1 MG tablet Take 1 mg by mouth daily with breakfast.     rosuvastatin (CRESTOR) 10 MG tablet Take 1 tablet (10 mg total) by mouth daily. 90 tablet 1   silodosin (RAPAFLO) 8 MG CAPS capsule Take 1 capsule (8 mg total) by mouth at bedtime. 30 capsule 11   predniSONE (DELTASONE) 2.5 MG tablet Take 2.5 mg by mouth daily with breakfast.     No facility-administered medications prior to visit.    Allergies  Allergen Reactions    Ciprofloxacin    Oxycodone-Acetaminophen Rash    ROS Review of Systems  Constitutional:  Negative for chills and fever.  HENT:  Negative for congestion and sore throat.   Eyes:  Negative for pain and discharge.  Respiratory:  Negative for cough and shortness of breath.   Cardiovascular:  Negative for chest pain and palpitations.  Gastrointestinal:  Negative for constipation, diarrhea, nausea and vomiting.  Endocrine: Negative  for polydipsia and polyuria.  Genitourinary:  Negative for dysuria and hematuria.  Musculoskeletal:  Positive for arthralgias and back pain. Negative for neck pain and neck stiffness.  Skin:  Negative for rash.  Neurological:  Negative for dizziness, weakness, numbness and headaches.  Psychiatric/Behavioral:  Negative for agitation and behavioral problems.      Objective:    Physical Exam Vitals reviewed.  Constitutional:      General: He is not in acute distress.    Appearance: He is obese. He is not diaphoretic.  HENT:     Head: Normocephalic and atraumatic.     Nose: Nose normal.     Mouth/Throat:     Mouth: Mucous membranes are moist.  Eyes:     General: No scleral icterus.    Extraocular Movements: Extraocular movements intact.  Cardiovascular:     Rate and Rhythm: Normal rate and regular rhythm.     Pulses: Normal pulses.     Heart sounds: Normal heart sounds. No murmur heard. Pulmonary:     Breath sounds: Normal breath sounds. No wheezing or rales.  Musculoskeletal:     Cervical back: Neck supple. No tenderness.     Right lower leg: No edema.     Left lower leg: No edema.  Skin:    General: Skin is warm.     Findings: No rash.  Neurological:     General: No focal deficit present.     Mental Status: He is alert and oriented to person, place, and time.     Sensory: No sensory deficit.     Motor: No weakness.  Psychiatric:        Mood and Affect: Mood normal.        Behavior: Behavior normal.    BP 136/88 (BP Location: Left Arm,  Patient Position: Sitting, Cuff Size: Normal)    Pulse 66    Resp 16    Ht 5' 9"  (1.753 m)    Wt 230 lb 6.4 oz (104.5 kg)    SpO2 100%    BMI 34.02 kg/m  Wt Readings from Last 3 Encounters:  06/28/21 230 lb 6.4 oz (104.5 kg)  05/12/21 225 lb (102.1 kg)  02/24/21 223 lb 1.9 oz (101.2 kg)    Lab Results  Component Value Date   TSH 5.170 (H) 02/24/2021   Lab Results  Component Value Date   WBC 8.1 12/28/2020   HGB 13.5 12/28/2020   HCT 40.8 12/28/2020   MCV 88 12/28/2020   PLT 208 12/28/2020   Lab Results  Component Value Date   NA 136 02/24/2021   K 4.2 02/24/2021   CO2 22 02/24/2021   GLUCOSE 157 (H) 02/24/2021   BUN 26 02/24/2021   CREATININE 1.36 (H) 02/24/2021   BILITOT 0.5 02/24/2021   ALKPHOS 87 02/24/2021   AST 31 02/24/2021   ALT 28 02/24/2021   PROT 7.7 02/24/2021   ALBUMIN 3.8 02/24/2021   CALCIUM 9.4 02/24/2021   EGFR 55 (L) 02/24/2021   Lab Results  Component Value Date   CHOL 217 (H) 10/23/2020   Lab Results  Component Value Date   HDL 53 10/23/2020   Lab Results  Component Value Date   LDLCALC 139 (H) 10/23/2020   Lab Results  Component Value Date   TRIG 140 10/23/2020   Lab Results  Component Value Date   CHOLHDL 4.1 10/23/2020   Lab Results  Component Value Date   HGBA1C 7.3 (A) 06/28/2021      Assessment &  Plan:   Problem List Items Addressed This Visit       Cardiovascular and Mediastinum   Essential hypertension    BP Readings from Last 1 Encounters:  06/28/21 136/88  Overall well-controlled with Amlodipine, Losartan-HCTZ and Metoprolol Counseled for compliance with the medications Advised DASH diet and moderate exercise/walking, at least 150 mins/week      Relevant Orders   CBC with Differential/Platelet     Digestive   GERD (gastroesophageal reflux disease)    Well controlled with omeprazole        Endocrine   Type II diabetes mellitus with nephropathy ([place]) - Primary    Lab Results  Component Value Date    HGBA1C 7.3 (A) 06/28/2021   On Glipizide 5 mg QD, was taking it twice daily, but had episode of hypoglycemia and now taking it QD Blood glucose at home has been around 80-120 Recently decreased dose of steroid as well, which should improve glycemic profile Advised to follow diabetic diet On ARB F/u CMP and HbA1C Diabetic eye exam: Advised to follow up with Ophthalmology for diabetic eye exam      Relevant Orders   Hemoglobin A1c   CMP14+EGFR   POCT glycosylated hemoglobin (Hb A1C) (Completed)   Subclinical hypothyroidism    Lab Results  Component Value Date   TSH 5.170 (H) 02/24/2021  Asymptomatic Check TSH and free T4      Relevant Orders   TSH + free T4     Musculoskeletal and Integument   Rheumatoid arthritis (HCC)    On Methotrexate and Prednisone F/u with Dr Amil Amen      Relevant Medications   predniSONE (DELTASONE) 1 MG tablet   Other Relevant Orders   CBC with Differential/Platelet     Genitourinary   BPH (benign prostatic hyperplasia)    On Rapaflo, needs to start taking Finasteride Followed by Urology        Other   Hyperlipidemia LDL goal <100    Lipid profile reviewed On Crestor 10 mg daily, needs to remain compliant      Other Visit Diagnoses     Special screening for malignant neoplasms, colon       Relevant Orders   Cologuard   Vitamin D deficiency       Relevant Orders   VITAMIN D 25 Hydroxy (Vit-D Deficiency, Fractures)       No orders of the defined types were placed in this encounter.   Follow-up: Return in about 4 months (around 10/26/2021) for Annual physical.    [name], MD

## 2021-06-28 NOTE — Assessment & Plan Note (Signed)
On Rapaflo, needs to start taking Finasteride Followed by Urology

## 2021-06-28 NOTE — Assessment & Plan Note (Signed)
Lab Results  Component Value Date   TSH 5.170 (H) 02/24/2021   Asymptomatic Check TSH and free T4

## 2021-06-28 NOTE — Assessment & Plan Note (Signed)
Lab Results  Component Value Date   HGBA1C 7.3 (A) 06/28/2021    On Glipizide 5 mg QD, was taking it twice daily, but had episode of hypoglycemia and now taking it QD Blood glucose at home has been around 80-120 Recently decreased dose of steroid as well, which should improve glycemic profile Advised to follow diabetic diet On ARB F/u CMP and HbA1C Diabetic eye exam: Advised to follow up with Ophthalmology for diabetic eye exam

## 2021-06-28 NOTE — Assessment & Plan Note (Signed)
Well controlled with omeprazole. 

## 2021-06-28 NOTE — Assessment & Plan Note (Signed)
Lipid profile reviewed On Crestor 10 mg daily, needs to remain compliant 

## 2021-06-28 NOTE — Assessment & Plan Note (Signed)
BP Readings from Last 1 Encounters:  06/28/21 136/88   Overall well-controlled with Amlodipine, Losartan-HCTZ and Metoprolol Counseled for compliance with the medications Advised DASH diet and moderate exercise/walking, at least 150 mins/week

## 2021-06-28 NOTE — Assessment & Plan Note (Signed)
On Methotrexate and Prednisone F/u with Dr Beekman 

## 2021-06-28 NOTE — Patient Instructions (Addendum)
Please continue to take medications as prescribed.  Please continue to follow low carb and low salt diet for now and ambulate as tolerated.  Please get fasting blood tests done before the next visit.  Please consider getting Shingrix vaccine at your local pharmacy. Please inquire whether you have received pneumococcal vaccine at [facility].

## 2021-06-29 ENCOUNTER — Ambulatory Visit (INDEPENDENT_AMBULATORY_CARE_PROVIDER_SITE_OTHER): Payer: Medicare Other

## 2021-06-29 ENCOUNTER — Other Ambulatory Visit: Payer: Self-pay | Admitting: Internal Medicine

## 2021-06-29 DIAGNOSIS — I739 Peripheral vascular disease, unspecified: Secondary | ICD-10-CM | POA: Diagnosis not present

## 2021-06-29 DIAGNOSIS — L539 Erythematous condition, unspecified: Secondary | ICD-10-CM | POA: Diagnosis not present

## 2021-06-29 DIAGNOSIS — I999 Unspecified disorder of circulatory system: Secondary | ICD-10-CM

## 2021-06-29 DIAGNOSIS — I10 Essential (primary) hypertension: Secondary | ICD-10-CM

## 2021-06-30 ENCOUNTER — Other Ambulatory Visit: Payer: Self-pay | Admitting: *Deleted

## 2021-06-30 DIAGNOSIS — I10 Essential (primary) hypertension: Secondary | ICD-10-CM

## 2021-06-30 DIAGNOSIS — N400 Enlarged prostate without lower urinary tract symptoms: Secondary | ICD-10-CM

## 2021-06-30 MED ORDER — FINASTERIDE 5 MG PO TABS: 5.0000 mg | ORAL_TABLET | Freq: Every day | ORAL | 3 refills | Status: DC

## 2021-06-30 MED ORDER — GLIPIZIDE 5 MG PO TABS: 5.0000 mg | ORAL_TABLET | Freq: Two times a day (BID) | ORAL | 0 refills | Status: DC

## 2021-06-30 MED ORDER — METOPROLOL SUCCINATE ER 50 MG PO TB24: 50.0000 mg | ORAL_TABLET | Freq: Two times a day (BID) | ORAL | 1 refills | Status: DC

## 2021-06-30 MED ORDER — AMLODIPINE BESYLATE 10 MG PO TABS: 10.0000 mg | ORAL_TABLET | Freq: Every day | ORAL | 0 refills | Status: DC

## 2021-06-30 MED ORDER — LOSARTAN POTASSIUM-HCTZ 100-25 MG PO TABS: 1.0000 | ORAL_TABLET | Freq: Every day | ORAL | 3 refills | Status: DC

## 2021-06-30 MED ORDER — SILODOSIN 8 MG PO CAPS: 8.0000 mg | ORAL_CAPSULE | Freq: Every day | ORAL | 11 refills | Status: DC

## 2021-07-01 ENCOUNTER — Other Ambulatory Visit: Payer: Self-pay | Admitting: Podiatry

## 2021-07-01 ENCOUNTER — Ambulatory Visit: Payer: Medicare Other | Admitting: Podiatry

## 2021-07-01 DIAGNOSIS — I739 Peripheral vascular disease, unspecified: Secondary | ICD-10-CM

## 2021-07-07 ENCOUNTER — Other Ambulatory Visit: Payer: Self-pay | Admitting: Internal Medicine

## 2021-07-07 DIAGNOSIS — I10 Essential (primary) hypertension: Secondary | ICD-10-CM

## 2021-07-07 MED ORDER — METOPROLOL TARTRATE 50 MG PO TABS: 50.0000 mg | ORAL_TABLET | Freq: Two times a day (BID) | ORAL | 3 refills | Status: DC

## 2021-07-07 NOTE — Progress Notes (Signed)
?  ?Cardiology Office Note ? ? ?Date:  07/08/2021  ? ?ID:  STCLAIR SZYMBORSKI, DOB Sep 27, 1945, MRN [id number] ? ?PCP:  [name], MD  ?Cardiologist:   Kathlyn Sacramento, MD  ? ?Chief Complaint  ?Patient presents with  ? Other  ?  Post vascular study. Meds reviewed verbally with pt.  ? ? ?  ?History of Present Illness: ?Jonathan [name] is a 76 y.o. male who was referred by Dr. Boneta Lucks for evaluation of peripheral arterial disease. ?He has past medical history of essential hypertension, diabetes mellitus, GERD, rheumatoid arthritis, chronic kidney disease and obesity.  He was seen by cardiology in the past in [place] and [place] but subsequently released.  He reports having cardiac catheterization many years ago and was told about mild nonobstructive disease. ? ?He recently started having sharp toe pain and numbness in both feet.  No ulceration.  He denies calf or thigh claudication. ? ?He underwent noninvasive vascular evaluation recently which showed moderately reduced ABI bilaterally at 0.65.  Duplex showed possible occluded peroneal artery bilaterally but no other obstructive disease. ?No chest pain, shortness of breath or palpitations. ? ?Past Medical History:  ?Diagnosis Date  ? Arthritis   ? ra, sees dr Sedonia Small  ? Coronary artery disease   ? saw dr Lupita Shutter in [place] 20 yrs ago, released from cardiology  ? Diabetes mellitus without complication ([place])   ? type 2  ? Hypertension   ? Urethral stricture   ? ? ?Past Surgical History:  ?Procedure Laterality Date  ? BACK SURGERY    ? x 3 lower back  ? CARDIAC CATHETERIZATION  2000  ? small amt plaque relased from cardiology 20 yrs ago  ? CHOLECYSTECTOMY    ? CYSTOSCOPY WITH URETHRAL DILATATION N/A 04/17/2019  ? Procedure: CYSTOSCOPY WITH URETHRAL DILATATION, CYSTOGRAM;  Surgeon: Ceasar Mons, MD;  Location[facility];  Service: Urology;  Laterality: N/A;  ? EYE SURGERY Bilateral   ? cataracts  ? urethral stricture surgery  15  yrs ago  ? ? ? ?Current Outpatient Medications  ?Medication Sig Dispense Refill  ? amLODipine (NORVASC) 10 MG tablet Take 1 tablet (10 mg total) by mouth daily. 30 tablet 0  ? aspirin EC 81 MG tablet Take 81 mg by mouth daily. Swallow whole.    ? Cyanocobalamin 2500 MCG CHEW Chew by mouth daily.    ? dicyclomine (BENTYL) 20 MG tablet Take 1 tablet (20 mg total) by mouth 2 (two) times daily between meals as needed for spasms. 12 tablet 0  ? famotidine (PEPCID) 20 MG tablet Take 1 tablet (20 mg total) by mouth 2 (two) times daily. 30 tablet 0  ? finasteride (PROSCAR) 5 MG tablet Take 1 tablet (5 mg total) by mouth daily. 90 tablet 3  ? folic acid (FOLVITE) 1 MG tablet Take 1 mg by mouth daily.    ? glipiZIDE (GLUCOTROL) 5 MG tablet Take 1 tablet (5 mg total) by mouth 2 (two) times daily before a meal. 30 tablet 0  ? losartan-hydrochlorothiazide (HYZAAR) 100-25 MG tablet Take 1 tablet by mouth daily. 90 tablet 3  ? methotrexate (RHEUMATREX) 2.5 MG tablet Caution:Chemotherapy. Protect from light. Take 5 tablets PO once weekly.    ? metoprolol tartrate (LOPRESSOR) 50 MG tablet Take 1 tablet (50 mg total) by mouth 2 (two) times daily. 180 tablet 3  ? omeprazole (PRILOSEC) 20 MG capsule Take 1 capsule (20 mg total) by mouth daily. 30 capsule 0  ?  predniSONE (DELTASONE) 1 MG tablet Take 1 mg by mouth daily with breakfast.    ? rosuvastatin (CRESTOR) 10 MG tablet Take 1 tablet (10 mg total) by mouth daily. 90 tablet 1  ? silodosin (RAPAFLO) 8 MG CAPS capsule Take 1 capsule (8 mg total) by mouth at bedtime. 30 capsule 11  ? nitrofurantoin, macrocrystal-monohydrate, (MACROBID) 100 MG capsule Take 1 capsule (100 mg total) by mouth every 12 (twelve) hours. (Patient not taking: Reported on 07/08/2021) 14 capsule 0  ? ondansetron (ZOFRAN) 4 MG tablet Take 1 tablet (4 mg total) by mouth every 6 (six) hours. (Patient not taking: Reported on 07/08/2021) 8 tablet 0  ? ?No current facility-administered medications for this visit.   ? ? ?Allergies:   Ciprofloxacin and Oxycodone-acetaminophen  ? ? ?Social History:  The patient  reports that he has never smoked. He has never used smokeless tobacco. He reports that he does not drink alcohol and does not use drugs.  ? ?Family History:  The patient's family history includes Heart Problems in his father.  ? ? ?ROS:  Please see the history of present illness.   Otherwise, review of systems are positive for none.   All other systems are reviewed and negative.  ? ? ?PHYSICAL EXAM: ?VS:  BP 120/78 (BP Location: Right Arm, Patient Position: Sitting, Cuff Size: Normal)   Pulse 79   Ht '5\' 9"'$  (1.753 m)   Wt 229 lb 8 oz (104.1 kg)   SpO2 93%   BMI 33.89 kg/m?  , BMI Body mass index is 33.89 kg/m?. ?GEN: Well nourished, well developed, in no acute distress  ?HEENT: normal  ?Neck: no JVD, carotid bruits, or masses ?Cardiac: RRR; no murmurs, rubs, or gallops,no edema  ?Respiratory:  clear to auscultation bilaterally, normal work of breathing ?GI: soft, nontender, nondistended, + BS ?MS: no deformity or atrophy  ?Skin: warm and dry, no rash ?Neuro:  Strength and sensation are intact ?Psych: euthymic mood, full affect ?Vascular: Radial pulses normal bilaterally.  Femoral pulses normal bilaterally.  Distal pulses are not palpable on both sides. ? ?EKG:  EKG is ordered today. ?The ekg ordered today demonstrates sinus rhythm with first-degree AV block. ? ? ?Recent Labs: ?12/28/2020: Hemoglobin 13.5; Platelets 208 ?02/24/2021: ALT 28; BUN 26; Creatinine, Ser 1.36; Potassium 4.2; Sodium 136; TSH 5.170  ? ? ?Lipid Panel ?   ?Component Value Date/Time  ? CHOL 217 (H) 10/23/2020 0908  ? TRIG 140 10/23/2020 0908  ? HDL 53 10/23/2020 0908  ? CHOLHDL 4.1 10/23/2020 0908  ? CHOLHDL 2.9 Ratio 07/16/2007 2228  ? VLDL 20 07/16/2007 2228  ? [place] 139 (H) 10/23/2020 0908  ? ?  ? ?Wt Readings from Last 3 Encounters:  ?07/08/21 229 lb 8 oz (104.1 kg)  ?06/28/21 230 lb 6.4 oz (104.5 kg)  ?05/12/21 225 lb (102.1 kg)  ?   ? ? ? ?PAD Screen 07/08/2021  ?Previous PAD dx? No  ?Previous surgical procedure? No  ?Pain with walking? No  ?Feet/toe relief with dangling? No  ?Painful, non-healing ulcers? No  ?Extremities discolored? Yes  ? ? ? ? ?ASSESSMENT AND PLAN: ? ?1.  Peripheral arterial disease: The patient has evidence of peripheral arterial disease mainly affecting his below the knee vessels.  His femoral and popliteal pulses are normal but distal pulses are not palpable.  Suspect small vessel disease.  Current symptoms of sharp pain and numbness are likely multifactorial due to peripheral arterial disease as well as neuropathy related to diabetes.  Currently has  no open ulceration.  No indication for revascularization at the present time.  I discussed with him the importance of proper foot hygiene and also discussed the importance of at least 30 minutes of exercise daily.  If symptoms worsen, the neck step would be to proceed with angiography and possible endovascular intervention.  Should avoid surgeries on his feet as possible. ? ?2.  Hyperlipidemia: Previous lipid profile showed an LDL of 139.  He was recently started on rosuvastatin 10 mg once daily which I agree with considering peripheral arterial disease and diabetes mellitus.  Recommend a target LDL of less than 70. ? ?3.  Essential hypertension: Blood pressures well controlled on current medications. ? ? ? ?Disposition:   FU with me in 6 months ? ?Signed, ? ?Kathlyn Sacramento, MD  ?07/08/2021 10:37 AM    ?[place] ?

## 2021-07-07 NOTE — Progress Notes (Signed)
Patient was on Metoprolol tartrate 50 mg BID. Was reported as ER form, but later received clarification from [facility] and changed to Metoprolol Tartrate. ?

## 2021-07-08 ENCOUNTER — Other Ambulatory Visit: Payer: Self-pay

## 2021-07-08 ENCOUNTER — Encounter: Payer: Self-pay | Admitting: Cardiovascular Disease

## 2021-07-08 ENCOUNTER — Ambulatory Visit (INDEPENDENT_AMBULATORY_CARE_PROVIDER_SITE_OTHER): Payer: Medicare Other | Admitting: Cardiovascular Disease

## 2021-07-08 ENCOUNTER — Ambulatory Visit: Payer: Medicare Other | Admitting: Podiatry

## 2021-07-08 VITALS — BP 120/78 | HR 79 | Ht 69.0 in | Wt 229.5 lb

## 2021-07-08 DIAGNOSIS — I10 Essential (primary) hypertension: Secondary | ICD-10-CM | POA: Diagnosis not present

## 2021-07-08 DIAGNOSIS — E785 Hyperlipidemia, unspecified: Secondary | ICD-10-CM

## 2021-07-08 DIAGNOSIS — I739 Peripheral vascular disease, unspecified: Secondary | ICD-10-CM

## 2021-07-08 NOTE — Patient Instructions (Signed)
Medication Instructions:  ?No changes at this time.  ? ?*If you need a refill on your cardiac medications before your next appointment, please call your pharmacy* ? ? ?Lab Work: ?None ? ?If you have labs (blood work) drawn today and your tests are completely normal, you will receive your results only by: ?MyChart Message (if you have MyChart) OR ?A paper copy in the mail ?If you have any lab test that is abnormal or we need to change your treatment, we will call you to review the results. ? ? ?Testing/Procedures: ?None ? ? ?Follow-Up: ?At [facility], you and your health needs are our priority.  As part of our continuing mission to provide you with exceptional heart care, we have created designated Provider Care Teams.  These Care Teams include your primary Cardiologist (physician) and Advanced Practice Providers (APPs -  Physician Assistants and Nurse Practitioners) who all work together to provide you with the care you need, when you need it. ? ?We recommend signing up for the patient portal called "MyChart".  Sign up information is provided on this After Visit Summary.  MyChart is used to connect with patients for Virtual Visits (Telemedicine).  Patients are able to view lab/test results, encounter notes, upcoming appointments, etc.  Non-urgent messages can be sent to your provider as well.   ?To learn more about what you can do with MyChart, go to [email or web address].   ? ?Your next appointment:   ?6 month(s) ? ?The format for your next appointment:   ?In Person ? ?Provider:   ?You may see Dr. Kathlyn Sacramento or one of the following Advanced Practice Providers on your designated Care Team:   ?Murray Hodgkins, NP ?Christell Faith, PA-C ?Cadence Kathlen Mody, PA-C{ ?

## 2021-07-12 ENCOUNTER — Ambulatory Visit
Admission: RE | Admit: 2021-07-12 | Discharge: 2021-07-12 | Disposition: A | Discharge status: Home / Self Care | Payer: Medicare Other | Source: Ambulatory Visit | Attending: Podiatry | Admitting: Podiatry

## 2021-07-12 DIAGNOSIS — I739 Peripheral vascular disease, unspecified: Secondary | ICD-10-CM

## 2021-07-12 NOTE — Consult Note (Incomplete)
Chief Complaint: Patient was seen in consultation today for No chief complaint on file.  at the request of Patel,Kevin P  Referring Physician(s): Patel,Kevin P  PCP: Dr. Agnes Lawrence  History of Present Illness: Jonathan [name] is a 76 y.o. male ***  Past Medical History:  Diagnosis Date   Arthritis    ra, sees dr Sedonia Small   Coronary artery disease    saw dr zachery in [place] 20 yrs ago, released from cardiology   Diabetes mellitus without complication ([place])    type 2   Hypertension    Urethral stricture     Past Surgical History:  Procedure Laterality Date   BACK SURGERY     x 3 lower back   CARDIAC CATHETERIZATION  2000   small amt plaque relased from cardiology 20 yrs ago   [place] N/A 04/17/2019   Procedure: CYSTOSCOPY WITH URETHRAL DILATATION, CYSTOGRAM;  Surgeon: Ceasar Mons, MD;  Location: [facility];  Service: Urology;  Laterality: N/A;   EYE SURGERY Bilateral    cataracts   urethral stricture surgery  15 yrs ago    Allergies: Ciprofloxacin and Oxycodone-acetaminophen  Medications: Prior to Admission medications   Medication Sig Start Date End Date Taking? Authorizing Provider  amLODipine (NORVASC) 10 MG tablet Take 1 tablet (10 mg total) by mouth daily. 06/30/21   [name], MD  aspirin EC 81 MG tablet Take 81 mg by mouth daily. Swallow whole.    [provider]  Cyanocobalamin 2500 MCG CHEW Chew by mouth daily.    [provider]  dicyclomine (BENTYL) 20 MG tablet Take 1 tablet (20 mg total) by mouth 2 (two) times daily between meals as needed for spasms. 12/28/20   Scot Jun, FNP  famotidine (PEPCID) 20 MG tablet Take 1 tablet (20 mg total) by mouth 2 (two) times daily. 12/28/20   Scot Jun, FNP  finasteride (PROSCAR) 5 MG tablet Take 1 tablet (5 mg total) by mouth daily. 06/30/21   [name], MD  folic acid (FOLVITE)  1 MG tablet Take 1 mg by mouth daily.    [provider]  glipiZIDE (GLUCOTROL) 5 MG tablet Take 1 tablet (5 mg total) by mouth 2 (two) times daily before a meal. 06/30/21   Patel, Colin Broach, MD  losartan-hydrochlorothiazide (HYZAAR) 100-25 MG tablet Take 1 tablet by mouth daily. 06/30/21   [name], MD  methotrexate (RHEUMATREX) 2.5 MG tablet Caution:Chemotherapy. Protect from light. Take 5 tablets PO once weekly.    [provider]  metoprolol tartrate (LOPRESSOR) 50 MG tablet Take 1 tablet (50 mg total) by mouth 2 (two) times daily. 07/07/21   [name], MD  nitrofurantoin, macrocrystal-monohydrate, (MACROBID) 100 MG capsule Take 1 capsule (100 mg total) by mouth every 12 (twelve) hours. Patient not taking: Reported on 07/08/2021 03/31/21   Cleon Gustin, MD  omeprazole (PRILOSEC) 20 MG capsule Take 1 capsule (20 mg total) by mouth daily. 11/03/20   [name], MD  ondansetron (ZOFRAN) 4 MG tablet Take 1 tablet (4 mg total) by mouth every 6 (six) hours. Patient not taking: Reported on 07/08/2021 12/28/20   Scot Jun, FNP  predniSONE (DELTASONE) 1 MG tablet Take 1 mg by mouth daily with breakfast.    [provider]  rosuvastatin (CRESTOR) 10 MG tablet Take 1 tablet (10 mg total) by mouth daily. 02/24/21   Posey Pronto,  Colin Broach, MD  silodosin (RAPAFLO) 8 MG CAPS capsule Take 1 capsule (8 mg total) by mouth at bedtime. 06/30/21   [name], MD     Family History  Problem Relation Age of Onset   Heart Problems Father     Social History   Socioeconomic History   Marital status: Widowed    Spouse name: Not on file   Number of children: Not on file   Years of education: Not on file   Highest education level: Not on file  Occupational History   Not on file  Tobacco Use   Smoking status: Never   Smokeless tobacco: Never  Vaping Use   Vaping Use: Never used  Substance and Sexual Activity   Alcohol use: No   Drug use: No   Sexual activity:  Not on file  Other Topics Concern   Not on file  Social History Narrative   Not on file   Social Determinants of Health   Financial Resource Strain: Low Risk    Difficulty of Paying Living Expenses: Not hard at all  Food Insecurity: No Food Insecurity   Worried About Running Out of Food in the Last Year: Never true   [place] in the Last Year: Never true  Transportation Needs: No Transportation Needs   Lack of Transportation (Medical): No   Lack of Transportation (Non-Medical): No  Physical Activity: Insufficiently Active   Days of Exercise per Week: 3 days   Minutes of Exercise per Session: 30 min  Stress: No Stress Concern Present   Feeling of Stress : Not at all  Social Connections: Moderately Integrated   Frequency of Communication with Friends and Family: More than three times a week   Frequency of Social Gatherings with Friends and Family: More than three times a week   Attends Religious Services: More than 4 times per year   Active Member of [employer] or Organizations: No   Attends Archivist Meetings: Never   Marital Status: Married       Review of Systems: A 12 point ROS discussed and pertinent positives are indicated in the HPI above.  All other systems are negative.  Review of Systems  Vital Signs: BP (!) 152/74 (BP Location: Left Arm)    Pulse 90    SpO2 96%   Physical Exam General: 76 yo male appearing stated age.  Well-developed, well-nourished.  No distress. HEENT: Atraumatic, normocephalic.  Conjugate gaze, extra-ocular motor intact. No scleral icterus or scleral injection. No lesions on external ears, nose, lips, or gums.  Oral mucosa moist, pink.  Neck: Symmetric with no goiter enlargement.  Chest/Lungs:  Symmetric chest with inspiration/expiration.  No labored breathing.  Clear to auscultation with no wheezes, rhonchi, or rales.  Heart:  RRR, with no third heart sounds appreciated. No JVD appreciated.  Abdomen:  Soft, NT/ND, with + bowel  sounds.   Genito-urinary: Deferred Neurologic: Alert & Oriented to person, place, and time.   Normal affect and insight.  Appropriate questions.  Moving all 4 extremities with gross sensory intact.  Pulse Exam:  No bruit appreciated.   Palpable right CFA & left CFA.  Doppler positive right and left PT and DP, however monophasic. Extremities: No wound.  Warm extremity. Hairless.  Trophic changes including dry/flaking skin bilateral plantar surface   Mallampati Score:     Imaging: VAS [place] LOWER EXT ART SEG MULTI (SEGMENTALS & LE RAYNAUDS)  Result Date: 06/29/2021  LOWER EXTREMITY DOPPLER STUDY Patient Name:  Ane Payment  Date of Exam:   06/29/2021 Medical Rec #: [id number]         Accession #:    [id number] Date of Birth: March 24, 1946          Patient Gender: M Patient Age:   89 years Exam Location:  [place] Procedure:      VAS [place] LOWER EXT ART SEG MULTI (SEGMENTALS & LE RAYNAUDS) Referring Phys: Lennette Bihari PATEL --------------------------------------------------------------------------------  Indications: Peripheral artery disease, and Bilateral foot pain throughout the              AM and PM. High Risk Factors: Hypertension, hyperlipidemia, Diabetes, no history of                    smoking, coronary artery disease.  Comparison Study: No previous report on record Performing Technologist: Pilar Jarvis RVT  Examination Guidelines: A complete evaluation includes at minimum, Doppler waveform signals and systolic blood pressure reading at the level of bilateral brachial, anterior tibial, and posterior tibial arteries, when vessel segments are accessible. Bilateral testing is considered an integral part of a complete examination. Photoelectric Plethysmograph (PPG) waveforms and toe systolic pressure readings are included as required and additional duplex testing as needed. Limited examinations for reoccurring indications may be performed as noted.  ABI Findings:  +---------+------------------+-----+-------------------+--------+  Right     Rt Pressure (mmHg) Index Waveform            Comment   +---------+------------------+-----+-------------------+--------+  Brachial  129                      triphasic                     +---------+------------------+-----+-------------------+--------+  CFA                                triphasic                     +---------+------------------+-----+-------------------+--------+  Popliteal                          triphasic                     +---------+------------------+-----+-------------------+--------+  PTA       84                 0.65  dampened monophasic           +---------+------------------+-----+-------------------+--------+  PERO      71                 0.55  dampened monophasic           +---------+------------------+-----+-------------------+--------+  DP        75                 0.58  dampened monophasic           +---------+------------------+-----+-------------------+--------+  Great Toe                          Absent                        +---------+------------------+-----+-------------------+--------+ +---------+------------------+-----+-------------------+-------+  Left      Lt Pressure (mmHg) Index Waveform  Comment  +---------+------------------+-----+-------------------+-------+  Brachial  126                      triphasic                    +---------+------------------+-----+-------------------+-------+  CFA                                triphasic                    +---------+------------------+-----+-------------------+-------+  Popliteal                          triphasic                    +---------+------------------+-----+-------------------+-------+  PTA       83                 0.64  dampened monophasic          +---------+------------------+-----+-------------------+-------+  PERO      75                 0.58  dampened monophasic           +---------+------------------+-----+-------------------+-------+  DP        78                 0.60  dampened monophasic          +---------+------------------+-----+-------------------+-------+  Great Toe                          Absent                       +---------+------------------+-----+-------------------+-------+ +-------+-----------+-----------+------------+------------+  ABI/TBI Today's ABI Today's TBI Previous ABI Previous TBI  +-------+-----------+-----------+------------+------------+  Right   0.65                                               +-------+-----------+-----------+------------+------------+  Left    0.64                                               +-------+-----------+-----------+------------+------------+ TOES Findings: +----------+---------------+--------+-------+  Right Toes Pressure (mmHg) Waveform Comment  +----------+---------------+--------+-------+  1st Digit                  Absent            +----------+---------------+--------+-------+  2nd Digit                  Abnormal          +----------+---------------+--------+-------+  3rd Digit                  Abnormal          +----------+---------------+--------+-------+  4th Digit                  Abnormal          +----------+---------------+--------+-------+  5th Digit                  Abnormal          +----------+---------------+--------+-------+  +---------+---------------+--------+-------+  Left Toes Pressure (mmHg) Waveform Comment  +---------+---------------+--------+-------+  1st Digit                 Abnormal          +---------+---------------+--------+-------+  2nd Digit                 Abnormal          +---------+---------------+--------+-------+  3rd Digit                 Abnormal          +---------+---------------+--------+-------+  4th Digit                 Abnormal          +---------+---------------+--------+-------+  5th Digit                 Abnormal          +---------+---------------+--------+-------+   SEE  ARTERIAL DUPLEX EXAM OF THE SAME DATE  Summary: Right: Resting right ankle-brachial index indicates moderate right lower extremity arterial disease. The right toe-brachial index is abnormal. Left: Resting left ankle-brachial index indicates moderate left lower extremity arterial disease. The left toe-brachial index is abnormal.  *See table(s) above for measurements and observations.  Vascular consult recommended. Electronically signed by Quay Burow MD on 06/29/2021 at 6:24:26 PM.    Final    VAS [place] LOWER EXTREMITY ARTERIAL DUPLEX  Result Date: 06/29/2021 LOWER EXTREMITY ARTERIAL DUPLEX STUDY Patient Name:  [place]  Date of Exam:   06/29/2021 Medical Rec #: [id number]         Accession #:    [id number] Date of Birth: 07-16-1945          Patient Gender: M Patient Age:   17 years Exam Location:  [place] Procedure:      VAS [place] LOWER EXTREMITY ARTERIAL DUPLEX Referring Phys: Lennette Bihari PATEL --------------------------------------------------------------------------------  Indications: Peripheral artery disease, and Bilateral foot pain throughout the              day and night. High Risk Factors: Hypertension, hyperlipidemia, Diabetes, no history of                    smoking, coronary artery disease.  Current ABI: 0.65 RIGHT, 0.64 LEFT Comparison Study: No previous exam on record Performing Technologist: Pilar Jarvis RDMS, RVT, RDCS  Examination Guidelines: A complete evaluation includes B-mode imaging, spectral Doppler, color Doppler, and power Doppler as needed of all accessible portions of each vessel. Bilateral testing is considered an integral part of a complete examination. Limited examinations for reoccurring indications may be performed as noted.  +----------+--------+-----+--------+---------+--------+  RIGHT      PSV cm/s Ratio Stenosis Waveform  Comments  +----------+--------+-----+--------+---------+--------+  CFA Prox   88                      triphasic            +----------+--------+-----+--------+---------+--------+  CFA Distal 82                      triphasic           +----------+--------+-----+--------+---------+--------+  DFA        106                     biphasic            +----------+--------+-----+--------+---------+--------+  SFA Prox   89  biphasic            +----------+--------+-----+--------+---------+--------+  SFA Mid    71                      triphasic           +----------+--------+-----+--------+---------+--------+  SFA Distal 53                      triphasic           +----------+--------+-----+--------+---------+--------+  POP Prox   64                      triphasic           +----------+--------+-----+--------+---------+--------+  POP Mid    47                      triphasic           +----------+--------+-----+--------+---------+--------+  POP Distal 22                      biphasic            +----------+--------+-----+--------+---------+--------+  TP Trunk   85                      triphasic           +----------+--------+-----+--------+---------+--------+  ATA Mid    24                                          +----------+--------+-----+--------+---------+--------+  PTA Mid    24                                          +----------+--------+-----+--------+---------+--------+  PERO Mid                                     not seen  +----------+--------+-----+--------+---------+--------+  +----------+--------+-----+--------+---------+--------+  LEFT       PSV cm/s Ratio Stenosis Waveform  Comments  +----------+--------+-----+--------+---------+--------+  CFA Prox   108                     triphasic           +----------+--------+-----+--------+---------+--------+  CFA Distal 102                     triphasic           +----------+--------+-----+--------+---------+--------+  DFA        90                      biphasic            +----------+--------+-----+--------+---------+--------+  SFA Prox   113                     biphasic             +----------+--------+-----+--------+---------+--------+  SFA Mid    68                      triphasic           +----------+--------+-----+--------+---------+--------+  SFA Distal 151                     triphasic           +----------+--------+-----+--------+---------+--------+  POP Prox   69                      triphasic           +----------+--------+-----+--------+---------+--------+  POP Mid    53                      biphasic            +----------+--------+-----+--------+---------+--------+  POP Distal 41                      triphasic           +----------+--------+-----+--------+---------+--------+  TP Trunk   76                      triphasic           +----------+--------+-----+--------+---------+--------+  ATA Mid    34                                          +----------+--------+-----+--------+---------+--------+  PTA Mid    29                                          +----------+--------+-----+--------+---------+--------+  PERO Mid                                     not seen  +----------+--------+-----+--------+---------+--------+ A focal velocity elevation of 151 cm/s was obtained at Distal SFA with a VR of 2.2. Findings are characteristic of 30-49% stenosis.  Summary: Right: Total occlusion noted in the peroneal artery. Left: 30-49% stenosis noted in the superficial femoral artery. Total occlusion noted in the peroneal artery. SEE ARTERIAL DOPPLER EXAM OF THE SAME DATE.  See table(s) above for measurements and observations. Vascular consult recommended. Electronically signed by Quay Burow MD on 06/29/2021 at 6:16:52 PM.    Final     Labs:  CBC: Recent Labs    10/23/20 0908 12/28/20 1616  WBC 5.8 8.1  HGB 13.6 13.5  HCT 39.5 40.8  PLT 152 208    COAGS: No results for input(s): INR, APTT in the last 8760 hours.  BMP: Recent Labs    12/28/20 1616 01/06/21 0803 02/03/21 1533 02/24/21 1043  NA 129* 141 138 136  K 3.9 4.5 3.8 4.2  CL 94* 103 101 100  CO2 16* '22  20 22  '$ GLUCOSE 51* 141* 137* 157*  BUN 47* 19 30* 26  CALCIUM 8.3* 9.2 8.8 9.4  CREATININE 3.43* 1.20 1.68* 1.36*    LIVER FUNCTION TESTS: Recent Labs    10/23/20 0908 02/24/21 1043  BILITOT 0.6 0.5  AST 54* 31  ALT 55* 28  ALKPHOS 91 87  PROT 7.6 7.7  ALBUMIN 4.1 3.8    TUMOR MARKERS: No results for input(s): AFPTM, CEA, CA199, CHROMGRNA in the last 8760 hours.  Assessment:  Mr Catala is a 76yo male presenting with resting pain of the right lower  extremity, [place] 4 category symptoms of CLI/CLTI.   Non-invasive lower extremity exam shows evidence of developing distal fem-pop disease and tibial disease.    I had a lengthy discussion with Mr Tippett and his wife regarding anatomy, pathology/pathophysiology, natural history, and prognosis of PAD/CLI.  Informed consent regarding treatment strategies was performed which would possibly include medical management, [walking program], surgical strategy, and/or endovascular options, with risk/benefit discussion.  The indications for treatment supported by updated guidelines1, 2 were discussed.  Given the presence of diabetes, healthy foot care was also discussed, in accord with multi-disciplinary, Class 1 recommendations.1,3 Current recommendations advocate daily foot inspection, good nail care, avoiding barefoot walking, properly fitted footwear, seeking care with problems, and consideration of initiating routine podiatric care with at least annual inspection3.     Patient has elected to proceed with medical management and home-based exercise program after cardiovascular clearance.  The following walking program was discussed:  at least 30-45 min sessions of walking to reproduce moderate to severe symptoms of claudication, alternating with periods of rest, at least 3 days per week, for at least 12 weeks.  Given the absence of a history of CHF, supplementing with cilostazol/pletal was discussed.   Patient has elected to proceed with  endovascular options.   Regarding endovascular options, specific risks discussed include: bleeding, infection, contrast reaction, renal injury/nephropathy, arterial injury/dissection, need for additional procedure/surgery, worsening symptoms/tissue including limb loss, cardiopulmonary collapse, death.    Regarding medical management, maximal medical therapy for reduction of risk factors is indicated as recommended by updated AHA guidelines1.  This includes anti-platelet medication, tight blood glucose control to a HbA1c < 7, tight blood pressure control, maximum-dose HMG-CoA reductase inhibitor, and smoking cessation.  Smoking cessation was addressed, with strategies including counseling and nicotine replacement.  He/she expressed [a desire to attempt cessation/no interest in pursuing treatment at this time.]   Annual flu vaccination is also recommended, with Class 1 recommendation1.   Plan: - He and his wife would prefer a conservative approach for now, and has asked for referral to initiate walking program.  - We will send referral for outpatient rehab walking program - We will set him up for 4 month follow up clinic visit to discuss his symptoms.  We are happy to see him back earlier if he has any worsening of symptoms -Recommend maximal medical therapy for cardiovascular risk reduction, including anti-platelet therapy. -Observe healthy foot care habits, given the presence of diabetes, with at least annual foot inspection performed in the setting of DM.    -Annual flu vaccination is recommended in the setting of known PAD, in the absence of contra-indications.    ___________________________________________________________________   1Morley Kos MD, et al. 2016 AHA/ACC Guideline on the Management of Patients With Lower Extremity Peripheral Artery Disease: Executive Summary: A Report of the American College of Cardiology/American Heart Association Task Force on Clinical Practice Guidelines. J  Am Coll Cardiol. 2017 Mar 21;69(11):1465-1508. doi: 10.1016/j.jacc.2016.11.008.   2 - Norgren L, et al. TASC II Working Group. Inter-society consensus for the management of peripheral arterial disease. Int Tressia Miners. 2007 Jun;26(2):81-157. Review. PubMed PMID: 59935701  3 - Hingorani A, et al. The management of diabetic foot: A clinical practice guideline by the Society for Vascular Surgery in collaboration with the [place] and the Society  for Vascular Medicine. J Vasc Surg. 2016 Feb;63(2 Suppl):3S-21S. doi: 10.1016/j.jvs.2015.10.003. PubMed PMID: 77939030.  4 - Corinna Gab, Saab FA, Lorenda Peck, Ewell Poe, Driver VR, [place], Mariana Kaufman  den Baldemar Lenis, Jaff MR, Guadalupe Dawn, Henao S, AlMahameed A, Katzen B. Digital Subtraction Angiography Prior to an Amputation for Critical Limb Ischemia (CLI): An Expert Recommendation Statement From the CLI Global Society to Optimize Limb Salvage. J Endovasc Ther. 2020 Aug;27(4):540-546. doi: 10.1177/1526602820928590. Epub 2020 May 29. PMID: 02637858.    Thank you for this interesting consult.  I greatly enjoyed meeting Dontrez L [name] and look forward to participating in their care.  A copy of this report was sent to the requesting provider on this date.  Electronically Signed: Corrie Mckusick 07/12/2021, 5:35 PM   I spent a total of {New IFOY:[id number]} {New Out-Pt:[id number]}  {Established Out-Pt:[id number]} in face to face in clinical consultation, greater than 50% of which was counseling/coordinating care for ***

## 2021-07-13 ENCOUNTER — Encounter: Payer: Self-pay | Admitting: *Deleted

## 2021-07-21 ENCOUNTER — Telehealth: Payer: Self-pay | Admitting: *Deleted

## 2021-07-21 NOTE — Telephone Encounter (Signed)
Dr Earleen Newport req pt to be referred to "waking program". I have lmom x2 at [facility] [place] to schedule patient./vm ?

## 2021-07-22 LAB — COLOGUARD: COLOGUARD: POSITIVE — AB

## 2021-07-23 ENCOUNTER — Other Ambulatory Visit: Payer: Self-pay

## 2021-07-23 DIAGNOSIS — Z1211 Encounter for screening for malignant neoplasm of colon: Secondary | ICD-10-CM

## 2021-07-27 ENCOUNTER — Encounter: Payer: Self-pay | Admitting: Internal Medicine

## 2021-07-28 ENCOUNTER — Telehealth: Payer: Self-pay

## 2021-07-28 ENCOUNTER — Other Ambulatory Visit: Payer: Self-pay

## 2021-07-28 DIAGNOSIS — N400 Enlarged prostate without lower urinary tract symptoms: Secondary | ICD-10-CM

## 2021-07-28 DIAGNOSIS — I10 Essential (primary) hypertension: Secondary | ICD-10-CM

## 2021-07-28 MED ORDER — SILODOSIN 8 MG PO CAPS: 8.0000 mg | ORAL_CAPSULE | Freq: Every day | ORAL | 1 refills | Status: DC

## 2021-07-28 MED ORDER — LOSARTAN POTASSIUM-HCTZ 100-25 MG PO TABS: 1.0000 | ORAL_TABLET | Freq: Every day | ORAL | 1 refills | Status: DC

## 2021-07-28 MED ORDER — METOPROLOL TARTRATE 50 MG PO TABS: 50.0000 mg | ORAL_TABLET | Freq: Two times a day (BID) | ORAL | 1 refills | Status: DC

## 2021-07-28 MED ORDER — FINASTERIDE 5 MG PO TABS: 5.0000 mg | ORAL_TABLET | Freq: Every day | ORAL | 1 refills | Status: DC

## 2021-07-28 MED ORDER — AMLODIPINE BESYLATE 10 MG PO TABS: 10.0000 mg | ORAL_TABLET | Freq: Every day | ORAL | 1 refills | Status: DC

## 2021-07-28 NOTE — Telephone Encounter (Signed)
Refilled

## 2021-07-28 NOTE — Telephone Encounter (Signed)
Patient called said [facility] is  waiting on Dr patel approval on medicines ? ?metoprolol tartrate (LOPRESSOR) 50 MG tablet  ? ?losartan-hydrochlorothiazide (HYZAAR) 100-25 MG tablet  ? ?silodosin (RAPAFLO) 8 MG CAPS capsule  ? ?finasteride (PROSCAR) 5 MG tablet ? ?amLODipine (NORVASC) 10 MG tablet  ? ?amLODipine (NORVASC) 10 MG tablet  ? ?[facility] (setup for home delivery) ?

## 2021-08-03 ENCOUNTER — Other Ambulatory Visit: Payer: Self-pay

## 2021-08-06 ENCOUNTER — Ambulatory Visit: Payer: Commercial Managed Care - HMO | Admitting: Urology

## 2021-08-10 ENCOUNTER — Ambulatory Visit: Payer: Commercial Managed Care - HMO | Admitting: Urology

## 2021-08-19 ENCOUNTER — Other Ambulatory Visit: Payer: Self-pay | Admitting: Podiatry

## 2021-08-19 DIAGNOSIS — I739 Peripheral vascular disease, unspecified: Secondary | ICD-10-CM

## 2021-08-30 ENCOUNTER — Encounter: Payer: Self-pay | Admitting: Family Medicine

## 2021-08-30 ENCOUNTER — Ambulatory Visit (INDEPENDENT_AMBULATORY_CARE_PROVIDER_SITE_OTHER): Payer: Medicare Other | Admitting: Family Medicine

## 2021-08-30 VITALS — BP 114/80 | HR 69 | Ht 69.0 in | Wt 228.4 lb

## 2021-08-30 DIAGNOSIS — L299 Pruritus, unspecified: Secondary | ICD-10-CM | POA: Diagnosis not present

## 2021-08-30 DIAGNOSIS — W57XXXA Bitten or stung by nonvenomous insect and other nonvenomous arthropods, initial encounter: Secondary | ICD-10-CM | POA: Diagnosis not present

## 2021-08-30 MED ORDER — TRIAMCINOLONE ACETONIDE 0.1 % EX CREA: 1.0000 "application " | TOPICAL_CREAM | Freq: Two times a day (BID) | CUTANEOUS | 0 refills | Status: DC

## 2021-08-30 NOTE — Patient Instructions (Signed)
I appreciate the opportunity to provide care to you today! ?  ?_ please pick up your kenalog cream at your local pharmacy ? ? ?  ?It was a pleasure to see you and I look forward to continuing to work together on your health and well-being. ?Please do not hesitate to call the office if you need care or have questions about your care. ?  ?Have a wonderful day and week. ?With Gratitude, ?Alvira Monday MSN, FNP-BC ? ?

## 2021-08-30 NOTE — Progress Notes (Signed)
? ?Established Patient Office Visit ? ?Subjective:  ?Patient ID: Jonathan [name], male    DOB: 1946/02/29  Age: 76 y.o. MRN: [id number] ? ?CC:  ?Chief Complaint  ?Patient presents with  ? Insect Bite  ?  Pt noticed itching, and swelling on right upper arm on 08/26/2021, states itching and swelling is spreading down his arm.   ? ? ?HPI ?Jonathan [name] is a 76 y.o. male with past medical history of essential hypertension and hemorrhoids presents with complaints of a bug bite on the right arm on 08/26/21 while mowing his lawn. He reports severe itching, swelling, and redness at the site. The patient states that he was fully covered and notes that there are many spiders where he mowed. He reports using hydrocortisone cream with relief. he notes that the swelling and redness have subsided, but the site is still very itchy. He denies fatigue and fever and reports a soreness at the site bit. ? ?Past Medical History:  ?Diagnosis Date  ? Arthritis   ? ra, sees dr Sedonia Small  ? Coronary artery disease   ? saw dr Lupita Shutter in [place] 20 yrs ago, released from cardiology  ? Diabetes mellitus without complication ([place])   ? type 2  ? Hypertension   ? Urethral stricture   ? ? ?Past Surgical History:  ?Procedure Laterality Date  ? BACK SURGERY    ? x 3 lower back  ? CARDIAC CATHETERIZATION  2000  ? small amt plaque relased from cardiology 20 yrs ago  ? CHOLECYSTECTOMY    ? CYSTOSCOPY WITH URETHRAL DILATATION N/A 04/17/2019  ? Procedure: CYSTOSCOPY WITH URETHRAL DILATATION, CYSTOGRAM;  Surgeon: Ceasar Mons, MD;  Location: [facility];  Service: Urology;  Laterality: N/A;  ? EYE SURGERY Bilateral   ? cataracts  ? IR RADIOLOGIST EVAL & MGMT  07/12/2021  ? urethral stricture surgery  15 yrs ago  ? ? ?Family History  ?Problem Relation Age of Onset  ? Heart Problems Father   ? ? ?Social History  ? ?Socioeconomic History  ? Marital status: Widowed  ?  Spouse name: Not on file  ? Number of children: Not on  file  ? Years of education: Not on file  ? Highest education level: Not on file  ?Occupational History  ? Not on file  ?Tobacco Use  ? Smoking status: Never  ? Smokeless tobacco: Never  ?Vaping Use  ? Vaping Use: Never used  ?Substance and Sexual Activity  ? Alcohol use: No  ? Drug use: No  ? Sexual activity: Not on file  ?Other Topics Concern  ? Not on file  ?Social History Narrative  ? Not on file  ? ?Social Determinants of Health  ? ?Financial Resource Strain: Low Risk   ? Difficulty of Paying Living Expenses: Not hard at all  ?Food Insecurity: No Food Insecurity  ? Worried About Charity fundraiser in the Last Year: Never true  ? Ran Out of Food in the Last Year: Never true  ?Transportation Needs: No Transportation Needs  ? Lack of Transportation (Medical): No  ? Lack of Transportation (Non-Medical): No  ?Physical Activity: Insufficiently Active  ? Days of Exercise per Week: 3 days  ? Minutes of Exercise per Session: 30 min  ?Stress: No Stress Concern Present  ? Feeling of Stress : Not at all  ?Social Connections: Moderately Integrated  ? Frequency of Communication with Friends and Family: More than three times a week  ? Frequency  of Social Gatherings with Friends and Family: More than three times a week  ? Attends Religious Services: More than 4 times per year  ? Active Member of Clubs or Organizations: No  ? Attends Archivist Meetings: Never  ? Marital Status: Married  ?Intimate Partner Violence: Not At Risk  ? Fear of Current or Ex-Partner: No  ? Emotionally Abused: No  ? Physically Abused: No  ? Sexually Abused: No  ? ? ?Outpatient Medications Prior to Visit  ?Medication Sig Dispense Refill  ? amLODipine (NORVASC) 10 MG tablet Take 1 tablet (10 mg total) by mouth daily. 90 tablet 1  ? aspirin EC 81 MG tablet Take 81 mg by mouth daily. Swallow whole.    ? Cyanocobalamin 2500 MCG CHEW Chew by mouth daily.    ? dicyclomine (BENTYL) 20 MG tablet Take 1 tablet (20 mg total) by mouth 2 (two) times daily  between meals as needed for spasms. 12 tablet 0  ? famotidine (PEPCID) 20 MG tablet Take 1 tablet (20 mg total) by mouth 2 (two) times daily. 30 tablet 0  ? finasteride (PROSCAR) 5 MG tablet Take 1 tablet (5 mg total) by mouth daily. 90 tablet 1  ? folic acid (FOLVITE) 1 MG tablet Take 1 mg by mouth daily.    ? glipiZIDE (GLUCOTROL) 5 MG tablet Take 1 tablet (5 mg total) by mouth 2 (two) times daily before a meal. 30 tablet 0  ? losartan-hydrochlorothiazide (HYZAAR) 100-25 MG tablet Take 1 tablet by mouth daily. 90 tablet 1  ? methotrexate (RHEUMATREX) 2.5 MG tablet Caution:Chemotherapy. Protect from light. Take 5 tablets PO once weekly.    ? metoprolol tartrate (LOPRESSOR) 50 MG tablet Take 1 tablet (50 mg total) by mouth 2 (two) times daily. 180 tablet 1  ? nitrofurantoin, macrocrystal-monohydrate, (MACROBID) 100 MG capsule Take 1 capsule (100 mg total) by mouth every 12 (twelve) hours. 14 capsule 0  ? omeprazole (PRILOSEC) 20 MG capsule Take 1 capsule (20 mg total) by mouth daily. 30 capsule 0  ? ondansetron (ZOFRAN) 4 MG tablet Take 1 tablet (4 mg total) by mouth every 6 (six) hours. 8 tablet 0  ? predniSONE (DELTASONE) 1 MG tablet Take 1 mg by mouth daily with breakfast.    ? rosuvastatin (CRESTOR) 10 MG tablet Take 1 tablet (10 mg total) by mouth daily. 90 tablet 1  ? silodosin (RAPAFLO) 8 MG CAPS capsule Take 1 capsule (8 mg total) by mouth at bedtime. 90 capsule 1  ? ?No facility-administered medications prior to visit.  ? ? ?Allergies  ?Allergen Reactions  ? Ciprofloxacin   ? Oxycodone-Acetaminophen Rash  ? ? ?ROS ?Review of Systems  ?Constitutional:  Negative for chills, fatigue and fever.  ?HENT:  Negative for sinus pressure, sneezing and sore throat.   ?Eyes:  Negative for photophobia, pain, redness and itching.  ?Respiratory:  Negative for choking, chest tightness, shortness of breath and wheezing.   ?Cardiovascular:  Negative for chest pain and palpitations.  ?Gastrointestinal:  Negative for diarrhea,  nausea and vomiting.  ?Endocrine: Negative for polydipsia, polyphagia and polyuria.  ?Genitourinary:  Negative for frequency and urgency.  ?Musculoskeletal:  Negative for back pain and neck pain.  ?Skin:  Positive for rash.  ?Neurological:  Negative for weakness, numbness and headaches.  ? ?  ?Objective:  ?  ?Physical Exam ?Constitutional:   ?   Appearance: Normal appearance.  ?HENT:  ?   Head: Normocephalic.  ?   Nose: No congestion or rhinorrhea.  ?Cardiovascular:  ?  Rate and Rhythm: Normal rate and regular rhythm.  ?   Pulses: Normal pulses.  ?   Heart sounds: Normal heart sounds.  ?Musculoskeletal:  ?   Cervical back: No rigidity or tenderness.  ?Skin: ?   General: Skin is warm and moist.  ?   Findings: No lesion or rash (darken pigmentation at the affected site with mild tenderness).  ?Neurological:  ?   Mental Status: He is alert and oriented to person, place, and time.  ? ? ?BP 114/80 (BP Location: Left Arm, Patient Position: Sitting)   Pulse 69   Ht 5' 9"  (1.753 m)   Wt 228 lb 6.4 oz (103.6 kg)   SpO2 98%   BMI 33.73 kg/m?  ?Wt Readings from Last 3 Encounters:  ?08/30/21 228 lb 6.4 oz (103.6 kg)  ?07/08/21 229 lb 8 oz (104.1 kg)  ?06/28/21 230 lb 6.4 oz (104.5 kg)  ? ? ?Lab Results  ?Component Value Date  ? TSH 5.170 (H) 02/24/2021  ? ?Lab Results  ?Component Value Date  ? WBC 8.1 12/28/2020  ? HGB 13.5 12/28/2020  ? HCT 40.8 12/28/2020  ? MCV 88 12/28/2020  ? PLT 208 12/28/2020  ? ?Lab Results  ?Component Value Date  ? NA 136 02/24/2021  ? K 4.2 02/24/2021  ? CO2 22 02/24/2021  ? GLUCOSE 157 (H) 02/24/2021  ? BUN 26 02/24/2021  ? CREATININE 1.36 (H) 02/24/2021  ? BILITOT 0.5 02/24/2021  ? ALKPHOS 87 02/24/2021  ? AST 31 02/24/2021  ? ALT 28 02/24/2021  ? PROT 7.7 02/24/2021  ? ALBUMIN 3.8 02/24/2021  ? CALCIUM 9.4 02/24/2021  ? EGFR 55 (L) 02/24/2021  ? ?Lab Results  ?Component Value Date  ? CHOL 217 (H) 10/23/2020  ? ?Lab Results  ?Component Value Date  ? HDL 53 10/23/2020  ? ?Lab Results   ?Component Value Date  ? [place] 139 (H) 10/23/2020  ? ?Lab Results  ?Component Value Date  ? TRIG 140 10/23/2020  ? ?Lab Results  ?Component Value Date  ? CHOLHDL 4.1 10/23/2020  ? ?Lab Results  ?Component Value D

## 2021-08-31 DIAGNOSIS — W57XXXA Bitten or stung by nonvenomous insect and other nonvenomous arthropods, initial encounter: Secondary | ICD-10-CM | POA: Insufficient documentation

## 2021-08-31 NOTE — Assessment & Plan Note (Addendum)
Physical examination unlikely for spider bite ?Kenalog cream ordered to help with pruritis ?

## 2021-09-07 ENCOUNTER — Ambulatory Visit (INDEPENDENT_AMBULATORY_CARE_PROVIDER_SITE_OTHER): Payer: Medicare Other | Admitting: Internal Medicine

## 2021-09-07 ENCOUNTER — Encounter: Payer: Self-pay | Admitting: Internal Medicine

## 2021-09-07 DIAGNOSIS — K529 Noninfective gastroenteritis and colitis, unspecified: Secondary | ICD-10-CM | POA: Diagnosis not present

## 2021-09-07 MED ORDER — AZITHROMYCIN 500 MG PO TABS: 500.0000 mg | ORAL_TABLET | Freq: Every day | ORAL | 0 refills | Status: DC

## 2021-09-07 NOTE — Progress Notes (Signed)
?  ? ?Virtual Visit via Telephone Note  ? ?This visit type was conducted due to national recommendations for restrictions regarding the COVID-19 Pandemic (e.g. social distancing) in an effort to limit this patient's exposure and mitigate transmission in our community.  Due to his co-morbid illnesses, this patient is at least at moderate risk for complications without adequate follow up.  This format is felt to be most appropriate for this patient at this time.  The patient did not have access to video technology/had technical difficulties with video requiring transitioning to audio format only (telephone).  All issues noted in this document were discussed and addressed.  No physical exam could be performed with this format. ? ?Evaluation Performed:  Follow-up visit ? ?Date:  09/07/2021  ? ?ID:  HEIDI LEMAY, DOB 11/08/1945, MRN [id number] ? ?Patient Location: Home ?Provider Location: Office/Clinic ? ?Participants: Patient ?Location of Patient: Home ?Location of Provider: Telehealth ?Consent was obtain for visit to be over via telehealth. ?I verified that I am speaking with the correct person using two identifiers. ? ?PCP:  [name], MD  ? ?Chief Complaint: Diarrhea ? ?History of Present Illness:   ? ?Jonathan [name] is a 76 y.o. male who has a televisit for c/o diarrhea for the last 9 days.  He has watery to loose BM, about 4-5 times per day.  He denies any unusual food intake recently.  He denies starting any new medicines recently.  Denies any melena or hematochezia currently.  Denies any fever, chills or vomiting currently. ? ?The patient does not have symptoms concerning for COVID-19 infection (fever, chills, cough, or new shortness of breath).  ? ?Past Medical, Surgical, Social History, Allergies, and Medications have been Reviewed. ? ?Past Medical History:  ?Diagnosis Date  ? Arthritis   ? ra, sees dr Sedonia Small  ? Coronary artery disease   ? saw dr Lupita Shutter in [place] 20 yrs ago, released from  cardiology  ? Diabetes mellitus without complication ([place])   ? type 2  ? Hypertension   ? Urethral stricture   ? ?Past Surgical History:  ?Procedure Laterality Date  ? BACK SURGERY    ? x 3 lower back  ? CARDIAC CATHETERIZATION  2000  ? small amt plaque relased from cardiology 20 yrs ago  ? CHOLECYSTECTOMY    ? CYSTOSCOPY WITH URETHRAL DILATATION N/A 04/17/2019  ? Procedure: CYSTOSCOPY WITH URETHRAL DILATATION, CYSTOGRAM;  Surgeon: Ceasar Mons, MD;  Location[facility];  Service: Urology;  Laterality: N/A;  ? EYE SURGERY Bilateral   ? cataracts  ? IR RADIOLOGIST EVAL & MGMT  07/12/2021  ? urethral stricture surgery  15 yrs ago  ?  ? ?Current Meds  ?Medication Sig  ? amLODipine (NORVASC) 10 MG tablet Take 1 tablet (10 mg total) by mouth daily.  ? aspirin EC 81 MG tablet Take 81 mg by mouth daily. Swallow whole.  ? Cyanocobalamin 2500 MCG CHEW Chew by mouth daily.  ? dicyclomine (BENTYL) 20 MG tablet Take 1 tablet (20 mg total) by mouth 2 (two) times daily between meals as needed for spasms.  ? famotidine (PEPCID) 20 MG tablet Take 1 tablet (20 mg total) by mouth 2 (two) times daily.  ? finasteride (PROSCAR) 5 MG tablet Take 1 tablet (5 mg total) by mouth daily.  ? folic acid (FOLVITE) 1 MG tablet Take 1 mg by mouth daily.  ? glipiZIDE (GLUCOTROL) 5 MG tablet Take 1 tablet (5 mg total) by mouth 2 (  two) times daily before a meal.  ? losartan-hydrochlorothiazide (HYZAAR) 100-25 MG tablet Take 1 tablet by mouth daily.  ? methotrexate (RHEUMATREX) 2.5 MG tablet Caution:Chemotherapy. Protect from light. Take 5 tablets PO once weekly.  ? metoprolol tartrate (LOPRESSOR) 50 MG tablet Take 1 tablet (50 mg total) by mouth 2 (two) times daily.  ? omeprazole (PRILOSEC) 20 MG capsule Take 1 capsule (20 mg total) by mouth daily.  ? ondansetron (ZOFRAN) 4 MG tablet Take 1 tablet (4 mg total) by mouth every 6 (six) hours.  ? predniSONE (DELTASONE) 1 MG tablet Take 1 mg by mouth daily with breakfast.  ?  rosuvastatin (CRESTOR) 10 MG tablet Take 1 tablet (10 mg total) by mouth daily.  ? silodosin (RAPAFLO) 8 MG CAPS capsule Take 1 capsule (8 mg total) by mouth at bedtime.  ? triamcinolone cream (KENALOG) 0.1 % Apply 1 application. topically 2 (two) times daily.  ? [DISCONTINUED] nitrofurantoin, macrocrystal-monohydrate, (MACROBID) 100 MG capsule Take 1 capsule (100 mg total) by mouth every 12 (twelve) hours.  ?  ? ?Allergies:   Ciprofloxacin and Oxycodone-acetaminophen  ? ?ROS:   ?Please see the history of present illness.    ? ?All other systems reviewed and are negative. ? ? ?Labs/Other Tests and Data Reviewed:   ? ?Recent Labs: ?12/28/2020: Hemoglobin 13.5; Platelets 208 ?02/24/2021: ALT 28; BUN 26; Creatinine, Ser 1.36; Potassium 4.2; Sodium 136; TSH 5.170  ? ?Recent Lipid Panel ?Lab Results  ?Component Value Date/Time  ? CHOL 217 (H) 10/23/2020 09:08 AM  ? TRIG 140 10/23/2020 09:08 AM  ? HDL 53 10/23/2020 09:08 AM  ? CHOLHDL 4.1 10/23/2020 09:08 AM  ? CHOLHDL 2.9 Ratio 07/16/2007 10:28 PM  ? LDLCALC 139 (H) 10/23/2020 09:08 AM  ? ? ?Wt Readings from Last 3 Encounters:  ?08/30/21 228 lb 6.4 oz (103.6 kg)  ?07/08/21 229 lb 8 oz (104.1 kg)  ?06/28/21 230 lb 6.4 oz (104.5 kg)  ?  ? ?ASSESSMENT & PLAN:   ? ?Acute gastroenteritis ?Started empiric Azithromycin as he has persistent symptoms ?Advised to maintain adequate hydration ?Avoid skipping meals ?If persistent, will check stool GI profile ? ?Time:   ?Today, I have spent 9 minutes reviewing the chart, including problem list, medications, and with the patient with telehealth technology discussing the above problems. ? ? ?Medication Adjustments/Labs and Tests Ordered: ?Current medicines are reviewed at length with the patient today.  Concerns regarding medicines are outlined above.  ? ?Tests Ordered: ?No orders of the defined types were placed in this encounter. ? ? ?Medication Changes: ?No orders of the defined types were placed in this encounter. ? ? ? ?Note: This  dictation was prepared with Dragon dictation along with smaller phrase technology. Similar sounding words can be transcribed inadequately or may not be corrected upon review. Any transcriptional errors that result from this process are unintentional.  ?  ? ? ?Disposition:  Follow up  ?Signed, ?[name], MD  ?09/07/2021 12:06 PM    ? ?[facility] ?[facility] ?

## 2021-09-08 ENCOUNTER — Telehealth: Payer: Self-pay | Admitting: Internal Medicine

## 2021-09-08 NOTE — Telephone Encounter (Signed)
Patient called in regard to  azithromycin ? ?Pharm has not received med  ?

## 2021-09-08 NOTE — Telephone Encounter (Signed)
Pt notified this was sent to [facility] in [place] he was checking in [place] ?

## 2021-09-17 ENCOUNTER — Ambulatory Visit (HOSPITAL_COMMUNITY)
Admission: RE | Admit: 2021-09-17 | Discharge: 2021-09-17 | Disposition: A | Discharge status: Home / Self Care | Payer: Medicare Other | Source: Ambulatory Visit | Attending: Family Medicine | Admitting: Family Medicine

## 2021-09-17 ENCOUNTER — Ambulatory Visit (INDEPENDENT_AMBULATORY_CARE_PROVIDER_SITE_OTHER): Payer: Medicare Other | Admitting: Family Medicine

## 2021-09-17 ENCOUNTER — Encounter: Payer: Self-pay | Admitting: Family Medicine

## 2021-09-17 VITALS — BP 136/81 | HR 80 | Ht 69.0 in | Wt 227.0 lb

## 2021-09-17 DIAGNOSIS — M25511 Pain in right shoulder: Secondary | ICD-10-CM | POA: Insufficient documentation

## 2021-09-17 DIAGNOSIS — I10 Essential (primary) hypertension: Secondary | ICD-10-CM

## 2021-09-17 NOTE — Assessment & Plan Note (Addendum)
Injury on 5/18/20223, pain and limited mobility, x ray to further eval, supportive management only, f/U with PCP X ray negative for fracture , shows mild Arthritis, will not refer to PT, PCP to follow

## 2021-09-17 NOTE — Progress Notes (Signed)
   Jonathan [name]     MRN: [id number]      DOB: [birth year]   HPI Jonathan [name] is here wkith direct trauma to right shoulder on 5/18 when he fell off his riding mower. Pain is a 7 , movement is markedly limited  ROS See HPI  Denies recent fever or chills. Denies sinus pressure, nasal congestion, ear pain or sore throat. Denies chest congestion, productive cough or wheezing. Denies chest pains, palpitations and leg swelling Denies abdominal pain, nausea, vomiting,diarrhea or constipation.   . Denies skin break down or rash.   PE  BP 136/81   Pulse 80   Ht '5\' 9"'$  (1.753 m)   Wt 227 lb 0.6 oz (103 kg)   SpO2 97%   BMI 33.53 kg/m   Patient alert and oriented and in no cardiopulmonary distress.Patient in pain  HEENT: No facial asymmetry, EOMI,     Neck supple .  Chest: Clear to auscultation bilaterally.  CVS: S1, S2 no murmurs, no S3.Regular rate.  ABD: Soft non tender.   Ext: No edema  MS: Adequate ROM spine, reduced ROM right shoulders s.  Skin: Intact, no ulcerations or rash noted.No bruising  Psych: Good eye contact, normal affect. Memory intact not anxious or depressed appearing.  CNS: CN 2-12 intact, power,  normal throughout.no focal deficits noted.   Assessment & Plan  Acute pain of right shoulder Injury on 5/18/20223, pain and limited mobility, x ray to further eval, supportive management only, f/U with PCP X ray negative for fracture , shows mild Arthritis, will not refer to PT, PCP to follow  Essential hypertension Controlled, no change in medication

## 2021-09-17 NOTE — Patient Instructions (Signed)
F/U with Dr Posey Pronto in 1 to 2 weeks, re evaluate shoulder pain  X ray of shoulder today, we will call this afternoon with result  Take tylenol 1 tablet up to 3 times daily for pain for next 5 to 7 days, alternate heat with cool, as best tolerated for pain and topical rub  If no fracture you will  be referred to PT, if fracture, Orthopedics will evalute  .Thanks for choosing [facility], we consider it a privelige to serve you.

## 2021-09-19 NOTE — Assessment & Plan Note (Signed)
Controlled, no change in medication  

## 2021-09-29 ENCOUNTER — Ambulatory Visit (INDEPENDENT_AMBULATORY_CARE_PROVIDER_SITE_OTHER): Payer: Self-pay | Admitting: *Deleted

## 2021-09-29 VITALS — Ht 69.0 in | Wt 227.0 lb

## 2021-09-29 DIAGNOSIS — R195 Other fecal abnormalities: Secondary | ICD-10-CM

## 2021-09-29 DIAGNOSIS — Z1211 Encounter for screening for malignant neoplasm of colon: Secondary | ICD-10-CM

## 2021-09-29 NOTE — Progress Notes (Signed)
Gastroenterology Pre-Procedure Review  Request Date: 09/29/2021 Requesting Physician: Dr. [name] @ [facility], Last TCS done 15 years ago in [place], normal colon per pt  PATIENT REVIEW QUESTIONS: The patient responded to the following health history questions as indicated:    1. Diabetes Melitis: yes, type II  2. Joint replacements in the past 12 months: no 3. Major health problems in the past 3 months: no 4. Has an artificial valve or MVP: no 5. Has a defibrillator: no 6. Has been advised in past to take antibiotics in advance of a procedure like teeth cleaning: no 7. Family history of colon cancer: no  8. Alcohol Use: no 9. Illicit drug Use: no 10. History of sleep apnea: no  11. History of coronary artery or other vascular stents placed within the last 12 months: no 12. History of any prior anesthesia complications: no 13. Body mass index is 33.52 kg/m.    MEDICATIONS & ALLERGIES:    Patient reports the following regarding taking any blood thinners:   Plavix? no Aspirin? Yes, 81 mg daily Coumadin? no Brilinta? no Xarelto? no Eliquis? no Pradaxa? no Savaysa? no Effient? no  Patient confirms/reports the following medications:  Current Outpatient Medications  Medication Sig Dispense Refill   amLODipine (NORVASC) 10 MG tablet Take 1 tablet (10 mg total) by mouth daily. 90 tablet 1   aspirin EC 81 MG tablet Take 81 mg by mouth daily. Swallow whole.     Cyanocobalamin 2500 MCG CHEW Chew by mouth daily.     finasteride (PROSCAR) 5 MG tablet Take 1 tablet (5 mg total) by mouth daily. 90 tablet 1   folic acid (FOLVITE) 1 MG tablet Take 1 mg by mouth daily.     glipiZIDE (GLUCOTROL) 5 MG tablet Take 1 tablet (5 mg total) by mouth 2 (two) times daily before a meal. (Patient taking differently: Take 5 mg by mouth daily at 6 (six) AM.) 30 tablet 0   losartan-hydrochlorothiazide (HYZAAR) 100-25 MG tablet Take 1 tablet by mouth daily. 90 tablet 1   methotrexate  (RHEUMATREX) 2.5 MG tablet Caution:Chemotherapy. Protect from light. Take 5 tablets PO once weekly.     metoprolol tartrate (LOPRESSOR) 50 MG tablet Take 1 tablet (50 mg total) by mouth 2 (two) times daily. 180 tablet 1   omeprazole (PRILOSEC) 20 MG capsule Take 1 capsule (20 mg total) by mouth daily. 30 capsule 0   predniSONE (DELTASONE) 1 MG tablet Take 1 mg by mouth daily with breakfast.     rosuvastatin (CRESTOR) 10 MG tablet Take 1 tablet (10 mg total) by mouth daily. 90 tablet 1   silodosin (RAPAFLO) 8 MG CAPS capsule Take 1 capsule (8 mg total) by mouth at bedtime. 90 capsule 1   UNABLE TO FIND 2 tablets daily at 6 (six) AM. Med Name: Omega XL     No current facility-administered medications for this visit.    Patient confirms/reports the following allergies:  Allergies  Allergen Reactions   Ciprofloxacin    Oxycodone-Acetaminophen Rash    No orders of the defined types were placed in this encounter.   AUTHORIZATION INFORMATION Primary Insurance: [facility] Medicare,  ID #: [id number],  Group #: [id number] Pre-Cert / Auth required: Yes, approved online [id number] Pre-Cert / Josem Kaufmann #: [id number]  SCHEDULE INFORMATION: Procedure has been scheduled as follows:  Date: 10/26/2021, Time: 2:30 Location: [facility] with Dr. Abbey Chatters  This Gastroenterology Pre-Precedure Review Form is being routed to the following provider(s): Aliene Altes, PA-C

## 2021-09-30 NOTE — Progress Notes (Signed)
Okay to schedule.  ASA 2.  Needs BMP at preop.  1 day prior to procedure: Take glipizide as prescribed.  Day of procedure: Do not take any morning diabetes medications.

## 2021-10-01 ENCOUNTER — Encounter: Payer: Self-pay | Admitting: *Deleted

## 2021-10-01 ENCOUNTER — Other Ambulatory Visit: Payer: Self-pay | Admitting: *Deleted

## 2021-10-01 DIAGNOSIS — Z1211 Encounter for screening for malignant neoplasm of colon: Secondary | ICD-10-CM

## 2021-10-01 MED ORDER — PEG 3350-KCL-NA BICARB-NACL 420 G PO SOLR: 4000.0000 mL | Freq: Once | ORAL | 0 refills | Status: AC

## 2021-10-01 NOTE — Progress Notes (Signed)
Spoke to pt.  Scheduled procedure for 10/26/2021 at 2:30, arrival 1:00 at [facility].  Reviewed prep instructions with pt by phone. Pt made aware that I sent his RX to [facility].  Informed him that he would need OTC items as well (enema and dulcolax).  Pt informed of how to adjust diabetes medication.  Pt aware to have labs drawn on 10/22/2021 at [facility].  Confirmed mailing address and mailed instructions.

## 2021-10-01 NOTE — Addendum Note (Signed)
Addended by: Metro Kung on: 10/01/2021 09:01 AM   Modules accepted: Orders

## 2021-10-04 ENCOUNTER — Ambulatory Visit (INDEPENDENT_AMBULATORY_CARE_PROVIDER_SITE_OTHER): Payer: Medicare Other | Admitting: Internal Medicine

## 2021-10-04 ENCOUNTER — Encounter: Payer: Self-pay | Admitting: Internal Medicine

## 2021-10-04 VITALS — BP 132/78 | HR 60 | Ht 69.0 in | Wt 225.0 lb

## 2021-10-04 DIAGNOSIS — M25511 Pain in right shoulder: Secondary | ICD-10-CM | POA: Diagnosis not present

## 2021-10-04 DIAGNOSIS — E1142 Type 2 diabetes mellitus with diabetic polyneuropathy: Secondary | ICD-10-CM | POA: Diagnosis not present

## 2021-10-04 MED ORDER — GABAPENTIN 100 MG PO CAPS: 100.0000 mg | ORAL_CAPSULE | Freq: Every day | ORAL | 3 refills | Status: DC

## 2021-10-04 NOTE — Progress Notes (Signed)
Acute Office Visit  Subjective:    Patient ID: Jonathan [name], male    DOB: 02-06-1946, 76 y.o.   MRN: [id number]  Chief Complaint  Patient presents with   Shoulder Pain    2 week f/u     HPI Patient is in today for follow up of right shoulder pain, which started about 2 weeks ago after a mechanical fall.  He is shoulder pain has resolved now with ROM at baseline now.  He denies any numbness or tingling of the UE.  He complains of chronic b/l foot pain, pins and needle like sensation.  He denies any recent injury.  Of note, he has history of type II DM.  He has seen podiatrist and has been using diabetic shoes with some relief.  Past Medical History:  Diagnosis Date   Arthritis    ra, sees dr Sedonia Small   Coronary artery disease    saw dr zachery in [place] 20 yrs ago, released from cardiology   Diabetes mellitus without complication ([place])    type 2   Hypertension    Urethral stricture     Past Surgical History:  Procedure Laterality Date   BACK SURGERY     x 3 lower back   CARDIAC CATHETERIZATION  2000   small amt plaque relased from cardiology 20 yrs ago   [place] N/A 04/17/2019   Procedure: CYSTOSCOPY WITH URETHRAL DILATATION, CYSTOGRAM;  Surgeon: Ceasar Mons, MD;  Location: [facility];  Service: Urology;  Laterality: N/A;   EYE SURGERY Bilateral    cataracts   IR RADIOLOGIST EVAL & MGMT  07/12/2021   urethral stricture surgery  15 yrs ago    Family History  Problem Relation Age of Onset   Heart Problems Father     Social History   Socioeconomic History   Marital status: Married    Spouse name: Not on file   Number of children: Not on file   Years of education: Not on file   Highest education level: Not on file  Occupational History   Not on file  Tobacco Use   Smoking status: Never   Smokeless tobacco: Never  Vaping Use   Vaping Use: Never used  Substance and  Sexual Activity   Alcohol use: No   Drug use: No   Sexual activity: Not on file  Other Topics Concern   Not on file  Social History Narrative   Not on file   Social Determinants of Health   Financial Resource Strain: Low Risk    Difficulty of Paying Living Expenses: Not hard at all  Food Insecurity: No Food Insecurity   Worried About Charity fundraiser in the Last Year: Never true   Ran Out of Food in the Last Year: Never true  Transportation Needs: No Transportation Needs   Lack of Transportation (Medical): No   Lack of Transportation (Non-Medical): No  Physical Activity: Insufficiently Active   Days of Exercise per Week: 3 days   Minutes of Exercise per Session: 30 min  Stress: No Stress Concern Present   Feeling of Stress : Not at all  Social Connections: Moderately Integrated   Frequency of Communication with Friends and Family: More than three times a week   Frequency of Social Gatherings with Friends and Family: More than three times a week   Attends Religious Services: More than 4 times per year   Active Member of  Clubs or Organizations: No   Attends Archivist Meetings: Never   Marital Status: Married  Human resources officer Violence: Not At Risk   Fear of Current or Ex-Partner: No   Emotionally Abused: No   Physically Abused: No   Sexually Abused: No    Outpatient Medications Prior to Visit  Medication Sig Dispense Refill   amLODipine (NORVASC) 10 MG tablet Take 1 tablet (10 mg total) by mouth daily. 90 tablet 1   aspirin EC 81 MG tablet Take 81 mg by mouth daily. Swallow whole.     Cyanocobalamin 2500 MCG CHEW Chew by mouth daily.     finasteride (PROSCAR) 5 MG tablet Take 1 tablet (5 mg total) by mouth daily. 90 tablet 1   folic acid (FOLVITE) 1 MG tablet Take 1 mg by mouth daily.     glipiZIDE (GLUCOTROL) 5 MG tablet Take 1 tablet (5 mg total) by mouth 2 (two) times daily before a meal. (Patient taking differently: Take 5 mg by mouth daily at 6 (six) AM.)  30 tablet 0   losartan-hydrochlorothiazide (HYZAAR) 100-25 MG tablet Take 1 tablet by mouth daily. 90 tablet 1   methotrexate (RHEUMATREX) 2.5 MG tablet Caution:Chemotherapy. Protect from light. Take 5 tablets PO once weekly.     metoprolol tartrate (LOPRESSOR) 50 MG tablet Take 1 tablet (50 mg total) by mouth 2 (two) times daily. 180 tablet 1   omeprazole (PRILOSEC) 20 MG capsule Take 1 capsule (20 mg total) by mouth daily. 30 capsule 0   predniSONE (DELTASONE) 1 MG tablet Take 1 mg by mouth daily with breakfast.     rosuvastatin (CRESTOR) 10 MG tablet Take 1 tablet (10 mg total) by mouth daily. 90 tablet 1   silodosin (RAPAFLO) 8 MG CAPS capsule Take 1 capsule (8 mg total) by mouth at bedtime. 90 capsule 1   UNABLE TO FIND 2 tablets daily at 6 (six) AM. Med Name: Omega XL (Patient not taking: Reported on 10/04/2021)     No facility-administered medications prior to visit.    Allergies  Allergen Reactions   Ciprofloxacin    Oxycodone-Acetaminophen Rash    Review of Systems  Constitutional:  Negative for chills and fever.  HENT:  Negative for congestion and sore throat.   Eyes:  Negative for pain and discharge.  Respiratory:  Negative for cough and shortness of breath.   Cardiovascular:  Negative for chest pain and palpitations.  Gastrointestinal:  Negative for constipation, diarrhea, nausea and vomiting.  Endocrine: Negative for polydipsia and polyuria.  Genitourinary:  Negative for dysuria and hematuria.  Musculoskeletal:  Positive for arthralgias and back pain. Negative for neck pain and neck stiffness.  Skin:  Negative for rash.  Neurological:  Negative for dizziness, weakness and headaches.  Psychiatric/Behavioral:  Negative for agitation and behavioral problems.       Objective:    Physical Exam Vitals reviewed.  Constitutional:      General: He is not in acute distress.    Appearance: He is obese. He is not diaphoretic.  HENT:     Head: Normocephalic and atraumatic.      Nose: Nose normal.     Mouth/Throat:     Mouth: Mucous membranes are moist.  Eyes:     General: No scleral icterus.    Extraocular Movements: Extraocular movements intact.  Cardiovascular:     Rate and Rhythm: Normal rate and regular rhythm.     Pulses: Normal pulses.     Heart sounds: Normal heart sounds. No  murmur heard. Pulmonary:     Breath sounds: Normal breath sounds. No wheezing or rales.  Musculoskeletal:     Cervical back: Neck supple. No tenderness.     Right lower leg: No edema.     Left lower leg: No edema.  Skin:    General: Skin is warm.     Findings: No rash.  Neurological:     General: No focal deficit present.     Mental Status: He is alert and oriented to person, place, and time.     Sensory: No sensory deficit.     Motor: No weakness.  Psychiatric:        Mood and Affect: Mood normal.        Behavior: Behavior normal.    BP 132/78 (BP Location: Right Arm, Patient Position: Sitting, Cuff Size: Normal)   Pulse 60   Ht '5\' 9"'$  (1.753 m)   Wt 225 lb (102.1 kg)   SpO2 98%   BMI 33.23 kg/m  Wt Readings from Last 3 Encounters:  10/04/21 225 lb (102.1 kg)  09/29/21 227 lb (103 kg)  09/17/21 227 lb 0.6 oz (103 kg)        Assessment & Plan:   Problem List Items Addressed This Visit       Other   Acute pain of right shoulder - Primary Resolved now Takes prednisone for history of RA Advised to use warm compresses for neck pain   Other Visit Diagnoses     Diabetic polyneuropathy associated with type 2 diabetes mellitus ([place])     His foot pain is likely due to diabetic neuropathy in addition to RA and OA Started Gabapentin 100 mg qHS   Relevant Medications   gabapentin (NEURONTIN) 100 MG capsule        Meds ordered this encounter  Medications   gabapentin (NEURONTIN) 100 MG capsule    Sig: Take 1 capsule (100 mg total) by mouth at bedtime.    Dispense:  30 capsule    Refill:  3     Liliann File Keith Rake, MD

## 2021-10-04 NOTE — Patient Instructions (Signed)
Please start taking Gabapentin for foot pain.  Please contact your GI office at [phone] to get colonoscopy instructions.  [address], [place], [place] [address]

## 2021-10-05 ENCOUNTER — Other Ambulatory Visit: Payer: Self-pay | Admitting: Internal Medicine

## 2021-10-08 ENCOUNTER — Other Ambulatory Visit: Payer: Self-pay | Admitting: Internal Medicine

## 2021-10-14 ENCOUNTER — Other Ambulatory Visit: Payer: Self-pay | Admitting: Interventional Radiology

## 2021-10-14 DIAGNOSIS — I739 Peripheral vascular disease, unspecified: Secondary | ICD-10-CM

## 2021-10-21 ENCOUNTER — Other Ambulatory Visit: Payer: Self-pay | Admitting: *Deleted

## 2021-10-21 ENCOUNTER — Telehealth: Payer: Self-pay | Admitting: Internal Medicine

## 2021-10-21 MED ORDER — OMEPRAZOLE 20 MG PO CPDR: 20.0000 mg | DELAYED_RELEASE_CAPSULE | Freq: Every day | ORAL | 0 refills | Status: DC

## 2021-10-21 NOTE — Telephone Encounter (Signed)
Pt medication sent to pharmacy  

## 2021-10-21 NOTE — Telephone Encounter (Signed)
Pt called stating he is needing a refill on Omeprazole '20mg'$ , can you please refill?    [place]

## 2021-10-25 ENCOUNTER — Other Ambulatory Visit (HOSPITAL_COMMUNITY)
Admission: RE | Admit: 2021-10-25 | Discharge: 2021-10-25 | Disposition: A | Discharge status: Home / Self Care | Payer: Medicare Other | Attending: Internal Medicine | Admitting: Internal Medicine

## 2021-10-25 DIAGNOSIS — Z1211 Encounter for screening for malignant neoplasm of colon: Secondary | ICD-10-CM | POA: Diagnosis present

## 2021-10-25 LAB — BASIC METABOLIC PANEL
Anion gap: 5 (ref 5–15)
BUN: 20 mg/dL (ref 8–23)
CO2: 24 mmol/L (ref 22–32)
Calcium: 8.8 mg/dL — ABNORMAL LOW (ref 8.9–10.3)
Chloride: 108 mmol/L (ref 98–111)
Creatinine, Ser: 1.45 mg/dL — ABNORMAL HIGH (ref 0.61–1.24)
GFR, Estimated: 51 mL/min — ABNORMAL LOW (ref >60)
Glucose, Bld: 82 mg/dL (ref 70–99)
Potassium: 3.8 mmol/L (ref 3.5–5.1)
Sodium: 137 mmol/L (ref 135–145)

## 2021-10-26 ENCOUNTER — Ambulatory Visit (HOSPITAL_BASED_OUTPATIENT_CLINIC_OR_DEPARTMENT_OTHER): Payer: Medicare Other | Admitting: Certified Registered Nurse Anesthetist

## 2021-10-26 ENCOUNTER — Encounter (HOSPITAL_COMMUNITY)
Admission: RE | Disposition: A | Discharge status: Home / Self Care | Payer: Self-pay | Source: Ambulatory Visit | Attending: Internal Medicine

## 2021-10-26 ENCOUNTER — Encounter (HOSPITAL_COMMUNITY): Payer: Self-pay

## 2021-10-26 ENCOUNTER — Telehealth: Payer: Self-pay | Admitting: *Deleted

## 2021-10-26 ENCOUNTER — Ambulatory Visit (HOSPITAL_COMMUNITY)
Admission: RE | Admit: 2021-10-26 | Discharge: 2021-10-26 | Disposition: A | Discharge status: Home / Self Care | Payer: Medicare Other | Source: Ambulatory Visit | Attending: Internal Medicine | Admitting: Internal Medicine

## 2021-10-26 ENCOUNTER — Ambulatory Visit (HOSPITAL_COMMUNITY): Payer: Medicare Other | Admitting: Certified Registered Nurse Anesthetist

## 2021-10-26 ENCOUNTER — Other Ambulatory Visit: Payer: Self-pay

## 2021-10-26 DIAGNOSIS — D123 Benign neoplasm of transverse colon: Secondary | ICD-10-CM | POA: Insufficient documentation

## 2021-10-26 DIAGNOSIS — E119 Type 2 diabetes mellitus without complications: Secondary | ICD-10-CM | POA: Diagnosis not present

## 2021-10-26 DIAGNOSIS — K5790 Diverticulosis of intestine, part unspecified, without perforation or abscess without bleeding: Secondary | ICD-10-CM | POA: Diagnosis not present

## 2021-10-26 DIAGNOSIS — K5730 Diverticulosis of large intestine without perforation or abscess without bleeding: Secondary | ICD-10-CM | POA: Diagnosis not present

## 2021-10-26 DIAGNOSIS — K219 Gastro-esophageal reflux disease without esophagitis: Secondary | ICD-10-CM | POA: Diagnosis not present

## 2021-10-26 DIAGNOSIS — K648 Other hemorrhoids: Secondary | ICD-10-CM | POA: Diagnosis not present

## 2021-10-26 DIAGNOSIS — K635 Polyp of colon: Secondary | ICD-10-CM | POA: Diagnosis not present

## 2021-10-26 DIAGNOSIS — D122 Benign neoplasm of ascending colon: Secondary | ICD-10-CM | POA: Diagnosis not present

## 2021-10-26 DIAGNOSIS — I10 Essential (primary) hypertension: Secondary | ICD-10-CM | POA: Diagnosis not present

## 2021-10-26 DIAGNOSIS — Z7984 Long term (current) use of oral hypoglycemic drugs: Secondary | ICD-10-CM | POA: Diagnosis not present

## 2021-10-26 DIAGNOSIS — Z1211 Encounter for screening for malignant neoplasm of colon: Secondary | ICD-10-CM | POA: Insufficient documentation

## 2021-10-26 HISTORY — PX: COLONOSCOPY WITH PROPOFOL: SHX5780

## 2021-10-26 HISTORY — DX: Pure hypercholesterolemia, unspecified: E78.00

## 2021-10-26 HISTORY — DX: Gastro-esophageal reflux disease without esophagitis: K21.9

## 2021-10-26 HISTORY — PX: POLYPECTOMY: SHX149

## 2021-10-26 LAB — GLUCOSE, CAPILLARY: Glucose-Capillary: 128 mg/dL — ABNORMAL HIGH (ref 70–99)

## 2021-10-26 SURGERY — COLONOSCOPY WITH PROPOFOL
Anesthesia: General

## 2021-10-26 MED ORDER — LACTATED RINGERS IV SOLN: INTRAVENOUS | Status: DC | PRN

## 2021-10-26 MED ORDER — PROPOFOL 10 MG/ML IV BOLUS
INTRAVENOUS | Status: DC | PRN
  Administered 2021-10-26: 100 mg via INTRAVENOUS
  Administered 2021-10-26 (×2): 30 mg via INTRAVENOUS
  Administered 2021-10-26: 40 mg via INTRAVENOUS

## 2021-10-26 NOTE — Anesthesia Preprocedure Evaluation (Signed)
Anesthesia Evaluation  Patient identified by MRN, date of birth, ID band Patient awake    Reviewed: Allergy & Precautions, H&P , NPO status , Patient's Chart, lab work & pertinent test results, reviewed documented beta blocker date and time   Airway Mallampati: II  TM Distance: >3 FB Neck ROM: full    Dental no notable dental hx.    Pulmonary neg pulmonary ROS,    Pulmonary exam normal breath sounds clear to auscultation       Cardiovascular Exercise Tolerance: Good hypertension, negative cardio ROS   Rhythm:regular Rate:Normal     Neuro/Psych  Neuromuscular disease negative psych ROS   GI/Hepatic Neg liver ROS, GERD  Medicated,  Endo/Other  diabetesHypothyroidism   Renal/GU CRFRenal disease  negative genitourinary   Musculoskeletal   Abdominal   Peds  Hematology negative hematology ROS (+)   Anesthesia Other Findings   Reproductive/Obstetrics negative OB ROS                             Anesthesia Physical Anesthesia Plan  ASA: 2  Anesthesia Plan: General   Post-op Pain Management:    Induction:   PONV Risk Score and Plan: Propofol infusion  Airway Management Planned:   Additional Equipment:   Intra-op Plan:   Post-operative Plan:   Informed Consent: I have reviewed the patients History and Physical, chart, labs and discussed the procedure including the risks, benefits and alternatives for the proposed anesthesia with the patient or authorized representative who has indicated his/her understanding and acceptance.     Dental Advisory Given  Plan Discussed with: CRNA  Anesthesia Plan Comments:         Anesthesia Quick Evaluation

## 2021-10-28 LAB — SURGICAL PATHOLOGY

## 2021-10-29 NOTE — Anesthesia Postprocedure Evaluation (Signed)
Anesthesia Post Note  Patient: Jonathan [name]  Procedure(s) Performed: COLONOSCOPY WITH PROPOFOL POLYPECTOMY INTESTINAL  Patient location during evaluation: Phase II Anesthesia Type: General Level of consciousness: awake Pain management: pain level controlled Vital Signs Assessment: post-procedure vital signs reviewed and stable Respiratory status: spontaneous breathing and respiratory function stable Cardiovascular status: blood pressure returned to baseline and stable Postop Assessment: no headache and no apparent nausea or vomiting Anesthetic complications: no Comments: Late entry   No notable events documented.   Last Vitals:  Vitals:   10/26/21 1223 10/26/21 1340  BP: (!) 174/77 99/62  Pulse: 69 67  Resp: 19 (!) 22  Temp: 36.7 C 36.4 C  SpO2: 100% 95%    Last Pain:  Vitals:   10/26/21 1340  TempSrc: Oral  PainSc: 0-No pain                 Louann Sjogren

## 2021-11-01 ENCOUNTER — Encounter (HOSPITAL_COMMUNITY): Payer: Self-pay | Admitting: Internal Medicine

## 2021-11-01 ENCOUNTER — Encounter: Payer: Medicare Other | Admitting: Internal Medicine

## 2021-11-08 ENCOUNTER — Other Ambulatory Visit: Payer: Self-pay | Admitting: Internal Medicine

## 2021-11-08 DIAGNOSIS — I10 Essential (primary) hypertension: Secondary | ICD-10-CM

## 2021-11-11 ENCOUNTER — Ambulatory Visit
Admission: RE | Admit: 2021-11-11 | Discharge: 2021-11-11 | Disposition: A | Discharge status: Home / Self Care | Payer: Medicare Other | Source: Ambulatory Visit | Attending: Interventional Radiology | Admitting: Interventional Radiology

## 2021-11-11 DIAGNOSIS — I739 Peripheral vascular disease, unspecified: Secondary | ICD-10-CM

## 2021-11-15 ENCOUNTER — Ambulatory Visit (INDEPENDENT_AMBULATORY_CARE_PROVIDER_SITE_OTHER): Payer: Medicare Other

## 2021-11-15 DIAGNOSIS — Z0000 Encounter for general adult medical examination without abnormal findings: Secondary | ICD-10-CM

## 2021-11-15 NOTE — Progress Notes (Signed)
Subjective:   Jonathan [name] is a 76 y.o. male who presents for Medicare Annual/Subsequent preventive examination.  Review of Systems    I connected with  Sargon L Shenefield on 11/15/21 by a audio enabled telemedicine application and verified that I am speaking with the correct person using two identifiers.  Patient Location: Home  Provider Location: Office/Clinic  I discussed the limitations of evaluation and management by telemedicine. The patient expressed understanding and agreed to proceed.  Cardiac Risk Factors include: none     Objective:    Today's Vitals   11/15/21 1508  PainSc: 0-No pain   There is no height or weight on file to calculate BMI.     11/15/2021    3:12 PM 10/26/2021   12:12 PM 11/12/2020    4:18 PM 04/17/2019    8:33 AM 12/09/2015   10:05 AM  Advanced Directives  Does Patient Have a Medical Advance Directive? No No No No No  Would patient like information on creating a medical advance directive? No - Patient declined No - Patient declined No - Patient declined No - Patient declined No - patient declined information    Current Medications (verified) Outpatient Encounter Medications as of 11/15/2021  Medication Sig   amLODipine (NORVASC) 10 MG tablet TAKE 1 TABLET BY MOUTH DAILY   aspirin EC 81 MG tablet Take 81 mg by mouth daily. Swallow whole.   Cyanocobalamin 2500 MCG CHEW Chew by mouth daily.   finasteride (PROSCAR) 5 MG tablet TAKE 1 TABLET BY MOUTH DAILY   folic acid (FOLVITE) 1 MG tablet Take 1 mg by mouth daily.   gabapentin (NEURONTIN) 100 MG capsule Take 1 capsule (100 mg total) by mouth at bedtime. (Patient taking differently: Take 100 mg by mouth daily as needed (pain).)   glipiZIDE (GLUCOTROL) 5 MG tablet Take 1 tablet (5 mg total) by mouth 2 (two) times daily before a meal. (Patient taking differently: Take 5 mg by mouth daily at 6 (six) AM.)   losartan-hydrochlorothiazide (HYZAAR) 100-25 MG tablet TAKE 1 TABLET BY MOUTH DAILY   Menthol,  Topical Analgesic, (BLUE-EMU MAXIMUM STRENGTH EX) Apply 1 Application topically daily as needed (pain).   methotrexate (RHEUMATREX) 2.5 MG tablet Take 12.5 mg by mouth every Sunday. Caution:Chemotherapy. Protect from light. Take 5 tablets PO once weekly.   metoprolol tartrate (LOPRESSOR) 50 MG tablet Take 1 tablet (50 mg total) by mouth 2 (two) times daily.   naphazoline-pheniramine (VISINE) 0.025-0.3 % ophthalmic solution Place 1 drop into both eyes daily as needed for eye irritation.   omeprazole (PRILOSEC) 20 MG capsule Take 1 capsule (20 mg total) by mouth daily.   predniSONE (DELTASONE) 1 MG tablet Take 1 mg by mouth daily with breakfast.   rosuvastatin (CRESTOR) 10 MG tablet Take 1 tablet (10 mg total) by mouth daily.   silodosin (RAPAFLO) 8 MG CAPS capsule Take 1 capsule (8 mg total) by mouth at bedtime.   triamcinolone cream (KENALOG) 0.1 % Apply 1 Application topically daily as needed (itching).   No facility-administered encounter medications on file as of 11/15/2021.    Allergies (verified) Ciprofloxacin, Mosquito (diagnostic), and Oxycodone-acetaminophen   History: Past Medical History:  Diagnosis Date   Arthritis    ra, sees dr Sedonia Small   Coronary artery disease    saw dr zachery in [place] 20 yrs ago, released from cardiology   Diabetes mellitus without complication ([place])    type 2   GERD (gastroesophageal reflux disease)    Hypercholesteremia  Hypertension    Urethral stricture    Past Surgical History:  Procedure Laterality Date   BACK SURGERY     x 3 lower back   CARDIAC CATHETERIZATION  2000   small amt plaque relased from cardiology 20 yrs ago   CHOLECYSTECTOMY     COLONOSCOPY WITH PROPOFOL N/A 10/26/2021   Procedure: COLONOSCOPY WITH PROPOFOL;  Surgeon: Eloise Harman, DO;  [facility];  Service: Endoscopy;  Laterality: N/A;  To arrive at [place] N/A 04/17/2019   Procedure: CYSTOSCOPY WITH URETHRAL  DILATATION, CYSTOGRAM;  Surgeon: Ceasar Mons, MD;  Location: [facility];  Service: Urology;  Laterality: N/A;   EYE SURGERY Bilateral    cataracts   IR RADIOLOGIST EVAL & MGMT  07/12/2021   POLYPECTOMY  10/26/2021   Procedure: POLYPECTOMY INTESTINAL;  Surgeon: Eloise Harman, DO;  Location: [facility];  Service: Endoscopy;;   urethral stricture surgery  15 yrs ago   Family History  Problem Relation Age of Onset   Heart Problems Father    Social History   Socioeconomic History   Marital status: Married    Spouse name: Not on file   Number of children: Not on file   Years of education: Not on file   Highest education level: Not on file  Occupational History   Not on file  Tobacco Use   Smoking status: Never   Smokeless tobacco: Never  Vaping Use   Vaping Use: Never used  Substance and Sexual Activity   Alcohol use: No   Drug use: No   Sexual activity: Not on file  Other Topics Concern   Not on file  Social History Narrative   Not on file   Social Determinants of Health   Financial Resource Strain: Low Risk  (11/15/2021)   Overall Financial Resource Strain (CARDIA)    Difficulty of Paying Living Expenses: Not hard at all  Food Insecurity: No Food Insecurity (11/15/2021)   Hunger Vital Sign    Worried About Running Out of Food in the Last Year: Never true    Ran Out of Food in the Last Year: Never true  Transportation Needs: No Transportation Needs (11/15/2021)   PRAPARE - Hydrologist (Medical): No    Lack of Transportation (Non-Medical): No  Physical Activity: Sufficiently Active (11/15/2021)   Exercise Vital Sign    Days of Exercise per Week: 5 days    Minutes of Exercise per Session: 30 min  Stress: No Stress Concern Present (11/15/2021)   [place]    Feeling of Stress : Not at all  Social Connections: Moderately Integrated (11/15/2021)    Social Connection and Isolation Panel [NHANES]    Frequency of Communication with Friends and Family: Three times a week    Frequency of Social Gatherings with Friends and Family: Twice a week    Attends Religious Services: More than 4 times per year    Active Member of [employer] or Organizations: No    Attends Music therapist: Never    Marital Status: Married    Tobacco Counseling Counseling given: Not Answered   Clinical Intake:  Pre-visit preparation completed: Yes  Pain : No/denies pain Pain Score: 0-No pain     BMI - recorded: 33.21 Nutritional Status: BMI > 30  Obese Nutritional Risks: None Diabetes: Yes CBG done?: No Did pt. bring in CBG monitor from  home?: No  How often do you need to have someone help you when you read instructions, pamphlets, or other written materials from your doctor or pharmacy?: 1 - Never What is the last grade level you completed in school?: 11  Diabetic? Yes  Nutrition Risk Assessment:  Has the patient had any N/V/D within the last 2 months?  No  Does the patient have any non-healing wounds?  No  Has the patient had any unintentional weight loss or weight gain?  No   Diabetes:  Is the patient diabetic?  Yes  If diabetic, was a CBG obtained today?  No  Did the patient bring in their glucometer from home?  No  How often do you monitor your CBG's? 3 days per week.   Financial Strains and Diabetes Management:  Are you having any financial strains with the device, your supplies or your medication? No .  Does the patient want to be seen by Chronic Care Management for management of their diabetes?  No  Would the patient like to be referred to a Nutritionist or for Diabetic Management?  No   Diabetic Exams:  Diabetic Eye Exam: Completed 02/02/21    Interpreter Needed?: No      Activities of Daily Living    11/15/2021    3:12 PM  In your present state of health, do you have any difficulty performing the following  activities:  Hearing? 0  Vision? 0  Difficulty concentrating or making decisions? 0  Walking or climbing stairs? 1  Dressing or bathing? 0  Doing errands, shopping? 0  Preparing Food and eating ? N  Using the Toilet? N  In the past six months, have you accidently leaked urine? N  Do you have problems with loss of bowel control? N  Managing your Medications? N  Managing your Finances? N  Housekeeping or managing your Housekeeping? N    Patient Care Team: [name], MD as PCP - General (Internal Medicine)  Indicate any recent Medical Services you may have received from other than [facility] providers in the past year (date may be approximate).     Assessment:   This is a routine wellness examination for [place].  Hearing/Vision screen No results found.  Dietary issues and exercise activities discussed: Current Exercise Habits: Home exercise routine, Type of exercise: walking, Time (Minutes): 30, Frequency (Times/Week): 5, Weekly Exercise (Minutes/Week): 150, Intensity: Mild, Exercise limited by: None identified   Goals Addressed             This Visit's Progress    Patient Stated       None at this time.      Depression Screen    11/15/2021    3:12 PM 11/15/2021    3:10 PM 10/04/2021    2:18 PM 09/17/2021   10:20 AM 09/07/2021   11:24 AM 08/30/2021    3:50 PM 06/28/2021    9:57 AM  PHQ 2/9 Scores  PHQ - 2 Score 0 0 0 0 0 0 0    Fall Risk    11/15/2021    3:12 PM 10/04/2021    2:18 PM 09/17/2021   10:20 AM 09/07/2021   11:24 AM 08/30/2021    3:50 PM  [place] in the past year? 0 1 1 0 0  Number falls in past yr: 0 0 0 0 0  Injury with Fall? 0 1 1 0 0  Risk for fall due to : No Fall Risks History of fall(s)  History of fall(s);Impaired balance/gait No Fall Risks No Fall Risks  Follow up Falls evaluation completed Falls evaluation completed Falls evaluation completed Falls evaluation completed Falls evaluation completed    [place]:  Any stairs in or around the home? No  If so, are there any without handrails?  N/a Home free of loose throw rugs in walkways, pet beds, electrical cords, etc? Yes  Adequate lighting in your home to reduce risk of falls? Yes   ASSISTIVE DEVICES UTILIZED TO PREVENT FALLS:  Life alert? No  Use of a cane, walker or w/c? Yes ,cane Grab bars in the bathroom? Yes  Shower chair or bench in shower? No  Elevated toilet seat or a handicapped toilet? Yes    Cognitive Function:    11/15/2021    3:13 PM 11/12/2020    4:20 PM  MMSE - Mini Mental State Exam  Not completed: Unable to complete Unable to complete        11/15/2021    3:13 PM 11/12/2020    4:20 PM  6CIT Screen  What Year? 0 points 0 points  What month? 0 points 0 points  What time? 0 points 0 points  Count back from 20 0 points 0 points  Months in reverse 0 points 4 points  Repeat phrase 0 points 0 points  Total Score 0 points 4 points    Immunizations Immunization History  Administered Date(s) Administered   Fluad Quad(high Dose 65+) 03/02/2021   Influenza Whole 02/21/2006, 04/16/2007, 02/15/2008   Influenza, High Dose Seasonal PF 01/28/2019   Influenza-Unspecified 01/30/2018   PFIZER(Purple Top)SARS-COV-2 Vaccination 07/04/2019, 07/31/2019    TDAP status: Due, Education has been provided regarding the importance of this vaccine. Advised may receive this vaccine at local pharmacy or Health Dept. Aware to provide a copy of the vaccination record if obtained from local pharmacy or Health Dept. Verbalized acceptance and understanding.  Flu Vaccine status: Up to date  Pneumococcal vaccine status: Due, Education has been provided regarding the importance of this vaccine. Advised may receive this vaccine at local pharmacy or Health Dept. Aware to provide a copy of the vaccination record if obtained from local pharmacy or Health Dept. Verbalized acceptance and understanding.  Covid-19 vaccine status: Completed  vaccines  Qualifies for Shingles Vaccine? Yes   Zostavax completed No   Shingrix Completed?: No.    Education has been provided regarding the importance of this vaccine. Patient has been advised to call insurance company to determine out of pocket expense if they have not yet received this vaccine. Advised may also receive vaccine at local pharmacy or Health Dept. Verbalized acceptance and understanding.  Screening Tests Health Maintenance  Topic Date Due   TETANUS/TDAP  Never done   Zoster Vaccines- Shingrix (1 of 2) Never done   Pneumonia Vaccine 38+ Years old (1 - PCV) Never done   COVID-19 Vaccine (3 - Pfizer series) 09/25/2019   FOOT EXAM  09/24/2021   INFLUENZA VACCINE  11/30/2021   HEMOGLOBIN A1C  12/26/2021   OPHTHALMOLOGY EXAM  02/02/2022   Fecal DNA (Cologuard)  07/15/2024   Hepatitis C Screening  Completed   HPV VACCINES  Aged Out   COLONOSCOPY (Pts 45-42yr Insurance coverage will need to be confirmed)  Discontinued    Health Maintenance  Health Maintenance Due  Topic Date Due   TETANUS/TDAP  Never done   Zoster Vaccines- Shingrix (1 of 2) Never done   Pneumonia Vaccine 74 Years old (164-  PCV) Never done   COVID-19 Vaccine (3 - Pfizer series) 09/25/2019   FOOT EXAM  09/24/2021    Colorectal cancer screening: Type of screening: Cologuard. Completed 07/15/21. Repeat every 3 years  Lung Cancer Screening: (Low Dose CT Chest recommended if Age 71-80 years, 30 pack-year currently smoking OR have quit w/in 15years.) does not qualify.   Lung Cancer Screening Referral: NO  Additional Screening:  Hepatitis C Screening: does qualify; Completed 10/23/20  Vision Screening: Recommended annual ophthalmology exams for early detection of glaucoma and other disorders of the eye. Is the patient up to date with their annual eye exam?  Yes  Who is the provider or what is the name of the office in which the patient attends annual eye exams? N/a If pt is not established with a  provider, would they like to be referred to a provider to establish care? No .   Dental Screening: Recommended annual dental exams for proper oral hygiene  Community Resource Referral / Chronic Care Management: CRR required this visit?  No   CCM required this visit?  No      Plan:     I have personally reviewed and noted the following in the patient's chart:   Medical and social history Use of alcohol, tobacco or illicit drugs  Current medications and supplements including opioid prescriptions. Patient is not currently taking opioid prescriptions. Functional ability and status Nutritional status Physical activity Advanced directives List of other physicians Hospitalizations, surgeries, and ER visits in previous 12 months Vitals Screenings to include cognitive, depression, and falls Referrals and appointments  In addition, I have reviewed and discussed with patient certain preventive protocols, quality metrics, and best practice recommendations. A written personalized care plan for preventive services as well as general preventive health recommendations were provided to patient.     Quentin Angst, [place]   11/15/2021

## 2021-11-15 NOTE — Patient Instructions (Addendum)
[name] , Thank you for taking time to come for your Medicare Wellness Visit. I appreciate your ongoing commitment to your health goals. Please review the following plan we discussed and let me know if I can assist you in the future.   Screening recommendations/referrals: Colonoscopy: Complete Recommended yearly ophthalmology/optometry visit for glaucoma screening and checkup Recommended yearly dental visit for hygiene and checkup  Vaccinations: Influenza vaccine: Complete Pneumococcal vaccine: Due now   Tdap vaccine: Due now Shingles vaccine: Due now    Advanced directives: Patient declined.   Conditions/risks identified: Hypertension,Diabetes  Next appointment: 1 year    [name] , Thank you for taking time to come for your Medicare Wellness Visit. I appreciate your ongoing commitment to your health goals. Please review the following plan we discussed and let me know if I can assist you in the future.   These are the goals we discussed:  Goals      Increase physical activity     Would like to decrease bp and get it down to normal     Patient Stated     None at this time.        This is a list of the screening recommended for you and due dates:  Health Maintenance  Topic Date Due   Tetanus Vaccine  Never done   Zoster (Shingles) Vaccine (1 of 2) Never done   Pneumonia Vaccine (1 - PCV) Never done   COVID-19 Vaccine (3 - Pfizer series) 09/25/2019   Complete foot exam   09/24/2021   Flu Shot  11/30/2021   Hemoglobin A1C  12/26/2021   Eye exam for diabetics  02/02/2022   Cologuard (Stool DNA test)  07/15/2024   Hepatitis C Screening: USPSTF Recommendation to screen - Ages 18-79 yo.  Completed   HPV Vaccine  Aged Out   Colon Cancer Screening  Discontinued     Preventive Care 62 Years and Older, Male Preventive care refers to lifestyle choices and visits with your health care provider that can promote health and wellness. What does preventive care include? A  yearly physical exam. This is also called an annual well check. Dental exams once or twice a year. Routine eye exams. Ask your health care provider how often you should have your eyes checked. Personal lifestyle choices, including: Daily care of your teeth and gums. Regular physical activity. Eating a healthy diet. Avoiding tobacco and drug use. Limiting alcohol use. Practicing safe sex. Taking low doses of aspirin every day. Taking vitamin and mineral supplements as recommended by your health care provider. What happens during an annual well check? The services and screenings done by your health care provider during your annual well check will depend on your age, overall health, lifestyle risk factors, and family history of disease. Counseling  Your health care provider may ask you questions about your: Alcohol use. Tobacco use. Drug use. Emotional well-being. Home and relationship well-being. Sexual activity. Eating habits. History of falls. Memory and ability to understand (cognition). Work and work Statistician. Screening  You may have the following tests or measurements: Height, weight, and BMI. Blood pressure. Lipid and cholesterol levels. These may be checked every 5 years, or more frequently if you are over 74 years old. Skin check. Lung cancer screening. You may have this screening every year starting at age 74 if you have a 30-pack-year history of smoking and currently smoke or have quit within the past 15 years. Fecal occult blood test (FOBT) of the stool. You  may have this test every year starting at age 74. Flexible sigmoidoscopy or colonoscopy. You may have a sigmoidoscopy every 5 years or a colonoscopy every 10 years starting at age 74. Prostate cancer screening. Recommendations will vary depending on your family history and other risks. Hepatitis C blood test. Hepatitis B blood test. Sexually transmitted disease (STD) testing. Diabetes screening. This is done by  checking your blood sugar (glucose) after you have not eaten for a while (fasting). You may have this done every 1-3 years. Abdominal aortic aneurysm (AAA) screening. You may need this if you are a current or former smoker. Osteoporosis. You may be screened starting at age 74 if you are at high risk. Talk with your health care provider about your test results, treatment options, and if necessary, the need for more tests. Vaccines  Your health care provider may recommend certain vaccines, such as: Influenza vaccine. This is recommended every year. Tetanus, diphtheria, and acellular pertussis (Tdap, Td) vaccine. You may need a Td booster every 10 years. Zoster vaccine. You may need this after age 74. Pneumococcal 13-valent conjugate (PCV13) vaccine. One dose is recommended after age 74. Pneumococcal polysaccharide (PPSV23) vaccine. One dose is recommended after age 74. Talk to your health care provider about which screenings and vaccines you need and how often you need them. This information is not intended to replace advice given to you by your health care provider. Make sure you discuss any questions you have with your health care provider. Document Released: 05/15/2015 Document Revised: 01/06/2016 Document Reviewed: 02/17/2015 Elsevier Interactive Patient Education  2017 [place] Prevention in the Home Falls can cause injuries. They can happen to people of all ages. There are many things you can do to make your home safe and to help prevent falls. What can I do on the outside of my home? Regularly fix the edges of walkways and driveways and fix any cracks. Remove anything that might make you trip as you walk through a door, such as a raised step or threshold. Trim any bushes or trees on the path to your home. Use bright outdoor lighting. Clear any walking paths of anything that might make someone trip, such as rocks or tools. Regularly check to see if handrails are loose or  broken. Make sure that both sides of any steps have handrails. Any raised decks and porches should have guardrails on the edges. Have any leaves, snow, or ice cleared regularly. Use sand or salt on walking paths during winter. Clean up any spills in your garage right away. This includes oil or grease spills. What can I do in the bathroom? Use night lights. Install grab bars by the toilet and in the tub and shower. Do not use towel bars as grab bars. Use non-skid mats or decals in the tub or shower. If you need to sit down in the shower, use a plastic, non-slip stool. Keep the floor dry. Clean up any water that spills on the floor as soon as it happens. Remove soap buildup in the tub or shower regularly. Attach bath mats securely with double-sided non-slip rug tape. Do not have throw rugs and other things on the floor that can make you trip. What can I do in the bedroom? Use night lights. Make sure that you have a light by your bed that is easy to reach. Do not use any sheets or blankets that are too big for your bed. They should not hang down onto the floor. Have a  firm chair that has side arms. You can use this for support while you get dressed. Do not have throw rugs and other things on the floor that can make you trip. What can I do in the kitchen? Clean up any spills right away. Avoid walking on wet floors. Keep items that you use a lot in easy-to-reach places. If you need to reach something above you, use a strong step stool that has a grab bar. Keep electrical cords out of the way. Do not use floor polish or wax that makes floors slippery. If you must use wax, use non-skid floor wax. Do not have throw rugs and other things on the floor that can make you trip. What can I do with my stairs? Do not leave any items on the stairs. Make sure that there are handrails on both sides of the stairs and use them. Fix handrails that are broken or loose. Make sure that handrails are as long as  the stairways. Check any carpeting to make sure that it is firmly attached to the stairs. Fix any carpet that is loose or worn. Avoid having throw rugs at the top or bottom of the stairs. If you do have throw rugs, attach them to the floor with carpet tape. Make sure that you have a light switch at the top of the stairs and the bottom of the stairs. If you do not have them, ask someone to add them for you. What else can I do to help prevent falls? Wear shoes that: Do not have high heels. Have rubber bottoms. Are comfortable and fit you well. Are closed at the toe. Do not wear sandals. If you use a stepladder: Make sure that it is fully opened. Do not climb a closed stepladder. Make sure that both sides of the stepladder are locked into place. Ask someone to hold it for you, if possible. Clearly mark and make sure that you can see: Any grab bars or handrails. First and last steps. Where the edge of each step is. Use tools that help you move around (mobility aids) if they are needed. These include: Canes. Walkers. Scooters. Crutches. Turn on the lights when you go into a dark area. Replace any light bulbs as soon as they burn out. Set up your furniture so you have a clear path. Avoid moving your furniture around. If any of your floors are uneven, fix them. If there are any pets around you, be aware of where they are. Review your medicines with your doctor. Some medicines can make you feel dizzy. This can increase your chance of falling. Ask your doctor what other things that you can do to help prevent falls. This information is not intended to replace advice given to you by your health care provider. Make sure you discuss any questions you have with your health care provider. Document Released: 02/12/2009 Document Revised: 09/24/2015 Document Reviewed: 05/23/2014 Elsevier Interactive Patient Education  2017 [employer].

## 2021-11-19 ENCOUNTER — Other Ambulatory Visit: Payer: Self-pay | Admitting: Internal Medicine

## 2021-11-19 DIAGNOSIS — I10 Essential (primary) hypertension: Secondary | ICD-10-CM

## 2021-11-22 ENCOUNTER — Other Ambulatory Visit: Payer: Self-pay | Admitting: Internal Medicine

## 2021-11-24 ENCOUNTER — Encounter: Payer: Self-pay | Admitting: Internal Medicine

## 2021-11-24 ENCOUNTER — Ambulatory Visit
Admission: RE | Admit: 2021-11-24 | Discharge: 2021-11-24 | Disposition: A | Discharge status: Home / Self Care | Payer: Medicare Other | Source: Ambulatory Visit | Attending: Interventional Radiology | Admitting: Interventional Radiology

## 2021-11-24 ENCOUNTER — Ambulatory Visit (INDEPENDENT_AMBULATORY_CARE_PROVIDER_SITE_OTHER): Payer: Medicare Other | Admitting: Internal Medicine

## 2021-11-24 VITALS — BP 112/62 | HR 71 | Resp 16 | Ht 69.0 in | Wt 221.0 lb

## 2021-11-24 DIAGNOSIS — E1121 Type 2 diabetes mellitus with diabetic nephropathy: Secondary | ICD-10-CM | POA: Diagnosis not present

## 2021-11-24 DIAGNOSIS — E559 Vitamin D deficiency, unspecified: Secondary | ICD-10-CM

## 2021-11-24 DIAGNOSIS — B351 Tinea unguium: Secondary | ICD-10-CM | POA: Diagnosis not present

## 2021-11-24 DIAGNOSIS — E1142 Type 2 diabetes mellitus with diabetic polyneuropathy: Secondary | ICD-10-CM

## 2021-11-24 DIAGNOSIS — Z0001 Encounter for general adult medical examination with abnormal findings: Secondary | ICD-10-CM

## 2021-11-24 DIAGNOSIS — I739 Peripheral vascular disease, unspecified: Secondary | ICD-10-CM

## 2021-11-24 DIAGNOSIS — Z23 Encounter for immunization: Secondary | ICD-10-CM

## 2021-11-24 DIAGNOSIS — L309 Dermatitis, unspecified: Secondary | ICD-10-CM

## 2021-11-24 DIAGNOSIS — E785 Hyperlipidemia, unspecified: Secondary | ICD-10-CM

## 2021-11-24 DIAGNOSIS — N400 Enlarged prostate without lower urinary tract symptoms: Secondary | ICD-10-CM

## 2021-11-24 DIAGNOSIS — I10 Essential (primary) hypertension: Secondary | ICD-10-CM

## 2021-11-24 MED ORDER — TRIAMCINOLONE ACETONIDE 0.1 % EX CREA: 1.0000 | TOPICAL_CREAM | Freq: Every day | CUTANEOUS | 1 refills | Status: AC | PRN

## 2021-11-24 NOTE — Patient Instructions (Addendum)
Please take Gabapentin regularly.  Please use Lamisil cream for toenail infection.  Please continue to follow low carb diet and perform moderate exercise/walking at least 150 mins/week.  Please consider getting Shingrix and Tdap vaccine at your local pharmacy.

## 2021-11-24 NOTE — Progress Notes (Signed)
Chief Complaint: Leg pain   Referring Physician(s): Patel,Kevin P   PCP: Dr. Agnes Lawrence   History of Present Illness: Jonathan [name] is a 76 y.o. male presenting as a scheduled follow up to [place] clinic, with history of lower extremity pain.    Jonathan [name] joins [place] today by way of telemedicine visit.  We confirmed his identity with 2 personal identifiers.     Hx:  He was referred to [place] 07/12/21 having right greater than left foot pain and calf pain, present for about 6 months that is occurring daily.  He describes pain when he walks short distances routinely.  He also is describing pain at night in his feet, that routinely wakes him up nightly.  The only way that he can make the pain better at night time is by getting out of bed and walking around the house.  He describes clearly the right foot is the side with worse pain at night time.    No history of prior wound.  He sometimes takes some tylenol for the pain.    He denies resting chest pain or history of MI.  He says he had a clean heart catheterization in [place] about 20 years ago.  He says he was treated for a "small stroke" about 20 years ago in [place].     CV risk factors: HTN, CVD, DM   Interval Hx:  After our initial visit, we elected a conservative approach and he was referred for PT evaluation and exercise therapy.  He performed this near his home, at [facility].   He tells me that he completed the exercise therapy, and that it "went well."  They assigned him some specific exercises to perform at home.    He tells me that he still has pain when ambulating, but he denies specifically any pain in his legs/feet at night. He denies any new wounds.   Non-invasive exam 06/29/21 Right ABI: 0.65.  Monophasic tibial waveforms Left ABI: 0.64. Monophasic tibial waveforms  Past Medical History:  Diagnosis Date   Arthritis    ra, sees dr Sedonia Small   Coronary artery disease    saw dr zachery in  [place] 20 yrs ago, released from cardiology   Diabetes mellitus without complication ([place])    type 2   GERD (gastroesophageal reflux disease)    Hypercholesteremia    Hypertension    Urethral stricture     Past Surgical History:  Procedure Laterality Date   BACK SURGERY     x 3 lower back   CARDIAC CATHETERIZATION  2000   small amt plaque relased from cardiology 20 yrs ago   CHOLECYSTECTOMY     COLONOSCOPY WITH PROPOFOL N/A 10/26/2021   Procedure: COLONOSCOPY WITH PROPOFOL;  Surgeon: Eloise Harman, DO;  [facility];  Service: Endoscopy;  Laterality: N/A;  To arrive at [place] N/A 04/17/2019   Procedure: CYSTOSCOPY WITH URETHRAL DILATATION, CYSTOGRAM;  Surgeon: Ceasar Mons, MD;  Location: [facility];  Service: Urology;  Laterality: N/A;   EYE SURGERY Bilateral    cataracts   IR RADIOLOGIST EVAL & MGMT  07/12/2021   POLYPECTOMY  10/26/2021   Procedure: POLYPECTOMY INTESTINAL;  Surgeon: Eloise Harman, DO;  [facility];  Service: Endoscopy;;   urethral stricture surgery  15 yrs ago    Allergies: Ciprofloxacin, Mosquito (diagnostic), and Oxycodone-acetaminophen  Medications: Prior to Admission medications  Medication Sig Start Date End Date Taking? Authorizing Provider  amLODipine (NORVASC) 10 MG tablet TAKE 1 TABLET BY MOUTH DAILY 10/08/21   [name], MD  aspirin EC 81 MG tablet Take 81 mg by mouth daily. Swallow whole.    [provider]  Cyanocobalamin 2500 MCG CHEW Chew by mouth daily.    [provider]  finasteride (PROSCAR) 5 MG tablet TAKE 1 TABLET BY MOUTH DAILY 10/08/21   [name], MD  folic acid (FOLVITE) 1 MG tablet Take 1 mg by mouth daily.    [provider]  gabapentin (NEURONTIN) 100 MG capsule Take 1 capsule (100 mg total) by mouth at bedtime. Patient taking differently: Take 100 mg by mouth daily as needed (pain). 10/04/21   [name], MD  glipiZIDE (GLUCOTROL) 5 MG tablet Take 1 tablet (5 mg total) by mouth 2 (two) times daily before a meal. Patient taking differently: Take 5 mg by mouth daily at 6 (six) AM. 06/30/21   Posey Pronto, Colin Broach, MD  losartan-hydrochlorothiazide (HYZAAR) 100-25 MG tablet TAKE 1 TABLET BY MOUTH DAILY 11/08/21   [name], MD  Menthol, Topical Analgesic, (BLUE-EMU MAXIMUM STRENGTH EX) Apply 1 Application topically daily as needed (pain).    [provider]  methotrexate (RHEUMATREX) 2.5 MG tablet Take 12.5 mg by mouth every Sunday. Caution:Chemotherapy. Protect from light. Take 5 tablets PO once weekly.    [provider]  metoprolol tartrate (LOPRESSOR) 50 MG tablet TAKE 1 TABLET BY MOUTH TWICE  DAILY 11/22/21   [name], MD  naphazoline-pheniramine (VISINE) 0.025-0.3 % ophthalmic solution Place 1 drop into both eyes daily as needed for eye irritation.    [provider]  omeprazole (PRILOSEC) 20 MG capsule Take 1 capsule by mouth once daily 11/22/21   [name], MD  predniSONE (DELTASONE) 1 MG tablet Take 1 mg by mouth daily with breakfast.    [provider]  rosuvastatin (CRESTOR) 10 MG tablet Take 1 tablet (10 mg total) by mouth daily. 02/24/21   [name], MD  silodosin (RAPAFLO) 8 MG CAPS capsule Take 1 capsule (8 mg total) by mouth at bedtime. 07/28/21   [name], MD  triamcinolone cream (KENALOG) 0.1 % Apply 1 Application topically daily as needed (itching).    [provider]     Family History  Problem Relation Age of Onset   Heart Problems Father     Social History   Socioeconomic History   Marital status: Married    Spouse name: Not on file   Number of children: Not on file   Years of education: Not on file   Highest education level: Not on file  Occupational History   Not on file  Tobacco Use   Smoking status: Never   Smokeless tobacco: Never  Vaping Use   Vaping Use: Never used  Substance and  Sexual Activity   Alcohol use: No   Drug use: No   Sexual activity: Not on file  Other Topics Concern   Not on file  Social History Narrative   Not on file   Social Determinants of Health   Financial Resource Strain: Low Risk  (11/15/2021)   Overall Financial Resource Strain (CARDIA)    Difficulty of Paying Living Expenses: Not hard at all  Food Insecurity: No Food Insecurity (11/15/2021)   Hunger Vital Sign    Worried About Running Out of Food in the Last Year: Never true  Ran Out of Food in the Last Year: Never true  Transportation Needs: No Transportation Needs (11/15/2021)   PRAPARE - Hydrologist (Medical): No    Lack of Transportation (Non-Medical): No  Physical Activity: Sufficiently Active (11/15/2021)   Exercise Vital Sign    Days of Exercise per Week: 5 days    Minutes of Exercise per Session: 30 min  Stress: No Stress Concern Present (11/15/2021)   [place]    Feeling of Stress : Not at all  Social Connections: Moderately Integrated (11/15/2021)   Social Connection and Isolation Panel [NHANES]    Frequency of Communication with Friends and Family: Three times a week    Frequency of Social Gatherings with Friends and Family: Twice a week    Attends Religious Services: More than 4 times per year    Active Member of [employer] or Organizations: No    Attends Archivist Meetings: Never    Marital Status: Married       Review of Systems  Review of Systems: A 12 point ROS discussed and pertinent positives are indicated in the HPI above.  All other systems are negative.  Advance Care Plan: The advanced care plan/surrogate decision maker was discussed at the time of visit and documented in the medical record.    Physical Exam No direct physical exam was performed (except for noted visual exam findings with Video Visits).   Vital Signs: There were no vitals taken for  this visit.  Imaging: No results found.  Labs:  CBC: Recent Labs    12/28/20 1616  WBC 8.1  HGB 13.5  HCT 40.8  PLT 208    COAGS: No results for input(s): "INR", "APTT" in the last 8760 hours.  BMP: Recent Labs    01/06/21 0803 02/03/21 1533 02/24/21 1043 10/25/21 1603  NA 141 138 136 137  K 4.5 3.8 4.2 3.8  CL 103 101 100 108  CO2 '22 20 22 24  '$ GLUCOSE 141* 137* 157* 82  BUN 19 30* 26 20  CALCIUM 9.2 8.8 9.4 8.8*  CREATININE 1.20 1.68* 1.36* 1.45*  GFRNONAA  --   --   --  51*    LIVER FUNCTION TESTS: Recent Labs    02/24/21 1043  BILITOT 0.5  AST 31  ALT 28  ALKPHOS 87  PROT 7.7  ALBUMIN 3.8    TUMOR MARKERS: No results for input(s): "AFPTM", "CEA", "CA199", "CHROMGRNA" in the last 8760 hours.  Assessment and Plan:  Assessment:  Jonathan [name] is a 76yo male with lower extremity PAD, symptoms of Rutherford 4 having improved to Rutherford 3 after a trial of exercise therapy.    Non-invasive lower extremity exam shows evidence of fem-pop disease and tibial disease.     Today I discussed with Jonathan [name] potential options for therapy.  At this time, this might include continued maximal medical management, maximal medical management with another round of exercise therapy with PT (I assured him that this should be covered by his insurance), or to be more aggressive with invasive treatment such as angiogram/intervention.    At this time, he is satisfied to continue using the exercises that PT prescribed, and does not wish to initiate another round of exercise therapy formally.  I did encourage him to continue this at home.     We will hold off on any intervention at this time based on his preference.    Maximal medical management  should continue as recommended by updated AHA guidelines1.  This includes anti-platelet medication, tight blood glucose control to a HbA1c < 7, tight blood pressure control, maximum-dose HMG-CoA reductase inhibitor, and smoking cessation.     Annual flu vaccination is also recommended, with Class 1 recommendation1.    Plan: - He wishes to continue conservative measures, with home exercise and medical management.   - We will set up a 1 year follow up clinic visit.  No reason for surveillance imaging at this time.  - I assured him I am happy to send another prescription for 2nd round of rehab walking program if he likes.  This should be covered by insurance.  - We are happy to see him back earlier if he has any worsening of symptoms or wound like to pursue more aggressive treatment -Recommend continue maximal medical therapy for cardiovascular risk reduction, including anti-platelet therapy. -Observe healthy foot care habits, given the presence of diabetes, with at least annual foot inspection performed in the setting of DM.    -Annual flu vaccination is recommended in the setting of known PAD, in the absence of contra-indications.   Electronically Signed: Corrie Mckusick 11/24/2021, 10:40 AM   I spent a total of    25 Minutes in remote  clinical consultation, greater than 50% of which was counseling/coordinating care for PAD, Rutherford category 3 symptoms.    Visit type: Audio only (telephone). Audio (no video) only due to patient's lack of internet/smartphone capability. Alternative for in-person consultation at [facility], [address], [place], [place]. This visit type was conducted due to national recommendations for restrictions regarding the COVID-19 Pandemic (e.g. social distancing).  This format is felt to be most appropriate for this patient at this time.  All issues noted in this document were discussed and addressed.

## 2021-11-24 NOTE — Progress Notes (Addendum)
Established Patient Office Visit  Subjective:  Patient ID: Jonathan [name], male    DOB: April 29, 1946  Age: 76 y.o. MRN: [id number]  CC:  Chief Complaint  Patient presents with   Annual Exam    Annual exam pt has been itching for awhile also pt has been bruising easily his toes have been hurting also     HPI Jonathan [name] is a 76 y.o. male with past medical history of HTN, DM, GERD, RA, CKD stage 3, BPH and obesity who presents for annual physical.  HTN: BP is well-controlled. Takes medications regularly. Patient denies headache, dizziness, chest pain, dyspnea or palpitations.   Type II DM: His HbA1C was 7.3 in 02/23.  He has been taking glipizide once daily. His blood glucose has been running between 80-120 now with Glipizide 5 mg QD.  Denies any fatigue, polyuria or polydipsia.  He also has a history of CKD stage III.  He has started taking Crestor for HLD.  He reports bilateral toe pain, around the tip of the toe.  Of note, he has history of PAD and has seen vascular surgery and podiatry for it.  He also has history of diabetic neuropathy, for which he takes gabapentin as needed instead of daily.  His dose of prednisone has been decreased recently, which he takes for RA.  He also reports eating over bilateral forearms, but denies any discrete rash. It improves with Kenalog cream.  Denies any new chemical exposure at home.   Past Medical History:  Diagnosis Date   Arthritis    ra, sees dr Sedonia Small   Coronary artery disease    saw dr zachery in [place] 20 yrs ago, released from cardiology   Diabetes mellitus without complication ([place])    type 2   GERD (gastroesophageal reflux disease)    Hypercholesteremia    Hypertension    Urethral stricture     Past Surgical History:  Procedure Laterality Date   BACK SURGERY     x 3 lower back   CARDIAC CATHETERIZATION  2000   small amt plaque relased from cardiology 20 yrs ago   CHOLECYSTECTOMY     COLONOSCOPY WITH  PROPOFOL N/A 10/26/2021   Procedure: COLONOSCOPY WITH PROPOFOL;  Surgeon: Eloise Harman, DO;  [facility];  Service: Endoscopy;  Laterality: N/A;  To arrive at [place] N/A 04/17/2019   Procedure: CYSTOSCOPY WITH URETHRAL DILATATION, CYSTOGRAM;  Surgeon: Ceasar Mons, MD;  Location: [facility];  Service: Urology;  Laterality: N/A;   EYE SURGERY Bilateral    cataracts   IR RADIOLOGIST EVAL & MGMT  07/12/2021   POLYPECTOMY  10/26/2021   Procedure: POLYPECTOMY INTESTINAL;  Surgeon: Eloise Harman, DO;  [facility];  Service: Endoscopy;;   urethral stricture surgery  15 yrs ago    Family History  Problem Relation Age of Onset   Heart Problems Father     Social History   Socioeconomic History   Marital status: Married    Spouse name: Not on file   Number of children: Not on file   Years of education: Not on file   Highest education level: Not on file  Occupational History   Not on file  Tobacco Use   Smoking status: Never   Smokeless tobacco: Never  Vaping Use   Vaping Use: Never used  Substance and Sexual Activity   Alcohol use: No   Drug use: No  Sexual activity: Not on file  Other Topics Concern   Not on file  Social History Narrative   Not on file   Social Determinants of Health   Financial Resource Strain: Low Risk  (11/15/2021)   Overall Financial Resource Strain (CARDIA)    Difficulty of Paying Living Expenses: Not hard at all  Food Insecurity: No Food Insecurity (11/15/2021)   Hunger Vital Sign    Worried About Running Out of Food in the Last Year: Never true    Ran Out of Food in the Last Year: Never true  Transportation Needs: No Transportation Needs (11/15/2021)   PRAPARE - Hydrologist (Medical): No    Lack of Transportation (Non-Medical): No  Physical Activity: Sufficiently Active (11/15/2021)   Exercise Vital Sign    Days of Exercise per  Week: 5 days    Minutes of Exercise per Session: 30 min  Stress: No Stress Concern Present (11/15/2021)   [place]    Feeling of Stress : Not at all  Social Connections: Moderately Integrated (11/15/2021)   Social Connection and Isolation Panel [NHANES]    Frequency of Communication with Friends and Family: Three times a week    Frequency of Social Gatherings with Friends and Family: Twice a week    Attends Religious Services: More than 4 times per year    Active Member of [employer] or Organizations: No    Attends Archivist Meetings: Never    Marital Status: Married  Human resources officer Violence: Not At Risk (11/15/2021)   Humiliation, Afraid, Rape, and Kick questionnaire    Fear of Current or Ex-Partner: No    Emotionally Abused: No    Physically Abused: No    Sexually Abused: No    Outpatient Medications Prior to Visit  Medication Sig Dispense Refill   amLODipine (NORVASC) 10 MG tablet TAKE 1 TABLET BY MOUTH DAILY 100 tablet 2   aspirin EC 81 MG tablet Take 81 mg by mouth daily. Swallow whole.     Cyanocobalamin 2500 MCG CHEW Chew by mouth daily.     finasteride (PROSCAR) 5 MG tablet TAKE 1 TABLET BY MOUTH DAILY 150 tablet 2   folic acid (FOLVITE) 1 MG tablet Take 1 mg by mouth daily.     gabapentin (NEURONTIN) 100 MG capsule Take 1 capsule (100 mg total) by mouth at bedtime. (Patient taking differently: Take 100 mg by mouth daily as needed (pain).) 30 capsule 3   glipiZIDE (GLUCOTROL) 5 MG tablet Take 1 tablet (5 mg total) by mouth 2 (two) times daily before a meal. (Patient taking differently: Take 5 mg by mouth daily at 6 (six) AM.) 30 tablet 0   losartan-hydrochlorothiazide (HYZAAR) 100-25 MG tablet TAKE 1 TABLET BY MOUTH DAILY 100 tablet 2   Menthol, Topical Analgesic, (BLUE-EMU MAXIMUM STRENGTH EX) Apply 1 Application topically daily as needed (pain).     methotrexate (RHEUMATREX) 2.5 MG tablet Take 12.5 mg  by mouth every Sunday. Caution:Chemotherapy. Protect from light. Take 5 tablets PO once weekly.     metoprolol tartrate (LOPRESSOR) 50 MG tablet TAKE 1 TABLET BY MOUTH TWICE  DAILY 200 tablet 2   naphazoline-pheniramine (VISINE) 0.025-0.3 % ophthalmic solution Place 1 drop into both eyes daily as needed for eye irritation.     omeprazole (PRILOSEC) 20 MG capsule Take 1 capsule by mouth once daily 30 capsule 3   predniSONE (DELTASONE) 1 MG tablet Take 1 mg by mouth daily  with breakfast.     rosuvastatin (CRESTOR) 10 MG tablet Take 1 tablet (10 mg total) by mouth daily. 90 tablet 1   silodosin (RAPAFLO) 8 MG CAPS capsule Take 1 capsule (8 mg total) by mouth at bedtime. 90 capsule 1   triamcinolone cream (KENALOG) 0.1 % Apply 1 Application topically daily as needed (itching).     No facility-administered medications prior to visit.    Allergies  Allergen Reactions   Ciprofloxacin Itching   Mosquito (Diagnostic) Itching   Oxycodone-Acetaminophen Rash    ROS Review of Systems  Constitutional:  Negative for chills and fever.  HENT:  Negative for congestion and sore throat.   Eyes:  Negative for pain and discharge.  Respiratory:  Negative for cough and shortness of breath.   Cardiovascular:  Negative for chest pain and palpitations.  Gastrointestinal:  Negative for constipation, diarrhea, nausea and vomiting.  Endocrine: Negative for polydipsia and polyuria.  Genitourinary:  Negative for dysuria and hematuria.  Musculoskeletal:  Positive for arthralgias and back pain. Negative for neck pain and neck stiffness.  Skin:  Negative for rash.  Neurological:  Positive for numbness. Negative for dizziness, weakness and headaches.  Psychiatric/Behavioral:  Negative for agitation and behavioral problems.       Objective:    Physical Exam Vitals reviewed.  Constitutional:      General: He is not in acute distress.    Appearance: He is obese. He is not diaphoretic.  HENT:     Head:  Normocephalic and atraumatic.     Nose: Nose normal.     Mouth/Throat:     Mouth: Mucous membranes are moist.  Eyes:     General: No scleral icterus.    Extraocular Movements: Extraocular movements intact.  Cardiovascular:     Rate and Rhythm: Normal rate and regular rhythm.     Heart sounds: Normal heart sounds. No murmur heard. Pulmonary:     Breath sounds: Normal breath sounds. No wheezing or rales.  Abdominal:     Palpations: Abdomen is soft.     Tenderness: There is no abdominal tenderness.  Musculoskeletal:     Cervical back: Neck supple. No tenderness.     Right lower leg: No edema.     Left lower leg: No edema.  Skin:    General: Skin is warm.     Findings: No rash.  Neurological:     General: No focal deficit present.     Mental Status: He is alert and oriented to person, place, and time.     Cranial Nerves: No cranial nerve deficit.     Sensory: No sensory deficit.     Motor: No weakness.  Psychiatric:        Mood and Affect: Mood normal.        Behavior: Behavior normal.     BP 112/62 (BP Location: Left Arm, Patient Position: Sitting, Cuff Size: Normal)   Pulse 71   Resp 16   Ht $R'5\' 9"'BC$  (1.753 m)   Wt 221 lb (100.2 kg)   SpO2 96%   BMI 32.64 kg/m  Wt Readings from Last 3 Encounters:  11/24/21 221 lb (100.2 kg)  10/26/21 227 lb (103 kg)  10/04/21 225 lb (102.1 kg)    Lab Results  Component Value Date   TSH 3.070 11/24/2021   Lab Results  Component Value Date   WBC 5.0 11/24/2021   HGB 12.0 (L) 11/24/2021   HCT 35.6 (L) 11/24/2021   MCV 88 11/24/2021   PLT  122 (L) 11/24/2021   Lab Results  Component Value Date   NA 142 11/24/2021   K 4.6 11/24/2021   CO2 22 11/24/2021   GLUCOSE 93 11/24/2021   BUN 19 11/24/2021   CREATININE 1.31 (H) 11/24/2021   BILITOT 0.4 11/24/2021   ALKPHOS 103 11/24/2021   AST 39 11/24/2021   ALT 29 11/24/2021   PROT 7.6 11/24/2021   ALBUMIN 3.7 (L) 11/24/2021   CALCIUM 8.9 11/24/2021   ANIONGAP 5 10/25/2021    EGFR 57 (L) 11/24/2021   Lab Results  Component Value Date   CHOL 135 11/24/2021   Lab Results  Component Value Date   HDL 40 11/24/2021   Lab Results  Component Value Date   LDLCALC 69 11/24/2021   Lab Results  Component Value Date   TRIG 152 (H) 11/24/2021   Lab Results  Component Value Date   CHOLHDL 3.4 11/24/2021   Lab Results  Component Value Date   HGBA1C 6.9 (H) 11/24/2021      Assessment & Plan:   Problem List Items Addressed This Visit       Cardiovascular and Mediastinum   Essential hypertension    BP Readings from Last 1 Encounters:  11/24/21 112/62  Overall well-controlled with Amlodipine, Losartan-HCTZ and Metoprolol Counseled for compliance with the medications Advised DASH diet and moderate exercise/walking, at least 150 mins/week      Relevant Orders   TSH (Completed)   PAD (peripheral artery disease) ([place])    On aspirin and statin Followed by cardiology Has been doing PT for increasing exercise tolerance        Endocrine   Type II diabetes mellitus with nephropathy (HCC)    Lab Results  Component Value Date   HGBA1C 6.9 (H) 11/24/2021   On Glipizide 5 mg QD Blood glucose at home has been around 80-120 Recently decreased dose of steroid as well, which should improve glycemic profile Advised to follow diabetic diet On ARB F/u CMP and HbA1C Diabetic eye exam: Advised to follow up with Ophthalmology for diabetic eye exam      Relevant Orders   Microalbumin / creatinine urine ratio (Completed)   Hemoglobin A1c (Completed)   CMP14+EGFR (Completed)   Diabetic polyneuropathy associated with type 2 diabetes mellitus (HCC)    His toe pain could be due to diabetic neuropathy as well Advised to take gabapentin 100 mg daily instead of as needed        Musculoskeletal and Integument   Eczema    Has itching over forearms and legs No apparent rash, but improved with Kenalog      Relevant Medications   triamcinolone cream (KENALOG)  0.1 %   Other Relevant Orders   CBC with Differential/Platelet (Completed)   Onychomycosis    Advised to apply Lamisil cream      Relevant Orders   CBC with Differential/Platelet (Completed)     Genitourinary   BPH (benign prostatic hyperplasia)    On Rapaflo and Finasteride Followed by Urology        Other   Hyperlipidemia LDL goal <100    Lipid profile reviewed On Crestor 10 mg daily, needs to remain compliant      Relevant Orders   Lipid panel (Completed)   Encounter for general adult medical examination with abnormal findings - Primary    Physical exam as documented. Counseling done  re healthy lifestyle involving commitment to 150 minutes exercise per week, heart healthy diet, and attaining healthy weight.The importance of adequate sleep  also discussed. Changes in health habits are decided on by the patient with goals and time frames  set for achieving them. Immunization and cancer screening needs are specifically addressed at this visit.      Relevant Orders   TSH (Completed)   CMP14+EGFR (Completed)   CBC with Differential/Platelet (Completed)   Other Visit Diagnoses     Vitamin D deficiency       Relevant Orders   VITAMIN D 25 Hydroxy (Vit-D Deficiency, Fractures) (Completed)   Need for pneumococcal 20-valent conjugate vaccination       Relevant Orders   Pneumococcal conjugate vaccine 20-valent (Prevnar 20) (Completed)       Meds ordered this encounter  Medications   triamcinolone cream (KENALOG) 0.1 %    Sig: Apply 1 Application topically daily as needed (itching).    Dispense:  80 g    Refill:  1    Follow-up: Return in about 4 months (around 03/27/2022) for DM and CKD.    [name], MD

## 2021-11-26 DIAGNOSIS — B351 Tinea unguium: Secondary | ICD-10-CM | POA: Insufficient documentation

## 2021-11-26 DIAGNOSIS — L309 Dermatitis, unspecified: Secondary | ICD-10-CM | POA: Insufficient documentation

## 2021-11-26 DIAGNOSIS — Z0001 Encounter for general adult medical examination with abnormal findings: Secondary | ICD-10-CM | POA: Insufficient documentation

## 2021-11-26 DIAGNOSIS — I739 Peripheral vascular disease, unspecified: Secondary | ICD-10-CM | POA: Insufficient documentation

## 2021-11-26 LAB — CMP14+EGFR
ALT: 29 IU/L (ref 0–44)
AST: 39 IU/L (ref 0–40)
Albumin/Globulin Ratio: 0.9 — ABNORMAL LOW (ref 1.2–2.2)
Albumin: 3.7 g/dL — ABNORMAL LOW (ref 3.8–4.8)
Alkaline Phosphatase: 103 IU/L (ref 44–121)
BUN/Creatinine Ratio: 15 (ref 10–24)
BUN: 19 mg/dL (ref 8–27)
Bilirubin Total: 0.4 mg/dL (ref 0.0–1.2)
CO2: 22 mmol/L (ref 20–29)
Calcium: 8.9 mg/dL (ref 8.6–10.2)
Chloride: 105 mmol/L (ref 96–106)
Creatinine, Ser: 1.31 mg/dL — ABNORMAL HIGH (ref 0.76–1.27)
Globulin, Total: 3.9 g/dL (ref 1.5–4.5)
Glucose: 93 mg/dL (ref 70–99)
Potassium: 4.6 mmol/L (ref 3.5–5.2)
Sodium: 142 mmol/L (ref 134–144)
Total Protein: 7.6 g/dL (ref 6.0–8.5)
eGFR: 57 mL/min/{1.73_m2} — ABNORMAL LOW (ref >59)

## 2021-11-26 LAB — CBC WITH DIFFERENTIAL/PLATELET
Basophils Absolute: 0 10*3/uL (ref 0.0–0.2)
Basos: 0 %
EOS (ABSOLUTE): 0.1 10*3/uL (ref 0.0–0.4)
Eos: 3 %
Hematocrit: 35.6 % — ABNORMAL LOW (ref 37.5–51.0)
Hemoglobin: 12 g/dL — ABNORMAL LOW (ref 13.0–17.7)
Immature Grans (Abs): 0 10*3/uL (ref 0.0–0.1)
Immature Granulocytes: 1 %
Lymphocytes Absolute: 1.2 10*3/uL (ref 0.7–3.1)
Lymphs: 23 %
MCH: 29.6 pg (ref 26.6–33.0)
MCHC: 33.7 g/dL (ref 31.5–35.7)
MCV: 88 fL (ref 79–97)
Monocytes Absolute: 0.8 10*3/uL (ref 0.1–0.9)
Monocytes: 16 %
Neutrophils Absolute: 2.9 10*3/uL (ref 1.4–7.0)
Neutrophils: 57 %
Platelets: 122 10*3/uL — ABNORMAL LOW (ref 150–450)
RBC: 4.05 x10E6/uL — ABNORMAL LOW (ref 4.14–5.80)
RDW: 15 % (ref 11.6–15.4)
WBC: 5 10*3/uL (ref 3.4–10.8)

## 2021-11-26 LAB — MICROALBUMIN / CREATININE URINE RATIO
Creatinine, Urine: 100.5 mg/dL
Microalb/Creat Ratio: 30 mg/g creat — ABNORMAL HIGH (ref 0–29)
Microalbumin, Urine: 29.8 ug/mL

## 2021-11-26 LAB — LIPID PANEL
Chol/HDL Ratio: 3.4 ratio (ref 0.0–5.0)
Cholesterol, Total: 135 mg/dL (ref 100–199)
HDL: 40 mg/dL (ref >39)
LDL Chol Calc (NIH): 69 mg/dL (ref 0–99)
Triglycerides: 152 mg/dL — ABNORMAL HIGH (ref 0–149)
VLDL Cholesterol Cal: 26 mg/dL (ref 5–40)

## 2021-11-26 LAB — HEMOGLOBIN A1C
Est. average glucose Bld gHb Est-mCnc: 151 mg/dL
Hgb A1c MFr Bld: 6.9 % — ABNORMAL HIGH (ref 4.8–5.6)

## 2021-11-26 LAB — TSH: TSH: 3.07 u[IU]/mL (ref 0.450–4.500)

## 2021-11-26 LAB — VITAMIN D 25 HYDROXY (VIT D DEFICIENCY, FRACTURES): Vit D, 25-Hydroxy: 41 ng/mL (ref 30.0–100.0)

## 2021-11-26 NOTE — Assessment & Plan Note (Signed)
Has itching over forearms and legs No apparent rash, but improved with Kenalog

## 2021-11-26 NOTE — Assessment & Plan Note (Signed)
Advised to apply Lamisil cream

## 2021-11-26 NOTE — Assessment & Plan Note (Signed)
On Rapaflo and Finasteride Followed by Urology

## 2021-11-26 NOTE — Assessment & Plan Note (Signed)
On aspirin and statin Followed by cardiology Has been doing PT for increasing exercise tolerance

## 2021-11-26 NOTE — Assessment & Plan Note (Signed)
His toe pain could be due to diabetic neuropathy as well Advised to take gabapentin 100 mg daily instead of as needed

## 2021-11-26 NOTE — Assessment & Plan Note (Signed)
BP Readings from Last 1 Encounters:  11/24/21 112/62   Overall well-controlled with Amlodipine, Losartan-HCTZ and Metoprolol Counseled for compliance with the medications Advised DASH diet and moderate exercise/walking, at least 150 mins/week

## 2021-11-26 NOTE — Assessment & Plan Note (Signed)
Lipid profile reviewed On Crestor 10 mg daily, needs to remain compliant

## 2021-11-26 NOTE — Assessment & Plan Note (Signed)
Lab Results  Component Value Date   HGBA1C 6.9 (H) 11/24/2021    On Glipizide 5 mg QD Blood glucose at home has been around 80-120 Recently decreased dose of steroid as well, which should improve glycemic profile Advised to follow diabetic diet On ARB F/u CMP and HbA1C Diabetic eye exam: Advised to follow up with Ophthalmology for diabetic eye exam

## 2021-11-26 NOTE — Assessment & Plan Note (Addendum)

## 2021-12-01 ENCOUNTER — Other Ambulatory Visit: Payer: Self-pay | Admitting: Internal Medicine

## 2021-12-01 DIAGNOSIS — E785 Hyperlipidemia, unspecified: Secondary | ICD-10-CM

## 2021-12-07 ENCOUNTER — Other Ambulatory Visit: Payer: Self-pay | Admitting: Internal Medicine

## 2021-12-07 DIAGNOSIS — N400 Enlarged prostate without lower urinary tract symptoms: Secondary | ICD-10-CM

## 2021-12-21 ENCOUNTER — Other Ambulatory Visit: Payer: Self-pay

## 2021-12-21 DIAGNOSIS — E1142 Type 2 diabetes mellitus with diabetic polyneuropathy: Secondary | ICD-10-CM

## 2021-12-21 MED ORDER — GABAPENTIN 100 MG PO CAPS: 100.0000 mg | ORAL_CAPSULE | Freq: Every day | ORAL | 3 refills | Status: DC

## 2021-12-29 ENCOUNTER — Ambulatory Visit (INDEPENDENT_AMBULATORY_CARE_PROVIDER_SITE_OTHER): Payer: Medicare Other | Admitting: Family Medicine

## 2021-12-29 ENCOUNTER — Encounter: Payer: Self-pay | Admitting: Family Medicine

## 2021-12-29 DIAGNOSIS — R110 Nausea: Secondary | ICD-10-CM

## 2021-12-29 DIAGNOSIS — K529 Noninfective gastroenteritis and colitis, unspecified: Secondary | ICD-10-CM

## 2021-12-29 MED ORDER — ONDANSETRON HCL 4 MG PO TABS: 4.0000 mg | ORAL_TABLET | Freq: Three times a day (TID) | ORAL | 0 refills | Status: DC | PRN

## 2021-12-29 NOTE — Progress Notes (Signed)
Virtual Visit via Telephone Note   This visit type was conducted due to national recommendations for restrictions regarding the COVID-19 Pandemic (e.g. social distancing) in an effort to limit this patient's exposure and mitigate transmission in our community.  Due to his co-morbid illnesses, this patient is at least at moderate risk for complications without adequate follow up.  This format is felt to be most appropriate for this patient at this time.  The patient did not have access to video technology/had technical difficulties with video requiring transitioning to audio format only (telephone).  All issues noted in this document were discussed and addressed.  No physical exam could be performed with this format.  Please refer to the patient's chart for his  consent to telehealth for [facility].   Evaluation Performed:  Follow-up visit  Date:  12/29/2021   ID:  Jonathan, [name] 1946/03/18, MRN [id number]  Patient Location: Home Provider Location: Office/Clinic  Participants: Patient Location of Patient: Home Location of Provider: Telehealth Consent was obtain for visit to be over via telehealth. I verified that I am speaking with the correct person using two identifiers.  PCP:  [name], MD   Chief Complaint:    History of Present Illness:    Jonathan [name] is a 76 y.o. male with c/o of acute intermittent diarrhea that started on 12/22/21. He report having 2 loose, watery, non-bloody, and non-mucusy stools. He denies abdominal pain, fever, and vomiting. He denies recent travels or any variance in his diet.    The patient does not have symptoms concerning for COVID-19 infection (fever, chills, cough, or new shortness of breath).   Past Medical, Surgical, Social History, Allergies, and Medications have been Reviewed.  Past Medical History:  Diagnosis Date   Arthritis    ra, sees dr Sedonia Small   Coronary artery disease    saw dr zachery in [place] 20 yrs  ago, released from cardiology   Diabetes mellitus without complication ([place])    type 2   GERD (gastroesophageal reflux disease)    Hypercholesteremia    Hypertension    Urethral stricture    Past Surgical History:  Procedure Laterality Date   BACK SURGERY     x 3 lower back   CARDIAC CATHETERIZATION  2000   small amt plaque relased from cardiology 20 yrs ago   CHOLECYSTECTOMY     COLONOSCOPY WITH PROPOFOL N/A 10/26/2021   Procedure: COLONOSCOPY WITH PROPOFOL;  Surgeon: Eloise Harman, DO;  [facility];  Service: Endoscopy;  Laterality: N/A;  To arrive at [place] N/A 04/17/2019   Procedure: CYSTOSCOPY WITH URETHRAL DILATATION, CYSTOGRAM;  Surgeon: Ceasar Mons, MD;  Location: [facility];  Service: Urology;  Laterality: N/A;   EYE SURGERY Bilateral    cataracts   IR RADIOLOGIST EVAL & MGMT  07/12/2021   POLYPECTOMY  10/26/2021   Procedure: POLYPECTOMY INTESTINAL;  Surgeon: Eloise Harman, DO;  [facility];  Service: Endoscopy;;   urethral stricture surgery  15 yrs ago     Current Meds  Medication Sig   amLODipine (NORVASC) 10 MG tablet TAKE 1 TABLET BY MOUTH DAILY   aspirin EC 81 MG tablet Take 81 mg by mouth daily. Swallow whole.   Cyanocobalamin 2500 MCG CHEW Chew by mouth daily.   finasteride (PROSCAR) 5 MG tablet TAKE 1 TABLET BY MOUTH DAILY   folic acid (FOLVITE) 1 MG tablet  Take 1 mg by mouth daily.   gabapentin (NEURONTIN) 100 MG capsule Take 1 capsule (100 mg total) by mouth at bedtime.   glipiZIDE (GLUCOTROL) 5 MG tablet Take 1 tablet (5 mg total) by mouth 2 (two) times daily before a meal. (Patient taking differently: Take 5 mg by mouth daily at 6 (six) AM.)   losartan-hydrochlorothiazide (HYZAAR) 100-25 MG tablet TAKE 1 TABLET BY MOUTH DAILY   Menthol, Topical Analgesic, (BLUE-EMU MAXIMUM STRENGTH EX) Apply 1 Application topically daily as needed (pain).   methotrexate  (RHEUMATREX) 2.5 MG tablet Take 12.5 mg by mouth every Sunday. Caution:Chemotherapy. Protect from light. Take 5 tablets PO once weekly.   metoprolol tartrate (LOPRESSOR) 50 MG tablet TAKE 1 TABLET BY MOUTH TWICE  DAILY   naphazoline-pheniramine (VISINE) 0.025-0.3 % ophthalmic solution Place 1 drop into both eyes daily as needed for eye irritation.   omeprazole (PRILOSEC) 20 MG capsule Take 1 capsule by mouth once daily   ondansetron (ZOFRAN) 4 MG tablet Take 1 tablet (4 mg total) by mouth every 8 (eight) hours as needed for nausea or vomiting.   predniSONE (DELTASONE) 1 MG tablet Take 1 mg by mouth daily with breakfast.   rosuvastatin (CRESTOR) 10 MG tablet TAKE 1 TABLET BY MOUTH DAILY   silodosin (RAPAFLO) 8 MG CAPS capsule TAKE 1 CAPSULE BY MOUTH AT  BEDTIME   triamcinolone cream (KENALOG) 0.1 % Apply 1 Application topically daily as needed (itching).     Allergies:   Ciprofloxacin, Mosquito (diagnostic), and Oxycodone-acetaminophen   ROS:   Please see the history of present illness.     All other systems reviewed and are negative.   Labs/Other Tests and Data Reviewed:    Recent Labs: 11/24/2021: ALT 29; BUN 19; Creatinine, Ser 1.31; Hemoglobin 12.0; Platelets 122; Potassium 4.6; Sodium 142; TSH 3.070   Recent Lipid Panel Lab Results  Component Value Date/Time   CHOL 135 11/24/2021 04:06 PM   TRIG 152 (H) 11/24/2021 04:06 PM   HDL 40 11/24/2021 04:06 PM   CHOLHDL 3.4 11/24/2021 04:06 PM   CHOLHDL 2.9 Ratio 07/16/2007 10:28 PM   LDLCALC 69 11/24/2021 04:06 PM    Wt Readings from Last 3 Encounters:  11/24/21 221 lb (100.2 kg)  10/26/21 227 lb (103 kg)  10/04/21 225 lb (102.1 kg)     Objective:    Vital Signs:  There were no vitals taken for this visit.     ASSESSMENT & PLAN:   Gastroenteritis Likely viral etiology Informed pt that symptoms is self-limiting but should call if symptoms of fever, blood or mucus are noted in his stool Encouraged pt to stay well hydrated  and increase his fluid intake Zofran was ordered for nausea  Time:   Today, I have spent 8 minutes reviewing the chart, including problem list, medications, and with the patient with telehealth technology discussing the above problems.   Medication Adjustments/Labs and Tests Ordered: Current medicines are reviewed at length with the patient today.  Concerns regarding medicines are outlined above.   Tests Ordered: No orders of the defined types were placed in this encounter.   Medication Changes: Meds ordered this encounter  Medications   ondansetron (ZOFRAN) 4 MG tablet    Sig: Take 1 tablet (4 mg total) by mouth every 8 (eight) hours as needed for nausea or vomiting.    Dispense:  20 tablet    Refill:  0     Note: This dictation was prepared with Dragon dictation along with smaller phrase technology.  Similar sounding words can be transcribed inadequately or may not be corrected upon review. Any transcriptional errors that result from this process are unintentional.      Disposition:  Follow up  Signed, Alvira Monday, FNP  12/29/2021 3:19 PM     [place]

## 2022-01-13 ENCOUNTER — Ambulatory Visit (INDEPENDENT_AMBULATORY_CARE_PROVIDER_SITE_OTHER): Payer: Medicare Other | Admitting: Internal Medicine

## 2022-01-13 ENCOUNTER — Encounter: Payer: Self-pay | Admitting: Internal Medicine

## 2022-01-13 VITALS — BP 122/78 | HR 57 | Resp 18 | Ht 69.0 in | Wt 217.8 lb

## 2022-01-13 DIAGNOSIS — E1142 Type 2 diabetes mellitus with diabetic polyneuropathy: Secondary | ICD-10-CM

## 2022-01-13 DIAGNOSIS — R5383 Other fatigue: Secondary | ICD-10-CM | POA: Insufficient documentation

## 2022-01-13 DIAGNOSIS — D509 Iron deficiency anemia, unspecified: Secondary | ICD-10-CM | POA: Diagnosis not present

## 2022-01-13 DIAGNOSIS — R944 Abnormal results of kidney function studies: Secondary | ICD-10-CM | POA: Insufficient documentation

## 2022-01-13 MED ORDER — GABAPENTIN 300 MG PO CAPS: 300.0000 mg | ORAL_CAPSULE | Freq: Every day | ORAL | 5 refills | Status: DC

## 2022-01-13 NOTE — Progress Notes (Signed)
Established Patient Office Visit  Subjective:  Patient ID: Jonathan [name], male    DOB: 1945/06/15  Age: 76 y.o. MRN: [id number]  CC:  Chief Complaint  Patient presents with   Fatigue    Patient has been feeling weak and dizzy for 1 week since 01-06-22 his back has been weak as well     HPI Jonathan [name] is a 76 y.o. male with past medical history of HTN, DM, GERD, RA, CKD stage 3, BPH and obesity who presents for complaint of fatigue and dizziness for the last 1 week.  He had episodes of diarrhea/gastroenteritis about 2 weeks ago, which has resolved now.  He had fatigue and dizziness since then.  Of note, he takes glipizide for type II DM, and has not been checking his blood glucose recently.  He is on prednisone for RA, and his dose of prednisone has been reduced to 1 mg daily now by his rheumatologist.  He denies any recent change in appetite, chronic cough, weight loss, night sweats or melena/hematochezia. His BP was wnl today.  He has history of diabetic neuropathy, and his podiatrist advised him to take gabapentin 100 mg 3 times daily instead of nightly.  Past Medical History:  Diagnosis Date   Arthritis    ra, sees dr Sedonia Small   Coronary artery disease    saw dr zachery in [place] 20 yrs ago, released from cardiology   Diabetes mellitus without complication ([place])    type 2   GERD (gastroesophageal reflux disease)    Hypercholesteremia    Hypertension    Urethral stricture     Past Surgical History:  Procedure Laterality Date   BACK SURGERY     x 3 lower back   CARDIAC CATHETERIZATION  2000   small amt plaque relased from cardiology 20 yrs ago   CHOLECYSTECTOMY     COLONOSCOPY WITH PROPOFOL N/A 10/26/2021   Procedure: COLONOSCOPY WITH PROPOFOL;  Surgeon: Eloise Harman, DO;  [facility];  Service: Endoscopy;  Laterality: N/A;  To arrive at [place] N/A 04/17/2019   Procedure: CYSTOSCOPY WITH URETHRAL  DILATATION, CYSTOGRAM;  Surgeon: Ceasar Mons, MD;  [facility];  Service: Urology;  Laterality: N/A;   EYE SURGERY Bilateral    cataracts   IR RADIOLOGIST EVAL & MGMT  07/12/2021   POLYPECTOMY  10/26/2021   Procedure: POLYPECTOMY INTESTINAL;  Surgeon: Eloise Harman, DO;  [facility];  Service: Endoscopy;;   urethral stricture surgery  15 yrs ago    Family History  Problem Relation Age of Onset   Heart Problems Father     Social History   Socioeconomic History   Marital status: Married    Spouse name: Not on file   Number of children: Not on file   Years of education: Not on file   Highest education level: Not on file  Occupational History   Not on file  Tobacco Use   Smoking status: Never   Smokeless tobacco: Never  Vaping Use   Vaping Use: Never used  Substance and Sexual Activity   Alcohol use: No   Drug use: No   Sexual activity: Not on file  Other Topics Concern   Not on file  Social History Narrative   Not on file   Social Determinants of Health   Financial Resource Strain: Low Risk  (11/15/2021)   Overall Financial Resource Strain (CARDIA)  Difficulty of Paying Living Expenses: Not hard at all  Food Insecurity: No Food Insecurity (11/15/2021)   Hunger Vital Sign    Worried About Running Out of Food in the Last Year: Never true    Ran Out of Food in the Last Year: Never true  Transportation Needs: No Transportation Needs (11/15/2021)   PRAPARE - Hydrologist (Medical): No    Lack of Transportation (Non-Medical): No  Physical Activity: Sufficiently Active (11/15/2021)   Exercise Vital Sign    Days of Exercise per Week: 5 days    Minutes of Exercise per Session: 30 min  Stress: No Stress Concern Present (11/15/2021)   [place]    Feeling of Stress : Not at all  Social Connections: Moderately Integrated  (11/15/2021)   Social Connection and Isolation Panel [NHANES]    Frequency of Communication with Friends and Family: Three times a week    Frequency of Social Gatherings with Friends and Family: Twice a week    Attends Religious Services: More than 4 times per year    Active Member of [employer] or Organizations: No    Attends Archivist Meetings: Never    Marital Status: Married  Human resources officer Violence: Not At Risk (11/15/2021)   Humiliation, Afraid, Rape, and Kick questionnaire    Fear of Current or Ex-Partner: No    Emotionally Abused: No    Physically Abused: No    Sexually Abused: No    Outpatient Medications Prior to Visit  Medication Sig Dispense Refill   amLODipine (NORVASC) 10 MG tablet TAKE 1 TABLET BY MOUTH DAILY 100 tablet 2   aspirin EC 81 MG tablet Take 81 mg by mouth daily. Swallow whole.     Cyanocobalamin 2500 MCG CHEW Chew by mouth daily.     finasteride (PROSCAR) 5 MG tablet TAKE 1 TABLET BY MOUTH DAILY 341 tablet 2   folic acid (FOLVITE) 1 MG tablet Take 1 mg by mouth daily.     glipiZIDE (GLUCOTROL) 5 MG tablet Take 1 tablet (5 mg total) by mouth 2 (two) times daily before a meal. (Patient taking differently: Take 5 mg by mouth daily at 6 (six) AM.) 30 tablet 0   losartan-hydrochlorothiazide (HYZAAR) 100-25 MG tablet TAKE 1 TABLET BY MOUTH DAILY 100 tablet 2   Menthol, Topical Analgesic, (BLUE-EMU MAXIMUM STRENGTH EX) Apply 1 Application topically daily as needed (pain).     methotrexate (RHEUMATREX) 2.5 MG tablet Take 12.5 mg by mouth every Sunday. Caution:Chemotherapy. Protect from light. Take 5 tablets PO once weekly.     metoprolol tartrate (LOPRESSOR) 50 MG tablet TAKE 1 TABLET BY MOUTH TWICE  DAILY 200 tablet 2   naphazoline-pheniramine (VISINE) 0.025-0.3 % ophthalmic solution Place 1 drop into both eyes daily as needed for eye irritation.     omeprazole (PRILOSEC) 20 MG capsule Take 1 capsule by mouth once daily 30 capsule 3   ondansetron (ZOFRAN) 4 MG  tablet Take 1 tablet (4 mg total) by mouth every 8 (eight) hours as needed for nausea or vomiting. 20 tablet 0   predniSONE (DELTASONE) 1 MG tablet Take 1 mg by mouth daily with breakfast.     rosuvastatin (CRESTOR) 10 MG tablet TAKE 1 TABLET BY MOUTH DAILY 90 tablet 3   silodosin (RAPAFLO) 8 MG CAPS capsule TAKE 1 CAPSULE BY MOUTH AT  BEDTIME 100 capsule 2   terbinafine (LAMISIL) 250 MG tablet Take 250 mg by mouth daily.  triamcinolone cream (KENALOG) 0.1 % Apply 1 Application topically daily as needed (itching). 80 g 1   gabapentin (NEURONTIN) 100 MG capsule Take 1 capsule (100 mg total) by mouth at bedtime. (Patient taking differently: Take 100 mg by mouth at bedtime. Foot dr told him to take three times daily) 30 capsule 3   No facility-administered medications prior to visit.    Allergies  Allergen Reactions   Ciprofloxacin Itching   Mosquito (Diagnostic) Itching   Oxycodone-Acetaminophen Rash    ROS Review of Systems  Constitutional:  Positive for fatigue. Negative for chills and fever.  HENT:  Negative for congestion and sore throat.   Eyes:  Negative for pain and discharge.  Respiratory:  Negative for cough and shortness of breath.   Cardiovascular:  Negative for chest pain and palpitations.  Gastrointestinal:  Negative for constipation, diarrhea, nausea and vomiting.  Endocrine: Negative for polydipsia and polyuria.  Genitourinary:  Negative for dysuria and hematuria.  Musculoskeletal:  Positive for arthralgias and back pain. Negative for neck pain and neck stiffness.  Skin:  Negative for rash.  Neurological:  Positive for dizziness and numbness. Negative for weakness and headaches.  Psychiatric/Behavioral:  Negative for agitation and behavioral problems.       Objective:    Physical Exam Vitals reviewed.  Constitutional:      General: He is not in acute distress.    Appearance: He is obese. He is not diaphoretic.  HENT:     Head: Normocephalic and atraumatic.      Nose: Nose normal.     Mouth/Throat:     Mouth: Mucous membranes are moist.  Eyes:     General: No scleral icterus.    Extraocular Movements: Extraocular movements intact.  Cardiovascular:     Rate and Rhythm: Normal rate and regular rhythm.     Heart sounds: Normal heart sounds. No murmur heard. Pulmonary:     Breath sounds: Normal breath sounds. No wheezing or rales.  Musculoskeletal:     Cervical back: Neck supple. No tenderness.     Right lower leg: No edema.     Left lower leg: No edema.  Skin:    General: Skin is warm.     Findings: No rash.  Neurological:     General: No focal deficit present.     Mental Status: He is alert and oriented to person, place, and time.     Cranial Nerves: No cranial nerve deficit.     Sensory: No sensory deficit.     Motor: No weakness.  Psychiatric:        Mood and Affect: Mood normal.        Behavior: Behavior normal.     BP 122/78 (BP Location: Left Arm, Patient Position: Sitting, Cuff Size: Normal)   Pulse (!) 57   Resp 18   Ht _0  (1.753 m)   Wt 217 lb 12.8 oz (98.8 kg)   SpO2 94%   BMI 32.16 kg/m  Wt Readings from Last 3 Encounters:  01/13/22 217 lb 12.8 oz (98.8 kg)  11/24/21 221 lb (100.2 kg)  10/26/21 227 lb (103 kg)    Lab Results  Component Value Date   TSH 3.070 11/24/2021   Lab Results  Component Value Date   WBC 5.0 11/24/2021   HGB 12.0 (L) 11/24/2021   HCT 35.6 (L) 11/24/2021   MCV 88 11/24/2021   PLT 122 (L) 11/24/2021   Lab Results  Component Value Date   NA 142 11/24/2021  K 4.6 11/24/2021   CO2 22 11/24/2021   GLUCOSE 93 11/24/2021   BUN 19 11/24/2021   CREATININE 1.31 (H) 11/24/2021   BILITOT 0.4 11/24/2021   ALKPHOS 103 11/24/2021   AST 39 11/24/2021   ALT 29 11/24/2021   PROT 7.6 11/24/2021   ALBUMIN 3.7 (L) 11/24/2021   CALCIUM 8.9 11/24/2021   ANIONGAP 5 10/25/2021   EGFR 57 (L) 11/24/2021   Lab Results  Component Value Date   CHOL 135 11/24/2021   Lab Results  Component  Value Date   HDL 40 11/24/2021   Lab Results  Component Value Date   LDLCALC 69 11/24/2021   Lab Results  Component Value Date   TRIG 152 (H) 11/24/2021   Lab Results  Component Value Date   CHOLHDL 3.4 11/24/2021   Lab Results  Component Value Date   HGBA1C 6.9 (H) 11/24/2021      Assessment & Plan:   Problem List Items Addressed This Visit       Endocrine   Diabetic polyneuropathy associated with type 2 diabetes mellitus ([place]) - Primary    His toe pain could be due to diabetic neuropathy as well Increased gabapentin to 300 mg qHS      Relevant Medications   gabapentin (NEURONTIN) 300 MG capsule     Other   Iron deficiency anemia    Advised to take iron supplement - ferrous sulfate 325 mg QD No signs of active bleeding currently      Other fatigue    Chronic fatigue, recent worsening could be due to dehydration and/or hypoglycemia Had diarrhea recently, now resolved Takes glipizide, but does not check blood glucose - advised to check blood glucose to avoid severe hypoglycemia Has RA and type II DM, can cause fatigue Also on chronic steroid and beta-blocker - can cause fatigue TSH and CMP overall wnl        Meds ordered this encounter  Medications   gabapentin (NEURONTIN) 300 MG capsule    Sig: Take 1 capsule (300 mg total) by mouth at bedtime.    Dispense:  30 capsule    Refill:  5    Dose change    Follow-up: Return if symptoms worsen or fail to improve.    [name], MD

## 2022-01-13 NOTE — Assessment & Plan Note (Addendum)
Chronic fatigue, recent worsening could be due to dehydration and/or hypoglycemia Had diarrhea recently, now resolved Takes glipizide, but does not check blood glucose - advised to check blood glucose to avoid severe hypoglycemia Has RA and type II DM, can cause fatigue Also on chronic steroid and beta-blocker - can cause fatigue TSH and CMP overall wnl

## 2022-01-13 NOTE — Assessment & Plan Note (Signed)
Advised to take iron supplement - ferrous sulfate 325 mg QD No signs of active bleeding currently

## 2022-01-13 NOTE — Assessment & Plan Note (Signed)
His toe pain could be due to diabetic neuropathy as well Increased gabapentin to 300 mg qHS

## 2022-01-13 NOTE — Patient Instructions (Signed)
Please start taking Gabapentin 300 mg at nighttime.  Please start taking ferrous sulfate 325 mg once daily.  Please maintain adequate hydration by taking at least 64 ounces of fluid in a day.  Please check your blood glucose at least once a day before breakfast and when you have dizziness and if it is less than 70, please contact us after taking snack or cracker.

## 2022-01-20 ENCOUNTER — Encounter: Payer: Self-pay | Admitting: Medical

## 2022-01-20 ENCOUNTER — Ambulatory Visit: Payer: Medicare Other | Attending: Medical | Admitting: Medical

## 2022-01-20 VITALS — BP 124/66 | HR 78 | Ht 69.0 in | Wt 220.4 lb

## 2022-01-20 DIAGNOSIS — I739 Peripheral vascular disease, unspecified: Secondary | ICD-10-CM | POA: Diagnosis not present

## 2022-01-20 DIAGNOSIS — E782 Mixed hyperlipidemia: Secondary | ICD-10-CM

## 2022-01-20 DIAGNOSIS — I10 Essential (primary) hypertension: Secondary | ICD-10-CM

## 2022-01-20 NOTE — Patient Instructions (Signed)
Medication Instructions:   Your physician recommends that you continue on your current medications as directed. Please refer to the Current Medication list given to you today.  *If you need a refill on your cardiac medications before your next appointment, please call your pharmacy*   Follow-Up: At [facility], you and your health needs are our priority.  As part of our continuing mission to provide you with exceptional heart care, we have created designated Provider Care Teams.  These Care Teams include your primary Cardiologist (physician) and Advanced Practice Providers (APPs -  Physician Assistants and Nurse Practitioners) who all work together to provide you with the care you need, when you need it.  We recommend signing up for the patient portal called "MyChart".  Sign up information is provided on this After Visit Summary.  MyChart is used to connect with patients for Virtual Visits (Telemedicine).  Patients are able to view lab/test results, encounter notes, upcoming appointments, etc.  Non-urgent messages can be sent to your provider as well.   To learn more about what you can do with MyChart, go to [email or web address].    Your next appointment:   3 month(s)  The format for your next appointment:   In Person  Provider:   Kathlyn Sacramento, MD    Other Instructions   Important Information About Sugar

## 2022-01-20 NOTE — Progress Notes (Signed)
Cardiology Office Note:    Date:  01/20/2022   ID:  Jonathan [name], DOB 03/30/1946, MRN [id number]  PCP:  [name], MD  [facility] Cardiologist:  None  [facility] Electrophysiologist:  None   Referring MD: [name], MD   Chief Complaint: 6 month follow-up  History of Present Illness:    Jonathan [name] is a 76 y.o. male with a hx of PAD, HTN, DM2, GERD, rheumatoid arthritis, CKD, and obesity who presents for 6 month follow-up.   He was seen by cardiology in the past in [place] and [place] but subsequently released. He reported prior cardiac cath that showed mild nonobstructive disease.   He was seen 06/2021 for lower extremity pain, sharp toe pain and numbness in both feet. He underwent noninvasive vascular evaluation recently which showed moderately reduced ABI bilaterally at 0.65.  Duplex showed possible occluded peroneal artery bilaterally but no other obstructive disease.Exercise was recommended.   Today, the patient reports he still having toe pain. He saw podiatry last week and was told he had neuropathy and increased gabapentin. He denies wounds, and no claudication symptoms. He denies chet pain or shortness of breath. He is not doing any activity. He reports he is fairly active. He feels pain is not worse. He is also having trouble with balance at times and is using a cane.   Past Medical History:  Diagnosis Date   Arthritis    ra, sees dr Sedonia Small   Coronary artery disease    saw dr zachery in [place] 20 yrs ago, released from cardiology   Diabetes mellitus without complication ([place])    type 2   GERD (gastroesophageal reflux disease)    Hypercholesteremia    Hypertension    Urethral stricture     Past Surgical History:  Procedure Laterality Date   BACK SURGERY     x 3 lower back   CARDIAC CATHETERIZATION  2000   small amt plaque relased from cardiology 20 yrs ago   CHOLECYSTECTOMY     COLONOSCOPY WITH PROPOFOL N/A 10/26/2021    Procedure: COLONOSCOPY WITH PROPOFOL;  Surgeon: Eloise Harman, DO;  [facility];  Service: Endoscopy;  Laterality: N/A;  To arrive at [place] N/A 04/17/2019   Procedure: CYSTOSCOPY WITH URETHRAL DILATATION, CYSTOGRAM;  Surgeon: Ceasar Mons, MD;  [facility]: [facility];  Service: Urology;  Laterality: N/A;   EYE SURGERY Bilateral    cataracts   IR RADIOLOGIST EVAL & MGMT  07/12/2021   POLYPECTOMY  10/26/2021   Procedure: POLYPECTOMY INTESTINAL;  Surgeon: Eloise Harman, DO;  [facility];  Service: Endoscopy;;   urethral stricture surgery  15 yrs ago    Current Medications: Current Meds  Medication Sig   amLODipine (NORVASC) 10 MG tablet TAKE 1 TABLET BY MOUTH DAILY   gabapentin (NEURONTIN) 300 MG capsule Take 1 capsule (300 mg total) by mouth at bedtime.   terbinafine (LAMISIL) 250 MG tablet Take 250 mg by mouth daily.     Allergies:   Ciprofloxacin, Mosquito (diagnostic), and Oxycodone-acetaminophen   Social History   Socioeconomic History   Marital status: Married    Spouse name: Not on file   Number of children: Not on file   Years of education: Not on file   Highest education level: Not on file  Occupational History   Not on file  Tobacco Use   Smoking status: Never   Smokeless  tobacco: Never  Vaping Use   Vaping Use: Never used  Substance and Sexual Activity   Alcohol use: No   Drug use: No   Sexual activity: Not on file  Other Topics Concern   Not on file  Social History Narrative   Not on file   Social Determinants of Health   Financial Resource Strain: Low Risk  (11/15/2021)   Overall Financial Resource Strain (CARDIA)    Difficulty of Paying Living Expenses: Not hard at all  Food Insecurity: No Food Insecurity (11/15/2021)   Hunger Vital Sign    Worried About Running Out of Food in the Last Year: Never true    Ran Out of Food in the Last Year: Never true   Transportation Needs: No Transportation Needs (11/15/2021)   PRAPARE - Hydrologist (Medical): No    Lack of Transportation (Non-Medical): No  Physical Activity: Sufficiently Active (11/15/2021)   Exercise Vital Sign    Days of Exercise per Week: 5 days    Minutes of Exercise per Session: 30 min  Stress: No Stress Concern Present (11/15/2021)   [place]    Feeling of Stress : Not at all  Social Connections: Moderately Integrated (11/15/2021)   Social Connection and Isolation Panel [NHANES]    Frequency of Communication with Friends and Family: Three times a week    Frequency of Social Gatherings with Friends and Family: Twice a week    Attends Religious Services: More than 4 times per year    Active Member of [employer] or Organizations: No    Attends Archivist Meetings: Never    Marital Status: Married     Family History: The patient's family history includes Heart Problems in his father.  ROS:   Please see the history of present illness.     All other systems reviewed and are negative.  EKGs/Labs/Other Studies Reviewed:    The following studies were reviewed today:  ABIs 06/2021 Summary:  Right: Total occlusion noted in the peroneal artery.   Left: 30-49% stenosis noted in the superficial femoral artery. Total  occlusion noted in the peroneal artery.    SEE ARTERIAL DOPPLER EXAM OF THE SAME DATE.      See table(s) above for measurements and observations.    Vascular consult recommended.   Lower extremity Vascular US 06/2021 Summary:  Right: Resting right ankle-brachial index indicates moderate right lower  extremity arterial disease. The right toe-brachial index is abnormal.   Left: Resting left ankle-brachial index indicates moderate left lower  extremity arterial disease. The left toe-brachial index is abnormal  EKG:  EKG is ordered today.  The ekg ordered  today demonstrates NSR 1st degree AV block, LAD, nonspecific T wave changes  Recent Labs: 11/24/2021: ALT 29; BUN 19; Creatinine, Ser 1.31; Hemoglobin 12.0; Platelets 122; Potassium 4.6; Sodium 142; TSH 3.070  Recent Lipid Panel    Component Value Date/Time   CHOL 135 11/24/2021 1606   TRIG 152 (H) 11/24/2021 1606   HDL 40 11/24/2021 1606   CHOLHDL 3.4 11/24/2021 1606   CHOLHDL 2.9 Ratio 07/16/2007 2228   VLDL 20 07/16/2007 2228   LDLCALC 69 11/24/2021 1606    Physical Exam:    VS:  BP 124/66 (BP Location: Left Arm, Patient Position: Sitting, Cuff Size: Normal)   Pulse 78   Ht '5\' 9"'$  (1.753 m)   Wt 220 lb 6.4 oz (100 kg)   SpO2 98%  BMI 32.55 kg/m     Wt Readings from Last 3 Encounters:  01/20/22 220 lb 6.4 oz (100 kg)  01/13/22 217 lb 12.8 oz (98.8 kg)  11/24/21 221 lb (100.2 kg)     GEN:  Well nourished, well developed in no acute distress HEENT: Normal NECK: No JVD; No carotid bruits LYMPHATICS: No lymphadenopathy CARDIAC: RRR, no murmurs, rubs, gallops RESPIRATORY:  Clear to auscultation without rales, wheezing or rhonchi  ABDOMEN: Soft, non-tender, non-distended MUSCULOSKELETAL:  No edema; No deformity  SKIN: Warm and dry NEUROLOGIC:  Alert and oriented x 3 PSYCHIATRIC:  Normal affect   ASSESSMENT:    1. PAD (peripheral artery disease) ([place])   2. Hyperlipidemia, mixed   3. Essential hypertension    PLAN:    In order of problems listed above:  PAD Patient reports he is still having toe pain, suspected combination of PAD and diabetic neuropathy. He is taking gabapentin. He reports pain is not worse. Prior lower extremity US reviewed. B/l pulse are difficult to palpate. Angiography was discussed, but patient would like to wait at this time and revisit in 3 months with Dr. Fletcher Anon. Continue Aspirin and Crestor.   HLD LDL 69. Continue Crestor '10mg'$  daily.   HTN BP is good today. Continue amlodipine, Hyzaar and Lopressor.  Disposition: Follow up in 3 month(s)  with MD/APP    Signed, Ibrahima Holberg Ninfa Meeker, PA-C  01/20/2022 11:01 AM    [place]

## 2022-02-03 LAB — HM DIABETES EYE EXAM

## 2022-03-02 ENCOUNTER — Telehealth: Payer: Self-pay

## 2022-03-02 ENCOUNTER — Other Ambulatory Visit: Payer: Self-pay | Admitting: *Deleted

## 2022-03-02 ENCOUNTER — Telehealth: Payer: Self-pay | Admitting: Internal Medicine

## 2022-03-02 DIAGNOSIS — N400 Enlarged prostate without lower urinary tract symptoms: Secondary | ICD-10-CM

## 2022-03-02 MED ORDER — FINASTERIDE 5 MG PO TABS: 5.0000 mg | ORAL_TABLET | Freq: Every day | ORAL | 2 refills | Status: DC

## 2022-03-02 MED ORDER — SILODOSIN 8 MG PO CAPS: 8.0000 mg | ORAL_CAPSULE | Freq: Every day | ORAL | 2 refills | Status: DC

## 2022-03-02 NOTE — Telephone Encounter (Signed)
Medication sent to pharmacy  

## 2022-03-02 NOTE — Telephone Encounter (Signed)
Pt called stating he is needing a refill on his prostate medication. States he threw the bottle away and does not know what the name is. Can you please refill his prostate medication?   [facility]

## 2022-03-02 NOTE — Telephone Encounter (Signed)
Patient called back and need med refill  silodosin (RAPAFLO) 8 MG CAPS capsule  Pharmacy: [facility]

## 2022-03-03 ENCOUNTER — Telehealth: Payer: Self-pay | Admitting: Internal Medicine

## 2022-03-03 NOTE — Telephone Encounter (Signed)
Pt advised with verbal understanding  °

## 2022-03-03 NOTE — Telephone Encounter (Signed)
Spoke with patient just having blood when he wipes he is having bright red blood only when he wipes this started 02-28-22 please advise

## 2022-03-03 NOTE — Telephone Encounter (Signed)
Pt has appt tomorrow at 11 am. He states he thinks he has hemorrhoids. He is having some bleeding & pain. He wanted to be seen today but we are booked. Wants to know if there is something that can be done in the meantime till his appt tomorrow? He does not want to go to urgent care or er.

## 2022-03-04 ENCOUNTER — Ambulatory Visit (INDEPENDENT_AMBULATORY_CARE_PROVIDER_SITE_OTHER): Payer: Medicare Other | Admitting: Internal Medicine

## 2022-03-04 ENCOUNTER — Encounter: Payer: Self-pay | Admitting: Internal Medicine

## 2022-03-04 VITALS — BP 118/78 | HR 66 | Resp 18 | Ht 69.0 in | Wt 221.6 lb

## 2022-03-04 DIAGNOSIS — K602 Anal fissure, unspecified: Secondary | ICD-10-CM

## 2022-03-04 DIAGNOSIS — K648 Other hemorrhoids: Secondary | ICD-10-CM

## 2022-03-04 MED ORDER — HYDROCORTISONE ACETATE 25 MG RE SUPP: 25.0000 mg | Freq: Two times a day (BID) | RECTAL | 0 refills | Status: DC

## 2022-03-04 NOTE — Patient Instructions (Addendum)
Please use Anusol suppository for rectal discomfort and bleeding.  Please use wet wipes for now to avoid irritation.  Please start taking Colace/Docusate 100 mg once or twice daily until you have soft bowel movements.  Please maintain at least 64 ounces of fluid intake.

## 2022-03-04 NOTE — Progress Notes (Signed)
Acute Office Visit  Subjective:    Patient ID: Jonathan [name], male    DOB: March 21, 1946, 76 y.o.   MRN: [id number]  Chief Complaint  Patient presents with   Hemorrhoids    Pt has been having trouble out of hemorrhoids since 02-21-22 when he sits down and gets up feels like insides coming out     HPI Patient is in today for complaint of noticing blood when tissue paper upon wiping after BM.  He denies any melena or hematochezia currently.  He has mild rectal discomfort and has noticed something bulging out through rectum while bending and in standing position.  He has tried OTC hemorrhoid cream without much relief.  He reports hard stools and straining at times while passing stool.  Past Medical History:  Diagnosis Date   Arthritis    ra, sees dr Sedonia Small   Coronary artery disease    saw dr zachery in [place] 20 yrs ago, released from cardiology   Diabetes mellitus without complication ([place])    type 2   GERD (gastroesophageal reflux disease)    Hypercholesteremia    Hypertension    Urethral stricture     Past Surgical History:  Procedure Laterality Date   BACK SURGERY     x 3 lower back   CARDIAC CATHETERIZATION  2000   small amt plaque relased from cardiology 20 yrs ago   CHOLECYSTECTOMY     COLONOSCOPY WITH PROPOFOL N/A 10/26/2021   Procedure: COLONOSCOPY WITH PROPOFOL;  Surgeon: Eloise Harman, DO;  [facility];  Service: Endoscopy;  Laterality: N/A;  To arrive at [place] N/A 04/17/2019   Procedure: CYSTOSCOPY WITH URETHRAL DILATATION, CYSTOGRAM;  Surgeon: Ceasar Mons, MD;  Location: [facility];  Service: Urology;  Laterality: N/A;   EYE SURGERY Bilateral    cataracts   IR RADIOLOGIST EVAL & MGMT  07/12/2021   POLYPECTOMY  10/26/2021   Procedure: POLYPECTOMY INTESTINAL;  Surgeon: Eloise Harman, DO;  [facility]: [facility];  Service: Endoscopy;;   urethral stricture surgery  15  yrs ago    Family History  Problem Relation Age of Onset   Heart Problems Father     Social History   Socioeconomic History   Marital status: Married    Spouse name: Not on file   Number of children: Not on file   Years of education: Not on file   Highest education level: Not on file  Occupational History   Not on file  Tobacco Use   Smoking status: Never   Smokeless tobacco: Never  Vaping Use   Vaping Use: Never used  Substance and Sexual Activity   Alcohol use: No   Drug use: No   Sexual activity: Not on file  Other Topics Concern   Not on file  Social History Narrative   Not on file   Social Determinants of Health   Financial Resource Strain: Low Risk  (11/15/2021)   Overall Financial Resource Strain (CARDIA)    Difficulty of Paying Living Expenses: Not hard at all  Food Insecurity: No Food Insecurity (11/15/2021)   Hunger Vital Sign    Worried About Running Out of Food in the Last Year: Never true    Ran Out of Food in the Last Year: Never true  Transportation Needs: No Transportation Needs (11/15/2021)   PRAPARE - Hydrologist (Medical): No    Lack of Transportation (  Non-Medical): No  Physical Activity: Sufficiently Active (11/15/2021)   Exercise Vital Sign    Days of Exercise per Week: 5 days    Minutes of Exercise per Session: 30 min  Stress: No Stress Concern Present (11/15/2021)   [place]    Feeling of Stress : Not at all  Social Connections: Moderately Integrated (11/15/2021)   Social Connection and Isolation Panel [NHANES]    Frequency of Communication with Friends and Family: Three times a week    Frequency of Social Gatherings with Friends and Family: Twice a week    Attends Religious Services: More than 4 times per year    Active Member of [employer] or Organizations: No    Attends Archivist Meetings: Never    Marital Status: Married  Arboriculturist Violence: Not At Risk (11/15/2021)   Humiliation, Afraid, Rape, and Kick questionnaire    Fear of Current or Ex-Partner: No    Emotionally Abused: No    Physically Abused: No    Sexually Abused: No    Outpatient Medications Prior to Visit  Medication Sig Dispense Refill   amLODipine (NORVASC) 10 MG tablet TAKE 1 TABLET BY MOUTH DAILY 100 tablet 2   aspirin EC 81 MG tablet Take 81 mg by mouth daily. Swallow whole.     Cyanocobalamin 2500 MCG CHEW Chew by mouth daily.     finasteride (PROSCAR) 5 MG tablet Take 1 tablet (5 mg total) by mouth daily. 830 tablet 2   folic acid (FOLVITE) 1 MG tablet Take 1 mg by mouth daily.     gabapentin (NEURONTIN) 300 MG capsule Take 1 capsule (300 mg total) by mouth at bedtime. 30 capsule 5   glipiZIDE (GLUCOTROL) 5 MG tablet Take 1 tablet (5 mg total) by mouth 2 (two) times daily before a meal. (Patient taking differently: Take 5 mg by mouth daily at 6 (six) AM.) 30 tablet 0   losartan-hydrochlorothiazide (HYZAAR) 100-25 MG tablet TAKE 1 TABLET BY MOUTH DAILY 100 tablet 2   Menthol, Topical Analgesic, (BLUE-EMU MAXIMUM STRENGTH EX) Apply 1 Application topically daily as needed (pain).     methotrexate (RHEUMATREX) 2.5 MG tablet Take 12.5 mg by mouth every Sunday. Caution:Chemotherapy. Protect from light. Take 5 tablets PO once weekly.     metoprolol tartrate (LOPRESSOR) 50 MG tablet TAKE 1 TABLET BY MOUTH TWICE  DAILY 200 tablet 2   naphazoline-pheniramine (VISINE) 0.025-0.3 % ophthalmic solution Place 1 drop into both eyes daily as needed for eye irritation.     omeprazole (PRILOSEC) 20 MG capsule Take 1 capsule by mouth once daily 30 capsule 3   ondansetron (ZOFRAN) 4 MG tablet Take 1 tablet (4 mg total) by mouth every 8 (eight) hours as needed for nausea or vomiting. 20 tablet 0   predniSONE (DELTASONE) 1 MG tablet Take 1 mg by mouth daily with breakfast.     rosuvastatin (CRESTOR) 10 MG tablet TAKE 1 TABLET BY MOUTH DAILY 90 tablet 3   silodosin  (RAPAFLO) 8 MG CAPS capsule Take 1 capsule (8 mg total) by mouth at bedtime. 100 capsule 2   terbinafine (LAMISIL) 250 MG tablet Take 250 mg by mouth daily.     triamcinolone cream (KENALOG) 0.1 % Apply 1 Application topically daily as needed (itching). 80 g 1   No facility-administered medications prior to visit.    Allergies  Allergen Reactions   Ciprofloxacin Itching   Mosquito (Diagnostic) Itching   Oxycodone-Acetaminophen Rash  Review of Systems  Constitutional:  Positive for fatigue. Negative for chills and fever.  HENT:  Negative for congestion and sore throat.   Eyes:  Negative for pain and discharge.  Respiratory:  Negative for cough and shortness of breath.   Cardiovascular:  Negative for chest pain and palpitations.  Gastrointestinal:  Positive for constipation. Negative for diarrhea, nausea and vomiting.       Anal area pain and discomfort  Endocrine: Negative for polydipsia and polyuria.  Genitourinary:  Negative for dysuria and hematuria.  Musculoskeletal:  Positive for arthralgias and back pain. Negative for neck pain and neck stiffness.  Skin:  Negative for rash.  Neurological:  Positive for dizziness and numbness. Negative for weakness and headaches.  Psychiatric/Behavioral:  Negative for agitation and behavioral problems.        Objective:    Physical Exam Vitals reviewed.  Constitutional:      General: He is not in acute distress.    Appearance: He is obese. He is not diaphoretic.  HENT:     Head: Normocephalic and atraumatic.     Nose: Nose normal.     Mouth/Throat:     Mouth: Mucous membranes are moist.  Eyes:     General: No scleral icterus.    Extraocular Movements: Extraocular movements intact.  Cardiovascular:     Rate and Rhythm: Normal rate and regular rhythm.     Heart sounds: Normal heart sounds. No murmur heard. Pulmonary:     Breath sounds: Normal breath sounds. No wheezing or rales.  Musculoskeletal:     Cervical back: Neck supple.  No tenderness.     Right lower leg: No edema.     Left lower leg: No edema.  Skin:    General: Skin is warm.     Findings: No rash.  Neurological:     General: No focal deficit present.     Mental Status: He is alert and oriented to person, place, and time.     Sensory: No sensory deficit.     Motor: No weakness.  Psychiatric:        Mood and Affect: Mood normal.        Behavior: Behavior normal.     BP 118/78 (BP Location: Right Arm, Patient Position: Sitting, Cuff Size: Normal)   Pulse 66   Resp 18   Ht '5\' 9"'$  (1.753 m)   Wt 221 lb 9.6 oz (100.5 kg)   SpO2 96%   BMI 32.72 kg/m  Wt Readings from Last 3 Encounters:  03/04/22 221 lb 9.6 oz (100.5 kg)  01/20/22 220 lb 6.4 oz (100 kg)  01/13/22 217 lb 12.8 oz (98.8 kg)        Assessment & Plan:   Problem List Items Addressed This Visit       Cardiovascular and Mediastinum   Internal hemorrhoids - Primary    Likely has internal hemorrhoids - Anusol suppository PRN Preparation H cream Also has anal fissure, advised sitz bath Maintain adequate hydration to avoid constipation Can take Senna or Colace PRN for constipation Hb stable Referred to GI      Relevant Medications   hydrocortisone (ANUSOL-HC) 25 MG suppository   Other Relevant Orders   Ambulatory referral to Gastroenterology   Other Visit Diagnoses     Anal fissure       Relevant Orders   Ambulatory referral to Gastroenterology        Meds ordered this encounter  Medications   hydrocortisone (ANUSOL-HC) 25 MG suppository  Sig: Place 1 suppository (25 mg total) rectally 2 (two) times daily.    Dispense:  12 suppository    Refill:  0     Ilijah Doucet Keith Rake, MD

## 2022-03-04 NOTE — Assessment & Plan Note (Signed)
Likely has internal hemorrhoids - Anusol suppository PRN Preparation H cream Also has anal fissure, advised sitz bath Maintain adequate hydration to avoid constipation Can take Senna or Colace PRN for constipation Hb stable Referred to GI

## 2022-03-09 ENCOUNTER — Encounter: Payer: Self-pay | Admitting: Internal Medicine

## 2022-03-09 ENCOUNTER — Ambulatory Visit (INDEPENDENT_AMBULATORY_CARE_PROVIDER_SITE_OTHER): Payer: Medicare Other | Admitting: Internal Medicine

## 2022-03-09 VITALS — BP 121/75 | HR 73 | Ht 69.0 in | Wt 220.8 lb

## 2022-03-09 DIAGNOSIS — W19XXXD Unspecified fall, subsequent encounter: Secondary | ICD-10-CM

## 2022-03-09 DIAGNOSIS — E1142 Type 2 diabetes mellitus with diabetic polyneuropathy: Secondary | ICD-10-CM

## 2022-03-09 DIAGNOSIS — Z23 Encounter for immunization: Secondary | ICD-10-CM | POA: Diagnosis not present

## 2022-03-09 DIAGNOSIS — Z09 Encounter for follow-up examination after completed treatment for conditions other than malignant neoplasm: Secondary | ICD-10-CM

## 2022-03-09 DIAGNOSIS — M1610 Unilateral primary osteoarthritis, unspecified hip: Secondary | ICD-10-CM | POA: Diagnosis not present

## 2022-03-09 MED ORDER — TRAMADOL HCL 50 MG PO TABS: 50.0000 mg | ORAL_TABLET | Freq: Three times a day (TID) | ORAL | 0 refills | Status: AC | PRN

## 2022-03-09 NOTE — Assessment & Plan Note (Signed)
His toe pain could be due to diabetic neuropathy as well Recently increased gabapentin to 300 mg qHS

## 2022-03-09 NOTE — Assessment & Plan Note (Signed)
X-ray of hip and knee showed arthritis Has chronic hip pain and LE weakness Referred to orthopedic surgeon Also has history of RA, followed by rheumatology

## 2022-03-09 NOTE — Progress Notes (Signed)
Acute Office Visit  Subjective:    Patient ID: Jonathan [name], male    DOB: [birth year], 76 y.o.   MRN: [id number]  Chief Complaint  Patient presents with   Fall    Fall 03/05/22 seen at ER but having pain in buttocks area and R shoulder patient states back feels weak    HPI Patient is in today for follow-up of recent ER visit after a fall.  He was at his wife's shop and was trying to get out through the door.  He reports that his left leg gave out and fell on his back on a cement floor.  He did not have his cane at the time of fall. He went to ER and had x-ray of hip and knee, which did not show any acute fracture.  He was given tramadol in the ER, which improved his pain.  He has bruising over buttock area and has been trying warm compresses for it.  He has history of rheumatoid arthritis and diabetic neuropathy.  Past Medical History:  Diagnosis Date   Arthritis    ra, sees dr Sedonia Small   Coronary artery disease    saw dr zachery in [place] 20 yrs ago, released from cardiology   Diabetes mellitus without complication ([place])    type 2   GERD (gastroesophageal reflux disease)    Hypercholesteremia    Hypertension    Urethral stricture     Past Surgical History:  Procedure Laterality Date   BACK SURGERY     x 3 lower back   CARDIAC CATHETERIZATION  2000   small amt plaque relased from cardiology 20 yrs ago   CHOLECYSTECTOMY     COLONOSCOPY WITH PROPOFOL N/A 10/26/2021   Procedure: COLONOSCOPY WITH PROPOFOL;  Surgeon: Eloise Harman, DO;  [facility];  Service: Endoscopy;  Laterality: N/A;  To arrive at [place] N/A 04/17/2019   Procedure: CYSTOSCOPY WITH URETHRAL DILATATION, CYSTOGRAM;  Surgeon: Ceasar Mons, MD;  Location: [facility];  Service: Urology;  Laterality: N/A;   EYE SURGERY Bilateral    cataracts   IR RADIOLOGIST EVAL & MGMT  07/12/2021   POLYPECTOMY  10/26/2021   Procedure:  POLYPECTOMY INTESTINAL;  Surgeon: Eloise Harman, DO;  [facility];  Service: Endoscopy;;   urethral stricture surgery  15 yrs ago    Family History  Problem Relation Age of Onset   Heart Problems Father     Social History   Socioeconomic History   Marital status: Married    Spouse name: Not on file   Number of children: Not on file   Years of education: Not on file   Highest education level: Not on file  Occupational History   Not on file  Tobacco Use   Smoking status: Never   Smokeless tobacco: Never  Vaping Use   Vaping Use: Never used  Substance and Sexual Activity   Alcohol use: No   Drug use: No   Sexual activity: Not on file  Other Topics Concern   Not on file  Social History Narrative   Not on file   Social Determinants of Health   Financial Resource Strain: Low Risk  (11/15/2021)   Overall Financial Resource Strain (CARDIA)    Difficulty of Paying Living Expenses: Not hard at all  Food Insecurity: No Food Insecurity (11/15/2021)   Hunger Vital Sign    Worried About Charity fundraiser in  the Last Year: Never true    [place] in the Last Year: Never true  Transportation Needs: No Transportation Needs (11/15/2021)   PRAPARE - Hydrologist (Medical): No    Lack of Transportation (Non-Medical): No  Physical Activity: Sufficiently Active (11/15/2021)   Exercise Vital Sign    Days of Exercise per Week: 5 days    Minutes of Exercise per Session: 30 min  Stress: No Stress Concern Present (11/15/2021)   [place]    Feeling of Stress : Not at all  Social Connections: Moderately Integrated (11/15/2021)   Social Connection and Isolation Panel [NHANES]    Frequency of Communication with Friends and Family: Three times a week    Frequency of Social Gatherings with Friends and Family: Twice a week    Attends Religious Services: More than 4 times per year     Active Member of [employer] or Organizations: No    Attends Archivist Meetings: Never    Marital Status: Married  Human resources officer Violence: Not At Risk (11/15/2021)   Humiliation, Afraid, Rape, and Kick questionnaire    Fear of Current or Ex-Partner: No    Emotionally Abused: No    Physically Abused: No    Sexually Abused: No    Outpatient Medications Prior to Visit  Medication Sig Dispense Refill   amLODipine (NORVASC) 10 MG tablet TAKE 1 TABLET BY MOUTH DAILY 100 tablet 2   aspirin EC 81 MG tablet Take 81 mg by mouth daily. Swallow whole.     Cyanocobalamin 2500 MCG CHEW Chew by mouth daily.     finasteride (PROSCAR) 5 MG tablet Take 1 tablet (5 mg total) by mouth daily. 096 tablet 2   folic acid (FOLVITE) 1 MG tablet Take 1 mg by mouth daily.     gabapentin (NEURONTIN) 300 MG capsule Take 1 capsule (300 mg total) by mouth at bedtime. 30 capsule 5   glipiZIDE (GLUCOTROL) 5 MG tablet Take 1 tablet (5 mg total) by mouth 2 (two) times daily before a meal. (Patient taking differently: Take 5 mg by mouth daily at 6 (six) AM.) 30 tablet 0   hydrocortisone (ANUSOL-HC) 25 MG suppository Place 1 suppository (25 mg total) rectally 2 (two) times daily. 12 suppository 0   losartan-hydrochlorothiazide (HYZAAR) 100-25 MG tablet TAKE 1 TABLET BY MOUTH DAILY 100 tablet 2   Menthol, Topical Analgesic, (BLUE-EMU MAXIMUM STRENGTH EX) Apply 1 Application topically daily as needed (pain).     methotrexate (RHEUMATREX) 2.5 MG tablet Take 12.5 mg by mouth every Sunday. Caution:Chemotherapy. Protect from light. Take 5 tablets PO once weekly.     metoprolol tartrate (LOPRESSOR) 50 MG tablet TAKE 1 TABLET BY MOUTH TWICE  DAILY 200 tablet 2   naphazoline-pheniramine (VISINE) 0.025-0.3 % ophthalmic solution Place 1 drop into both eyes daily as needed for eye irritation.     omeprazole (PRILOSEC) 20 MG capsule Take 1 capsule by mouth once daily 30 capsule 3   ondansetron (ZOFRAN) 4 MG tablet Take 1 tablet  (4 mg total) by mouth every 8 (eight) hours as needed for nausea or vomiting. 20 tablet 0   predniSONE (DELTASONE) 1 MG tablet Take 1 mg by mouth daily with breakfast.     rosuvastatin (CRESTOR) 10 MG tablet TAKE 1 TABLET BY MOUTH DAILY 90 tablet 3   silodosin (RAPAFLO) 8 MG CAPS capsule Take 1 capsule (8 mg total) by mouth at bedtime. 100  capsule 2   terbinafine (LAMISIL) 250 MG tablet Take 250 mg by mouth daily.     triamcinolone cream (KENALOG) 0.1 % Apply 1 Application topically daily as needed (itching). 80 g 1   No facility-administered medications prior to visit.    Allergies  Allergen Reactions   Ciprofloxacin Itching   Mosquito (Diagnostic) Itching   Oxycodone-Acetaminophen Rash    Review of Systems  Constitutional:  Positive for fatigue. Negative for chills and fever.  HENT:  Negative for congestion and sore throat.   Eyes:  Negative for pain and discharge.  Respiratory:  Negative for cough and shortness of breath.   Cardiovascular:  Negative for chest pain and palpitations.  Gastrointestinal:  Positive for constipation. Negative for diarrhea, nausea and vomiting.       Anal area pain and discomfort  Endocrine: Negative for polydipsia and polyuria.  Genitourinary:  Negative for dysuria and hematuria.  Musculoskeletal:  Positive for arthralgias and back pain. Negative for neck pain and neck stiffness.  Skin:  Positive for color change (Bruising over buttock area). Negative for rash.  Neurological:  Positive for dizziness and numbness. Negative for weakness and headaches.  Psychiatric/Behavioral:  Negative for agitation and behavioral problems.        Objective:    Physical Exam Vitals reviewed.  Constitutional:      General: He is not in acute distress.    Appearance: He is obese. He is not diaphoretic.  HENT:     Head: Normocephalic and atraumatic.     Nose: Nose normal.     Mouth/Throat:     Mouth: Mucous membranes are moist.  Eyes:     General: No scleral  icterus.    Extraocular Movements: Extraocular movements intact.  Cardiovascular:     Rate and Rhythm: Normal rate and regular rhythm.     Heart sounds: Normal heart sounds. No murmur heard. Pulmonary:     Breath sounds: Normal breath sounds. No wheezing or rales.  Musculoskeletal:        General: Tenderness (Left hip area, ROM at hip limited due to pain) present.     Cervical back: Neck supple. No tenderness.     Right lower leg: No edema.     Left lower leg: No edema.  Skin:    General: Skin is warm.     Findings: Bruising (Over b/l buttock area, about 3 cm in diameter on left buttock and 2 cm in diameter on right buttock) present.  Neurological:     General: No focal deficit present.     Mental Status: He is alert and oriented to person, place, and time.  Psychiatric:        Mood and Affect: Mood normal.        Behavior: Behavior normal.     BP 121/75 (BP Location: Right Arm, Patient Position: Sitting, Cuff Size: Normal)   Pulse 73   Ht '5\' 9"'$  (1.753 m)   Wt 220 lb 12.8 oz (100.2 kg)   SpO2 96%   BMI 32.61 kg/m  Wt Readings from Last 3 Encounters:  03/09/22 220 lb 12.8 oz (100.2 kg)  03/04/22 221 lb 9.6 oz (100.5 kg)  01/20/22 220 lb 6.4 oz (100 kg)        Assessment & Plan:   Problem List Items Addressed This Visit       Endocrine   Diabetic polyneuropathy associated with type 2 diabetes mellitus ([place])    His toe pain could be due to diabetic neuropathy as  well Recently increased gabapentin to 300 mg qHS        Musculoskeletal and Integument   Hip arthritis    X-ray of hip and knee showed arthritis Has chronic hip pain and LE weakness Referred to orthopedic surgeon Also has history of RA, followed by rheumatology      Relevant Medications   traMADol (ULTRAM) 50 MG tablet   Other Relevant Orders   Ambulatory referral to Orthopedic Surgery     Other   Encounter for examination following treatment at hospital    ER chart reviewed, including  imaging Hip and knee x-ray showed arthritis, but no acute fracture Fall likely due to LE weakness from lumbar radiculopathy and diabetic neuropathy Has hip pain, unable to take oral NSAIDs due to CKD - Tylenol for mild-to-moderate pain, tramadol PRN for severe pain Needs to use cane regularly      Other Visit Diagnoses     Fall, subsequent encounter    -  Primary   Relevant Medications   traMADol (ULTRAM) 50 MG tablet   Need for immunization against influenza       Relevant Orders   Flu Vaccine QUAD High Dose(Fluad) (Completed)        Meds ordered this encounter  Medications   traMADol (ULTRAM) 50 MG tablet    Sig: Take 1 tablet (50 mg total) by mouth every 8 (eight) hours as needed for up to 5 days.    Dispense:  15 tablet    Refill:  0     Kyanna Mahrt Keith Rake, MD

## 2022-03-09 NOTE — Assessment & Plan Note (Addendum)
ER chart reviewed, including imaging Hip and knee x-ray showed arthritis, but no acute fracture Fall likely due to LE weakness from lumbar radiculopathy and diabetic neuropathy Has hip pain, unable to take oral NSAIDs due to CKD - Tylenol for mild-to-moderate pain, tramadol PRN for severe pain Needs to use cane regularly

## 2022-03-09 NOTE — Patient Instructions (Signed)
Please use cane for walking support.  Please take Tramadol as needed for severe pain.  Please take Tylenol as needed for mild-moderate pain.

## 2022-03-15 ENCOUNTER — Ambulatory Visit (INDEPENDENT_AMBULATORY_CARE_PROVIDER_SITE_OTHER): Payer: Medicare Other

## 2022-03-15 ENCOUNTER — Ambulatory Visit (INDEPENDENT_AMBULATORY_CARE_PROVIDER_SITE_OTHER): Payer: Medicare Other | Admitting: Orthopedic Surgery

## 2022-03-15 ENCOUNTER — Encounter: Payer: Self-pay | Admitting: Orthopedic Surgery

## 2022-03-15 VITALS — BP 138/67 | HR 66 | Ht 69.0 in | Wt 223.0 lb

## 2022-03-15 DIAGNOSIS — G8929 Other chronic pain: Secondary | ICD-10-CM

## 2022-03-15 DIAGNOSIS — M25552 Pain in left hip: Secondary | ICD-10-CM

## 2022-03-15 DIAGNOSIS — M1612 Unilateral primary osteoarthritis, left hip: Secondary | ICD-10-CM | POA: Diagnosis not present

## 2022-03-15 DIAGNOSIS — M545 Low back pain, unspecified: Secondary | ICD-10-CM

## 2022-03-15 DIAGNOSIS — M1610 Unilateral primary osteoarthritis, unspecified hip: Secondary | ICD-10-CM

## 2022-03-15 NOTE — Patient Instructions (Signed)
Recommend PT for your back  If pain in the groin worsens, we can discuss an injection

## 2022-03-16 ENCOUNTER — Encounter: Payer: Self-pay | Admitting: Orthopedic Surgery

## 2022-03-16 ENCOUNTER — Ambulatory Visit (INDEPENDENT_AMBULATORY_CARE_PROVIDER_SITE_OTHER): Payer: Medicare Other | Admitting: Gastroenterology

## 2022-03-16 ENCOUNTER — Encounter: Payer: Self-pay | Admitting: Gastroenterology

## 2022-03-16 VITALS — BP 129/78 | HR 76 | Temp 97.8°F | Ht 69.0 in | Wt 223.4 lb

## 2022-03-16 DIAGNOSIS — K648 Other hemorrhoids: Secondary | ICD-10-CM | POA: Diagnosis not present

## 2022-03-16 DIAGNOSIS — R198 Other specified symptoms and signs involving the digestive system and abdomen: Secondary | ICD-10-CM | POA: Diagnosis not present

## 2022-03-16 DIAGNOSIS — R58 Hemorrhage, not elsewhere classified: Secondary | ICD-10-CM | POA: Insufficient documentation

## 2022-03-16 MED ORDER — HYDROCORTISONE (PERIANAL) 2.5 % EX CREA: 1.0000 | TOPICAL_CREAM | Freq: Two times a day (BID) | CUTANEOUS | 1 refills | Status: AC

## 2022-03-16 NOTE — Patient Instructions (Signed)
Try to refrain from using laxatives, Linzess, or anti-diarrhea medication for now.  Start a good fiber supplement. You need to get at least 3-4 grams of fiber in a supplement each day. Avoid fiber tablets, capsules. You can use Fiber gummies, chewables, powder. I prefer Fiber Choice or Benefiber.  Please complete labs and stool tests.

## 2022-03-16 NOTE — Progress Notes (Signed)
New Patient Visit  Assessment: Jonathan [name] is a 76 y.o. male with the following: 1. Pain in left hip, Hip arthritis; mild to moderate with minimal symptoms currently 3. Chronic low back pain, unspecified back pain laterality, unspecified whether sciatica present  Plan: Jonathan [name] has pain primarily in the buttock, wrapping around the lateral aspect of the right hip, after recent fall.  Radiographs were reviewed of the right hip, which demonstrates mild to moderate degenerative changes overall.  Limited pain in his groin.  He does have a history of multiple lower back surgeries, and I am concerned that his current symptoms are associated with his lower back.  He states that he has 2 lean forward when walking to minimize the pain.  He is using a cane.  We discussed multiple treatment options including referral back to a neurosurgeon for further evaluation versus a new referral in [place], or trying some physical therapy.  He is not interested in surgery.  As such, we will refer him to physical therapy to work on strengthening, and gait training.  Follow-up as needed.  Follow-up: Return if symptoms worsen or fail to improve.  Subjective:  Chief Complaint  Patient presents with   Hip Pain    Bilat hip pain after a fall DOI 03/05/22    History of Present Illness: Jonathan [name] is a 76 y.o. male who has been referred by  [name], MD for evaluation of right hip pain.  He states he fell within the last couple of weeks, and since then he has had pain in both hips.  He states the pain is primarily in the posterior lateral aspect of the right hip, radiating around the front of his leg.  He is using a cane to assist with ambulation.  Medications are not helping.  He does have a history of multiple lower back surgeries, with the last being completed approximately 20 years ago at [facility] in [place].  He denies pain in his groin.  When he walks, he states he has to lean  forward to alleviate the discomfort.   Review of Systems: No fevers or chills No numbness or tingling No chest pain No shortness of breath No bowel or bladder dysfunction No GI distress No headaches   Medical History:  Past Medical History:  Diagnosis Date   Arthritis    ra, sees dr Sedonia Small   Coronary artery disease    saw dr zachery in [place] 20 yrs ago, released from cardiology   Diabetes mellitus without complication ([place])    type 2   GERD (gastroesophageal reflux disease)    Hypercholesteremia    Hypertension    Urethral stricture     Past Surgical History:  Procedure Laterality Date   BACK SURGERY     x 3 lower back   CARDIAC CATHETERIZATION  2000   small amt plaque relased from cardiology 20 yrs ago   CHOLECYSTECTOMY     COLONOSCOPY WITH PROPOFOL N/A 10/26/2021   Procedure: COLONOSCOPY WITH PROPOFOL;  Surgeon: Eloise Harman, DO;  [facility];  Service: Endoscopy;  Laterality: N/A;  To arrive at [place] N/A 04/17/2019   Procedure: CYSTOSCOPY WITH URETHRAL DILATATION, CYSTOGRAM;  Surgeon: Ceasar Mons, MD;  Location: [facility];  Service: Urology;  Laterality: N/A;   EYE SURGERY Bilateral    cataracts   IR RADIOLOGIST EVAL & MGMT  07/12/2021   POLYPECTOMY  10/26/2021  Procedure: POLYPECTOMY INTESTINAL;  Surgeon: Eloise Harman, DO;  Location: [facility];  Service: Endoscopy;;   urethral stricture surgery  15 yrs ago    Family History  Problem Relation Age of Onset   Heart Problems Father    Social History   Tobacco Use   Smoking status: Never   Smokeless tobacco: Never  Vaping Use   Vaping Use: Never used  Substance Use Topics   Alcohol use: No   Drug use: No    Allergies  Allergen Reactions   Ciprofloxacin Itching   Mosquito (Diagnostic) Itching   Oxycodone-Acetaminophen Rash    No outpatient medications have been marked as taking for the 03/15/22  encounter (Office Visit) with Mordecai Rasmussen, MD.    Objective: BP 138/67   Pulse 66   Ht '5\' 9"'$  (1.753 m)   Wt 223 lb (101.2 kg)   BMI 32.93 kg/m   Physical Exam:  General: Elderly male.  Alert and oriented.  No acute distress. Gait: Slow, steady gait.  Uses a cane to assist with ambulation.  Ambulates with a forward leaning posture.  Mild tenderness to palpation along the lower back.  Minimal tenderness to palpation of the greater trochanter on the right side.  He tolerates internal and external rotation of the right hip without discomfort in his groin.  Negative straight leg raise bilaterally.    IMAGING: I personally ordered and reviewed the following images  AP pelvis was obtained in clinic today.  These were compared to prior x-rays.  No acute injuries are noted.  Mild degenerative changes noted in both hips.  Mild osteophytes are appreciated in the lateral aspect of the acetabulum.  No evidence of a healing fracture within the pubic rami.  Impression: AP pelvis with mild hip joint degenerative changes bilaterally.   New Medications:  No orders of the defined types were placed in this encounter.     Mordecai Rasmussen, MD  03/16/2022 2:24 PM

## 2022-03-16 NOTE — Progress Notes (Signed)
GI Office Note    Referring Provider: [name], MD Primary Care Physician:  [name], MD  Primary Gastroenterologist: Elon Alas. Abbey Chatters, DO   Chief Complaint   Chief Complaint  Patient presents with   Abdominal Pain    Diarrhea sometimes and constipation sometimes. Has been going on for about 6 months. Stomach makes noise all the time.      History of Present Illness   Jonathan [name] is a 76 y.o. male presenting today for further evaluation of hemorrhoids at the request of Dr. Posey Pronto. Patient was last seen 09/2021 for screening colonoscopy.   Patient reports bowel issues for about six months. Symptoms occurring before his colonoscopy. He alternated between constipation and diarrhea but feels like his diarrhea is predominant. Somewhat of difficult history. He has been on Linzess 124mg daily in the past, none in couple of months. States it caused diarrhea. At times takes antidiarrhea medication, recently taking Kaopectate. Stool turned black. He can have BM every other day but days he has diarrhea, he may go numerous times. Stomach rumbles all the time. He denies abdominal cramping or pain. He notes something stools slip out if he bends over or lifts something. Usually loose stool when it happens. Denies nocturnal stools.  He denies brbpr. He has pressure in the bottom. Burns at times. PrepH externally has helped.     CT A/P without contrast IMPRESSION: 1. No evidence of urinary tract calculus, mass, or hydronephrosis. No evidence of urinary tract filling defect on delayed phase imaging. 2. Thickening of the urinary bladder wall, likely related to chronic outlet obstruction although infectious or inflammatory cystitis is a differential consideration in the setting of hematuria. 3. Prostatomegaly.  Colonoscopy 09/2021: - Non-bleeding internal hemorrhoids. - Diverticulosis in the sigmoid colon. - One 2 mm polyp in the ascending colon, removed with a jumbo cold  forceps. Resected and retrieved. - One 4 mm polyp in the ascending colon, removed with a cold snare. Resected and retrieved. - One 12 mm polyp in the transverse colon, removed with a cold snare. Resected and retrieved. -path tubular adenomas -next colonoscopy 3 years.     Medications   Current Outpatient Medications  Medication Sig Dispense Refill   amLODipine (NORVASC) 10 MG tablet TAKE 1 TABLET BY MOUTH DAILY 100 tablet 2   aspirin EC 81 MG tablet Take 81 mg by mouth daily. Swallow whole.     finasteride (PROSCAR) 5 MG tablet Take 1 tablet (5 mg total) by mouth daily. 1578tablet 2   folic acid (FOLVITE) 1 MG tablet Take 1 mg by mouth daily.     gabapentin (NEURONTIN) 300 MG capsule Take 1 capsule (300 mg total) by mouth at bedtime. 30 capsule 5   glipiZIDE (GLUCOTROL) 5 MG tablet Take 1 tablet (5 mg total) by mouth 2 (two) times daily before a meal. (Patient taking differently: Take 5 mg by mouth daily at 6 (six) AM.) 30 tablet 0   losartan-hydrochlorothiazide (HYZAAR) 100-25 MG tablet TAKE 1 TABLET BY MOUTH DAILY 100 tablet 2   Menthol, Topical Analgesic, (BLUE-EMU MAXIMUM STRENGTH EX) Apply 1 Application topically daily as needed (pain).     methotrexate (RHEUMATREX) 2.5 MG tablet Take 12.5 mg by mouth every Sunday. Caution:Chemotherapy. Protect from light. Take 5 tablets PO once weekly.     metoprolol tartrate (LOPRESSOR) 50 MG tablet TAKE 1 TABLET BY MOUTH TWICE  DAILY 200 tablet 2   omeprazole (PRILOSEC) 20 MG capsule Take 1 capsule  by mouth once daily 30 capsule 3   predniSONE (DELTASONE) 1 MG tablet Take 1 mg by mouth daily with breakfast.     rosuvastatin (CRESTOR) 10 MG tablet TAKE 1 TABLET BY MOUTH DAILY 90 tablet 3   silodosin (RAPAFLO) 8 MG CAPS capsule Take 1 capsule (8 mg total) by mouth at bedtime. 100 capsule 2   triamcinolone cream (KENALOG) 0.1 % Apply 1 Application topically daily as needed (itching). 80 g 1   No current facility-administered medications for this  visit.    Allergies   Allergies as of 03/16/2022 - Review Complete 03/16/2022  Allergen Reaction Noted   Ciprofloxacin Itching 10/26/2020   Mosquito (diagnostic) Itching 10/19/2021   Oxycodone-acetaminophen Rash 04/06/2006       Review of Systems   General: Negative for anorexia, weight loss, fever, chills, fatigue, weakness. ENT: Negative for hoarseness, difficulty swallowing , nasal congestion. CV: Negative for chest pain, angina, palpitations, dyspnea on exertion, peripheral edema.  Respiratory: Negative for dyspnea at rest, dyspnea on exertion, cough, sputum, wheezing.  GI: See history of present illness otherwise negative. GU:  Negative for dysuria, hematuria, urinary incontinence, urinary frequency, nocturnal urination.  Endo: Negative for unusual weight change.     Physical Exam   BP 129/78 (BP Location: Left Arm, Patient Position: Sitting, Cuff Size: Normal)   Pulse 76   Temp 97.8 F (36.6 C) (Oral)   Ht '5\' 9"'$  (1.753 m)   Wt 223 lb 6.4 oz (101.3 kg)   SpO2 100%   BMI 32.99 kg/m    General: Well-nourished, well-developed in no acute distress.  Eyes: No icterus. Mouth: Oropharyngeal mucosa moist and pink   Abdomen: Bowel sounds are normal, nontender, nondistended, no hepatosplenomegaly or masses,  no abdominal bruits or hernia , no rebound or guarding.  Rectal: not performed Extremities: No lower extremity edema. No clubbing or deformities. Neuro: Alert and oriented x 4   Skin: Warm and dry, no jaundice.   Psych: Alert and cooperative, normal mood and affect.  Labs   Lab Results  Component Value Date   CREATININE 1.31 (H) 11/24/2021   BUN 19 11/24/2021   NA 142 11/24/2021   K 4.6 11/24/2021   CL 105 11/24/2021   CO2 22 11/24/2021   Lab Results  Component Value Date   ALT 29 11/24/2021   AST 39 11/24/2021   ALKPHOS 103 11/24/2021   BILITOT 0.4 11/24/2021   Lab Results  Component Value Date   WBC 5.0 11/24/2021   HGB 12.0 (L) 11/24/2021   HCT  35.6 (L) 11/24/2021   MCV 88 11/24/2021   PLT 122 (L) 11/24/2021   Lab Results  Component Value Date   HGBA1C 6.9 (H) 11/24/2021   Lab Results  Component Value Date   TSH 3.070 11/24/2021   No results found for: "IRON", "TIBC", "FERRITIN" No results found for: "VITAMINB12" No results found for: "FOLATE"  Labs from March 14, 2022: White blood cell count 4300, hemoglobin 12.5, hematocrit 37.4, MCV 91, platelets 140,000, glucose 63, BUN 31, creatinine 1.69, estimated GFR 42, albumin 3.8, total bilirubin 0.6, alkaline phosphatase 99, AST 30, ALT 19  Imaging Studies   No results found.  Assessment   Internal hemorrhoids: exacerbated by bowel issues.   Alternating constipation/diarrhea: tendency towards diarrhea. Notes postprandial urgency. No nocturnal stools. Does take antidiarrhea medications which may be contributing to episodes of constipation. He notes he has also been on Linzess but none in couple of months. We will screen for celiac, given his  history of autoimmune diseases. Also rule out infectious process. Recent colonoscopy reassuring.   Mild normocytic anemia: no overt bleeding noted. Likely anemia of chronic disease in setting of RA, CKD. He also has mild thrombocytopenia, no splenomegaly on CT imaging last year.  No known liver disease.  Possibly medication related. Patient states rheumatologist working on getting him off methotrexate.    PLAN   TTG, IgA.  Serum IgA.  GI profile. Start daily fiber supplement, 3 to 4 g/day. Refrain from laxatives and antidiarrheals. Continue to have CBC follow by PCP/rheumatology.    Laureen Ochs. Bobby Rumpf, [place], [facility]

## 2022-03-26 ENCOUNTER — Other Ambulatory Visit: Payer: Self-pay | Admitting: Internal Medicine

## 2022-03-28 ENCOUNTER — Encounter: Payer: Self-pay | Admitting: Internal Medicine

## 2022-03-28 ENCOUNTER — Ambulatory Visit (INDEPENDENT_AMBULATORY_CARE_PROVIDER_SITE_OTHER): Payer: Medicare Other | Admitting: Internal Medicine

## 2022-03-28 VITALS — BP 106/66 | HR 80 | Ht 69.0 in | Wt 220.2 lb

## 2022-03-28 DIAGNOSIS — Z09 Encounter for follow-up examination after completed treatment for conditions other than malignant neoplasm: Secondary | ICD-10-CM

## 2022-03-28 DIAGNOSIS — E1142 Type 2 diabetes mellitus with diabetic polyneuropathy: Secondary | ICD-10-CM

## 2022-03-28 DIAGNOSIS — R198 Other specified symptoms and signs involving the digestive system and abdomen: Secondary | ICD-10-CM

## 2022-03-28 DIAGNOSIS — I10 Essential (primary) hypertension: Secondary | ICD-10-CM

## 2022-03-28 DIAGNOSIS — K219 Gastro-esophageal reflux disease without esophagitis: Secondary | ICD-10-CM

## 2022-03-28 DIAGNOSIS — E1121 Type 2 diabetes mellitus with diabetic nephropathy: Secondary | ICD-10-CM | POA: Diagnosis not present

## 2022-03-28 DIAGNOSIS — N1831 Chronic kidney disease, stage 3a: Secondary | ICD-10-CM

## 2022-03-28 DIAGNOSIS — N3000 Acute cystitis without hematuria: Secondary | ICD-10-CM | POA: Diagnosis not present

## 2022-03-28 DIAGNOSIS — M069 Rheumatoid arthritis, unspecified: Secondary | ICD-10-CM

## 2022-03-28 MED ORDER — OMEPRAZOLE 20 MG PO CPDR: 20.0000 mg | DELAYED_RELEASE_CAPSULE | Freq: Every day | ORAL | 5 refills | Status: DC

## 2022-03-28 NOTE — Assessment & Plan Note (Signed)
Recent episode, on Macrobid Had referred to Urology for evaluation as patient has microscopic hematuria and h/o urethral stricture - needs to follow-up for recurrent UTI

## 2022-03-28 NOTE — Patient Instructions (Addendum)
Please continue taking medications as prescribed.  Please continue to follow low carb diet and ambulate as tolerated.  Please contact Urology office at [phone].

## 2022-03-28 NOTE — Assessment & Plan Note (Signed)
Recently seen by GI Was advised to take fiber supplement

## 2022-03-28 NOTE — Progress Notes (Signed)
Established Patient Office Visit  Subjective:  Patient ID: Jonathan [name], male    DOB: 02-22-1946  Age: 76 y.o. MRN: [id number]  CC:  Chief Complaint  Patient presents with   Follow-up    Seen at ER on 03/26/22 treated for UTI patient states medication isnt helping and has given him diarrhea     HPI Jonathan [name] is a 76 y.o. male with past medical history of HTN, DM, GERD, RA, CKD stage 3, BPH and obesity who presents for f/u of his chronic medical conditions.  He went to ER at [facility] for urinary frequency.  He was given Macrobid for UTI.  His urine culture showed E faecalis.  He has seen urology in the past for recurrent UTI.  He currently denies any dysuria or hematuria.  Denies any fever, chills, nausea or vomiting.  He reports loose BM with Macrobid, but agrees to continue for now.  HTN: BP is well-controlled. Takes medications regularly. Patient denies headache, dizziness, chest pain, dyspnea or palpitations.   Type II DM: His HbA1C was 6.9 in 07/23.  He has been taking glipizide once daily. His blood glucose has been running between 80-120 now with Glipizide 5 mg QD.  Denies any fatigue, polyuria or polydipsia.  He also has a history of CKD stage III.  He has been taking Crestor for HLD.  RA: He takes methotrexate and low-dose oral prednisone for it.    Past Medical History:  Diagnosis Date   Arthritis    ra, sees dr Sedonia Small   Coronary artery disease    saw dr zachery in [place] 20 yrs ago, released from cardiology   Diabetes mellitus without complication ([place])    type 2   GERD (gastroesophageal reflux disease)    Hypercholesteremia    Hypertension    Urethral stricture     Past Surgical History:  Procedure Laterality Date   BACK SURGERY     x 3 lower back   CARDIAC CATHETERIZATION  2000   small amt plaque relased from cardiology 20 yrs ago   CHOLECYSTECTOMY     COLONOSCOPY WITH PROPOFOL N/A 10/26/2021   Procedure: COLONOSCOPY WITH PROPOFOL;   Surgeon: Eloise Harman, DO;  [facility];  Service: Endoscopy;  Laterality: N/A;  To arrive at [place] N/A 04/17/2019   Procedure: CYSTOSCOPY WITH URETHRAL DILATATION, CYSTOGRAM;  Surgeon: Ceasar Mons, MD;  Location: [facility];  Service: Urology;  Laterality: N/A;   EYE SURGERY Bilateral    cataracts   IR RADIOLOGIST EVAL & MGMT  07/12/2021   POLYPECTOMY  10/26/2021   Procedure: POLYPECTOMY INTESTINAL;  Surgeon: Eloise Harman, DO;  [facility];  Service: Endoscopy;;   urethral stricture surgery  15 yrs ago    Family History  Problem Relation Age of Onset   Heart Problems Father     Social History   Socioeconomic History   Marital status: Married    Spouse name: Not on file   Number of children: Not on file   Years of education: Not on file   Highest education level: Not on file  Occupational History   Not on file  Tobacco Use   Smoking status: Never   Smokeless tobacco: Never  Vaping Use   Vaping Use: Never used  Substance and Sexual Activity   Alcohol use: No   Drug use: No   Sexual activity: Not on file  Other  Topics Concern   Not on file  Social History Narrative   Not on file   Social Determinants of Health   Financial Resource Strain: Low Risk  (11/15/2021)   Overall Financial Resource Strain (CARDIA)    Difficulty of Paying Living Expenses: Not hard at all  Food Insecurity: No Food Insecurity (11/15/2021)   Hunger Vital Sign    Worried About Running Out of Food in the Last Year: Never true    Ran Out of Food in the Last Year: Never true  Transportation Needs: No Transportation Needs (11/15/2021)   PRAPARE - Hydrologist (Medical): No    Lack of Transportation (Non-Medical): No  Physical Activity: Sufficiently Active (11/15/2021)   Exercise Vital Sign    Days of Exercise per Week: 5 days    Minutes of Exercise per Session: 30 min   Stress: No Stress Concern Present (11/15/2021)   [place]    Feeling of Stress : Not at all  Social Connections: Moderately Integrated (11/15/2021)   Social Connection and Isolation Panel [NHANES]    Frequency of Communication with Friends and Family: Three times a week    Frequency of Social Gatherings with Friends and Family: Twice a week    Attends Religious Services: More than 4 times per year    Active Member of [employer] or Organizations: No    Attends Archivist Meetings: Never    Marital Status: Married  Human resources officer Violence: Not At Risk (11/15/2021)   Humiliation, Afraid, Rape, and Kick questionnaire    Fear of Current or Ex-Partner: No    Emotionally Abused: No    Physically Abused: No    Sexually Abused: No    Outpatient Medications Prior to Visit  Medication Sig Dispense Refill   amLODipine (NORVASC) 10 MG tablet TAKE 1 TABLET BY MOUTH DAILY 100 tablet 2   aspirin EC 81 MG tablet Take 81 mg by mouth daily. Swallow whole.     finasteride (PROSCAR) 5 MG tablet Take 1 tablet (5 mg total) by mouth daily. 950 tablet 2   folic acid (FOLVITE) 1 MG tablet Take 1 mg by mouth daily.     gabapentin (NEURONTIN) 300 MG capsule Take 1 capsule (300 mg total) by mouth at bedtime. 30 capsule 5   glipiZIDE (GLUCOTROL) 5 MG tablet Take 1 tablet (5 mg total) by mouth 2 (two) times daily before a meal. (Patient taking differently: Take 5 mg by mouth daily at 6 (six) AM.) 30 tablet 0   hydrocortisone (ANUSOL-HC) 2.5 % rectal cream Place 1 Application rectally 2 (two) times daily. For 10 days. May repeat cycle if needed. 30 g 1   losartan-hydrochlorothiazide (HYZAAR) 100-25 MG tablet TAKE 1 TABLET BY MOUTH DAILY 100 tablet 2   Menthol, Topical Analgesic, (BLUE-EMU MAXIMUM STRENGTH EX) Apply 1 Application topically daily as needed (pain).     methotrexate (RHEUMATREX) 2.5 MG tablet Take 12.5 mg by mouth every Sunday.  Caution:Chemotherapy. Protect from light. Take 5 tablets PO once weekly.     metoprolol tartrate (LOPRESSOR) 50 MG tablet TAKE 1 TABLET BY MOUTH TWICE  DAILY 200 tablet 2   nitrofurantoin, macrocrystal-monohydrate, (MACROBID) 100 MG capsule Take 100 mg by mouth 2 (two) times daily.     predniSONE (DELTASONE) 1 MG tablet Take 1 mg by mouth daily with breakfast.     rosuvastatin (CRESTOR) 10 MG tablet TAKE 1 TABLET BY MOUTH DAILY 90 tablet 3  silodosin (RAPAFLO) 8 MG CAPS capsule Take 1 capsule (8 mg total) by mouth at bedtime. 100 capsule 2   triamcinolone cream (KENALOG) 0.1 % Apply 1 Application topically daily as needed (itching). 80 g 1   omeprazole (PRILOSEC) 20 MG capsule Take 1 capsule by mouth once daily 30 capsule 0   No facility-administered medications prior to visit.    Allergies  Allergen Reactions   Ciprofloxacin Itching   Mosquito (Diagnostic) Itching   Oxycodone-Acetaminophen Rash    ROS Review of Systems  Constitutional:  Positive for fatigue. Negative for chills and fever.  HENT:  Negative for congestion and sore throat.   Eyes:  Negative for pain and discharge.  Respiratory:  Negative for cough and shortness of breath.   Cardiovascular:  Negative for chest pain and palpitations.  Gastrointestinal:  Positive for constipation. Negative for nausea and vomiting.       Anal area pain and discomfort  Endocrine: Negative for polydipsia and polyuria.  Genitourinary:  Negative for dysuria and hematuria.  Musculoskeletal:  Positive for arthralgias and back pain. Negative for neck pain and neck stiffness.  Skin:  Negative for rash.  Neurological:  Positive for numbness. Negative for weakness and headaches.  Psychiatric/Behavioral:  Negative for agitation and behavioral problems.       Objective:    Physical Exam Vitals reviewed.  Constitutional:      General: He is not in acute distress.    Appearance: He is obese. He is not diaphoretic.  HENT:     Head:  Normocephalic and atraumatic.     Nose: Nose normal.     Mouth/Throat:     Mouth: Mucous membranes are moist.  Eyes:     General: No scleral icterus.    Extraocular Movements: Extraocular movements intact.  Cardiovascular:     Rate and Rhythm: Normal rate and regular rhythm.     Heart sounds: Normal heart sounds. No murmur heard. Pulmonary:     Breath sounds: Normal breath sounds. No wheezing or rales.  Musculoskeletal:        General: Tenderness (Left hip area, ROM at hip limited due to pain) present.     Cervical back: Neck supple. No tenderness.     Right lower leg: No edema.     Left lower leg: No edema.  Skin:    General: Skin is warm.     Findings: No rash.  Neurological:     General: No focal deficit present.     Mental Status: He is alert and oriented to person, place, and time.  Psychiatric:        Mood and Affect: Mood normal.        Behavior: Behavior normal.     BP 106/66 (BP Location: Right Arm, Patient Position: Sitting, Cuff Size: Large)   Pulse 80   Ht _0  (1.753 m)   Wt 220 lb 3.2 oz (99.9 kg)   SpO2 94%   BMI 32.52 kg/m  Wt Readings from Last 3 Encounters:  03/28/22 220 lb 3.2 oz (99.9 kg)  03/16/22 223 lb 6.4 oz (101.3 kg)  03/15/22 223 lb (101.2 kg)    Lab Results  Component Value Date   TSH 3.070 11/24/2021   Lab Results  Component Value Date   WBC 5.0 11/24/2021   HGB 12.0 (L) 11/24/2021   HCT 35.6 (L) 11/24/2021   MCV 88 11/24/2021   PLT 122 (L) 11/24/2021   Lab Results  Component Value Date   NA 142 11/24/2021  K 4.6 11/24/2021   CO2 22 11/24/2021   GLUCOSE 93 11/24/2021   BUN 19 11/24/2021   CREATININE 1.31 (H) 11/24/2021   BILITOT 0.4 11/24/2021   ALKPHOS 103 11/24/2021   AST 39 11/24/2021   ALT 29 11/24/2021   PROT 7.6 11/24/2021   ALBUMIN 3.7 (L) 11/24/2021   CALCIUM 8.9 11/24/2021   ANIONGAP 5 10/25/2021   EGFR 57 (L) 11/24/2021   Lab Results  Component Value Date   CHOL 135 11/24/2021   Lab Results   Component Value Date   HDL 40 11/24/2021   Lab Results  Component Value Date   LDLCALC 69 11/24/2021   Lab Results  Component Value Date   TRIG 152 (H) 11/24/2021   Lab Results  Component Value Date   CHOLHDL 3.4 11/24/2021   Lab Results  Component Value Date   HGBA1C 6.9 (H) 11/24/2021      Assessment & Plan:   Problem List Items Addressed This Visit       Cardiovascular and Mediastinum   Essential hypertension    BP Readings from Last 1 Encounters:  03/28/22 106/66  Overall well-controlled with Amlodipine, Losartan-HCTZ and Metoprolol Counseled for compliance with the medications Advised DASH diet and moderate exercise/walking, at least 150 mins/week        Digestive   GERD (gastroesophageal reflux disease)    Well controlled with omeprazole      Relevant Medications   omeprazole (PRILOSEC) 20 MG capsule     Endocrine   Type II diabetes mellitus with nephropathy (HCC) - Primary    Lab Results  Component Value Date   HGBA1C 6.9 (H) 11/24/2021  Well-controlled On Glipizide 5 mg QD Blood glucose at home has been around 80-120 Recently decreased dose of steroid as well, which should improve glycemic profile Advised to follow diabetic diet On ARB F/u CMP and HbA1C Diabetic eye exam: Advised to follow up with Ophthalmology for diabetic eye exam      Relevant Orders   Basic Metabolic Panel (BMET)   Hemoglobin A1c   Diabetic polyneuropathy associated with type 2 diabetes mellitus (HCC)    His toe pain could be due to diabetic neuropathy as well Recently increased gabapentin to 300 mg qHS        Musculoskeletal and Integument   Rheumatoid arthritis (HCC)    On Methotrexate and Prednisone F/u with Dr Amil Amen        Genitourinary   Chronic kidney disease, stage III (moderate) (HCC)   Relevant Orders   Basic Metabolic Panel (BMET)   CBC with Differential/Platelet   UTI (urinary tract infection)    Recent episode, on Macrobid Had referred to  Urology for evaluation as patient has microscopic hematuria and h/o urethral stricture - needs to follow-up for recurrent UTI      Relevant Medications   nitrofurantoin, macrocrystal-monohydrate, (MACROBID) 100 MG capsule     Other   Encounter for examination following treatment at hospital    ER chart reviewed, including urine culture Needs to complete Macrobid for UTI      Alternating constipation and diarrhea    Recently seen by GI Was advised to take fiber supplement       Meds ordered this encounter  Medications   omeprazole (PRILOSEC) 20 MG capsule    Sig: Take 1 capsule (20 mg total) by mouth daily.    Dispense:  30 capsule    Refill:  5    Follow-up: Return in about 4 months (around 07/27/2022) for  HTN and CKD.    [name], MD

## 2022-03-28 NOTE — Assessment & Plan Note (Signed)
ER chart reviewed, including urine culture Needs to complete Macrobid for UTI

## 2022-03-28 NOTE — Assessment & Plan Note (Signed)
His toe pain could be due to diabetic neuropathy as well Recently increased gabapentin to 300 mg qHS

## 2022-03-28 NOTE — Assessment & Plan Note (Addendum)
Lab Results  Component Value Date   HGBA1C 6.9 (H) 11/24/2021   Well-controlled On Glipizide 5 mg QD Blood glucose at home has been around 80-120 Recently decreased dose of steroid as well, which should improve glycemic profile Advised to follow diabetic diet On ARB F/u CMP and HbA1C Diabetic eye exam: Advised to follow up with Ophthalmology for diabetic eye exam

## 2022-03-28 NOTE — Assessment & Plan Note (Signed)
Well controlled with omeprazole. 

## 2022-03-28 NOTE — Assessment & Plan Note (Signed)
BP Readings from Last 1 Encounters:  03/28/22 106/66   Overall well-controlled with Amlodipine, Losartan-HCTZ and Metoprolol Counseled for compliance with the medications Advised DASH diet and moderate exercise/walking, at least 150 mins/week

## 2022-03-28 NOTE — Assessment & Plan Note (Signed)
On Methotrexate and Prednisone F/u with Dr Amil Amen

## 2022-03-30 LAB — CBC WITH DIFFERENTIAL/PLATELET
Basophils Absolute: 0 10*3/uL (ref 0.0–0.2)
Basos: 0 %
EOS (ABSOLUTE): 0.2 10*3/uL (ref 0.0–0.4)
Eos: 3 %
Hematocrit: 35.9 % — ABNORMAL LOW (ref 37.5–51.0)
Hemoglobin: 12.1 g/dL — ABNORMAL LOW (ref 13.0–17.7)
Immature Grans (Abs): 0 10*3/uL (ref 0.0–0.1)
Immature Granulocytes: 1 %
Lymphocytes Absolute: 1.2 10*3/uL (ref 0.7–3.1)
Lymphs: 19 %
MCH: 31 pg (ref 26.6–33.0)
MCHC: 33.7 g/dL (ref 31.5–35.7)
MCV: 92 fL (ref 79–97)
Monocytes Absolute: 1.1 10*3/uL — ABNORMAL HIGH (ref 0.1–0.9)
Monocytes: 17 %
Neutrophils Absolute: 3.9 10*3/uL (ref 1.4–7.0)
Neutrophils: 60 %
Platelets: 192 10*3/uL (ref 150–450)
RBC: 3.9 x10E6/uL — ABNORMAL LOW (ref 4.14–5.80)
RDW: 15.5 % — ABNORMAL HIGH (ref 11.6–15.4)
WBC: 6.5 10*3/uL (ref 3.4–10.8)

## 2022-03-30 LAB — BASIC METABOLIC PANEL
BUN/Creatinine Ratio: 19 (ref 10–24)
BUN: 37 mg/dL — ABNORMAL HIGH (ref 8–27)
CO2: 18 mmol/L — ABNORMAL LOW (ref 20–29)
Calcium: 9.2 mg/dL (ref 8.6–10.2)
Chloride: 104 mmol/L (ref 96–106)
Creatinine, Ser: 1.99 mg/dL — ABNORMAL HIGH (ref 0.76–1.27)
Glucose: 109 mg/dL — ABNORMAL HIGH (ref 70–99)
Potassium: 4 mmol/L (ref 3.5–5.2)
Sodium: 138 mmol/L (ref 134–144)
eGFR: 35 mL/min/{1.73_m2} — ABNORMAL LOW (ref >59)

## 2022-03-30 LAB — HEMOGLOBIN A1C
Est. average glucose Bld gHb Est-mCnc: 137 mg/dL
Hgb A1c MFr Bld: 6.4 % — ABNORMAL HIGH (ref 4.8–5.6)

## 2022-04-01 ENCOUNTER — Ambulatory Visit (INDEPENDENT_AMBULATORY_CARE_PROVIDER_SITE_OTHER): Payer: Medicare Other | Admitting: Urology

## 2022-04-01 VITALS — BP 103/70 | HR 79

## 2022-04-01 DIAGNOSIS — N3001 Acute cystitis with hematuria: Secondary | ICD-10-CM | POA: Diagnosis not present

## 2022-04-01 DIAGNOSIS — N400 Enlarged prostate without lower urinary tract symptoms: Secondary | ICD-10-CM

## 2022-04-01 DIAGNOSIS — R351 Nocturia: Secondary | ICD-10-CM

## 2022-04-01 LAB — URINALYSIS, ROUTINE W REFLEX MICROSCOPIC
Bilirubin, UA: NEGATIVE
Glucose, UA: NEGATIVE
Ketones, UA: NEGATIVE
Nitrite, UA: NEGATIVE
Specific Gravity, UA: 1.015 (ref 1.005–1.030)
Urobilinogen, Ur: 0.2 mg/dL (ref 0.2–1.0)
pH, UA: 5.5 (ref 5.0–7.5)

## 2022-04-01 LAB — MICROSCOPIC EXAMINATION: Bacteria, UA: NONE SEEN

## 2022-04-01 LAB — BLADDER SCAN AMB NON-IMAGING: Scan Result: 264

## 2022-04-01 MED ORDER — FINASTERIDE 5 MG PO TABS: 5.0000 mg | ORAL_TABLET | Freq: Every day | ORAL | 2 refills | Status: DC

## 2022-04-01 MED ORDER — SULFAMETHOXAZOLE-TRIMETHOPRIM 800-160 MG PO TABS: 1.0000 | ORAL_TABLET | Freq: Two times a day (BID) | ORAL | 0 refills | Status: DC

## 2022-04-01 MED ORDER — SILODOSIN 8 MG PO CAPS: 8.0000 mg | ORAL_CAPSULE | Freq: Two times a day (BID) | ORAL | 2 refills | Status: DC

## 2022-04-01 NOTE — Progress Notes (Signed)
04/01/2022 12:59 PM   Jonathan [name] 1945/07/10 [id number]  Referring provider: [name], MD [id number] [place],  [place] [id number]  Followup BPH   HPI: Jonathan [name] is a 76yo here for followup for BPH with Nocturia. IPSS 23 QOL 5 on rapaflo '8mg'$  daily. He noted over the past 4-5 days he has increased urinary frequency, urinary urgency, and dysuria. Urine stream fair. UA is concerning for infection.   PMH: Past Medical History:  Diagnosis Date   Arthritis    ra, sees dr Sedonia Small   Coronary artery disease    saw dr zachery in [place] 20 yrs ago, released from cardiology   Diabetes mellitus without complication ([place])    type 2   GERD (gastroesophageal reflux disease)    Hypercholesteremia    Hypertension    Urethral stricture     Surgical History: Past Surgical History:  Procedure Laterality Date   BACK SURGERY     x 3 lower back   CARDIAC CATHETERIZATION  2000   small amt plaque relased from cardiology 20 yrs ago   CHOLECYSTECTOMY     COLONOSCOPY WITH PROPOFOL N/A 10/26/2021   Procedure: COLONOSCOPY WITH PROPOFOL;  Surgeon: Eloise Harman, DO;  [facility];  Service: Endoscopy;  Laterality: N/A;  To arrive at [place] N/A 04/17/2019   Procedure: CYSTOSCOPY WITH URETHRAL DILATATION, CYSTOGRAM;  Surgeon: Ceasar Mons, MD;  [facility]: [facility];  Service: Urology;  Laterality: N/A;   EYE SURGERY Bilateral    cataracts   IR RADIOLOGIST EVAL & MGMT  07/12/2021   POLYPECTOMY  10/26/2021   Procedure: POLYPECTOMY INTESTINAL;  Surgeon: Eloise Harman, DO;  [facility];  Service: Endoscopy;;   urethral stricture surgery  15 yrs ago    Home Medications:  Allergies as of 04/01/2022       Reactions   Ciprofloxacin Itching   Mosquito (diagnostic) Itching   Oxycodone-acetaminophen Rash        Medication List        Accurate as of April 01, 2022 12:59 PM. If you  have any questions, ask your nurse or doctor.          amLODipine 10 MG tablet Commonly known as: NORVASC TAKE 1 TABLET BY MOUTH DAILY   aspirin EC 81 MG tablet Take 81 mg by mouth daily. Swallow whole.   BLUE-EMU MAXIMUM STRENGTH EX Apply 1 Application topically daily as needed (pain).   finasteride 5 MG tablet Commonly known as: PROSCAR Take 1 tablet (5 mg total) by mouth daily.   folic acid 1 MG tablet Commonly known as: FOLVITE Take 1 mg by mouth daily.   gabapentin 300 MG capsule Commonly known as: NEURONTIN Take 1 capsule (300 mg total) by mouth at bedtime.   glipiZIDE 5 MG tablet Commonly known as: GLUCOTROL Take 1 tablet (5 mg total) by mouth 2 (two) times daily before a meal. What changed: when to take this   hydrocortisone 2.5 % rectal cream Commonly known as: ANUSOL-HC Place 1 Application rectally 2 (two) times daily. For 10 days. May repeat cycle if needed.   losartan-hydrochlorothiazide 100-25 MG tablet Commonly known as: HYZAAR TAKE 1 TABLET BY MOUTH DAILY   methotrexate 2.5 MG tablet Commonly known as: RHEUMATREX Take 12.5 mg by mouth every Sunday. Caution:Chemotherapy. Protect from light. Take 5 tablets PO once weekly.   metoprolol tartrate 50 MG tablet Commonly known as: LOPRESSOR TAKE  1 TABLET BY MOUTH TWICE  DAILY   nitrofurantoin (macrocrystal-monohydrate) 100 MG capsule Commonly known as: MACROBID Take 100 mg by mouth 2 (two) times daily.   omeprazole 20 MG capsule Commonly known as: PRILOSEC Take 1 capsule (20 mg total) by mouth daily.   predniSONE 1 MG tablet Commonly known as: DELTASONE Take 1 mg by mouth daily with breakfast.   rosuvastatin 10 MG tablet Commonly known as: CRESTOR TAKE 1 TABLET BY MOUTH DAILY   silodosin 8 MG Caps capsule Commonly known as: RAPAFLO Take 1 capsule (8 mg total) by mouth at bedtime.   triamcinolone cream 0.1 % Commonly known as: KENALOG Apply 1 Application topically daily as needed  (itching).        Allergies:  Allergies  Allergen Reactions   Ciprofloxacin Itching   Mosquito (Diagnostic) Itching   Oxycodone-Acetaminophen Rash    Family History: Family History  Problem Relation Age of Onset   Heart Problems Father     Social History:  reports that he has never smoked. He has never used smokeless tobacco. He reports that he does not drink alcohol and does not use drugs.  ROS: All other review of systems were reviewed and are negative except what is noted above in HPI  Physical Exam: BP 103/70   Pulse 79   Constitutional:  Alert and oriented, No acute distress. HEENT:  AT, moist mucus membranes.  Trachea midline, no masses. Cardiovascular: No clubbing, cyanosis, or edema. Respiratory: Normal respiratory effort, no increased work of breathing. GI: Abdomen is soft, nontender, nondistended, no abdominal masses GU: No CVA tenderness.  Lymph: No cervical or inguinal lymphadenopathy. Skin: No rashes, bruises or suspicious lesions. Neurologic: Grossly intact, no focal deficits, moving all 4 extremities. Psychiatric: Normal mood and affect.  Laboratory Data: Lab Results  Component Value Date   WBC 6.5 03/28/2022   HGB 12.1 (L) 03/28/2022   HCT 35.9 (L) 03/28/2022   MCV 92 03/28/2022   PLT 192 03/28/2022    Lab Results  Component Value Date   CREATININE 1.99 (H) 03/28/2022    No results found for: "PSA"  No results found for: "TESTOSTERONE"  Lab Results  Component Value Date   HGBA1C 6.4 (H) 03/28/2022    Urinalysis    Component Value Date/Time   COLORURINE yellow 04/16/2007 0000   APPEARANCEUR Clear 05/12/2021 1136   LABSPEC 1.020 04/16/2007 0000   PHURINE 5.5 04/16/2007 0000   GLUCOSEU Trace (A) 05/12/2021 1136   HGBUR negative 04/16/2007 0000   BILIRUBINUR Negative 05/12/2021 1136   KETONESUR negative 12/28/2020 1532   PROTEINUR Negative 05/12/2021 1136   UROBILINOGEN 0.2 12/28/2020 1532   UROBILINOGEN negative 04/16/2007  0000   NITRITE Negative 05/12/2021 1136   NITRITE negative 04/16/2007 0000   LEUKOCYTESUR Negative 05/12/2021 1136    Lab Results  Component Value Date   LABMICR 29.8 11/24/2021   WBCUA None seen 05/12/2021   LABEPIT None seen 05/12/2021   MUCUS Present 05/12/2021   BACTERIA None seen 05/12/2021    Pertinent Imaging:  No results found for this or any previous visit.  No results found for this or any previous visit.  No results found for this or any previous visit.  No results found for this or any previous visit.  Results for orders placed during the hospital encounter of 12/17/20  US Renal  Narrative CLINICAL DATA:  Recurrent UTI in a 76 year old male.  EXAM: RENAL / URINARY TRACT ULTRASOUND COMPLETE  COMPARISON:  More remote studies dating back to  2005.  FINDINGS: Right Kidney:  Renal measurements: 9.5 x 4.0 x 5.1 cm = volume: 100.1 mL. Limited exam due to body habitus, no hydronephrosis. Mild parenchymal thinning.  Left Kidney:  Renal measurements: 9.8 x 5.2 x 5.2 cm = volume: 138.8 mL. Limited exam due to overlying bowel gas and body habitus. No hydronephrosis. Mild parenchymal thinning. Small cyst arising from the lower pole measuring 1.2 x 1.0 x 1.0 cm.  Bladder:  Appears normal for degree of bladder distention.  Other:  Increased hepatic echogenicity.  IMPRESSION: Mild cortical thinning without signs of hydronephrosis. Limited exam due to body habitus and bowel gas.  Small LEFT lower pole cyst.  Hepatic steatosis.   Electronically Signed By: Zetta Bills M.D. On: 12/18/2020 14:12  No valid procedures specified. Results for orders placed during the hospital encounter of 03/24/21  CT HEMATURIA WORKUP  Narrative CLINICAL DATA:  Microhematuria  EXAM: CT ABDOMEN AND PELVIS WITHOUT AND WITH CONTRAST  TECHNIQUE: Multidetector CT imaging of the abdomen and pelvis was performed following the standard protocol before and following  the bolus administration of intravenous contrast.  CONTRAST:  165m OMNIPAQUE IOHEXOL 350 MG/ML SOLN  COMPARISON:  None.  FINDINGS: Lower chest: No acute abnormality. Bandlike scarring of the bilateral lung bases.  Hepatobiliary: No focal liver abnormality is seen. Status post cholecystectomy. No biliary dilatation.  Pancreas: Unremarkable. No pancreatic ductal dilatation or surrounding inflammatory changes.  Spleen: Normal in size without significant abnormality.  Adrenals/Urinary Tract: Adrenal glands are unremarkable. Occasional small bilateral renal cysts. Kidneys are otherwise normal, without renal calculi, solid lesion, or hydronephrosis. No evidence of urinary tract filling defect on delayed phase imaging. Thickening of the urinary bladder wall. Diverticulum of the anterior bladder dome (series 7, image 74, series 12, image 69).  Stomach/Bowel: Stomach is within normal limits. Appendix appears normal. No evidence of bowel wall thickening, distention, or inflammatory changes.  Vascular/Lymphatic: Aortic atherosclerosis. No enlarged abdominal or pelvic lymph nodes.  Reproductive: Prostatomegaly with median lobe hypertrophy.  Other: No abdominal wall hernia or abnormality. No abdominopelvic ascites.  Musculoskeletal: No acute or significant osseous findings.  IMPRESSION: 1. No evidence of urinary tract calculus, mass, or hydronephrosis. No evidence of urinary tract filling defect on delayed phase imaging. 2. Thickening of the urinary bladder wall, likely related to chronic outlet obstruction although infectious or inflammatory cystitis is a differential consideration in the setting of hematuria. 3. Prostatomegaly.  Aortic Atherosclerosis (ICD10-I70.0).   Electronically Signed By: ADelanna AhmadiM.D. On: 03/26/2021 10:13  No results found for this or any previous visit.   Assessment & Plan:    1. Acute cystitis with hematuria Urine for culture -bactrim  DS BID for 7 days - Urinalysis, Routine w reflex microscopic - BLADDER SCAN AMB NON-IMAGING  2. Benign prostatic hyperplasia, unspecified whether lower urinary tract symptoms present Increase rapalfo to '8mg'$  BID   3. Nocturia -finasteride '5mg'$  daily    No follow-ups on file.  PNicolette Bang MD  [facility]

## 2022-04-01 NOTE — Progress Notes (Signed)
post void residual=[id number]

## 2022-04-03 LAB — URINE CULTURE: Organism ID, Bacteria: NO GROWTH

## 2022-04-04 NOTE — Therapy (Signed)
OUTPATIENT PHYSICAL THERAPY THORACOLUMBAR EVALUATION   Patient Name: Jonathan [name] MRN: [id number] DOB:[birth year], 76 y.o., male Today's Date: 04/06/2022  END OF SESSION:  PT End of Session - 04/06/22 0944     Visit Number 1    Number of Visits 6    Date for PT Re-Evaluation 05/18/22    Authorization Type [employer] Medicare    Authorization Time Period no VL and no auth required    PT Start Time 0943    PT Stop Time 1025    PT Time Calculation (min) 42 min    Activity Tolerance Patient tolerated treatment well    Behavior During Therapy WFL for tasks assessed/performed             Past Medical History:  Diagnosis Date   Arthritis    ra, sees dr Sedonia Small   Coronary artery disease    saw dr zachery in [place] 20 yrs ago, released from cardiology   Diabetes mellitus without complication ([name])    type 2   GERD (gastroesophageal reflux disease)    Hypercholesteremia    Hypertension    Urethral stricture    Past Surgical History:  Procedure Laterality Date   BACK SURGERY     x 3 lower back   CARDIAC CATHETERIZATION  2000   small amt plaque relased from cardiology 20 yrs ago   CHOLECYSTECTOMY     COLONOSCOPY WITH PROPOFOL N/A 10/26/2021   Procedure: COLONOSCOPY WITH PROPOFOL;  Surgeon: Eloise Harman, DO;  [facility];  Service: Endoscopy;  Laterality: N/A;  To arrive at [place] N/A 04/17/2019   Procedure: CYSTOSCOPY WITH URETHRAL DILATATION, CYSTOGRAM;  Surgeon: Ceasar Mons, MD;  Location: [facility];  Service: Urology;  Laterality: N/A;   EYE SURGERY Bilateral    cataracts   IR RADIOLOGIST EVAL & MGMT  07/12/2021   POLYPECTOMY  10/26/2021   Procedure: POLYPECTOMY INTESTINAL;  Surgeon: Eloise Harman, DO;  [facility];  Service: Endoscopy;;   urethral stricture surgery  15 yrs ago   Patient Active Problem List   Diagnosis Date Noted   Alternating constipation and  diarrhea 03/16/2022   Hip arthritis 03/09/2022   Encounter for examination following treatment at hospital 03/09/2022   Iron deficiency anemia 01/13/2022   Other fatigue 01/13/2022   PAD (peripheral artery disease) ([place]) 11/26/2021   Encounter for general adult medical examination with abnormal findings 11/26/2021   Eczema 11/26/2021   Onychomycosis 11/26/2021   Diabetic polyneuropathy associated with type 2 diabetes mellitus ([place]) 10/04/2021   Acute pain of right shoulder 09/17/2021   Subclinical hypothyroidism 02/24/2021   Current chronic use of systemic steroids 02/24/2021   Microhematuria 02/03/2021   Internal hemorrhoids 12/22/2020   UTI (urinary tract infection) 10/26/2020   Type II diabetes mellitus with nephropathy (HCC)    Chronic kidney disease, stage III (moderate) ([place]) 07/16/2007   MONOCYTOSIS SYMPTOMATIC 04/23/2007   BPH (benign prostatic hyperplasia) 04/16/2007   Constipation 10/16/2006   Hyperlipidemia LDL goal <100 04/06/2006   Essential hypertension 04/06/2006   CARDIAC ARRHYTHMIA 04/06/2006   Allergic rhinitis 04/06/2006   GERD (gastroesophageal reflux disease) 04/06/2006   HIATAL HERNIA 04/06/2006   Rheumatoid arthritis ([place]) 04/06/2006   LOW BACK PAIN 04/06/2006    PCP: [name], MD  REFERRING PROVIDER: Mordecai Rasmussen, [facility]  REFERRING DIAG:  M16.10 (ICD-10-CM) - Hip arthritis  M54.50,G89.29 (ICD-10-CM) - Chronic low back pain,  unspecified back pain laterality, unspecified whether sciatica present    Rationale for Evaluation and Treatment: Rehabilitation  THERAPY DIAG:  Low back pain, unspecified back pain laterality, unspecified chronicity, unspecified whether sciatica present  Difficulty in walking, not elsewhere classified  Other abnormalities of gait and mobility  Other symptoms and signs involving the musculoskeletal system  ONSET DATE: chronic low back pain  SUBJECTIVE:                                                                                                                                                                                            SUBJECTIVE STATEMENT: History of chronic back pain but has worsened over the year or so.  Dr. Posey Pronto referred to Dr. Amedeo Kinsman who referred to physical therapy.  He reports he has fallen a couple times; has a hard time standing up for a long time because "my back starts hurting"; walking with a SPC since last fall.  States both circulation issues and neuropathy in feet. Hammer toes bilaterally  PERTINENT HISTORY:  3 back surgeries; latest 2002  PAIN:  Are you having pain? Yes: NPRS scale: 9/10 Pain location: feet and toes Pain description: stabbing, sharp in toes Aggravating factors: constant Relieving factors: unknown  PRECAUTIONS: Fall  WEIGHT BEARING RESTRICTIONS: No  FALLS:  Has patient fallen in last 6 months? Yes. Number of falls 2  LIVING ENVIRONMENT: Lives with: lives with their spouse Lives in: House/apartment Stairs: No Has following equipment at home: Single point cane, Environmental consultant - 4 wheeled, and Grab bars  OCCUPATION: Retired Administrator  PLOF: Independent  PATIENT GOALS: get my balance better and get my back stronger  NEXT MD VISIT:   OBJECTIVE:   DIAGNOSTIC FINDINGS:  AP pelvis was obtained in clinic today.  These were compared to prior x-rays.  No acute injuries are noted.  Mild degenerative changes noted in both hips.  Mild osteophytes are appreciated in the lateral aspect of the acetabulum.  No evidence of a healing fracture within the pubic rami.   Impression: AP pelvis with mild hip joint degenerative changes bilaterally.  CLINICAL DATA:  Low back pain.  Left leg pain and numbness.   EXAM: MRI LUMBAR SPINE WITHOUT CONTRAST   TECHNIQUE: Multiplanar, multisequence MR imaging of the lumbar spine was performed. No intravenous contrast was administered.   COMPARISON:  Lumbar radiographs 04/13/2020    FINDINGS: Segmentation:  Normal   Alignment: Slight anterolisthesis L3-4. Otherwise normal alignment.   Vertebrae:  Normal bone marrow.  Negative for fracture or mass.   Conus medullaris and cauda equina: Conus extends to the L1-2 level. Conus and cauda equina appear  normal.   Paraspinal and other soft tissues: Negative for paraspinous mass or adenopathy.   Disc levels:   L1-2: Mild disc  degeneration.  Negative for stenosis   L2-3: Mild disc bulging.  Negative for stenosis   L3-4: Mild anterolisthesis with mild to moderate facet degeneration and diffuse disc bulging. Mild subarticular stenosis bilaterally.   L4-5: Mild disc bulging.  Negative for spinal or foraminal stenosis   L5-S1: Moderate disc degeneration with disc space narrowing and diffuse endplate spurring. Mild fatty endplate changes. Bilateral laminectomy. Moderate subarticular and foraminal stenosis bilaterally due to spurring.   IMPRESSION: Mild anterolisthesis L3-4 with bilateral facet degeneration and mild subarticular stenosis bilaterally   Laminectomy at L5-S1. Moderate disc degeneration and spurring. Moderate subarticular and foraminal stenosis bilaterally.     Electronically Signed   By: Franchot Gallo M.D.   On: 05/08/2020 08:07      PATIENT SURVEYS:  FOTO 46  COGNITION: Overall cognitive status: Within functional limits for tasks assessed     SENSATION: Burning and stabbinging feet and some numbness in hands  POSTURE: flexed trunk   PALPATION: Tender quadratus bilaterally and bilateral PSIS; iliac crest  LUMBAR ROM:   AROM eval  Flexion 70% available  Extension 0%  Right lateral flexion   Left lateral flexion   Right rotation   Left rotation    (Blank rows = not tested)    LOWER EXTREMITY MMT:    MMT Right eval Left eval  Hip flexion 4 4-  Hip extension 4- 3-  Hip abduction    Hip adduction    Hip internal rotation    Hip external rotation    Knee flexion 4 4-   Knee extension 4+ 4+  Ankle dorsiflexion 4* 4*  Ankle plantarflexion    Ankle inversion    Ankle eversion     (Blank rows = not tested)    FUNCTIONAL TESTS:  5 times sit to stand: 39.75 sec  GAIT: Distance walked: 50 ft Assistive device utilized: Single point cane Level of assistance: Modified independence Comments: decreased gait speed; forward flexed trunk  TODAY'S TREATMENT:                                                                                                                              DATE: 04/06/22 physical therapy evaluation and treatment    PATIENT EDUCATION:  Education details: Patient educated on exam findings, POC, scope of PT, HEP. Person educated: Patient Education method: Explanation, Demonstration, and Handouts Education comprehension: verbalized understanding, returned demonstration, verbal cues required, and tactile cues required   HOME EXERCISE PROGRAM: Access Code: [facility] URL: [email or web address] Date: 04/06/2022 Prepared by: [facility]  Exercises - Static Prone on Elbows  - 2 x daily - 7 x weekly - 1 sets - 1 reps - 2 minutes hold - Prone Gluteal Sets  - 2 x daily - 7 x weekly - 1 sets - 10 reps - 5 sec hold -  Prone Knee Flexion  - 2 x daily - 7 x weekly - 1 sets - 10 reps - Supine Bridge  - 2 x daily - 7 x weekly - 1 sets - 10 reps  ASSESSMENT:  CLINICAL IMPRESSION: Patient is a 76 y.o. male who was seen today for physical therapy evaluation and treatment for  M16.10 (ICD-10-CM) - Hip arthritis  M54.50,G89.29 (ICD-10-CM) - Chronic low back pain, unspecified back pain laterality, unspecified whether sciatica present Patient demonstrates muscle weakness, reduced ROM, and fascial restrictions which are likely contributing to symptoms of pain and are negatively impacting patient ability to perform ADLs and functional mobility tasks. Patient will benefit from skilled physical therapy services to address these deficits to  reduce pain and improve level of function with ADLs and functional mobility tasks.   .   OBJECTIVE IMPAIRMENTS: Abnormal gait, decreased activity tolerance, decreased balance, decreased coordination, decreased endurance, decreased knowledge of condition, decreased mobility, difficulty walking, decreased ROM, decreased strength, hypomobility, increased fascial restrictions, impaired perceived functional ability, impaired flexibility, postural dysfunction, and pain.   ACTIVITY LIMITATIONS: carrying, lifting, bending, sitting, standing, squatting, sleeping, locomotion level, and caring for others  PARTICIPATION LIMITATIONS: meal prep, cleaning, laundry, driving, shopping, community activity, and yard work  PERSONAL FACTORS: 1-2 comorbidities: DM, HBP  are also affecting patient's functional outcome.   REHAB POTENTIAL: Good  CLINICAL DECISION MAKING: Stable/uncomplicated  EVALUATION COMPLEXITY: Low   GOALS: Goals reviewed with patient? No  SHORT TERM GOALS: Target date: 04/27/2022  Patient will be independent with initial HEP  Baseline: Goal status: INITIAL  2.  Patient will improve 5 times sit to stand score from 39.75 sec to 35 sec to demonstrate improved functional mobility and increased lower extremity strength.  Baseline:  Goal status: INITIAL    LONG TERM GOALS: Target date: 05/18/2021  Patient will be independent in self management strategies to improve quality of life and functional outcomes.   Baseline:  Goal status: INITIAL  2.  Patient will improve 5 times sit to stand score from 39.75 sec to 29 sec to demonstrate improved functional mobility and increased lower extremity strength.  Baseline:  Goal status: INITIAL  3.   Patient will report at least 50% improvement in overall symptoms and/or function to demonstrate improved functional mobility  Baseline:  Goal status: INITIAL  4.  Patient will increase left leg MMTs to 4+ to 5/5 without pain to promote return  to ambulation community distances with minimal deviation.  Baseline: see above Goal status: INITIAL  5.  Patient will improve FOTO score to predicted value  Baseline: 46 Goal status: INITIAL   PLAN:  PT FREQUENCY: 1x/week  PT DURATION: 6 weeks  PLANNED INTERVENTIONS: Therapeutic exercises, Therapeutic activity, Neuromuscular re-education, Balance training, Gait training, Patient/Family education, Joint manipulation, Joint mobilization, Stair training, Orthotic/Fit training, DME instructions, Aquatic Therapy, Dry Needling, Electrical stimulation, Spinal manipulation, Spinal mobilization, Cryotherapy, Moist heat, Compression bandaging, scar mobilization, Splintting, Taping, Traction, Ultrasound, Ionotophoresis '4mg'$ /ml Dexamethasone, and Manual therapy .  PLAN FOR NEXT SESSION: Review HEP and goals;    11:17 AM, 04/06/22 Maryanne Huneycutt Small Orien Mayhall MPT [place] [facility] [place] [phone]

## 2022-04-06 ENCOUNTER — Ambulatory Visit (HOSPITAL_COMMUNITY): Payer: Medicare Other | Attending: Orthopedic Surgery

## 2022-04-06 DIAGNOSIS — I739 Peripheral vascular disease, unspecified: Secondary | ICD-10-CM | POA: Diagnosis present

## 2022-04-06 DIAGNOSIS — R262 Difficulty in walking, not elsewhere classified: Secondary | ICD-10-CM | POA: Insufficient documentation

## 2022-04-06 DIAGNOSIS — I10 Essential (primary) hypertension: Secondary | ICD-10-CM | POA: Diagnosis present

## 2022-04-06 DIAGNOSIS — R600 Localized edema: Secondary | ICD-10-CM | POA: Diagnosis present

## 2022-04-06 DIAGNOSIS — R29898 Other symptoms and signs involving the musculoskeletal system: Secondary | ICD-10-CM | POA: Diagnosis present

## 2022-04-06 DIAGNOSIS — G8929 Other chronic pain: Secondary | ICD-10-CM | POA: Diagnosis not present

## 2022-04-06 DIAGNOSIS — M79672 Pain in left foot: Secondary | ICD-10-CM | POA: Insufficient documentation

## 2022-04-06 DIAGNOSIS — M1610 Unilateral primary osteoarthritis, unspecified hip: Secondary | ICD-10-CM | POA: Diagnosis not present

## 2022-04-06 DIAGNOSIS — E782 Mixed hyperlipidemia: Secondary | ICD-10-CM | POA: Insufficient documentation

## 2022-04-06 DIAGNOSIS — M545 Low back pain, unspecified: Secondary | ICD-10-CM | POA: Insufficient documentation

## 2022-04-06 DIAGNOSIS — R2689 Other abnormalities of gait and mobility: Secondary | ICD-10-CM | POA: Diagnosis present

## 2022-04-07 ENCOUNTER — Telehealth: Payer: Self-pay

## 2022-04-07 NOTE — Telephone Encounter (Signed)
Received a fax from pharmacy that pt shows an allergy to Bactrim and the rx interferes with methotrexate.  I spoke to patient to confirm rx allergy, patient states he is taking the rx with no issues at this time and has about 3 days left until he completes treatment.

## 2022-04-11 ENCOUNTER — Encounter: Payer: Self-pay | Admitting: Internal Medicine

## 2022-04-11 ENCOUNTER — Encounter (HOSPITAL_COMMUNITY): Payer: Medicare Other

## 2022-04-11 ENCOUNTER — Ambulatory Visit (INDEPENDENT_AMBULATORY_CARE_PROVIDER_SITE_OTHER): Payer: Medicare Other | Admitting: Internal Medicine

## 2022-04-11 DIAGNOSIS — M069 Rheumatoid arthritis, unspecified: Secondary | ICD-10-CM

## 2022-04-11 DIAGNOSIS — M7989 Other specified soft tissue disorders: Secondary | ICD-10-CM | POA: Diagnosis not present

## 2022-04-11 MED ORDER — PREDNISONE 20 MG PO TABS: 20.0000 mg | ORAL_TABLET | Freq: Every day | ORAL | 0 refills | Status: DC

## 2022-04-11 NOTE — Progress Notes (Addendum)
Virtual Visit via Telephone Note   This visit type was conducted via telephone. This format is felt to be most appropriate for this patient at this time.  The patient did not have access to video technology/had technical difficulties with video requiring transitioning to audio format only (telephone).  All issues noted in this document were discussed and addressed.  No physical exam could be performed with this format.  Evaluation Performed:  Follow-up visit  Date:  04/11/2022   ID:  Jonathan [name], DOB [birth year], MRN [id number]  Patient Location: Home Provider Location: Office/Clinic  Participants: Patient Location of Patient: Home Location of Provider: Telehealth Consent was obtain for visit to be over via telehealth. I verified that I am speaking with the correct person using two identifiers.  PCP:  [name], MD   Chief Complaint: Left hand swelling  History of Present Illness:    Jonathan [name] is a 76 y.o. male who has a televisit for complaint of left hand pain and swelling for the last 3 days.  He denies any recent injury.  He has history of RA and takes methotrexate and prednisone (1 mg).  Denies any fever currently.  Denies any numbness or tingling of the hands.  The patient does not have symptoms concerning for COVID-19 infection (fever, chills, cough, or new shortness of breath).   Past Medical, Surgical, Social History, Allergies, and Medications have been Reviewed.  Past Medical History:  Diagnosis Date   Arthritis    ra, sees dr Sedonia Small   Coronary artery disease    saw dr zachery in [place] 20 yrs ago, released from cardiology   Diabetes mellitus without complication ([place])    type 2   GERD (gastroesophageal reflux disease)    Hypercholesteremia    Hypertension    Urethral stricture    Past Surgical History:  Procedure Laterality Date   BACK SURGERY     x 3 lower back   CARDIAC CATHETERIZATION  2000   small amt plaque relased from  cardiology 20 yrs ago   CHOLECYSTECTOMY     COLONOSCOPY WITH PROPOFOL N/A 10/26/2021   Procedure: COLONOSCOPY WITH PROPOFOL;  Surgeon: Eloise Harman, DO;  [facility];  Service: Endoscopy;  Laterality: N/A;  To arrive at [place] N/A 04/17/2019   Procedure: CYSTOSCOPY WITH URETHRAL DILATATION, CYSTOGRAM;  Surgeon: Ceasar Mons, MD;  Location: [facility];  Service: Urology;  Laterality: N/A;   EYE SURGERY Bilateral    cataracts   IR RADIOLOGIST EVAL & MGMT  07/12/2021   POLYPECTOMY  10/26/2021   Procedure: POLYPECTOMY INTESTINAL;  Surgeon: Eloise Harman, DO;  [facility];  Service: Endoscopy;;   urethral stricture surgery  15 yrs ago     Current Meds  Medication Sig   predniSONE (DELTASONE) 20 MG tablet Take 1 tablet (20 mg total) by mouth daily with breakfast.     Allergies:   Ciprofloxacin, Mosquito (diagnostic), and Oxycodone-acetaminophen   ROS:   Please see the history of present illness.     All other systems reviewed and are negative.   Labs/Other Tests and Data Reviewed:    Recent Labs: 11/24/2021: ALT 29; TSH 3.070 03/28/2022: BUN 37; Creatinine, Ser 1.99; Hemoglobin 12.1; Platelets 192; Potassium 4.0; Sodium 138   Recent Lipid Panel Lab Results  Component Value Date/Time   CHOL 135 11/24/2021 04:06 PM   TRIG 152 (H) 11/24/2021 04:06 PM  HDL 40 11/24/2021 04:06 PM   CHOLHDL 3.4 11/24/2021 04:06 PM   CHOLHDL 2.9 Ratio 07/16/2007 10:28 PM   LDLCALC 69 11/24/2021 04:06 PM    Wt Readings from Last 3 Encounters:  03/28/22 220 lb 3.2 oz (99.9 kg)  03/16/22 223 lb 6.4 oz (101.3 kg)  03/15/22 223 lb (101.2 kg)     ASSESSMENT & PLAN:    Left hand swelling Rheumatoid arthritis Due to inability to perform physical exam, have to rely on history His left hand swelling could be due to flareup of RA Started prednisone 20 mg daily X 5 days If persistent, will need office visit  for further evaluation  Time:   Today, I have spent 9 minutes reviewing the chart, including problem list, medications, and with the patient with telehealth technology discussing the above problems.   Medication Adjustments/Labs and Tests Ordered: Current medicines are reviewed at length with the patient today.  Concerns regarding medicines are outlined above.   Tests Ordered: No orders of the defined types were placed in this encounter.   Medication Changes: Meds ordered this encounter  Medications   predniSONE (DELTASONE) 20 MG tablet    Sig: Take 1 tablet (20 mg total) by mouth daily with breakfast.    Dispense:  5 tablet    Refill:  0     Note: This dictation was prepared with Dragon dictation along with smaller phrase technology. Similar sounding words can be transcribed inadequately or may not be corrected upon review. Any transcriptional errors that result from this process are unintentional.      Disposition:  Follow up  Signed, [name], MD  04/11/2022 4:41 PM     [facility]

## 2022-04-11 NOTE — Patient Instructions (Signed)
Please take Prednisone as prescribed.  Please keep hand elevated for swelling.  Okay to apply ice.  Please contact us if swelling does not improve in 1 week.

## 2022-04-12 ENCOUNTER — Telehealth: Payer: Medicare Other | Admitting: Internal Medicine

## 2022-04-12 ENCOUNTER — Encounter: Payer: Self-pay | Admitting: Urology

## 2022-04-12 NOTE — Patient Instructions (Signed)
Urinary Tract Infection, Adult  A urinary tract infection (UTI) is an infection of any part of the urinary tract. The urinary tract includes the kidneys, ureters, bladder, and urethra. These organs make, store, and get rid of urine in the body. An upper UTI affects the ureters and kidneys. A lower UTI affects the bladder and urethra. What are the causes? Most urinary tract infections are caused by bacteria in your genital area around your urethra, where urine leaves your body. These bacteria grow and cause inflammation of your urinary tract. What increases the risk? You are more likely to develop this condition if: You have a urinary catheter that stays in place. You are not able to control when you urinate or have a bowel movement (incontinence). You are male and you: Use a spermicide or diaphragm for birth control. Have low estrogen levels. Are pregnant. You have certain genes that increase your risk. You are sexually active. You take antibiotic medicines. You have a condition that causes your flow of urine to slow down, such as: An enlarged prostate, if you are male. Blockage in your urethra. A kidney stone. A nerve condition that affects your bladder control (neurogenic bladder). Not getting enough to drink, or not urinating often. You have certain medical conditions, such as: Diabetes. A weak disease-fighting system (immunesystem). Sickle cell disease. Gout. Spinal cord injury. What are the signs or symptoms? Symptoms of this condition include: Needing to urinate right away (urgency). Frequent urination. This may include small amounts of urine each time you urinate. Pain or burning with urination. Blood in the urine. Urine that smells bad or unusual. Trouble urinating. Cloudy urine. Vaginal discharge, if you are male. Pain in the abdomen or the lower back. You may also have: Vomiting or a decreased appetite. Confusion. Irritability or tiredness. A fever or  chills. Diarrhea. The first symptom in older adults may be confusion. In some cases, they may not have any symptoms until the infection has worsened. How is this diagnosed? This condition is diagnosed based on your medical history and a physical exam. You may also have other tests, including: Urine tests. Blood tests. Tests for STIs (sexually transmitted infections). If you have had more than one UTI, a cystoscopy or imaging studies may be done to determine the cause of the infections. How is this treated? Treatment for this condition includes: Antibiotic medicine. Over-the-counter medicines to treat discomfort. Drinking enough water to stay hydrated. If you have frequent infections or have other conditions such as a kidney stone, you may need to see a health care provider who specializes in the urinary tract (urologist). In rare cases, urinary tract infections can cause sepsis. Sepsis is a life-threatening condition that occurs when the body responds to an infection. Sepsis is treated in the hospital with IV antibiotics, fluids, and other medicines. Follow these instructions at home:  Medicines Take over-the-counter and prescription medicines only as told by your health care provider. If you were prescribed an antibiotic medicine, take it as told by your health care provider. Do not stop using the antibiotic even if you start to feel better. General instructions Make sure you: Empty your bladder often and completely. Do not hold urine for long periods of time. Empty your bladder after sex. Wipe from front to back after urinating or having a bowel movement if you are male. Use each tissue only one time when you wipe. Drink enough fluid to keep your urine pale yellow. Keep all follow-up visits. This is important. Contact a health   care provider if: Your symptoms do not get better after 1-2 days. Your symptoms go away and then return. Get help right away if: You have severe pain in  your back or your lower abdomen. You have a fever or chills. You have nausea or vomiting. Summary A urinary tract infection (UTI) is an infection of any part of the urinary tract, which includes the kidneys, ureters, bladder, and urethra. Most urinary tract infections are caused by bacteria in your genital area. Treatment for this condition often includes antibiotic medicines. If you were prescribed an antibiotic medicine, take it as told by your health care provider. Do not stop using the antibiotic even if you start to feel better. Keep all follow-up visits. This is important. This information is not intended to replace advice given to you by your health care provider. Make sure you discuss any questions you have with your health care provider. Document Revised: 11/29/2019 Document Reviewed: 11/29/2019 Elsevier Patient Education  2023 [employer]  

## 2022-04-14 ENCOUNTER — Other Ambulatory Visit
Admission: RE | Admit: 2022-04-14 | Discharge: 2022-04-14 | Disposition: A | Discharge status: Home / Self Care | Payer: Medicare Other | Attending: Cardiovascular Disease | Admitting: Cardiovascular Disease

## 2022-04-14 ENCOUNTER — Encounter: Payer: Self-pay | Admitting: Cardiovascular Disease

## 2022-04-14 ENCOUNTER — Ambulatory Visit: Payer: Medicare Other | Attending: Cardiovascular Disease | Admitting: Cardiovascular Disease

## 2022-04-14 VITALS — BP 100/64 | HR 60 | Ht 69.0 in | Wt 216.0 lb

## 2022-04-14 DIAGNOSIS — I739 Peripheral vascular disease, unspecified: Secondary | ICD-10-CM

## 2022-04-14 DIAGNOSIS — I10 Essential (primary) hypertension: Secondary | ICD-10-CM | POA: Insufficient documentation

## 2022-04-14 DIAGNOSIS — E785 Hyperlipidemia, unspecified: Secondary | ICD-10-CM

## 2022-04-14 LAB — BASIC METABOLIC PANEL
Anion gap: 5 (ref 5–15)
BUN: 63 mg/dL — ABNORMAL HIGH (ref 8–23)
CO2: 22 mmol/L (ref 22–32)
Calcium: 8.8 mg/dL — ABNORMAL LOW (ref 8.9–10.3)
Chloride: 113 mmol/L — ABNORMAL HIGH (ref 98–111)
Creatinine, Ser: 2.18 mg/dL — ABNORMAL HIGH (ref 0.61–1.24)
GFR, Estimated: 31 mL/min — ABNORMAL LOW (ref >60)
Glucose, Bld: 91 mg/dL (ref 70–99)
Potassium: 4 mmol/L (ref 3.5–5.1)
Sodium: 140 mmol/L (ref 135–145)

## 2022-04-14 LAB — CBC
HCT: 37.5 % — ABNORMAL LOW (ref 39.0–52.0)
Hemoglobin: 12.2 g/dL — ABNORMAL LOW (ref 13.0–17.0)
MCH: 30.3 pg (ref 26.0–34.0)
MCHC: 32.5 g/dL (ref 30.0–36.0)
MCV: 93.1 fL (ref 80.0–100.0)
Platelets: 222 10*3/uL (ref 150–400)
RBC: 4.03 MIL/uL — ABNORMAL LOW (ref 4.22–5.81)
RDW: 15.2 % (ref 11.5–15.5)
WBC: 6.4 10*3/uL (ref 4.0–10.5)
nRBC: 0 % (ref 0.0–0.2)

## 2022-04-14 NOTE — H&P (View-Only) (Signed)
Cardiology Office Note   Date:  04/14/2022   ID:  JAHAD OLD, DOB [birth year], MRN [id number]  PCP:  [name], MD  Cardiologist:   Kathlyn Sacramento, MD   Chief Complaint  Patient presents with   Other    3 month f/u c/o bilateral feet pain/discoloration. Meds reviewed verbally with pt.      History of Present Illness: Jonathan [name] is a 76 y.o. male who is here today for follow-up visit regarding peripheral arterial disease.  He has past medical history of essential hypertension, diabetes mellitus, GERD, rheumatoid arthritis, chronic kidney disease and obesity.  He was seen by cardiology in the past in [place] and [place] but subsequently released.  He reports having cardiac catheterization many years ago and was told about mild nonobstructive disease.  He is followed for peripheral arterial disease mainly due to below the knee disease.  He underwent noninvasive vascular evaluation in February of this year which showed moderately reduced ABI bilaterally at 0.65.  Duplex showed possible occluded peroneal artery bilaterally but no other obstructive disease. He reports worsening bilateral feet discomfort especially at the second toe bilaterally.  He is having symptoms at rest at night.  In addition, his feet are very cold.  He had issues with balance recently and also has hard time walking due to feet discomfort.   Past Medical History:  Diagnosis Date   Arthritis    ra, sees dr Sedonia Small   Coronary artery disease    saw dr zachery in [place] 20 yrs ago, released from cardiology   Diabetes mellitus without complication ([place])    type 2   GERD (gastroesophageal reflux disease)    Hypercholesteremia    Hypertension    Urethral stricture     Past Surgical History:  Procedure Laterality Date   BACK SURGERY     x 3 lower back   CARDIAC CATHETERIZATION  2000   small amt plaque relased from cardiology 20 yrs ago   CHOLECYSTECTOMY     COLONOSCOPY WITH PROPOFOL  N/A 10/26/2021   Procedure: COLONOSCOPY WITH PROPOFOL;  Surgeon: Eloise Harman, DO;  [facility];  Service: Endoscopy;  Laterality: N/A;  To arrive at [place] N/A 04/17/2019   Procedure: CYSTOSCOPY WITH URETHRAL DILATATION, CYSTOGRAM;  Surgeon: Ceasar Mons, MD;  Location: [facility];  Service: Urology;  Laterality: N/A;   EYE SURGERY Bilateral    cataracts   IR RADIOLOGIST EVAL & MGMT  07/12/2021   POLYPECTOMY  10/26/2021   Procedure: POLYPECTOMY INTESTINAL;  Surgeon: Eloise Harman, DO;  [facility];  Service: Endoscopy;;   urethral stricture surgery  15 yrs ago     Current Outpatient Medications  Medication Sig Dispense Refill   amLODipine (NORVASC) 10 MG tablet TAKE 1 TABLET BY MOUTH DAILY 100 tablet 2   aspirin EC 81 MG tablet Take 81 mg by mouth daily. Swallow whole.     finasteride (PROSCAR) 5 MG tablet Take 1 tablet (5 mg total) by mouth daily. 176 tablet 2   folic acid (FOLVITE) 1 MG tablet Take 1 mg by mouth daily.     gabapentin (NEURONTIN) 300 MG capsule Take 1 capsule (300 mg total) by mouth at bedtime. 30 capsule 5   glipiZIDE (GLUCOTROL) 5 MG tablet Take 1 tablet (5 mg total) by mouth 2 (two) times daily before a meal. (Patient taking differently: Take 5 mg by mouth daily  at 6 (six) AM.) 30 tablet 0   hydrocortisone (ANUSOL-HC) 2.5 % rectal cream Place 1 Application rectally 2 (two) times daily. For 10 days. May repeat cycle if needed. 30 g 1   losartan-hydrochlorothiazide (HYZAAR) 100-25 MG tablet TAKE 1 TABLET BY MOUTH DAILY 100 tablet 2   Menthol, Topical Analgesic, (BLUE-EMU MAXIMUM STRENGTH EX) Apply 1 Application topically daily as needed (pain).     methotrexate (RHEUMATREX) 2.5 MG tablet Take 12.5 mg by mouth every Sunday. Caution:Chemotherapy. Protect from light. Take 5 tablets PO once weekly.     metoprolol tartrate (LOPRESSOR) 50 MG tablet TAKE 1 TABLET BY MOUTH TWICE  DAILY  200 tablet 2   omeprazole (PRILOSEC) 20 MG capsule Take 1 capsule (20 mg total) by mouth daily. 30 capsule 5   predniSONE (DELTASONE) 20 MG tablet Take 1 tablet (20 mg total) by mouth daily with breakfast. 5 tablet 0   rosuvastatin (CRESTOR) 10 MG tablet TAKE 1 TABLET BY MOUTH DAILY 90 tablet 3   silodosin (RAPAFLO) 8 MG CAPS capsule Take 1 capsule (8 mg total) by mouth in the morning and at bedtime. 200 capsule 2   triamcinolone cream (KENALOG) 0.1 % Apply 1 Application topically daily as needed (itching). 80 g 1   No current facility-administered medications for this visit.    Allergies:   Ciprofloxacin, Mosquito (diagnostic), and Oxycodone-acetaminophen    Social History:  The patient  reports that he has never smoked. He has never used smokeless tobacco. He reports that he does not drink alcohol and does not use drugs.   Family History:  The patient's family history includes Heart Problems in his father.    ROS:  Please see the history of present illness.   Otherwise, review of systems are positive for none.   All other systems are reviewed and negative.    PHYSICAL EXAM: VS:  BP 100/64 (BP Location: Left Arm, Patient Position: Sitting, Cuff Size: Normal)   Pulse 60   Ht '5\' 9"'$  (1.753 m)   Wt 216 lb (98 kg)   SpO2 96%   BMI 31.90 kg/m  , BMI Body mass index is 31.9 kg/m. GEN: Well nourished, well developed, in no acute distress  HEENT: normal  Neck: no JVD, carotid bruits, or masses Cardiac: RRR; no murmurs, rubs, or gallops,no edema  Respiratory:  clear to auscultation bilaterally, normal work of breathing GI: soft, nontender, nondistended, + BS MS: no deformity or atrophy  Skin: warm and dry, no rash Neuro:  Strength and sensation are intact Psych: euthymic mood, full affect Vascular: Radial pulses normal bilaterally.  Femoral pulses normal bilaterally.  Distal pulses are not palpable on both sides.  Both feet are cold.  He has small callus on both tips of second toes  with significant tenderness.   EKG:  EKG is not ordered today.    Recent Labs: 11/24/2021: ALT 29; TSH 3.070 03/28/2022: BUN 37; Creatinine, Ser 1.99; Hemoglobin 12.1; Platelets 192; Potassium 4.0; Sodium 138    Lipid Panel    Component Value Date/Time   CHOL 135 11/24/2021 1606   TRIG 152 (H) 11/24/2021 1606   HDL 40 11/24/2021 1606   CHOLHDL 3.4 11/24/2021 1606   CHOLHDL 2.9 Ratio 07/16/2007 2228   VLDL 20 07/16/2007 2228   LDLCALC 69 11/24/2021 1606      Wt Readings from Last 3 Encounters:  04/14/22 216 lb (98 kg)  03/28/22 220 lb 3.2 oz (99.9 kg)  03/16/22 223 lb 6.4 oz (101.3 kg)  07/08/2021   10:12 AM  PAD Screen  Previous PAD dx? No  Previous surgical procedure? No  Pain with walking? No  Feet/toe relief with dangling? No  Painful, non-healing ulcers? No  Extremities discolored? Yes      ASSESSMENT AND PLAN:  1.  Peripheral arterial disease with rest pain: The patient has evidence of peripheral arterial disease mainly affecting his below the knee vessels.  His femoral and popliteal pulses are normal but distal pulses are not palpable.  Unfortunately, his symptoms progressed to rest pain and he has been having significant discomfort.  He is at high risk for limb loss.  I recommend proceeding with abdominal aortogram with lower extremity angiography and possible endovascular intervention.  I discussed the procedure in details as well as risks and benefits.  He does have underlying chronic kidney disease with the most recent creatinine of 1.99.  I discussed the risk of contrast-induced nephropathy and we will plan on bringing him early for hydration pre and postprocedure.  Will also plan on using CO2. Planned access is via the right common femoral artery.   2.  Hyperlipidemia: Continue treatment with rosuvastatin.  I reviewed most recent lipid profile which showed an LDL of 69.  3.  Essential hypertension: Blood pressures well controlled on current  medications.    Disposition:   Proceed with an angiogram next week and follow-up in 1 month.  Signed,  Kathlyn Sacramento, MD  04/14/2022 9:24 AM    [place]

## 2022-04-14 NOTE — Patient Instructions (Signed)
Medication Instructions:  No changes *If you need a refill on your cardiac medications before your next appointment, please call your pharmacy*   Lab Work: Your provider would like for you to have following labs drawn: BMET and CBC.   Please go to the [facility] entrance and check in at the front desk.  You do not need an appointment.  They are open from 7am-6 pm.   If you have labs (blood work) drawn today and your tests are completely normal, you will receive your results only by: [place] (if you have MyChart) OR A paper copy in the mail If you have any lab test that is abnormal or we need to change your treatment, we will call you to review the results.   Testing/Procedures: Your physician has requested that you have a peripheral vascular angiogram. This exam is performed at the hospital. During this exam IV contrast is used to look at arterial blood flow. Please review the information sheet given for details.  Follow-Up: At [facility], you and your health needs are our priority.  As part of our continuing mission to provide you with exceptional heart care, we have created designated Provider Care Teams.  These Care Teams include your primary Cardiologist (physician) and Advanced Practice Providers (APPs -  Physician Assistants and Nurse Practitioners) who all work together to provide you with the care you need, when you need it.  We recommend signing up for the patient portal called "MyChart".  Sign up information is provided on this After Visit Summary.  MyChart is used to connect with patients for Virtual Visits (Telemedicine).  Patients are able to view lab/test results, encounter notes, upcoming appointments, etc.  Non-urgent messages can be sent to your provider as well.   To learn more about what you can do with MyChart, go to [email or web address].    Your next appointment:   1 month(s)  The format for your next appointment:   In  Person  Provider:   You may see Dr. Fletcher Anon  Other Instructions       Cardiac/Peripheral Catheterization   You are scheduled for a Peripheral Angiogram on Wednesday, December 20 with Dr. Kathlyn Sacramento.  1. Please arrive at the Main Entrance A at [facility] [place], [place] [address] on December 20 at 6:30 AM (This time is four hours before your procedure to ensure your preparation). Free valet parking service is available. You will check in at ADMITTING. The support person will be asked to wait in the waiting room.  It is OK to have someone drop you off and come back when you are ready to be discharged.        Special note: Every effort is made to have your procedure done on time. Please understand that emergencies sometimes delay scheduled procedures.   . 2. Diet: Do not eat solid foods after midnight.  You may have clear liquids until 5 AM the day of the procedure.  3. Labs: You will need to have blood drawn on  04/14/22. You do not need to be fasting.  4. Medication instructions in preparation for your procedure: Hold all the diabetic medication the morning of the procedure Hold the Losartan-Hydrochlorothiazide the morning of the procedure   On the morning of your procedure, take Aspirin 81 mg and any morning medicines NOT listed above.  You may use sips of water.  5. Plan to go home the same day, you will only stay overnight  if medically necessary. 6. You MUST have a responsible adult to drive you home. 7. An adult MUST be with you the first 24 hours after you arrive home. 8. Bring a current list of your medications, and the last time and date medication taken. 9. Bring ID and current insurance cards. 10.Please wear clothes that are easy to get on and off and wear slip-on shoes.  Thank you for allowing [place] to care for you!   -- [place] [facility]

## 2022-04-14 NOTE — Progress Notes (Signed)
Cardiology Office Note   Date:  04/14/2022   ID:  Jonathan [name], DOB April 18, 1946, MRN [id number]  PCP:  [name], MD  Cardiologist:   Kathlyn Sacramento, MD   Chief Complaint  Patient presents with   Other    3 month f/u c/o bilateral feet pain/discoloration. Meds reviewed verbally with pt.      History of Present Illness: Jonathan [name] is a 76 y.o. male who is here today for follow-up visit regarding peripheral arterial disease.  He has past medical history of essential hypertension, diabetes mellitus, GERD, rheumatoid arthritis, chronic kidney disease and obesity.  He was seen by cardiology in the past in [place] and [place] but subsequently released.  He reports having cardiac catheterization many years ago and was told about mild nonobstructive disease.  He is followed for peripheral arterial disease mainly due to below the knee disease.  He underwent noninvasive vascular evaluation in February of this year which showed moderately reduced ABI bilaterally at 0.65.  Duplex showed possible occluded peroneal artery bilaterally but no other obstructive disease. He reports worsening bilateral feet discomfort especially at the second toe bilaterally.  He is having symptoms at rest at night.  In addition, his feet are very cold.  He had issues with balance recently and also has hard time walking due to feet discomfort.   Past Medical History:  Diagnosis Date   Arthritis    ra, sees dr Sedonia Small   Coronary artery disease    saw dr zachery in [place] 20 yrs ago, released from cardiology   Diabetes mellitus without complication ([place])    type 2   GERD (gastroesophageal reflux disease)    Hypercholesteremia    Hypertension    Urethral stricture     Past Surgical History:  Procedure Laterality Date   BACK SURGERY     x 3 lower back   CARDIAC CATHETERIZATION  2000   small amt plaque relased from cardiology 20 yrs ago   CHOLECYSTECTOMY     COLONOSCOPY WITH PROPOFOL  N/A 10/26/2021   Procedure: COLONOSCOPY WITH PROPOFOL;  Surgeon: Eloise Harman, DO;  [facility];  Service: Endoscopy;  Laterality: N/A;  To arrive at [place] N/A 04/17/2019   Procedure: CYSTOSCOPY WITH URETHRAL DILATATION, CYSTOGRAM;  Surgeon: Ceasar Mons, MD;  Location: [facility];  Service: Urology;  Laterality: N/A;   EYE SURGERY Bilateral    cataracts   IR RADIOLOGIST EVAL & MGMT  07/12/2021   POLYPECTOMY  10/26/2021   Procedure: POLYPECTOMY INTESTINAL;  Surgeon: Eloise Harman, DO;  [facility];  Service: Endoscopy;;   urethral stricture surgery  15 yrs ago     Current Outpatient Medications  Medication Sig Dispense Refill   amLODipine (NORVASC) 10 MG tablet TAKE 1 TABLET BY MOUTH DAILY 100 tablet 2   aspirin EC 81 MG tablet Take 81 mg by mouth daily. Swallow whole.     finasteride (PROSCAR) 5 MG tablet Take 1 tablet (5 mg total) by mouth daily. 431 tablet 2   folic acid (FOLVITE) 1 MG tablet Take 1 mg by mouth daily.     gabapentin (NEURONTIN) 300 MG capsule Take 1 capsule (300 mg total) by mouth at bedtime. 30 capsule 5   glipiZIDE (GLUCOTROL) 5 MG tablet Take 1 tablet (5 mg total) by mouth 2 (two) times daily before a meal. (Patient taking differently: Take 5 mg by mouth daily  at 6 (six) AM.) 30 tablet 0   hydrocortisone (ANUSOL-HC) 2.5 % rectal cream Place 1 Application rectally 2 (two) times daily. For 10 days. May repeat cycle if needed. 30 g 1   losartan-hydrochlorothiazide (HYZAAR) 100-25 MG tablet TAKE 1 TABLET BY MOUTH DAILY 100 tablet 2   Menthol, Topical Analgesic, (BLUE-EMU MAXIMUM STRENGTH EX) Apply 1 Application topically daily as needed (pain).     methotrexate (RHEUMATREX) 2.5 MG tablet Take 12.5 mg by mouth every Sunday. Caution:Chemotherapy. Protect from light. Take 5 tablets PO once weekly.     metoprolol tartrate (LOPRESSOR) 50 MG tablet TAKE 1 TABLET BY MOUTH TWICE  DAILY  200 tablet 2   omeprazole (PRILOSEC) 20 MG capsule Take 1 capsule (20 mg total) by mouth daily. 30 capsule 5   predniSONE (DELTASONE) 20 MG tablet Take 1 tablet (20 mg total) by mouth daily with breakfast. 5 tablet 0   rosuvastatin (CRESTOR) 10 MG tablet TAKE 1 TABLET BY MOUTH DAILY 90 tablet 3   silodosin (RAPAFLO) 8 MG CAPS capsule Take 1 capsule (8 mg total) by mouth in the morning and at bedtime. 200 capsule 2   triamcinolone cream (KENALOG) 0.1 % Apply 1 Application topically daily as needed (itching). 80 g 1   No current facility-administered medications for this visit.    Allergies:   Ciprofloxacin, Mosquito (diagnostic), and Oxycodone-acetaminophen    Social History:  The patient  reports that he has never smoked. He has never used smokeless tobacco. He reports that he does not drink alcohol and does not use drugs.   Family History:  The patient's family history includes Heart Problems in his father.    ROS:  Please see the history of present illness.   Otherwise, review of systems are positive for none.   All other systems are reviewed and negative.    PHYSICAL EXAM: VS:  BP 100/64 (BP Location: Left Arm, Patient Position: Sitting, Cuff Size: Normal)   Pulse 60   Ht '5\' 9"'$  (1.753 m)   Wt 216 lb (98 kg)   SpO2 96%   BMI 31.90 kg/m  , BMI Body mass index is 31.9 kg/m. GEN: Well nourished, well developed, in no acute distress  HEENT: normal  Neck: no JVD, carotid bruits, or masses Cardiac: RRR; no murmurs, rubs, or gallops,no edema  Respiratory:  clear to auscultation bilaterally, normal work of breathing GI: soft, nontender, nondistended, + BS MS: no deformity or atrophy  Skin: warm and dry, no rash Neuro:  Strength and sensation are intact Psych: euthymic mood, full affect Vascular: Radial pulses normal bilaterally.  Femoral pulses normal bilaterally.  Distal pulses are not palpable on both sides.  Both feet are cold.  He has small callus on both tips of second toes  with significant tenderness.   EKG:  EKG is not ordered today.    Recent Labs: 11/24/2021: ALT 29; TSH 3.070 03/28/2022: BUN 37; Creatinine, Ser 1.99; Hemoglobin 12.1; Platelets 192; Potassium 4.0; Sodium 138    Lipid Panel    Component Value Date/Time   CHOL 135 11/24/2021 1606   TRIG 152 (H) 11/24/2021 1606   HDL 40 11/24/2021 1606   CHOLHDL 3.4 11/24/2021 1606   CHOLHDL 2.9 Ratio 07/16/2007 2228   VLDL 20 07/16/2007 2228   LDLCALC 69 11/24/2021 1606      Wt Readings from Last 3 Encounters:  04/14/22 216 lb (98 kg)  03/28/22 220 lb 3.2 oz (99.9 kg)  03/16/22 223 lb 6.4 oz (101.3 kg)  07/08/2021   10:12 AM  PAD Screen  Previous PAD dx? No  Previous surgical procedure? No  Pain with walking? No  Feet/toe relief with dangling? No  Painful, non-healing ulcers? No  Extremities discolored? Yes      ASSESSMENT AND PLAN:  1.  Peripheral arterial disease with rest pain: The patient has evidence of peripheral arterial disease mainly affecting his below the knee vessels.  His femoral and popliteal pulses are normal but distal pulses are not palpable.  Unfortunately, his symptoms progressed to rest pain and he has been having significant discomfort.  He is at high risk for limb loss.  I recommend proceeding with abdominal aortogram with lower extremity angiography and possible endovascular intervention.  I discussed the procedure in details as well as risks and benefits.  He does have underlying chronic kidney disease with the most recent creatinine of 1.99.  I discussed the risk of contrast-induced nephropathy and we will plan on bringing him early for hydration pre and postprocedure.  Will also plan on using CO2. Planned access is via the right common femoral artery.   2.  Hyperlipidemia: Continue treatment with rosuvastatin.  I reviewed most recent lipid profile which showed an LDL of 69.  3.  Essential hypertension: Blood pressures well controlled on current  medications.    Disposition:   Proceed with an angiogram next week and follow-up in 1 month.  Signed,  Kathlyn Sacramento, MD  04/14/2022 9:24 AM    [place]

## 2022-04-18 ENCOUNTER — Telehealth: Payer: Self-pay | Admitting: *Deleted

## 2022-04-18 MED ORDER — LOSARTAN POTASSIUM 100 MG PO TABS: 100.0000 mg | ORAL_TABLET | Freq: Every day | ORAL | 0 refills | Status: DC

## 2022-04-18 NOTE — Telephone Encounter (Signed)
Abdominal aortogram scheduled at [facility] for: Wednesday April 20, 2022 10:30 AM Arrival time and place: [facility] Entrance A at: 6:30 AM  Nothing to eat after midnight prior to procedure, clear liquids until 5 AM day of procedure  Medication instructions: -Hold:  Glipizide-AM of procedure  Losartan/HCTZ-will discontinue per Dr Fletcher Anon (see 04/14/22 BMP results) - pt will start losartan 100 mg after procedure -per protocol GFR 31. -Except hold medications usual morning medications can be taken with sips of water including aspirin 81 mg.  Confirmed patient has responsible adult to drive home post procedure and be with patient first 24 hours after arriving home.  Patient reports no new symptoms concerning for COVID-19 in the past 10 days.  Reviewed procedure instructions/pre-procedure hydration with patient.

## 2022-04-18 NOTE — Addendum Note (Signed)
Addended by: Britt Bottom on: 04/18/2022 03:16 PM   Modules accepted: Orders

## 2022-04-18 NOTE — Telephone Encounter (Signed)
Copied from 04/14/22 BMP results per Dr Fletcher Anon:   "Inform patient that he is mildly dehydrated.  Recommend stopping losartan-hydrochlorothiazide and switch him to losartan 100 mg once daily.  He should increase fluid intake."  Patient aware to stop losartan-hydrochlorothiazide now (took dose this morning) and will plan to start losartan 100 mg once daily after abdominal aortogram 04/20/22 since GFR 31 and cath lab protocol is to hold day before (Tue) and day of procedure (Wed).

## 2022-04-20 ENCOUNTER — Ambulatory Visit (HOSPITAL_BASED_OUTPATIENT_CLINIC_OR_DEPARTMENT_OTHER): Payer: Medicare Other

## 2022-04-20 ENCOUNTER — Encounter (HOSPITAL_COMMUNITY): Payer: Medicare Other | Admitting: Physical Therapy

## 2022-04-20 ENCOUNTER — Ambulatory Visit (HOSPITAL_COMMUNITY)
Admission: RE | Admit: 2022-04-20 | Discharge: 2022-04-20 | Disposition: A | Discharge status: Home / Self Care | Payer: Medicare Other | Attending: Cardiovascular Disease | Admitting: Cardiovascular Disease

## 2022-04-20 ENCOUNTER — Telehealth (HOSPITAL_COMMUNITY): Payer: Self-pay | Admitting: Physical Therapy

## 2022-04-20 ENCOUNTER — Encounter (HOSPITAL_COMMUNITY)
Admission: RE | Disposition: A | Discharge status: Home / Self Care | Payer: Self-pay | Source: Home / Self Care | Attending: Cardiovascular Disease

## 2022-04-20 DIAGNOSIS — E669 Obesity, unspecified: Secondary | ICD-10-CM | POA: Diagnosis not present

## 2022-04-20 DIAGNOSIS — E1151 Type 2 diabetes mellitus with diabetic peripheral angiopathy without gangrene: Secondary | ICD-10-CM | POA: Insufficient documentation

## 2022-04-20 DIAGNOSIS — Z6831 Body mass index (BMI) 31.0-31.9, adult: Secondary | ICD-10-CM | POA: Diagnosis not present

## 2022-04-20 DIAGNOSIS — I70222 Atherosclerosis of native arteries of extremities with rest pain, left leg: Secondary | ICD-10-CM | POA: Diagnosis not present

## 2022-04-20 DIAGNOSIS — E7800 Pure hypercholesterolemia, unspecified: Secondary | ICD-10-CM | POA: Insufficient documentation

## 2022-04-20 DIAGNOSIS — I739 Peripheral vascular disease, unspecified: Secondary | ICD-10-CM

## 2022-04-20 DIAGNOSIS — N189 Chronic kidney disease, unspecified: Secondary | ICD-10-CM | POA: Diagnosis not present

## 2022-04-20 DIAGNOSIS — K219 Gastro-esophageal reflux disease without esophagitis: Secondary | ICD-10-CM | POA: Insufficient documentation

## 2022-04-20 DIAGNOSIS — Z7984 Long term (current) use of oral hypoglycemic drugs: Secondary | ICD-10-CM | POA: Diagnosis not present

## 2022-04-20 DIAGNOSIS — I129 Hypertensive chronic kidney disease with stage 1 through stage 4 chronic kidney disease, or unspecified chronic kidney disease: Secondary | ICD-10-CM | POA: Insufficient documentation

## 2022-04-20 DIAGNOSIS — Z79899 Other long term (current) drug therapy: Secondary | ICD-10-CM | POA: Diagnosis not present

## 2022-04-20 DIAGNOSIS — Z01810 Encounter for preprocedural cardiovascular examination: Secondary | ICD-10-CM | POA: Diagnosis not present

## 2022-04-20 DIAGNOSIS — E1122 Type 2 diabetes mellitus with diabetic chronic kidney disease: Secondary | ICD-10-CM | POA: Diagnosis not present

## 2022-04-20 HISTORY — PX: ABDOMINAL AORTOGRAM W/LOWER EXTREMITY: CATH118223

## 2022-04-20 HISTORY — PX: PERIPHERAL VASCULAR BALLOON ANGIOPLASTY: CATH118281

## 2022-04-20 LAB — POCT ACTIVATED CLOTTING TIME
Activated Clotting Time: 228 seconds
Activated Clotting Time: 239 seconds

## 2022-04-20 LAB — GLUCOSE, CAPILLARY
Glucose-Capillary: 106 mg/dL — ABNORMAL HIGH (ref 70–99)
Glucose-Capillary: 111 mg/dL — ABNORMAL HIGH (ref 70–99)

## 2022-04-20 SURGERY — ABDOMINAL AORTOGRAM W/LOWER EXTREMITY
Anesthesia: LOCAL

## 2022-04-20 MED ORDER — SODIUM CHLORIDE 0.9 % WEIGHT BASED INFUSION
3.0000 mL/kg/h | INTRAVENOUS | Status: AC
  Administered 2022-04-20: 3 mL/kg/h via INTRAVENOUS

## 2022-04-20 MED ORDER — SODIUM CHLORIDE 0.9 % WEIGHT BASED INFUSION: 1.0000 mL/kg/h | INTRAVENOUS | Status: DC

## 2022-04-20 MED ORDER — SODIUM CHLORIDE 0.9% FLUSH: 3.0000 mL | INTRAVENOUS | Status: DC | PRN

## 2022-04-20 MED ORDER — VERAPAMIL HCL 2.5 MG/ML IV SOLN
INTRAVENOUS | Status: DC | PRN
  Administered 2022-04-20: 3 mg via INTRA_ARTERIAL

## 2022-04-20 MED ORDER — IODIXANOL 320 MG/ML IV SOLN
INTRAVENOUS | Status: DC | PRN
  Administered 2022-04-20: 45 mL via INTRA_ARTERIAL

## 2022-04-20 MED ORDER — HEPARIN SODIUM (PORCINE) 1000 UNIT/ML IJ SOLN
INTRAMUSCULAR | Status: DC | PRN
  Administered 2022-04-20: 2000 [IU] via INTRAVENOUS
  Administered 2022-04-20: 2000 [IU] via INTRA_ARTERIAL
  Administered 2022-04-20: 8000 [IU] via INTRAVENOUS
  Administered 2022-04-20: 2000 [IU] via INTRAVENOUS

## 2022-04-20 MED ORDER — MIDAZOLAM HCL 2 MG/2ML IJ SOLN
INTRAMUSCULAR | Status: AC
  Filled 2022-04-20: qty 2

## 2022-04-20 MED ORDER — SODIUM CHLORIDE 0.9 % IV SOLN: INTRAVENOUS | Status: DC

## 2022-04-20 MED ORDER — SODIUM CHLORIDE 0.9% FLUSH: 3.0000 mL | Freq: Two times a day (BID) | INTRAVENOUS | Status: DC

## 2022-04-20 MED ORDER — FENTANYL CITRATE (PF) 100 MCG/2ML IJ SOLN
INTRAMUSCULAR | Status: AC
  Filled 2022-04-20: qty 2

## 2022-04-20 MED ORDER — HEPARIN SODIUM (PORCINE) 1000 UNIT/ML IJ SOLN
INTRAMUSCULAR | Status: AC
  Filled 2022-04-20: qty 10

## 2022-04-20 MED ORDER — HEPARIN (PORCINE) IN NACL 1000-0.9 UT/500ML-% IV SOLN
INTRAVENOUS | Status: DC | PRN
  Administered 2022-04-20 (×2): 500 mL

## 2022-04-20 MED ORDER — FENTANYL CITRATE (PF) 100 MCG/2ML IJ SOLN
INTRAMUSCULAR | Status: DC | PRN
  Administered 2022-04-20 (×2): 50 ug via INTRAVENOUS

## 2022-04-20 MED ORDER — LABETALOL HCL 5 MG/ML IV SOLN: 10.0000 mg | INTRAVENOUS | Status: DC | PRN

## 2022-04-20 MED ORDER — LIDOCAINE HCL (PF) 1 % IJ SOLN
INTRAMUSCULAR | Status: DC | PRN
  Administered 2022-04-20: 5 mL
  Administered 2022-04-20: 10 mL

## 2022-04-20 MED ORDER — SODIUM CHLORIDE 0.9 % IV SOLN: 250.0000 mL | INTRAVENOUS | Status: DC | PRN

## 2022-04-20 MED ORDER — ONDANSETRON HCL 4 MG/2ML IJ SOLN: 4.0000 mg | Freq: Four times a day (QID) | INTRAMUSCULAR | Status: DC | PRN

## 2022-04-20 MED ORDER — MIDAZOLAM HCL 2 MG/2ML IJ SOLN
INTRAMUSCULAR | Status: DC | PRN
  Administered 2022-04-20 (×2): 1 mg via INTRAVENOUS

## 2022-04-20 MED ORDER — ASPIRIN 81 MG PO CHEW
81.0000 mg | CHEWABLE_TABLET | ORAL | Status: AC
  Administered 2022-04-20: 81 mg via ORAL
  Filled 2022-04-20: qty 1

## 2022-04-20 MED ORDER — LIDOCAINE HCL (PF) 1 % IJ SOLN
INTRAMUSCULAR | Status: AC
  Filled 2022-04-20: qty 30

## 2022-04-20 MED ORDER — HEPARIN (PORCINE) IN NACL 1000-0.9 UT/500ML-% IV SOLN
INTRAVENOUS | Status: AC
  Filled 2022-04-20: qty 1000

## 2022-04-20 MED ORDER — ACETAMINOPHEN 325 MG PO TABS
650.0000 mg | ORAL_TABLET | ORAL | Status: DC | PRN
  Administered 2022-04-20: 650 mg via ORAL
  Filled 2022-04-20: qty 2

## 2022-04-20 SURGICAL SUPPLY — 29 items
BALLN COYOTE ES OTW 2X20X142 (BALLOONS) ×2
BALLOON COYOTE ES OTW 2X20X142 (BALLOONS) IMPLANT
BNDG HMST LF TOP THROMBIX (HEMOSTASIS) ×2
CATH ANGIO 5F PIGTAIL 65CM (CATHETERS) IMPLANT
CATH CROSS OVER TEMPO 5F (CATHETERS) IMPLANT
CATH CXI 2.3F 65 ST (CATHETERS) IMPLANT
CATH STRAIGHT 5FR 65CM (CATHETERS) IMPLANT
CATH SYNTRAX .014X150 (CATHETERS) IMPLANT
DEVICE CLOSURE MYNXGRIP 6/7F (Vascular Products) IMPLANT
KIT ANGIASSIST CO2 SYSTEM (KITS) IMPLANT
KIT ENCORE 26 ADVANTAGE (KITS) IMPLANT
KIT ESSENTIALS PG (KITS) IMPLANT
KIT MICROPUNCTURE NIT STIFF (SHEATH) IMPLANT
KIT PV (KITS) ×2 IMPLANT
PATCH THROMBIX TOPICAL PLAIN (HEMOSTASIS) IMPLANT
SHEATH CATAPULT 6FR 60 (SHEATH) IMPLANT
SHEATH GLIDE SLENDER 4/5FR (SHEATH) IMPLANT
SHEATH PINNACLE 5F 10CM (SHEATH) IMPLANT
SHEATH PINNACLE 6F 10CM (SHEATH) IMPLANT
SHEATH PROBE COVER 6X72 (BAG) IMPLANT
SYR MEDRAD MARK 7 150ML (SYRINGE) ×2 IMPLANT
TRANSDUCER W/STOPCOCK (MISCELLANEOUS) ×2 IMPLANT
TRAY PV CATH (CUSTOM PROCEDURE TRAY) ×2 IMPLANT
WIRE G V18X300CM (WIRE) IMPLANT
WIRE HITORQ VERSACORE ST 145CM (WIRE) IMPLANT
WIRE RUNTHROUGH IZANAI 014 180 (WIRE) IMPLANT
WIRE RUNTHROUGH IZANAI 014 300 (WIRE) IMPLANT
WIRE SHEPHERD 12G .014 (WIRE) IMPLANT
WIRE VERSACORE LOC 115CM (WIRE) IMPLANT

## 2022-04-20 NOTE — Interval H&P Note (Signed)
History and Physical Interval Note:  04/20/2022 11:30 AM  Jonathan [name]  has presented today for surgery, with the diagnosis of PAD.  The various methods of treatment have been discussed with the patient and family. After consideration of risks, benefits and other options for treatment, the patient has consented to  Procedure(s): ABDOMINAL AORTOGRAM W/LOWER EXTREMITY (N/A) as a surgical intervention.  The patient's history has been reviewed, patient examined, no change in status, stable for surgery.  I have reviewed the patient's chart and labs.  Questions were answered to the patient's satisfaction.     Kathlyn Sacramento

## 2022-04-20 NOTE — Progress Notes (Signed)
Lower extremity vein mapping study completed.   Please see CV Proc for preliminary results.   Darlin Coco, RDMS, RVT

## 2022-04-20 NOTE — Telephone Encounter (Signed)
Pt did not show for appt.  Unable to reach, however appears he is having a procedure done at [facility] this morning.    Teena Irani, PTA/CLT [place] Ph: [phone]

## 2022-04-21 ENCOUNTER — Encounter (HOSPITAL_COMMUNITY): Payer: Self-pay | Admitting: Cardiovascular Disease

## 2022-04-26 ENCOUNTER — Telehealth: Payer: Self-pay | Admitting: Cardiovascular Disease

## 2022-04-26 DIAGNOSIS — R600 Localized edema: Secondary | ICD-10-CM

## 2022-04-26 NOTE — Telephone Encounter (Signed)
Left message to call back  

## 2022-04-26 NOTE — Telephone Encounter (Signed)
New Message:      Patient said he had a procedure on Wednesday(04-20-22). He said his feet started swelling after the rocedure and is still swollen.   Pt c/o swelling: STAT is pt has developed SOB within 24 hours  If swelling, where is the swelling located? Feet up to a part of his leg  How much weight have you gained and in what time span?   Have you gained 3 pounds in a day or 5 pounds in a week?   Do you have a log of your daily weights (if so, list)?   Are you currently taking a fluid pill?  no  Are you currently SOB? no  Have you traveled recently? no

## 2022-04-26 NOTE — Telephone Encounter (Signed)
Pt called to report bilateral feet and ankle swelling that started Thursday evening and stated the left is worse than right. Pt stated symptoms will resolve a little with elevations but don't fully go down. Pt denies SOB, significant weight increase, or any other symptoms.     Will forward to MD to make aware.

## 2022-04-26 NOTE — Telephone Encounter (Signed)
Given underlying chronic kidney disease and recent contrast exposure, recommend checking basic metabolic profile to ensure his renal function is stable.  He is to continue with leg elevation but should not use any support stockings given his advanced peripheral arterial disease.

## 2022-04-26 NOTE — Telephone Encounter (Signed)
Pt made aware of MD's recommendations and verbalized understanding. BMP ordered placed

## 2022-04-27 ENCOUNTER — Other Ambulatory Visit (HOSPITAL_COMMUNITY)
Admission: RE | Admit: 2022-04-27 | Discharge: 2022-04-27 | Disposition: A | Discharge status: Home / Self Care | Payer: Medicare Other | Source: Ambulatory Visit | Attending: Cardiovascular Disease | Admitting: Cardiovascular Disease

## 2022-04-27 ENCOUNTER — Telehealth: Payer: Self-pay | Admitting: Cardiovascular Disease

## 2022-04-27 DIAGNOSIS — R600 Localized edema: Secondary | ICD-10-CM | POA: Insufficient documentation

## 2022-04-27 LAB — BASIC METABOLIC PANEL WITH GFR
Anion gap: 5 (ref 5–15)
BUN: 15 mg/dL (ref 8–23)
CO2: 24 mmol/L (ref 22–32)
Calcium: 8.4 mg/dL — ABNORMAL LOW (ref 8.9–10.3)
Chloride: 107 mmol/L (ref 98–111)
Creatinine, Ser: 1.37 mg/dL — ABNORMAL HIGH (ref 0.61–1.24)
GFR, Estimated: 54 mL/min — ABNORMAL LOW
Glucose, Bld: 70 mg/dL (ref 70–99)
Potassium: 3.8 mmol/L (ref 3.5–5.1)
Sodium: 136 mmol/L (ref 135–145)

## 2022-04-27 NOTE — Telephone Encounter (Signed)
The patient called back in with his concerns from this morning. He did his lab work today as advised. Results are in epic  The patient had an angio on 12/20. He stated that starting that Friday (12/22), he started having bilateral lower extremity edema. The swelling does not go down overnight. He does elevate when he can but does not feel like this helps. He stated that he does not feel like this is fluid because he cannot leave an indentation when he presses on it. He denies shortness of breath.   He denies discoloration and temperature change. He stated that it does hurt to ambulate but that the pain is on the top of his feet and feels like a shooting pain.   Appointment made for 12/28 for further assessment.

## 2022-04-27 NOTE — Telephone Encounter (Signed)
Pt c/o swelling: STAT is pt has developed SOB within 24 hours  If swelling, where is the swelling located? Legs, ankles, and feet   How much weight have you gained and in what time span? Unsure   Have you gained 3 pounds in a day or 5 pounds in a week? Unsure   Do you have a log of your daily weights (if so, list)? No   Are you currently taking a fluid pill? No   Are you currently SOB? No   Have you traveled recently? No     Swelling to the point he can barely walk. Swelling has been like this since Thursday, the day after procedure with Dr. Fletcher Anon. Please advise.

## 2022-04-28 ENCOUNTER — Other Ambulatory Visit
Admission: RE | Admit: 2022-04-28 | Discharge: 2022-04-28 | Disposition: A | Discharge status: Home / Self Care | Payer: Medicare Other | Attending: Cardiology | Admitting: Cardiology

## 2022-04-28 ENCOUNTER — Encounter: Payer: Self-pay | Admitting: Cardiology

## 2022-04-28 ENCOUNTER — Ambulatory Visit (INDEPENDENT_AMBULATORY_CARE_PROVIDER_SITE_OTHER): Payer: Medicare Other | Admitting: Cardiology

## 2022-04-28 VITALS — BP 116/78 | HR 72 | Ht 69.0 in | Wt 221.8 lb

## 2022-04-28 DIAGNOSIS — M79672 Pain in left foot: Secondary | ICD-10-CM | POA: Diagnosis not present

## 2022-04-28 DIAGNOSIS — I739 Peripheral vascular disease, unspecified: Secondary | ICD-10-CM | POA: Diagnosis not present

## 2022-04-28 DIAGNOSIS — I10 Essential (primary) hypertension: Secondary | ICD-10-CM | POA: Diagnosis not present

## 2022-04-28 DIAGNOSIS — E782 Mixed hyperlipidemia: Secondary | ICD-10-CM

## 2022-04-28 DIAGNOSIS — R600 Localized edema: Secondary | ICD-10-CM | POA: Diagnosis not present

## 2022-04-28 LAB — URIC ACID: Uric Acid, Serum: 6.4 mg/dL (ref 3.7–8.6)

## 2022-04-28 NOTE — Patient Instructions (Addendum)
Medication Instructions:  Your physician recommends that you continue on your current medications as directed. Please refer to the Current Medication list given to you today.  *If you need a refill on your cardiac medications before your next appointment, please call your pharmacy*   Lab Work: Uric acid level - Please go to the [facility]. You will check in at the front desk to the right as you walk into the atrium. Valet Parking is offered if needed. - No appointment needed. You may go any day between 7 am and 6 pm.  If you have labs (blood work) drawn today and your tests are completely normal, you will receive your results only by: [place] (if you have MyChart) OR A paper copy in the mail If you have any lab test that is abnormal or we need to change your treatment, we will call you to review the results.   Testing/Procedures: Your physician has requested that you have a lower or upper extremity venous duplex. This test is an ultrasound of the veins in the legs or arms. It looks at venous blood flow that carries blood from the heart to the legs or arms. Allow one hour for a Lower Venous exam. Allow thirty minutes for an Upper Venous exam. There are no restrictions or special instructions.   Follow-Up: At [facility], you and your health needs are our priority.  As part of our continuing mission to provide you with exceptional heart care, we have created designated Provider Care Teams.  These Care Teams include your primary Cardiologist (physician) and Advanced Practice Providers (APPs -  Physician Assistants and Nurse Practitioners) who all work together to provide you with the care you need, when you need it.  We recommend signing up for the patient portal called "MyChart".  Sign up information is provided on this After Visit Summary.  MyChart is used to connect with patients for Virtual Visits (Telemedicine).  Patients are able to view lab/test results, encounter  notes, upcoming appointments, etc.  Non-urgent messages can be sent to your provider as well.   To learn more about what you can do with MyChart, go to [email or web address].    Your next appointment:    As scheduled on 05/20/2022  The format for your next appointment:   In Person  Provider:   Kathlyn Sacramento, MD   Important Information About Sugar

## 2022-04-28 NOTE — Progress Notes (Signed)
Normal results so not a gout inflammatory response.

## 2022-04-28 NOTE — Progress Notes (Signed)
Cardiology Clinic Note   Patient Name: Jonathan [name] Date of Encounter: 04/28/2022  Primary Care Provider:  [name], MD Primary Cardiologist:  None  Patient Profile    76 year old male with a past medical history of essential hypertension, diabetes, gastroesophageal reflux disease, rheumatoid arthritis, chronic kidney disease, peripheral arterial disease, and obesity, who is here today to follow-up on complaints of bilateral lower extremity swelling and pain status post procedure.  Past Medical History    Past Medical History:  Diagnosis Date   Arthritis    ra, sees dr Sedonia Small   Coronary artery disease    saw dr zachery in [place] 20 yrs ago, released from cardiology   Diabetes mellitus without complication ([place])    type 2   GERD (gastroesophageal reflux disease)    Hypercholesteremia    Hypertension    Urethral stricture    Past Surgical History:  Procedure Laterality Date   ABDOMINAL AORTOGRAM W/LOWER EXTREMITY N/A 04/20/2022   Procedure: ABDOMINAL AORTOGRAM W/LOWER EXTREMITY;  Surgeon: Wellington Hampshire, MD;  Location: [place] [facility];  Service: Cardiovascular;  Laterality: N/A;   BACK SURGERY     x 3 lower back   CARDIAC CATHETERIZATION  2000   small amt plaque relased from cardiology 20 yrs ago   CHOLECYSTECTOMY     COLONOSCOPY WITH PROPOFOL N/A 10/26/2021   Procedure: COLONOSCOPY WITH PROPOFOL;  Surgeon: Eloise Harman, DO;  [facility];  Service: Endoscopy;  Laterality: N/A;  To arrive at [place] N/A 04/17/2019   Procedure: CYSTOSCOPY WITH URETHRAL DILATATION, CYSTOGRAM;  Surgeon: Ceasar Mons, MD;  Location: [facility];  Service: Urology;  Laterality: N/A;   EYE SURGERY Bilateral    cataracts   IR RADIOLOGIST EVAL & MGMT  07/12/2021   PERIPHERAL VASCULAR BALLOON ANGIOPLASTY  04/20/2022   Procedure: PERIPHERAL VASCULAR BALLOON ANGIOPLASTY;  Surgeon: Wellington Hampshire, MD;  Location: [place] [facility];  Service: Cardiovascular;;  aborted; unable to cross   POLYPECTOMY  10/26/2021   Procedure: POLYPECTOMY INTESTINAL;  Surgeon: Eloise Harman, DO;  [facility];  Service: Endoscopy;;   urethral stricture surgery  15 yrs ago    Allergies  Allergies  Allergen Reactions   Ciprofloxacin Itching   Mosquito (Diagnostic) Itching   Oxycodone-Acetaminophen Rash    History of Present Illness    Jonathan [name] is a 76 year old male with previously mentioned past medical history of hypertension, diabetes, GERD, chronic kidney disease, peripheral arterial disease, and obesity.  He has a longstanding history of peripheral arterial disease.  He underwent noninvasive vascular evaluation in February 2023 which showed moderately reduced ABI bilaterally and 0.65.  Duplex showed possible occluded peroneal artery bilaterally but no other obstructive disease.  He continues to report worsening discomfort especially at the second toe bilaterally.  He is having symptoms at rest and at night.  In addition his feet are cool to the touch.  He has also had issues recently with balance and has not had a hard time walking due to foot discomfort.  He recently underwent peripheral vascular catheterization on 04/20/2022.  There was no significant arterial iliac disease.  Left lower extremity had mild to moderate distal SFA stenosis with occluded anterior tibial artery proximally, occluded peroneal artery and occluded posterior tibial arteries.  Reconstitution of the anterior tibial artery and posterior tibial arteries via collaterals in the distal portion just above the ankle.  Attempted unsuccessful angioplasty of the left anterior tibial artery via the antegrade and retrograde approach due to the inability to cross the long calcified lesion.  At that time the right lower extremity was not imaged due to his underlying chronic kidney disease to save contrast.  He was  sent for referral for vascular to evaluate for a left femoral/popliteal to posterior tibial artery bypass.  He is accompanied to the triage line 04/27/2022 with complaints of pain to his bilateral lower extremities and swelling over the last couple of days.  He was sent for BMP to reevaluate kidney function and schedule follow-up appointment today.  He returns to clinic today accompanied by his wife. He states that he has pain to the ball of his foot on the left foot and the left great toe and the right foot is the top of his foot where his shoe touches.  He noted that he had swelling to his bilateral lower extremities but with elevation the swelling did not go away with the exception of his left leg.  His wife states that he eaten bacon for several days in a row but once the swelling started she had advised him to back off of the bacon to decrease his sodium intake and his swelling started to dissipate.  He continues to complain of pain to the left extremity that is markedly more swollen than the right extremity with some slight redness that he was concerned there could be a clot today.  He denies any other associated symptoms of chest pain, shortness of breath, dyspnea on exertion, weight gain, early satiety, elevated blood pressure, or visual changes.  Home Medications    Current Outpatient Medications  Medication Sig Dispense Refill   albuterol (VENTOLIN HFA) 108 (90 Base) MCG/ACT inhaler Inhale 1-2 puffs into the lungs every 6 (six) hours as needed for wheezing or shortness of breath.     amLODipine (NORVASC) 10 MG tablet TAKE 1 TABLET BY MOUTH DAILY 100 tablet 2   aspirin EC 81 MG tablet Take 81 mg by mouth daily. Swallow whole.     cholecalciferol (VITAMIN D3) 25 MCG (1000 UNIT) tablet Take 1,000 Units by mouth daily.     cyanocobalamin (VITAMIN B12) 1000 MCG tablet Take 1,000 mcg by mouth daily.     finasteride (PROSCAR) 5 MG tablet Take 1 tablet (5 mg total) by mouth daily. 462 tablet 2    folic acid (FOLVITE) 1 MG tablet Take 1 mg by mouth daily.     gabapentin (NEURONTIN) 300 MG capsule Take 1 capsule (300 mg total) by mouth at bedtime. 30 capsule 5   glipiZIDE (GLUCOTROL) 5 MG tablet Take 1 tablet (5 mg total) by mouth 2 (two) times daily before a meal. (Patient taking differently: Take 5 mg by mouth daily before breakfast.) 30 tablet 0   hydrocortisone (ANUSOL-HC) 2.5 % rectal cream Place 1 Application rectally 2 (two) times daily. For 10 days. May repeat cycle if needed. (Patient taking differently: Place 1 Application rectally 2 (two) times daily as needed for hemorrhoids or anal itching. For 10 days. May repeat cycle if needed.) 30 g 1   losartan (COZAAR) 100 MG tablet Take 1 tablet (100 mg total) by mouth daily. 90 tablet 0   Menthol, Topical Analgesic, (BLUE-EMU MAXIMUM STRENGTH EX) Apply 1 Application topically daily as needed (pain).     methotrexate (RHEUMATREX) 2.5 MG tablet Take 12.5 mg by mouth every Sunday. Caution:Chemotherapy. Protect from light. Take 5 tablets PO once weekly.  metoprolol tartrate (LOPRESSOR) 50 MG tablet TAKE 1 TABLET BY MOUTH TWICE  DAILY 200 tablet 2   omeprazole (PRILOSEC) 20 MG capsule Take 1 capsule (20 mg total) by mouth daily. 30 capsule 5   OVER THE COUNTER MEDICATION Take 2 tablets by mouth daily. Omega XL (not Fish oil)     predniSONE (DELTASONE) 1 MG tablet Take 1 mg by mouth daily with breakfast.     rosuvastatin (CRESTOR) 10 MG tablet TAKE 1 TABLET BY MOUTH DAILY 90 tablet 3   silodosin (RAPAFLO) 8 MG CAPS capsule Take 1 capsule (8 mg total) by mouth in the morning and at bedtime. (Patient taking differently: Take 8 mg by mouth at bedtime.) 200 capsule 2   triamcinolone cream (KENALOG) 0.1 % Apply 1 Application topically daily as needed (itching). 80 g 1   vitamin E 180 MG (400 UNITS) capsule Take 400 Units by mouth daily.     No current facility-administered medications for this visit.     Family History    Family History   Problem Relation Age of Onset   Heart Problems Father    He indicated that his mother is deceased. He indicated that his father is deceased.  Social History    Social History   Socioeconomic History   Marital status: Married    Spouse name: Not on file   Number of children: Not on file   Years of education: Not on file   Highest education level: Not on file  Occupational History   Not on file  Tobacco Use   Smoking status: Never   Smokeless tobacco: Never  Vaping Use   Vaping Use: Never used  Substance and Sexual Activity   Alcohol use: No   Drug use: No   Sexual activity: Not on file  Other Topics Concern   Not on file  Social History Narrative   Not on file   Social Determinants of Health   Financial Resource Strain: Low Risk  (11/15/2021)   Overall Financial Resource Strain (CARDIA)    Difficulty of Paying Living Expenses: Not hard at all  Food Insecurity: No Food Insecurity (11/15/2021)   Hunger Vital Sign    Worried About Running Out of Food in the Last Year: Never true    [place] in the Last Year: Never true  Transportation Needs: No Transportation Needs (11/15/2021)   PRAPARE - Hydrologist (Medical): No    Lack of Transportation (Non-Medical): No  Physical Activity: Sufficiently Active (11/15/2021)   Exercise Vital Sign    Days of Exercise per Week: 5 days    Minutes of Exercise per Session: 30 min  Stress: No Stress Concern Present (11/15/2021)   [place]    Feeling of Stress : Not at all  Social Connections: Moderately Integrated (11/15/2021)   Social Connection and Isolation Panel [NHANES]    Frequency of Communication with Friends and Family: Three times a week    Frequency of Social Gatherings with Friends and Family: Twice a week    Attends Religious Services: More than 4 times per year    Active Member of [employer] or Organizations: No    Attends English as a second language teacher Meetings: Never    Marital Status: Married  Human resources officer Violence: Not At Risk (11/15/2021)   Humiliation, Afraid, Rape, and Kick questionnaire    Fear of Current or Ex-Partner: No    Emotionally Abused: No  Physically Abused: No    Sexually Abused: No     Review of Systems    General:  No chills, fever, night sweats or weight changes.  Endorses fatigue Cardiovascular:  No chest pain, dyspnea on exertion, endorses edema, but denies orthopnea, palpitations, paroxysmal nocturnal dyspnea. Dermatological: No rash, lesions/masses Respiratory: No cough, dyspnea Urologic: No hematuria, dysuria Musculoskeletal: Endorses pain to both feet and his left leg Abdominal:   No nausea, vomiting, diarrhea, bright red blood per rectum, melena, or hematemesis Neurologic:  No visual changes, wkns, changes in mental status. All other systems reviewed and are otherwise negative except as noted above.   Physical Exam    VS:  BP 116/78 (BP Location: Left Arm, Patient Position: Sitting, Cuff Size: Normal)   Pulse 72   Ht '5\' 9"'$  (1.753 m)   Wt 221 lb 12.8 oz (100.6 kg)   SpO2 98%   BMI 32.75 kg/m  , BMI Body mass index is 32.75 kg/m.     GEN: Well nourished, well developed, in no acute distress. HEENT: normal. Neck: Supple, no JVD, carotid bruits, or masses. Cardiac: RRR, no murmurs, rubs, or gallops. No clubbing, cyanosis, edema.  Radials 2+ and equal bilaterally.  Bilateral feet are cool to the touch and he does have a callus to the tip of the left with significant tenderness, there is mild redness noted to the left lower extremity without streaking or warmth  Respiratory:  Respirations regular and unlabored, clear to auscultation bilaterally. GI: Soft, nontender, nondistended, obese, BS + x 4. MS: no deformity or atrophy. Skin: warm and dry, no rash. Neuro:  Strength and sensation are intact. Psych: Normal affect.  Accessory Clinical Findings    ECG personally reviewed by  me today-no new tracings completed today  Lab Results  Component Value Date   WBC 6.4 04/14/2022   HGB 12.2 (L) 04/14/2022   HCT 37.5 (L) 04/14/2022   MCV 93.1 04/14/2022   PLT 222 04/14/2022   Lab Results  Component Value Date   CREATININE 1.37 (H) 04/27/2022   BUN 15 04/27/2022   NA 136 04/27/2022   K 3.8 04/27/2022   CL 107 04/27/2022   CO2 24 04/27/2022   Lab Results  Component Value Date   ALT 29 11/24/2021   AST 39 11/24/2021   ALKPHOS 103 11/24/2021   BILITOT 0.4 11/24/2021   Lab Results  Component Value Date   CHOL 135 11/24/2021   HDL 40 11/24/2021   LDLCALC 69 11/24/2021   TRIG 152 (H) 11/24/2021   CHOLHDL 3.4 11/24/2021    Lab Results  Component Value Date   HGBA1C 6.4 (H) 03/28/2022    Assessment & Plan   1.  Peripheral arterial disease with continued wrist pain.  He did previously undergone successful peripheral angioplasty procedure on 14/48/1856 which elicited a referral to vascular for bypass procedure.  His symptoms continued to progress and he was having significant discomfort today.  With increased amount of pork that he is over the past week he was worried that he may have gout with extreme pain and tenderness to the left great toe and side of his foot.  He has been sent for a uric acid level.  His feet are cool to the touch with palpable pulses but nonpalpable.  He also has unilateral swelling to the bilateral lower extremities with the left being markedly larger than the right without pitting edema.  With the redness noted to the leg he has been sent for repeat vascular  ultrasound to rule out DVT as well.  He and his wife have also been encouraged to keep the follow-up appointment with vascular on 05/06/2022.  On aspirin therapy as well as statin.  2.  Unilateral leg swelling with pain.  He recently had procedure completed.  Continues with pain and has been sent for an ultrasound of his left lower extremity.  3.  Hypertension with blood pressure today  116/78 which is well-controlled.  There were no changes made to his current medication regimen.  He has been continued on Norvasc, losartan, metoprolol titrate  4.  Hyperlipidemia with an LDL of 69.  He has been continued on rosuvastatin 10 mg daily.  5.  Disposition patient return to clinic to see MD/APP as previously scheduled in 3 weeks or sooner if needed  Khyree Carillo, NP 04/28/2022, 11:31 AM

## 2022-04-29 ENCOUNTER — Encounter: Payer: Self-pay | Admitting: Internal Medicine

## 2022-04-29 ENCOUNTER — Ambulatory Visit
Admission: RE | Admit: 2022-04-29 | Discharge: 2022-04-29 | Disposition: A | Discharge status: Home / Self Care | Payer: Medicare Other | Source: Ambulatory Visit | Attending: Cardiology | Admitting: Cardiology

## 2022-04-29 ENCOUNTER — Ambulatory Visit: Payer: Medicare Other | Admitting: Family Medicine

## 2022-04-29 DIAGNOSIS — R600 Localized edema: Secondary | ICD-10-CM | POA: Insufficient documentation

## 2022-04-29 NOTE — Progress Notes (Signed)
Negative for blood clot to the left lower extremity

## 2022-05-03 ENCOUNTER — Other Ambulatory Visit: Payer: Self-pay

## 2022-05-03 ENCOUNTER — Telehealth: Payer: Self-pay | Admitting: Internal Medicine

## 2022-05-03 DIAGNOSIS — K219 Gastro-esophageal reflux disease without esophagitis: Secondary | ICD-10-CM

## 2022-05-03 MED ORDER — OMEPRAZOLE 20 MG PO CPDR: 20.0000 mg | DELAYED_RELEASE_CAPSULE | Freq: Every day | ORAL | 5 refills | Status: DC

## 2022-05-03 NOTE — Telephone Encounter (Signed)
Prescription Request  05/03/2022  Is this a "Controlled Substance" medicine? No  LOV: 04/11/2022  What is the name of the medication or equipment?  omeprazole (PRILOSEC) 20 MG capsule    Have you contacted your pharmacy to request a refill? No   Which pharmacy would you like this sent to?  [facility], [place] [address] Phone: [phone] Fax: [place], [address] [place] [address] Phone: [phone] Fax: [phone]    Patient notified that their request is being sent to the clinical staff for review and that they should receive a response within 2 business days.   Please advise at [facility] [phone]

## 2022-05-03 NOTE — Telephone Encounter (Signed)
Refill sent.

## 2022-05-04 ENCOUNTER — Ambulatory Visit (HOSPITAL_COMMUNITY): Payer: 59 | Admitting: Physical Therapy

## 2022-05-04 ENCOUNTER — Telehealth (HOSPITAL_COMMUNITY): Payer: Self-pay | Admitting: Physical Therapy

## 2022-05-04 ENCOUNTER — Encounter (HOSPITAL_COMMUNITY): Payer: Medicare Other | Admitting: Physical Therapy

## 2022-05-04 NOTE — Telephone Encounter (Signed)
Pt did not show for appt making 3rd consecutive since evaluation on 12/6.  Unable to reach by phone.  Remaining appts removed and evaluating therapist notified to discharge due to noncompliance.  Teena Irani, PTA/CLT [place] Ph: [phone]

## 2022-05-05 ENCOUNTER — Ambulatory Visit (INDEPENDENT_AMBULATORY_CARE_PROVIDER_SITE_OTHER): Payer: Medicare Other | Admitting: Podiatry

## 2022-05-05 ENCOUNTER — Ambulatory Visit (INDEPENDENT_AMBULATORY_CARE_PROVIDER_SITE_OTHER): Payer: Medicare Other

## 2022-05-05 DIAGNOSIS — L97522 Non-pressure chronic ulcer of other part of left foot with fat layer exposed: Secondary | ICD-10-CM

## 2022-05-05 DIAGNOSIS — I999 Unspecified disorder of circulatory system: Secondary | ICD-10-CM | POA: Diagnosis not present

## 2022-05-05 DIAGNOSIS — M2041 Other hammer toe(s) (acquired), right foot: Secondary | ICD-10-CM

## 2022-05-05 DIAGNOSIS — L84 Corns and callosities: Secondary | ICD-10-CM

## 2022-05-05 DIAGNOSIS — M2042 Other hammer toe(s) (acquired), left foot: Secondary | ICD-10-CM | POA: Diagnosis not present

## 2022-05-05 DIAGNOSIS — E1142 Type 2 diabetes mellitus with diabetic polyneuropathy: Secondary | ICD-10-CM

## 2022-05-05 NOTE — Progress Notes (Signed)
Office Note     CC: Bilateral lower extremity critical ischemia Requesting Provider:  [name], MD  HPI: Jonathan [name] is a 77 y.o. (1945/05/14) male presenting at the request of .[name], MD bilateral lower extremity critical limb ischemia  [name] is a patient of my colleague, Dr. Fletcher Anon.  He recently underwent left lower extremity angiogram in an effort to define and improve perfusion or rest pain.  Angiogram demonstrated severe tibial disease with reconstitution of the dorsalis pedis and distal posterior tibial arteries.  Pedal access, retrograde approach was attempted, but failed.  I was asked to see him to assess his candidacy for open bypass surgery.  On exam, Jonathan [name] was doing well, accompanied by his wife.  Originally from [place], they now reside in [place].  [name] has a longstanding history of type 1 diabetes. He notes bilateral lower extremity rest pain at night with left-sided tissue loss between the fourth and fifth digits in the webspace.  This has been present for roughly 1 month and has not worsened, but has not healed either.  His main complaint today is bilateral lower extremity swelling, which has been present over the last 3 weeks.  Both he and his wife feel he has never had lower extremity swelling of this magnitude.  He is also appreciated some breathing difficulties, noting he used to easily climb 1 flight of stairs without issue.  Now he is winded by the time he reaches the top.  The pt is  on a statin for cholesterol management.  The pt is  on a daily aspirin.   Other AC:  - The pt is  on medication for hypertension.   The pt is  diabetic.  Tobacco hx:  -  Past Medical History:  Diagnosis Date   Arthritis    ra, sees dr Sedonia Small   Coronary artery disease    saw dr zachery in [place] 20 yrs ago, released from cardiology   Diabetes mellitus without complication ([place])    type 2   GERD (gastroesophageal reflux disease)     Hypercholesteremia    Hypertension    Urethral stricture     Past Surgical History:  Procedure Laterality Date   ABDOMINAL AORTOGRAM W/LOWER EXTREMITY N/A 04/20/2022   Procedure: ABDOMINAL AORTOGRAM W/LOWER EXTREMITY;  Surgeon: Wellington Hampshire, MD;  Location: [facility];  Service: Cardiovascular;  Laterality: N/A;   BACK SURGERY     x 3 lower back   CARDIAC CATHETERIZATION  2000   small amt plaque relased from cardiology 20 yrs ago   CHOLECYSTECTOMY     COLONOSCOPY WITH PROPOFOL N/A 10/26/2021   Procedure: COLONOSCOPY WITH PROPOFOL;  Surgeon: Eloise Harman, DO;  [facility];  Service: Endoscopy;  Laterality: N/A;  To arrive at [place] N/A 04/17/2019   Procedure: CYSTOSCOPY WITH URETHRAL DILATATION, CYSTOGRAM;  Surgeon: Ceasar Mons, MD;  Location: [facility];  Service: Urology;  Laterality: N/A;   EYE SURGERY Bilateral    cataracts   IR RADIOLOGIST EVAL & MGMT  07/12/2021   PERIPHERAL VASCULAR BALLOON ANGIOPLASTY  04/20/2022   Procedure: PERIPHERAL VASCULAR BALLOON ANGIOPLASTY;  Surgeon: Wellington Hampshire, MD;  Location: [facility];  Service: Cardiovascular;;  aborted; unable to cross   POLYPECTOMY  10/26/2021   Procedure: POLYPECTOMY INTESTINAL;  Surgeon: Eloise Harman, DO;  [facility];  Service: Endoscopy;;   urethral stricture surgery  15 yrs ago    Social History   Socioeconomic History   Marital status: Married    Spouse name: Not on file   Number of children: Not on file   Years of education: Not on file   Highest education level: Not on file  Occupational History   Not on file  Tobacco Use   Smoking status: Never   Smokeless tobacco: Never  Vaping Use   Vaping Use: Never used  Substance and Sexual Activity   Alcohol use: No   Drug use: No   Sexual activity: Not on file  Other Topics Concern   Not on file  Social History Narrative   Not on file    Social Determinants of Health   Financial Resource Strain: Low Risk  (11/15/2021)   Overall Financial Resource Strain (CARDIA)    Difficulty of Paying Living Expenses: Not hard at all  Food Insecurity: No Food Insecurity (11/15/2021)   Hunger Vital Sign    Worried About Running Out of Food in the Last Year: Never true    [place] in the Last Year: Never true  Transportation Needs: No Transportation Needs (11/15/2021)   PRAPARE - Hydrologist (Medical): No    Lack of Transportation (Non-Medical): No  Physical Activity: Sufficiently Active (11/15/2021)   Exercise Vital Sign    Days of Exercise per Week: 5 days    Minutes of Exercise per Session: 30 min  Stress: No Stress Concern Present (11/15/2021)   [place]    Feeling of Stress : Not at all  Social Connections: Moderately Integrated (11/15/2021)   Social Connection and Isolation Panel [NHANES]    Frequency of Communication with Friends and Family: Three times a week    Frequency of Social Gatherings with Friends and Family: Twice a week    Attends Religious Services: More than 4 times per year    Active Member of [employer] or Organizations: No    Attends Archivist Meetings: Never    Marital Status: Married  Human resources officer Violence: Not At Risk (11/15/2021)   Humiliation, Afraid, Rape, and Kick questionnaire    Fear of Current or Ex-Partner: No    Emotionally Abused: No    Physically Abused: No    Sexually Abused: No   Family History  Problem Relation Age of Onset   Heart Problems Father     Current Outpatient Medications  Medication Sig Dispense Refill   albuterol (VENTOLIN HFA) 108 (90 Base) MCG/ACT inhaler Inhale 1-2 puffs into the lungs every 6 (six) hours as needed for wheezing or shortness of breath.     amLODipine (NORVASC) 10 MG tablet TAKE 1 TABLET BY MOUTH DAILY 100 tablet 2   aspirin EC 81 MG tablet Take  81 mg by mouth daily. Swallow whole.     cholecalciferol (VITAMIN D3) 25 MCG (1000 UNIT) tablet Take 1,000 Units by mouth daily.     cyanocobalamin (VITAMIN B12) 1000 MCG tablet Take 1,000 mcg by mouth daily.     finasteride (PROSCAR) 5 MG tablet Take 1 tablet (5 mg total) by mouth daily. 161 tablet 2   folic acid (FOLVITE) 1 MG tablet Take 1 mg by mouth daily.     gabapentin (NEURONTIN) 300 MG capsule Take 1 capsule (300 mg total) by mouth at bedtime. 30 capsule 5   glipiZIDE (GLUCOTROL) 5 MG tablet Take 1 tablet (5 mg total) by mouth 2 (two)  times daily before a meal. (Patient taking differently: Take 5 mg by mouth daily before breakfast.) 30 tablet 0   hydrocortisone (ANUSOL-HC) 2.5 % rectal cream Place 1 Application rectally 2 (two) times daily. For 10 days. May repeat cycle if needed. (Patient taking differently: Place 1 Application rectally 2 (two) times daily as needed for hemorrhoids or anal itching. For 10 days. May repeat cycle if needed.) 30 g 1   losartan (COZAAR) 100 MG tablet Take 1 tablet (100 mg total) by mouth daily. 90 tablet 0   Menthol, Topical Analgesic, (BLUE-EMU MAXIMUM STRENGTH EX) Apply 1 Application topically daily as needed (pain).     methotrexate (RHEUMATREX) 2.5 MG tablet Take 12.5 mg by mouth every Sunday. Caution:Chemotherapy. Protect from light. Take 5 tablets PO once weekly.     metoprolol tartrate (LOPRESSOR) 50 MG tablet TAKE 1 TABLET BY MOUTH TWICE  DAILY 200 tablet 2   omeprazole (PRILOSEC) 20 MG capsule Take 1 capsule (20 mg total) by mouth daily. 30 capsule 5   OVER THE COUNTER MEDICATION Take 2 tablets by mouth daily. Omega XL (not Fish oil)     predniSONE (DELTASONE) 1 MG tablet Take 1 mg by mouth daily with breakfast.     rosuvastatin (CRESTOR) 10 MG tablet TAKE 1 TABLET BY MOUTH DAILY 90 tablet 3   silodosin (RAPAFLO) 8 MG CAPS capsule Take 1 capsule (8 mg total) by mouth in the morning and at bedtime. (Patient taking differently: Take 8 mg by mouth at  bedtime.) 200 capsule 2   triamcinolone cream (KENALOG) 0.1 % Apply 1 Application topically daily as needed (itching). 80 g 1   vitamin E 180 MG (400 UNITS) capsule Take 400 Units by mouth daily.     No current facility-administered medications for this visit.    Allergies  Allergen Reactions   Ciprofloxacin Itching   Mosquito (Diagnostic) Itching   Oxycodone-Acetaminophen Rash     REVIEW OF SYSTEMS:  '[X]'$  denotes positive finding, '[ ]'$  denotes negative finding Cardiac  Comments:  Chest pain or chest pressure:    Shortness of breath upon exertion:    Short of breath when lying flat:    Irregular heart rhythm:        Vascular    Pain in calf, thigh, or hip brought on by ambulation:    Pain in feet at night that wakes you up from your sleep:     Blood clot in your veins:    Leg swelling:         Pulmonary    Oxygen at home:    Productive cough:     Wheezing:         Neurologic    Sudden weakness in arms or legs:     Sudden numbness in arms or legs:     Sudden onset of difficulty speaking or slurred speech:    Temporary loss of vision in one eye:     Problems with dizziness:         Gastrointestinal    Blood in stool:     Vomited blood:         Genitourinary    Burning when urinating:     Blood in urine:        Psychiatric    Major depression:         Hematologic    Bleeding problems:    Problems with blood clotting too easily:        Skin    Rashes or ulcers:  Constitutional    Fever or chills:      PHYSICAL EXAMINATION:  Vitals:   05/06/22 0949  BP: (!) 143/84  Pulse: 73  Resp: 20  Temp: 98.1 F (36.7 C)  SpO2: 95%  Weight: 228 lb (103.4 kg)  Height: '5\' 9"'$  (1.753 m)    General:  WDWN in NAD; vital signs documented above Gait: Not observed HENT: WNL, normocephalic Pulmonary: normal non-labored breathing , without wheezing Cardiac: regular HR Abdomen: soft, NT, no masses Skin: without rashes Vascular Exam/Pulses:  Right Left   Radial 2+ (normal) 2+ (normal)  Ulnar 2+ (normal) 2+ (normal)  Femoral    Popliteal    DP absent absent  PT absent absent   Extremities: with ischemic changes, without Gangrene , without cellulitis; with open wounds;  Musculoskeletal: no muscle wasting or atrophy  Neurologic: A&O X 3;  No focal weakness or paresthesias are detected Psychiatric:  The pt has Normal affect.   Non-Invasive Vascular Imaging:   See angiogram    ASSESSMENT/PLAN: JARRED PURTEE is a 77 y.o. male presenting with left lower extremity critical limb ischemia with tissue loss, right lower extremity critical limb ischemia with rest pain.  I reviewed Lekeith's recent left lower extremity angiogram which demonstrated no named tibial vessels in the calf.  He has occlusion at the P3 segment of the popliteal artery with reconstitution in the dorsalis pedis and posterior tibial arteries.  Both of these plantar arteries are small.  Furthermore, I examined his vein mapping.  Veins in bilateral legs are borderline in regards to their candidacy to use his conduit.  I had a long discussion with Marv and his wife.  Unfortunately, his options are few.  Derrick could continue with medical management and pursue left-sided above-knee amputation should the wound progress.  I also offered high risk, below-knee popliteal artery to distal posterior tibial artery bypass with plans to use vein should the conduit looks suitable.  He is aware that this bypass has a high failure rate, especially if PTFE was necessary for the bypass.  I am also concerned that beyond the conduit, and poor outflow, he has poor inflow due to heart failure as demonstrated by his recent angiogram.  Jeriko asked that everything be done in an effort to save his leg, therefore I am happy to attempt revascularization.  Prior to attending revascularization, I would appreciate Vannak seeing Dr. Sophronia Simas in an effort to decrease the lower extremity edema he  currently has and ensure he does not require cardiac catheterization.  I have attached Dr. Sophronia Simas to this note, and plan to call Kerolos immediately after he is evaluated from a cardiac standpoint to schedule surgery.   Broadus John, MD [facility] [phone]

## 2022-05-05 NOTE — Progress Notes (Signed)
Subjective:  Patient ID: Jonathan [name], male    DOB: 1946-01-13,  MRN: [id number]  Chief Complaint  Patient presents with   Hammer Toe    Bilateral feet     77 y.o. male presents with concern for Left 5th toe pain. States he has a painful callus. Also worried about pain in the bilateral 2nd toe due to hammertoe deformity. Reports a history of DM 2  Past Medical History:  Diagnosis Date   Arthritis    ra, sees dr Sedonia Small   Coronary artery disease    saw dr zachery in [place] 20 yrs ago, released from cardiology   Diabetes mellitus without complication ([place])    type 2   GERD (gastroesophageal reflux disease)    Hypercholesteremia    Hypertension    Urethral stricture     Allergies  Allergen Reactions   Ciprofloxacin Itching   Mosquito (Diagnostic) Itching   Oxycodone-Acetaminophen Rash    ROS: Negative except as per HPI above  Objective:  General: AAO x3, NAD  Dermatological: Attention to the medial aspect of the 5th toe left foot there is maceration and open wound present to level of subcutaneous fat tissue. NO malodor drainage but maceration of the web space.  No probe to bone or erythema or edema of the L 5th toe.   Vascular:  Dorsalis Pedis artery and Posterior Tibial artery pedal pulses are 2/4 bilateral.  Capillary fill time < 3 sec to all digits.   Neruologic: Grossly intact via light touch bilateral. Protective threshold intact to all sites bilateral.   Musculoskeletal: No gross boney pedal deformities bilateral. No pain, crepitus, or limitation noted with foot and ankle range of motion bilateral. Muscular strength 5/5 in all groups tested bilateral.  Gait: Unassisted, Nonantalgic.     Radiographs:  Date: 05/05/22 XR both feet Weightbearing AP/Lateral/Oblique   Findings: digital contractures of the 2nd toe bilaterally Assessment:   1. Ulcer of left foot with fat layer exposed ([place])   2. Pre-ulcerative calluses   3. Hammer toes of both feet    4. Vascular abnormality   5. DM type 2 with diabetic peripheral neuropathy ([place])      Plan:  Patient was evaluated and treated and all questions answered.  Ulcer medial aspect of the L 5th toe to subcutaneous fat tissue. -We discussed the etiology and factors that are a part of the wound healing process.  We also discussed the risk of infection both soft tissue and osteomyelitis from open ulceration.  Discussed the risk of limb loss if this happens or worsens. -No debridment indicated at this time. -Dressed with betadine gauze in between 4th and 5th toe -Continue home dressing changes daily with placing betadine gauze between 4th and 5th toe -Continue off-loading with gauze between toes. -Vascular testing ordered due to concern for diminished pedal pulses bilaterally with DM and ulceration -Last antibiotics: No abx currently indicated -Imaging: x-ray reviewed, shows no signs of erosions, osteolysis, osteomyelitis or emphysema. - Recommend referral to wound care center for long term ongoing care of the 5th toe ulceration. Referral placed   #Hammertoe bilateral 2nd toe # Pre ulcerative callus distal tuft bilateral 2nd toe All symptomatic hyperkeratoses x2 bilateral 2nd toe were safely debrided with a sterile #15 blade to patient's level of comfort without incident. We discussed preventative and palliative care of these lesions including supportive and accommodative shoegear, padding, prefabricated and custom molded accommodative orthoses, use of a pumice stone and lotions/creams daily. - Recommend gel  toe caps for prevention of pressure on the distal tuft of the toe. These were dispensed to the patient.        Return in about 3 weeks (around 05/26/2022) for Follow up L 5th toe ulceration.          Everitt Amber, DPM [facility]

## 2022-05-06 ENCOUNTER — Ambulatory Visit (INDEPENDENT_AMBULATORY_CARE_PROVIDER_SITE_OTHER): Payer: Medicare Other | Admitting: Vascular Surgery

## 2022-05-06 ENCOUNTER — Encounter: Payer: Self-pay | Admitting: Vascular Surgery

## 2022-05-06 ENCOUNTER — Encounter (HOSPITAL_COMMUNITY): Payer: Self-pay

## 2022-05-06 VITALS — BP 143/84 | HR 73 | Temp 98.1°F | Resp 20 | Ht 69.0 in | Wt 228.0 lb

## 2022-05-06 DIAGNOSIS — I70223 Atherosclerosis of native arteries of extremities with rest pain, bilateral legs: Secondary | ICD-10-CM

## 2022-05-06 NOTE — Therapy (Signed)
PHYSICAL THERAPY DISCHARGE SUMMARY  Visits from Start of Care: 1  Current functional level related to goals / functional outcomes: NA   Remaining deficits: NA   Education / Equipment: NA   Patient agrees to discharge. Patient goals were not met. Patient is being discharged due to not returning since the last visit.   

## 2022-05-10 ENCOUNTER — Encounter (HOSPITAL_COMMUNITY): Payer: Medicare Other

## 2022-05-11 ENCOUNTER — Ambulatory Visit: Payer: Medicare Other | Admitting: Urology

## 2022-05-16 ENCOUNTER — Telehealth: Payer: Self-pay | Admitting: Internal Medicine

## 2022-05-16 ENCOUNTER — Ambulatory Visit: Payer: 59 | Admitting: Internal Medicine

## 2022-05-16 NOTE — Telephone Encounter (Signed)
Patient has missed 3 appts as of today (11/01/21, 04/29/22 & 05/16/22). How would you like to proceed?

## 2022-05-17 ENCOUNTER — Ambulatory Visit: Payer: 59 | Admitting: Vascular Surgery

## 2022-05-17 ENCOUNTER — Encounter (HOSPITAL_COMMUNITY): Payer: Medicare Other

## 2022-05-20 ENCOUNTER — Ambulatory Visit (HOSPITAL_COMMUNITY): Payer: 59 | Attending: Cardiovascular Disease | Admitting: Cardiovascular Disease

## 2022-05-20 ENCOUNTER — Encounter (HOSPITAL_COMMUNITY): Payer: Self-pay | Admitting: Cardiovascular Disease

## 2022-05-20 ENCOUNTER — Ambulatory Visit: Payer: 59 | Admitting: Cardiovascular Disease

## 2022-05-20 ENCOUNTER — Encounter: Payer: Self-pay | Admitting: Cardiovascular Disease

## 2022-05-20 ENCOUNTER — Ambulatory Visit (INDEPENDENT_AMBULATORY_CARE_PROVIDER_SITE_OTHER): Payer: 59 | Admitting: Vascular Surgery

## 2022-05-20 VITALS — BP 110/70 | HR 79 | Ht 69.0 in | Wt 223.5 lb

## 2022-05-20 DIAGNOSIS — I70223 Atherosclerosis of native arteries of extremities with rest pain, bilateral legs: Secondary | ICD-10-CM

## 2022-05-20 DIAGNOSIS — E782 Mixed hyperlipidemia: Secondary | ICD-10-CM | POA: Insufficient documentation

## 2022-05-20 DIAGNOSIS — R0602 Shortness of breath: Secondary | ICD-10-CM | POA: Insufficient documentation

## 2022-05-20 DIAGNOSIS — R079 Chest pain, unspecified: Secondary | ICD-10-CM | POA: Diagnosis not present

## 2022-05-20 DIAGNOSIS — I10 Essential (primary) hypertension: Secondary | ICD-10-CM | POA: Diagnosis not present

## 2022-05-20 DIAGNOSIS — I739 Peripheral vascular disease, unspecified: Secondary | ICD-10-CM | POA: Diagnosis not present

## 2022-05-20 MED ORDER — HYDROCHLOROTHIAZIDE 25 MG PO TABS: 25.0000 mg | ORAL_TABLET | Freq: Every day | ORAL | 0 refills | Status: DC

## 2022-05-20 NOTE — Progress Notes (Signed)
I called the pt today to check in regarding his lower extremity rest pain.  No major changes. No wounds.  Plans to undergo stress echo with Dr. Fletcher Anon prior to any open bypass surgery. Will schedule for clinic appointment on February 16th.  I asked the him call my office should any lower extremity changes occur.   Jonathan [name]

## 2022-05-20 NOTE — Progress Notes (Signed)
Cardiology Office Note   Date:  05/20/2022   ID:  Jonathan [name], DOB [birth year], MRN [id number]  PCP:  [name], MD  Cardiologist:   Kathlyn Sacramento, MD   Chief Complaint  Patient presents with   Other    1 month f/u c/o edema legs/feet and toe issues. Meds reviewed verbally with pt.      History of Present Illness: Jonathan [name] is a 77 y.o. male who is here today for follow-up visit regarding peripheral arterial disease.  He has past medical history of essential hypertension, diabetes mellitus, GERD, rheumatoid arthritis, chronic kidney disease and obesity.  He was seen by cardiology in the past in [place] and [place] but subsequently released.  He reports having cardiac catheterization many years ago and was told about mild nonobstructive disease.  He is followed for peripheral arterial disease mainly due to below the knee disease.  He underwent noninvasive vascular evaluation in February of this year which showed moderately reduced ABI bilaterally at 0.65.  Duplex showed possible occluded peroneal artery bilaterally but no other obstructive disease. He was seen recently for worsening left foot pain at rest. I proceeded with angiography last month which showed no significant aortoiliac disease, on the left side, there was mild to moderate SFA stenosis and no significant popliteal disease.  All tibial and peroneal vessels were occluded with reconstitution of the anterior tibial artery and posterior tibial artery via collaterals in the distal portion just above the ankle.  I attempted angioplasty of the anterior tibial artery antegrade and retrograde but was not able to cross due to long calcified occlusion.  I consulted Dr. Virl Cagey for bypass but the patient was found to have borderline quality vein conduits. Fortunately, he currently has no tissue loss.  His left foot pain seems to be stable and he seems to be mostly bothered by lower extremity edema that is much worse on the  left side.  In addition, he reports some shortness of breath.  He also reports recent episodes of substernal chest burning sensation both after eating or with exertion.  He is not very active and uses a wheelchair frequently.   Past Medical History:  Diagnosis Date   Arthritis    ra, sees dr Sedonia Small   Coronary artery disease    saw dr zachery in [place] 20 yrs ago, released from cardiology   Diabetes mellitus without complication ([place])    type 2   GERD (gastroesophageal reflux disease)    Hypercholesteremia    Hypertension    Urethral stricture     Past Surgical History:  Procedure Laterality Date   ABDOMINAL AORTOGRAM W/LOWER EXTREMITY N/A 04/20/2022   Procedure: ABDOMINAL AORTOGRAM W/LOWER EXTREMITY;  Surgeon: Wellington Hampshire, MD;  Location: [facility];  Service: Cardiovascular;  Laterality: N/A;   BACK SURGERY     x 3 lower back   CARDIAC CATHETERIZATION  2000   small amt plaque relased from cardiology 20 yrs ago   CHOLECYSTECTOMY     COLONOSCOPY WITH PROPOFOL N/A 10/26/2021   Procedure: COLONOSCOPY WITH PROPOFOL;  Surgeon: Eloise Harman, DO;  [facility];  Service: Endoscopy;  Laterality: N/A;  To arrive at [place] N/A 04/17/2019   Procedure: CYSTOSCOPY WITH URETHRAL DILATATION, CYSTOGRAM;  Surgeon: Ceasar Mons, MD;  Location: [facility];  Service: Urology;  Laterality: N/A;   EYE SURGERY Bilateral    cataracts   IR  RADIOLOGIST EVAL & MGMT  07/12/2021   PERIPHERAL VASCULAR BALLOON ANGIOPLASTY  04/20/2022   Procedure: PERIPHERAL VASCULAR BALLOON ANGIOPLASTY;  Surgeon: Wellington Hampshire, MD;  Location: [facility];  Service: Cardiovascular;;  aborted; unable to cross   POLYPECTOMY  10/26/2021   Procedure: POLYPECTOMY INTESTINAL;  Surgeon: Eloise Harman, DO;  [facility];  Service: Endoscopy;;   urethral stricture surgery  15 yrs ago     Current  Outpatient Medications  Medication Sig Dispense Refill   albuterol (VENTOLIN HFA) 108 (90 Base) MCG/ACT inhaler Inhale 1-2 puffs into the lungs every 6 (six) hours as needed for wheezing or shortness of breath.     amLODipine (NORVASC) 10 MG tablet TAKE 1 TABLET BY MOUTH DAILY 100 tablet 2   aspirin EC 81 MG tablet Take 81 mg by mouth daily. Swallow whole.     cholecalciferol (VITAMIN D3) 25 MCG (1000 UNIT) tablet Take 1,000 Units by mouth daily.     cyanocobalamin (VITAMIN B12) 1000 MCG tablet Take 1,000 mcg by mouth daily.     finasteride (PROSCAR) 5 MG tablet Take 1 tablet (5 mg total) by mouth daily. 009 tablet 2   folic acid (FOLVITE) 1 MG tablet Take 1 mg by mouth daily.     gabapentin (NEURONTIN) 300 MG capsule Take 1 capsule (300 mg total) by mouth at bedtime. 30 capsule 5   glipiZIDE (GLUCOTROL) 5 MG tablet Take 1 tablet (5 mg total) by mouth 2 (two) times daily before a meal. (Patient taking differently: Take 5 mg by mouth daily before breakfast.) 30 tablet 0   hydrocortisone (ANUSOL-HC) 2.5 % rectal cream Place 1 Application rectally 2 (two) times daily. For 10 days. May repeat cycle if needed. (Patient taking differently: Place 1 Application rectally 2 (two) times daily as needed for hemorrhoids or anal itching. For 10 days. May repeat cycle if needed.) 30 g 1   losartan (COZAAR) 100 MG tablet Take 1 tablet (100 mg total) by mouth daily. 90 tablet 0   Menthol, Topical Analgesic, (BLUE-EMU MAXIMUM STRENGTH EX) Apply 1 Application topically daily as needed (pain).     methotrexate (RHEUMATREX) 2.5 MG tablet Take 12.5 mg by mouth every Sunday. Caution:Chemotherapy. Protect from light. Take 5 tablets PO once weekly.     metoprolol tartrate (LOPRESSOR) 50 MG tablet TAKE 1 TABLET BY MOUTH TWICE  DAILY 200 tablet 2   omeprazole (PRILOSEC) 20 MG capsule Take 1 capsule (20 mg total) by mouth daily. 30 capsule 5   OVER THE COUNTER MEDICATION Take 2 tablets by mouth daily. Omega XL (not Fish oil)      predniSONE (DELTASONE) 1 MG tablet Take 1 mg by mouth daily with breakfast.     rosuvastatin (CRESTOR) 10 MG tablet TAKE 1 TABLET BY MOUTH DAILY 90 tablet 3   silodosin (RAPAFLO) 8 MG CAPS capsule Take 1 capsule (8 mg total) by mouth in the morning and at bedtime. (Patient taking differently: Take 8 mg by mouth at bedtime.) 200 capsule 2   triamcinolone cream (KENALOG) 0.1 % Apply 1 Application topically daily as needed (itching). 80 g 1   vitamin E 180 MG (400 UNITS) capsule Take 400 Units by mouth daily.     No current facility-administered medications for this visit.    Allergies:   Ciprofloxacin, Mosquito (diagnostic), and Oxycodone-acetaminophen    Social History:  The patient  reports that he has never smoked. He has never used smokeless tobacco. He reports that he does not  drink alcohol and does not use drugs.   Family History:  The patient's family history includes Heart Problems in his father.    ROS:  Please see the history of present illness.   Otherwise, review of systems are positive for none.   All other systems are reviewed and negative.    PHYSICAL EXAM: VS:  BP 110/70 (BP Location: Left Arm, Patient Position: Sitting, Cuff Size: Normal)   Pulse 79   Ht '5\' 9"'$  (1.753 m)   Wt 223 lb 8 oz (101.4 kg)   SpO2 95%   BMI 33.01 kg/m  , BMI Body mass index is 33.01 kg/m. GEN: Well nourished, well developed, in no acute distress  HEENT: normal  Neck: no JVD, carotid bruits, or masses Cardiac: RRR; no murmurs, rubs, or gallops, moderate left leg edema. Respiratory:  clear to auscultation bilaterally, normal work of breathing GI: soft, nontender, nondistended, + BS MS: no deformity or atrophy  Skin: warm and dry, no rash Neuro:  Strength and sensation are intact Psych: euthymic mood, full affect Vascular: Radial pulses normal bilaterally.  Femoral pulses normal bilaterally.  Distal pulses are not palpable on both sides.  Both feet are cold.  He has small callus on both  tips of second toes with significant tenderness.   EKG:  EKG is not ordered today.    Recent Labs: 11/24/2021: ALT 29; TSH 3.070 04/14/2022: Hemoglobin 12.2; Platelets 222 04/27/2022: BUN 15; Creatinine, Ser 1.37; Potassium 3.8; Sodium 136    Lipid Panel    Component Value Date/Time   CHOL 135 11/24/2021 1606   TRIG 152 (H) 11/24/2021 1606   HDL 40 11/24/2021 1606   CHOLHDL 3.4 11/24/2021 1606   CHOLHDL 2.9 Ratio 07/16/2007 2228   VLDL 20 07/16/2007 2228   LDLCALC 69 11/24/2021 1606      Wt Readings from Last 3 Encounters:  05/20/22 223 lb 8 oz (101.4 kg)  05/06/22 228 lb (103.4 kg)  04/28/22 221 lb 12.8 oz (100.6 kg)          07/08/2021   10:12 AM  PAD Screen  Previous PAD dx? No  Previous surgical procedure? No  Pain with walking? No  Feet/toe relief with dangling? No  Painful, non-healing ulcers? No  Extremities discolored? Yes      ASSESSMENT AND PLAN:  1.  Peripheral arterial disease with rest pain: Fortunately, he currently has no tissue loss.  His rest pain seems to be stable and he seems to be bothered mostly by lower extremity edema.  Will try to optimize the swelling and see how he feels before considering surgical repair colorization or left below the knee amputation.    2.  Hyperlipidemia: Continue treatment with rosuvastatin.  I reviewed most recent lipid profile which showed an LDL of 69.  3.  Essential hypertension: I switch amlodipine to hydrochlorothiazide due to lower extremity edema.  4.  Significant lower extremity edema left more than right: Amlodipine might be contributing.  I elected to switch amlodipine to hydrochlorothiazide 25 mg once daily.  Check basic metabolic profile in 1 week.  He also reports shortness of breath and thus we will obtain an echo cardiogram to evaluate his LV systolic and diastolic function.  5.  Recent episodes of exertional chest burning sensation: Symptoms are worrisome for angina and is at high risk for ischemic  heart disease.  I requested a Lexiscan Myoview.  He is not able to exercise on a treadmill.    Disposition:   Follow-up with  me after cardiac testing.  Signed,  Kathlyn Sacramento, MD  05/20/2022 12:13 PM    [facility]

## 2022-05-20 NOTE — Patient Instructions (Addendum)
Medication Instructions:  STOP the Amlodipine  START Hydrochlorothiazide 25 mg once daily  *If you need a refill on your cardiac medications before your next appointment, please call your pharmacy*   Lab Work: Your provider would like for you to return in one week to have the following labs drawn: BMET.  You may also go to any of these [facility] locations:   [place] ([facility]) - [address]  [place] - [address] - [address] [place] - [address] ([facility]   If you have labs (blood work) drawn today and your tests are completely normal, you will receive your results only by: [employer] (if you have MyChart) OR A paper copy in the mail If you have any lab test that is abnormal or we need to change your treatment, we will call you to review the results.   Testing/Procedures: Your physician has requested that you have an echocardiogram. Echocardiography is a painless test that uses sound waves to create images of your heart. It provides your doctor with information about the size and shape of your heart and how well your heart's chambers and valves are working. You may receive an ultrasound enhancing agent through an IV if needed to better visualize your heart during the echo.This procedure takes approximately one hour. There are no restrictions for this procedure.   Your physician has requested that you have a lexiscan myoview. For further information please visit [email or web address]. Please follow instruction sheet, as given.  Follow-Up: At [facility], you and your health needs are our priority.  As part of our continuing mission to provide you with exceptional heart care, we have created designated Provider Care Teams.  These Care Teams include your primary Cardiologist (physician) and Advanced Practice  Providers (APPs -  Physician Assistants and Nurse Practitioners) who all work together to provide you with the care you need, when you need it.  We recommend signing up for the patient portal called "MyChart".  Sign up information is provided on this After Visit Summary.  MyChart is used to connect with patients for Virtual Visits (Telemedicine).  Patients are able to view lab/test results, encounter notes, upcoming appointments, etc.  Non-urgent messages can be sent to your provider as well.   To learn more about what you can do with MyChart, go to [email or web address].    Your next appointment:   Follow up after testing

## 2022-05-24 ENCOUNTER — Encounter (HOSPITAL_COMMUNITY): Payer: Medicare Other

## 2022-05-26 ENCOUNTER — Ambulatory Visit (INDEPENDENT_AMBULATORY_CARE_PROVIDER_SITE_OTHER): Payer: 59 | Admitting: Podiatry

## 2022-05-26 DIAGNOSIS — L97522 Non-pressure chronic ulcer of other part of left foot with fat layer exposed: Secondary | ICD-10-CM

## 2022-05-26 DIAGNOSIS — E1142 Type 2 diabetes mellitus with diabetic polyneuropathy: Secondary | ICD-10-CM | POA: Diagnosis not present

## 2022-05-26 DIAGNOSIS — M2041 Other hammer toe(s) (acquired), right foot: Secondary | ICD-10-CM | POA: Diagnosis not present

## 2022-05-26 DIAGNOSIS — M2042 Other hammer toe(s) (acquired), left foot: Secondary | ICD-10-CM

## 2022-05-26 DIAGNOSIS — I999 Unspecified disorder of circulatory system: Secondary | ICD-10-CM

## 2022-05-26 NOTE — Progress Notes (Signed)
Subjective:  Patient ID: Jonathan [name], male    DOB: February 05, 1946,  MRN: [id number]  Chief Complaint  Patient presents with   Nail Problem    Wound check Patient states his pain is a 6/10. He states that he has no f/c/n/v. BS-did not check today    77 y.o. male presents follow-up of small ulceration interdigitally on the left fifth toe.  Reports that he is got a wound care appointment scheduled for 1 week from now.  Reports a history of DM 2.  Has been putting Betadine in the interdigital space between the fourth and fifth toe since last appointment has not been putting gauze in there.  Past Medical History:  Diagnosis Date   Arthritis    ra, sees dr Sedonia Small   Coronary artery disease    saw dr zachery in [place] 20 yrs ago, released from cardiology   Diabetes mellitus without complication ([place])    type 2   GERD (gastroesophageal reflux disease)    Hypercholesteremia    Hypertension    Urethral stricture     Allergies  Allergen Reactions   Ciprofloxacin Itching   Mosquito (Diagnostic) Itching   Oxycodone-Acetaminophen Rash    ROS: Negative except as per HPI above  Objective:  General: AAO x3, NAD  Dermatological: Attention to the medial aspect of the 5th toe left foot there is maceration and pinpoint open wound present to level of subcutaneous fat tissue. NO malodor drainage but maceration of the web space.  Slightly proved with decreased maceration prior no probe to bone or erythema or edema of the L 5th toe.   Vascular:  Dorsalis Pedis artery and Posterior Tibial artery pedal pulses are 2/4 bilateral.  Capillary fill time < 3 sec to all digits.   Neruologic: Grossly intact via light touch bilateral. Protective threshold intact to all sites bilateral.   Musculoskeletal: No gross boney pedal deformities bilateral. No pain, crepitus, or limitation noted with foot and ankle range of motion bilateral. Muscular strength 5/5 in all groups tested bilateral.  Gait:  Unassisted, Nonantalgic.   Radiographs:  Date: 05/05/22 XR both feet Weightbearing AP/Lateral/Oblique   Findings: digital contractures of the 2nd toe bilaterally Assessment:   1. Ulcer of left foot with fat layer exposed ([place])   2. Hammer toes of both feet   3. Vascular abnormality   4. DM type 2 with diabetic peripheral neuropathy ([place])       Plan:  Patient was evaluated and treated and all questions answered.  Ulcer medial aspect of the L 5th toe to subcutaneous fat tissue. -We discussed the etiology and factors that are a part of the wound healing process.  We also discussed the risk of infection both soft tissue and osteomyelitis from open ulceration.  Discussed the risk of limb loss if this happens or worsens. -No debridment indicated at this time. -Dressed with betadine gauze in between 4th and 5th toe -Continue home dressing changes daily with placing betadine gauze between 4th and 5th toe -Continue off-loading with gauze between toes. -Vascular testing previously ordered due to concern for diminished pedal pulses bilaterally with DM and ulceration but has not been completed -Last antibiotics: No abx currently indicated -Imaging: x-ray reviewed, shows no signs of erosions, osteolysis, osteomyelitis or emphysema. -Patient has wound care appointment set up for next week recommend he take this appointment and continue per their instructions with wound care     Return in about 3 weeks (around 06/16/2022) for F/u L 5th  toe ulcer.          Everitt Amber, DPM [facility]

## 2022-05-27 ENCOUNTER — Telehealth (HOSPITAL_COMMUNITY): Payer: Self-pay

## 2022-05-27 NOTE — Telephone Encounter (Signed)
After receiving order for patient to have Vascular Ultrasound, attempted contact on the dates below. I have been unable to reach patient for scheduling.   Dates of attempted contact:  05/27/22 vmb not set, unable to contact 05/19/22 vmb not set, unable to contact 05/06/22 vmb not set, unable to contact   Thank you for the opportunity to see this patient.

## 2022-05-31 ENCOUNTER — Encounter (HOSPITAL_COMMUNITY): Payer: Medicare Other | Admitting: Physical Therapy

## 2022-06-01 ENCOUNTER — Ambulatory Visit (INDEPENDENT_AMBULATORY_CARE_PROVIDER_SITE_OTHER): Payer: 59 | Admitting: Urology

## 2022-06-01 VITALS — BP 152/79 | HR 86

## 2022-06-01 DIAGNOSIS — R351 Nocturia: Secondary | ICD-10-CM | POA: Diagnosis not present

## 2022-06-01 DIAGNOSIS — Z87440 Personal history of urinary (tract) infections: Secondary | ICD-10-CM | POA: Diagnosis not present

## 2022-06-01 DIAGNOSIS — N3001 Acute cystitis with hematuria: Secondary | ICD-10-CM

## 2022-06-01 DIAGNOSIS — N400 Enlarged prostate without lower urinary tract symptoms: Secondary | ICD-10-CM

## 2022-06-01 LAB — BLADDER SCAN AMB NON-IMAGING: Scan Result: 200

## 2022-06-01 MED ORDER — FINASTERIDE 5 MG PO TABS: 5.0000 mg | ORAL_TABLET | Freq: Every day | ORAL | 2 refills | Status: DC

## 2022-06-01 MED ORDER — SILODOSIN 8 MG PO CAPS: 8.0000 mg | ORAL_CAPSULE | Freq: Two times a day (BID) | ORAL | 2 refills | Status: DC

## 2022-06-01 NOTE — Progress Notes (Signed)
post void residual= 200

## 2022-06-01 NOTE — Patient Instructions (Signed)

## 2022-06-01 NOTE — Progress Notes (Signed)
06/01/2022 3:28 PM   Jonathan [name] 1945-12-08 [id number]  Referring provider: [name], MD [address] [place],  [place] [address]  Followup BPH and frequent UTI   HPI: Jonathan [name] is a 77yo here for followup for BPH and acute cystitis. IPSS 6 QOL 1 on rapalfo '8mg'$  BID and finasteride. Urine stream strong No dysuria. Nocturia 1-2x. No urinary hestitancy. No UTI since last visit. UA today is normal   PMH: Past Medical History:  Diagnosis Date   Arthritis    ra, sees dr Sedonia Small   Coronary artery disease    saw dr zachery in [place] 20 yrs ago, released from cardiology   Diabetes mellitus without complication ([place])    type 2   GERD (gastroesophageal reflux disease)    Hypercholesteremia    Hypertension    Urethral stricture     Surgical History: Past Surgical History:  Procedure Laterality Date   ABDOMINAL AORTOGRAM W/LOWER EXTREMITY N/A 04/20/2022   Procedure: ABDOMINAL AORTOGRAM W/LOWER EXTREMITY;  Surgeon: Wellington Hampshire, MD;  Location: [facility];  Service: Cardiovascular;  Laterality: N/A;   BACK SURGERY     x 3 lower back   CARDIAC CATHETERIZATION  2000   small amt plaque relased from cardiology 20 yrs ago   CHOLECYSTECTOMY     COLONOSCOPY WITH PROPOFOL N/A 10/26/2021   Procedure: COLONOSCOPY WITH PROPOFOL;  Surgeon: Eloise Harman, DO;  [facility];  Service: Endoscopy;  Laterality: N/A;  To arrive at [place] N/A 04/17/2019   Procedure: CYSTOSCOPY WITH URETHRAL DILATATION, CYSTOGRAM;  Surgeon: Ceasar Mons, MD;  Location: [facility];  Service: Urology;  Laterality: N/A;   EYE SURGERY Bilateral    cataracts   IR RADIOLOGIST EVAL & MGMT  07/12/2021   PERIPHERAL VASCULAR BALLOON ANGIOPLASTY  04/20/2022   Procedure: PERIPHERAL VASCULAR BALLOON ANGIOPLASTY;  Surgeon: Wellington Hampshire, MD;  Location: [facility];  Service: Cardiovascular;;  aborted; unable  to cross   POLYPECTOMY  10/26/2021   Procedure: POLYPECTOMY INTESTINAL;  Surgeon: Eloise Harman, DO;  [facility]: [facility];  Service: Endoscopy;;   urethral stricture surgery  15 yrs ago    Home Medications:  Allergies as of 06/01/2022       Reactions   Ciprofloxacin Itching   Mosquito (diagnostic) Itching   Oxycodone-acetaminophen Rash        Medication List        Accurate as of June 01, 2022  3:28 PM. If you have any questions, ask your nurse or doctor.          albuterol 108 (90 Base) MCG/ACT inhaler Commonly known as: VENTOLIN HFA Inhale 1-2 puffs into the lungs every 6 (six) hours as needed for wheezing or shortness of breath.   aspirin EC 81 MG tablet Take 81 mg by mouth daily. Swallow whole.   BLUE-EMU MAXIMUM STRENGTH EX Apply 1 Application topically daily as needed (pain).   cholecalciferol 25 MCG (1000 UNIT) tablet Commonly known as: VITAMIN D3 Take 1,000 Units by mouth daily.   cyanocobalamin 1000 MCG tablet Commonly known as: VITAMIN B12 Take 1,000 mcg by mouth daily.   finasteride 5 MG tablet Commonly known as: PROSCAR Take 1 tablet (5 mg total) by mouth daily.   folic acid 1 MG tablet Commonly known as: FOLVITE Take 1 mg by mouth daily.   gabapentin 300 MG capsule Commonly known as: NEURONTIN Take 1  capsule (300 mg total) by mouth at bedtime.   glipiZIDE 5 MG tablet Commonly known as: GLUCOTROL Take 1 tablet (5 mg total) by mouth 2 (two) times daily before a meal. What changed: when to take this   hydrochlorothiazide 25 MG tablet Commonly known as: HYDRODIURIL Take 1 tablet (25 mg total) by mouth daily.   hydrocortisone 2.5 % rectal cream Commonly known as: ANUSOL-HC Place 1 Application rectally 2 (two) times daily. For 10 days. May repeat cycle if needed. What changed:  when to take this reasons to take this   losartan 100 MG tablet Commonly known as: COZAAR Take 1 tablet (100 mg total) by mouth daily.   methotrexate  2.5 MG tablet Commonly known as: RHEUMATREX Take 12.5 mg by mouth every Sunday. Caution:Chemotherapy. Protect from light. Take 5 tablets PO once weekly.   metoprolol tartrate 50 MG tablet Commonly known as: LOPRESSOR TAKE 1 TABLET BY MOUTH TWICE  DAILY   omeprazole 20 MG capsule Commonly known as: PRILOSEC Take 1 capsule (20 mg total) by mouth daily.   OVER THE COUNTER MEDICATION Take 2 tablets by mouth daily. Omega XL (not Fish oil)   predniSONE 1 MG tablet Commonly known as: DELTASONE Take 1 mg by mouth daily with breakfast.   rosuvastatin 10 MG tablet Commonly known as: CRESTOR TAKE 1 TABLET BY MOUTH DAILY   silodosin 8 MG Caps capsule Commonly known as: RAPAFLO Take 1 capsule (8 mg total) by mouth in the morning and at bedtime. What changed: when to take this   triamcinolone cream 0.1 % Commonly known as: KENALOG Apply 1 Application topically daily as needed (itching).   vitamin E 180 MG (400 UNITS) capsule Take 400 Units by mouth daily.        Allergies:  Allergies  Allergen Reactions   Ciprofloxacin Itching   Mosquito (Diagnostic) Itching   Oxycodone-Acetaminophen Rash    Family History: Family History  Problem Relation Age of Onset   Heart Problems Father     Social History:  reports that he has never smoked. He has never used smokeless tobacco. He reports that he does not drink alcohol and does not use drugs.  ROS: All other review of systems were reviewed and are negative except what is noted above in HPI  Physical Exam: BP (!) 152/79   Pulse 86   Constitutional:  Alert and oriented, No acute distress. HEENT: [place] AT, moist mucus membranes.  Trachea midline, no masses. Cardiovascular: No clubbing, cyanosis, or edema. Respiratory: Normal respiratory effort, no increased work of breathing. GI: Abdomen is soft, nontender, nondistended, no abdominal masses GU: No CVA tenderness.  Lymph: No cervical or inguinal lymphadenopathy. Skin: No rashes,  bruises or suspicious lesions. Neurologic: Grossly intact, no focal deficits, moving all 4 extremities. Psychiatric: Normal mood and affect.  Laboratory Data: Lab Results  Component Value Date   WBC 6.4 04/14/2022   HGB 12.2 (L) 04/14/2022   HCT 37.5 (L) 04/14/2022   MCV 93.1 04/14/2022   PLT 222 04/14/2022    Lab Results  Component Value Date   CREATININE 1.37 (H) 04/27/2022    No results found for: "PSA"  No results found for: "TESTOSTERONE"  Lab Results  Component Value Date   HGBA1C 6.4 (H) 03/28/2022    Urinalysis    Component Value Date/Time   COLORURINE yellow 04/16/2007 0000   APPEARANCEUR Clear 04/01/2022 1225   LABSPEC 1.020 04/16/2007 0000   PHURINE 5.5 04/16/2007 0000   GLUCOSEU Negative 04/01/2022 1225  HGBUR negative 04/16/2007 0000   BILIRUBINUR Negative 04/01/2022 1225   KETONESUR negative 12/28/2020 1532   PROTEINUR Trace 04/01/2022 1225   UROBILINOGEN 0.2 12/28/2020 1532   UROBILINOGEN negative 04/16/2007 0000   NITRITE Negative 04/01/2022 1225   NITRITE negative 04/16/2007 0000   LEUKOCYTESUR Trace (A) 04/01/2022 1225    Lab Results  Component Value Date   LABMICR See below: 04/01/2022   WBCUA 11-30 (A) 04/01/2022   LABEPIT 0-10 04/01/2022   MUCUS Present 05/12/2021   BACTERIA None seen 04/01/2022    Pertinent Imaging:  No results found for this or any previous visit.  No results found for this or any previous visit.  No results found for this or any previous visit.  No results found for this or any previous visit.  Results for orders placed during the hospital encounter of 12/17/20  US Renal  Narrative CLINICAL DATA:  Recurrent UTI in a 77 year old male.  EXAM: RENAL / URINARY TRACT ULTRASOUND COMPLETE  COMPARISON:  More remote studies dating back to 2005.  FINDINGS: Right Kidney:  Renal measurements: 9.5 x 4.0 x 5.1 cm = volume: 100.1 mL. Limited exam due to body habitus, no hydronephrosis. Mild  parenchymal thinning.  Left Kidney:  Renal measurements: 9.8 x 5.2 x 5.2 cm = volume: 138.8 mL. Limited exam due to overlying bowel gas and body habitus. No hydronephrosis. Mild parenchymal thinning. Small cyst arising from the lower pole measuring 1.2 x 1.0 x 1.0 cm.  Bladder:  Appears normal for degree of bladder distention.  Other:  Increased hepatic echogenicity.  IMPRESSION: Mild cortical thinning without signs of hydronephrosis. Limited exam due to body habitus and bowel gas.  Small LEFT lower pole cyst.  Hepatic steatosis.   Electronically Signed By: Zetta Bills M.D. On: 12/18/2020 14:12  No valid procedures specified. Results for orders placed during the hospital encounter of 03/24/21  CT HEMATURIA WORKUP  Narrative CLINICAL DATA:  Microhematuria  EXAM: CT ABDOMEN AND PELVIS WITHOUT AND WITH CONTRAST  TECHNIQUE: Multidetector CT imaging of the abdomen and pelvis was performed following the standard protocol before and following the bolus administration of intravenous contrast.  CONTRAST:  170m OMNIPAQUE IOHEXOL 350 MG/ML SOLN  COMPARISON:  None.  FINDINGS: Lower chest: No acute abnormality. Bandlike scarring of the bilateral lung bases.  Hepatobiliary: No focal liver abnormality is seen. Status post cholecystectomy. No biliary dilatation.  Pancreas: Unremarkable. No pancreatic ductal dilatation or surrounding inflammatory changes.  Spleen: Normal in size without significant abnormality.  Adrenals/Urinary Tract: Adrenal glands are unremarkable. Occasional small bilateral renal cysts. Kidneys are otherwise normal, without renal calculi, solid lesion, or hydronephrosis. No evidence of urinary tract filling defect on delayed phase imaging. Thickening of the urinary bladder wall. Diverticulum of the anterior bladder dome (series 7, image 74, series 12, image 69).  Stomach/Bowel: Stomach is within normal limits. Appendix appears normal. No  evidence of bowel wall thickening, distention, or inflammatory changes.  Vascular/Lymphatic: Aortic atherosclerosis. No enlarged abdominal or pelvic lymph nodes.  Reproductive: Prostatomegaly with median lobe hypertrophy.  Other: No abdominal wall hernia or abnormality. No abdominopelvic ascites.  Musculoskeletal: No acute or significant osseous findings.  IMPRESSION: 1. No evidence of urinary tract calculus, mass, or hydronephrosis. No evidence of urinary tract filling defect on delayed phase imaging. 2. Thickening of the urinary bladder wall, likely related to chronic outlet obstruction although infectious or inflammatory cystitis is a differential consideration in the setting of hematuria. 3. Prostatomegaly.  Aortic Atherosclerosis (ICD10-I70.0).   Electronically Signed By:  Delanna Ahmadi M.D. On: 03/26/2021 10:13  No results found for this or any previous visit.   Assessment & Plan:    1. Acute cystitis with hematuria -resolved - Urinalysis, Routine w reflex microscopic  2. Benign prostatic hyperplasia, unspecified whether lower urinary tract symptoms present -continue rapaflo '8mg'$  and finasteride '5mg'$  - BLADDER SCAN AMB NON-IMAGING  3. Nocturia continue rapaflo '8mg'$  and finasteride '5mg'$    No follow-ups on file.  Nicolette Bang, MD  [facility] [place]

## 2022-06-02 ENCOUNTER — Encounter (HOSPITAL_BASED_OUTPATIENT_CLINIC_OR_DEPARTMENT_OTHER): Payer: 59 | Attending: Internal Medicine | Admitting: Internal Medicine

## 2022-06-02 DIAGNOSIS — E1151 Type 2 diabetes mellitus with diabetic peripheral angiopathy without gangrene: Secondary | ICD-10-CM | POA: Diagnosis not present

## 2022-06-02 DIAGNOSIS — I70245 Atherosclerosis of native arteries of left leg with ulceration of other part of foot: Secondary | ICD-10-CM | POA: Diagnosis not present

## 2022-06-02 DIAGNOSIS — L97528 Non-pressure chronic ulcer of other part of left foot with other specified severity: Secondary | ICD-10-CM | POA: Diagnosis not present

## 2022-06-02 DIAGNOSIS — E11621 Type 2 diabetes mellitus with foot ulcer: Secondary | ICD-10-CM | POA: Diagnosis not present

## 2022-06-02 DIAGNOSIS — I129 Hypertensive chronic kidney disease with stage 1 through stage 4 chronic kidney disease, or unspecified chronic kidney disease: Secondary | ICD-10-CM | POA: Insufficient documentation

## 2022-06-02 DIAGNOSIS — M069 Rheumatoid arthritis, unspecified: Secondary | ICD-10-CM | POA: Insufficient documentation

## 2022-06-02 DIAGNOSIS — E1122 Type 2 diabetes mellitus with diabetic chronic kidney disease: Secondary | ICD-10-CM | POA: Diagnosis not present

## 2022-06-02 DIAGNOSIS — N183 Chronic kidney disease, stage 3 unspecified: Secondary | ICD-10-CM | POA: Diagnosis not present

## 2022-06-02 LAB — URINALYSIS, ROUTINE W REFLEX MICROSCOPIC
Bilirubin, UA: NEGATIVE
Glucose, UA: NEGATIVE
Ketones, UA: NEGATIVE
Leukocytes,UA: NEGATIVE
Nitrite, UA: NEGATIVE
Specific Gravity, UA: 1.02 (ref 1.005–1.030)
Urobilinogen, Ur: 0.2 mg/dL (ref 0.2–1.0)
pH, UA: 6 (ref 5.0–7.5)

## 2022-06-02 LAB — MICROSCOPIC EXAMINATION: Bacteria, UA: NONE SEEN

## 2022-06-03 ENCOUNTER — Encounter: Payer: Self-pay | Admitting: Urology

## 2022-06-03 NOTE — Progress Notes (Signed)
Weigelt, Leith L ([id number]) [id number] Page 1 of 7 Visit Report for 06/02/2022 Chief Complaint Document Details Patient Name: Date of Service: Jonathan [name], Jonathan L. 06/02/2022 9:30 A M Medical Record Number: [id number] Patient Account Number: [id number] Date of Birth/Sex: Treating RN: [birth year] (77 y.o. M) Primary Care Provider: [name] Other Clinician: Referring Provider: Treating Provider/Extender: [name], [name] in Treatment: 0 Information Obtained from: Patient Chief Complaint 06/02/2022; history of ulcer to the left foot fifth toe. Electronic Signature(s) Signed: 06/02/2022 1:45:23 PM By: Kalman Shan DO Entered By: Kalman Shan on 06/02/2022 11:06:50 -------------------------------------------------------------------------------- HPI Details Patient Name: Date of Service: Jonathan [name], Jonathan L. 06/02/2022 9:30 A M Medical Record Number: [id number] Patient Account Number: [id number] Date of Birth/Sex: Treating RN: 07-03-1945 (77 y.o. M) Primary Care Provider: [name] Other Clinician: Referring Provider: Treating Provider/Extender: [name], [name] in Treatment: 0 History of Present Illness HPI Description: 06/02/2022 Jonathan [name] is a 77 year old male with a past medical history of stage III kidney disease, rheumatoid arthritis, and peripheral arterial disease that presents to the clinic for history of wound to the left fifth toe that has been managed by podiatry. He has been using Betadine to the area. He keeps a gauze in between the fourth and fifth digit to allow for pressure relief. Today he has no open wound. He has a history of peripheral arterial disease. The left lower extremity has mild to moderate distal SFA stenosis with occluded anterior tibial artery, occluded peroneal artery and occluded posterior tibial arteries. He is being evaluated by vein and vascular for left femoral-popliteal  posterior tibial artery bypass on 2/16. Electronic Signature(s) Signed: 06/02/2022 1:45:23 PM By: Kalman Shan DO Entered By: Kalman Shan on 06/02/2022 11:16:46 -------------------------------------------------------------------------------- Physical Exam Details Patient Name: Date of Service: Jonathan [name], Jonathan L. 06/02/2022 9:30 A M Medical Record Number: [id number] Patient Account Number: [id number] Date of Birth/Sex: Treating RN: 02-Sep-1945 (77 y.o. M) Primary Care Provider: [name] Other Clinician: Pingleton, Kolin L ([id number]) [id number] Page 2 of 7 Referring Provider: Treating Provider/Extender: [name], [name] in Treatment: 0 Constitutional respirations regular, non-labored and within target range for patient.Marland Kitchen Psychiatric pleasant and cooperative. Notes Right foot: No open wound to the left fifth toe. Foot is warm but difficult to palpate pedal pulses. Electronic Signature(s) Signed: 06/02/2022 1:45:23 PM By: Kalman Shan DO Entered By: Kalman Shan on 06/02/2022 11:17:19 -------------------------------------------------------------------------------- Physician Orders Details Patient Name: Date of Service: Jonathan [name], Kenroy L. 06/02/2022 9:30 A M Medical Record Number: [id number] Patient Account Number: [id number] Date of Birth/Sex: Treating RN: Jan 11, 1946 (77 y.o. [name] Primary Care Provider: [name] Other Clinician: Referring Provider: Treating Provider/Extender: [name], [name] in Treatment: 0 Verbal / Phone Orders: No Diagnosis Coding ICD-10 Coding Code Description E11.621 Type 2 diabetes mellitus with foot ulcer I70.245 Atherosclerosis of native arteries of left leg with ulceration of other part of foot L97.528 Non-pressure chronic ulcer of other part of left foot with other specified severity Discharge From [facility] Services Discharge from [place] - Call if  any future wound care needs. continue to cushion between the toe to aid pressure between toes. use betadine x1 one more week. Continue to follow with podiatry Dr. Loel Lofty and Vein and Vascular Dr. Virl Cagey 06/17/2022 Electronic Signature(s) Signed: 06/02/2022 1:45:23 PM By: Kalman Shan DO Entered By: Kalman Shan on 06/02/2022 11:17:26 -------------------------------------------------------------------------------- Problem List Details Patient Name: Date of Service: Jonathan [name], Jonathan L. 06/02/2022 9:30 A M Medical Record Number:  [id number] Patient Account Number: [id number] Date of Birth/Sex: Treating RN: 1945-08-04 (77 y.o. M) Primary Care Provider: [name] Other Clinician: Referring Provider: Treating Provider/Extender: [name], [name] in Treatment: 0 Active Problems ICD-10 Encounter Pershing, [place] ([id number]) [id number] Page 3 of 7 Encounter Code Description Active Date MDM Diagnosis E11.628 Type 2 diabetes mellitus with other skin complications 11/02/812 No Yes I73.9 Peripheral vascular disease, unspecified 06/02/2022 No Yes Inactive Problems Resolved Problems Electronic Signature(s) Signed: 06/02/2022 1:45:23 PM By: Kalman Shan DO Entered By: Kalman Shan on 06/02/2022 11:06:09 -------------------------------------------------------------------------------- Progress Note Details Patient Name: Date of Service: Jonathan [name], Jonathan L. 06/02/2022 9:30 A M Medical Record Number: [id number] Patient Account Number: [id number] Date of Birth/Sex: Treating RN: Nov 17, 1945 (77 y.o. M) Primary Care Provider: [name] Other Clinician: Referring Provider: Treating Provider/Extender: [name], [name] in Treatment: 0 Subjective Chief Complaint Information obtained from Patient 06/02/2022; history of ulcer to the left foot fifth toe. History of Present Illness (HPI) 06/02/2022 Jonathan [name] is a  77 year old male with a past medical history of stage III kidney disease, rheumatoid arthritis, and peripheral arterial disease that presents to the clinic for history of wound to the left fifth toe that has been managed by podiatry. He has been using Betadine to the area. He keeps a gauze in between the fourth and fifth digit to allow for pressure relief. Today he has no open wound. He has a history of peripheral arterial disease. The left lower extremity has mild to moderate distal SFA stenosis with occluded anterior tibial artery, occluded peroneal artery and occluded posterior tibial arteries. He is being evaluated by vein and vascular for left femoral-popliteal posterior tibial artery bypass on 2/16. Patient History Information obtained from Patient, Chart. Allergies mosquito allergenic extract, ciprofloxacin, oxycodone-acetaminophen Family History Unknown History. Social History Never smoker, Marital Status - Married, Alcohol Use - Never, Drug Use - No History, Caffeine Use - Rarely. Medical History Eyes Patient has history of Glaucoma - removed bilateral Cardiovascular Patient has history of Coronary Artery Disease, Hypertension, Peripheral Arterial Disease Endocrine Patient has history of Type II Diabetes Musculoskeletal Patient has history of Osteoarthritis Patient is treated with Oral Agents. Blood sugar is not tested. Hospitalization/Surgery History - abdominal aortogram w/ lower extremity 04/20/22. - peripheral vascular balloon angioplasty. - polypectomy. - cardiac cath. - urethral stricture surgery. - x3 back surgeries. Medical A Surgical History Notes nd Cardiovascular hypercholesteremia Jepsen, Rogerio L ([id number]) [id number] Page 4 of 7 Gastrointestinal GERd Review of Systems (ROS) Constitutional Symptoms (General Health) Denies complaints or symptoms of Fatigue, Fever, Chills, Marked Weight Change. Eyes Denies complaints or symptoms  of Dry Eyes, Vision Changes, Glasses / Contacts. Ear/Nose/Mouth/Throat Denies complaints or symptoms of Chronic sinus problems or rhinitis. Respiratory Denies complaints or symptoms of Chronic or frequent coughs, Shortness of Breath. Genitourinary urethral stricture Integumentary (Skin) Complains or has symptoms of Wounds - left foot. Neurologic Denies complaints or symptoms of Numbness/parasthesias. Psychiatric Denies complaints or symptoms of Claustrophobia. Objective Constitutional respirations regular, non-labored and within target range for patient.. Vitals Time Taken: 9:43 AM, Height: 69 in, Source: Stated, Weight: 227 lbs, Source: Stated, BMI: 33.5, Temperature: 98.5 F, Pulse: 68 bpm, Respiratory Rate: 16 breaths/min, Blood Pressure: 147/72 mmHg. General Notes: checks blood glucose once a week. per patient was told to check daily. Psychiatric pleasant and cooperative. General Notes: Right foot: No open wound to the left fifth toe. Foot is warm but difficult to palpate pedal pulses. Assessment Active Problems ICD-10 Type 2 diabetes mellitus  with other skin complications Peripheral vascular disease, unspecified Patient has a history of right fifth toe wound likely caused by pressure between the fourth and fifth digit. I recommended continuing to place gauze in between this to help with this. He has follow-up with podiatry later in the month. I recommended he keep this appointment. Plan Discharge From [facility] Services: Discharge from [place] - Call if any future wound care needs. continue to cushion between the toe to aid pressure between toes. use betadine x1 one more week. Continue to follow with podiatry Dr. Loel Lofty and Vein and Vascular Dr. Virl Cagey 06/17/2022 1. Follow-up as needed 2. Follow-up with podiatry Electronic Signature(s) Signed: 06/02/2022 1:45:23 PM By: Kalman Shan DO Entered By: Kalman Shan on 06/02/2022 11:20:57 Cedotal, Tydus L  ([id number]) [id number] Page 5 of 7 -------------------------------------------------------------------------------- HxROS Details Patient Name: Date of Service: Jonathan [name], Ladarius L. 06/02/2022 9:30 A M Medical Record Number: [id number] Patient Account Number: [id number] Date of Birth/Sex: Treating RN: [birth year] (77 y.o. Burnadette Pop, Lauren Primary Care Provider: [name] Other Clinician: Referring Provider: Treating Provider/Extender: [name], [name] in Treatment: 0 Information Obtained From Patient Chart Constitutional Symptoms (General Health) Complaints and Symptoms: Negative for: Fatigue; Fever; Chills; Marked Weight Change Eyes Complaints and Symptoms: Negative for: Dry Eyes; Vision Changes; Glasses / Contacts Medical History: Positive for: Glaucoma - removed bilateral Ear/Nose/Mouth/Throat Complaints and Symptoms: Negative for: Chronic sinus problems or rhinitis Respiratory Complaints and Symptoms: Negative for: Chronic or frequent coughs; Shortness of Breath Integumentary (Skin) Complaints and Symptoms: Positive for: Wounds - left foot Neurologic Complaints and Symptoms: Negative for: Numbness/parasthesias Psychiatric Complaints and Symptoms: Negative for: Claustrophobia Hematologic/Lymphatic Cardiovascular Medical History: Positive for: Coronary Artery Disease; Hypertension; Peripheral Arterial Disease Past Medical History Notes: hypercholesteremia Gastrointestinal Medical History: Past Medical History Notes: GERd Endocrine Medical History: Positive for: Type II Diabetes Time with diabetes: 10 years Treated with: Oral agents Blood sugar tested every day: No Timberlake, Madox L ([id number]) [id number] Page 6 of 7 Genitourinary Complaints and Symptoms: Review of System Notes: urethral stricture Immunological Musculoskeletal Medical History: Positive for:  Osteoarthritis Oncologic HBO Extended History Items Eyes: Glaucoma Immunizations Pneumococcal Vaccine: Received Pneumococcal Vaccination: No Implantable Devices None Hospitalization / Surgery History Type of Hospitalization/Surgery abdominal aortogram w/ lower extremity 04/20/22 peripheral vascular balloon angioplasty polypectomy cardiac cath urethral stricture surgery x3 back surgeries Family and Social History Unknown History: Yes; Never smoker; Marital Status - Married; Alcohol Use: Never; Drug Use: No History; Caffeine Use: Rarely; Financial Concerns: No; Food, Clothing or Shelter Needs: No; Support System Lacking: No; Transportation Concerns: No Electronic Signature(s) Signed: 06/02/2022 1:45:23 PM By: Kalman Shan DO Signed: 06/02/2022 5:30:52 PM By: Deon Pilling RN, BSN Signed: 06/03/2022 8:21:34 AM By: Rhae Hammock RN Entered By: Deon Pilling on 06/02/2022 09:49:48 -------------------------------------------------------------------------------- SuperBill Details Patient Name: Date of Service: Jonathan [name], Mantaj L. 06/02/2022 Medical Record Number: [id number] Patient Account Number: [id number] Date of Birth/Sex: Treating RN: October 14, 1945 (77 y.o. Lorette Ang, Tammi Klippel Primary Care Provider: [name] Other Clinician: Referring Provider: Treating Provider/Extender: [name], [name] in Treatment: 0 Diagnosis Coding ICD-10 Codes Code Description [phone] Type 2 diabetes mellitus with foot ulcer I70.245 Atherosclerosis of native arteries of left leg with ulceration of other part of foot L97.528 Non-pressure chronic ulcer of other part of left foot with other specified severity [place], Lindel L ([id number]) [id number] Page 7 of 7 Facility Procedures : CPT4 Code: [id number] Description: 99213 - WOUND CARE VISIT-LEV 3 EST PT Modifier: Quantity: 1 Physician Procedures : UKG2  Code Description Modifier [id number] 99213 - WC PHYS  LEVEL 3 - EST PT ICD-10 Diagnosis Description E11.621 Type 2 diabetes mellitus with foot ulcer I70.245 Atherosclerosis of native arteries of left leg with ulceration of other part of foot L97.528  Non-pressure chronic ulcer of other part of left foot with other specified severity Quantity: 1 Electronic Signature(s) Signed: 06/02/2022 1:45:23 PM By: Kalman Shan DO Entered By: Kalman Shan on 06/02/2022 11:21:11

## 2022-06-03 NOTE — Progress Notes (Signed)
Jonathan [name] ([id number]) [id number] Page 1 of 7 Visit Report for 06/02/2022 Allergy List Details Patient Name: Date of Service: Jonathan [name], Jonathan [name]. 06/02/2022 9:30 A M Medical Record Number: [id number] Patient Account Number: [id number] Date of Birth/Sex: Treating RN: Nov 11, 1945 (77 y.o. Burnadette Pop, Lauren Primary Care Bette Brienza: [name] Other Clinician: Referring Manila Rommel: Treating Maveryk Renstrom/Extender: [name], [name] in Treatment: 0 Allergies Active Allergies mosquito allergenic extract ciprofloxacin oxycodone-acetaminophen Type: Medication Allergy Notes Electronic Signature(s) Signed: 06/03/2022 8:21:34 AM By: Rhae Hammock RN Entered By: Rhae Hammock on 06/01/2022 14:59:45 -------------------------------------------------------------------------------- Arrival Information Details Patient Name: Date of Service: Jonathan [name], Robbert [name]. 06/02/2022 9:30 A M Medical Record Number: [id number] Patient Account Number: [id number] Date of Birth/Sex: Treating RN: 1946/04/14 (77 y.o. Jonathan [name] Primary Care Rutger Salton: [name] Other Clinician: Referring Zeferino Mounts: Treating Zonnique Norkus/Extender: [name] in Treatment: 0 Visit Information Patient Arrived: Lyndel Pleasure Time: 09:37 Accompanied By: wife Transfer Assistance: None Patient Identification Verified: Yes Secondary Verification Process Completed: Yes Patient Requires Transmission-Based Precautions: No Patient Has Alerts: No Electronic Signature(s) Signed: 06/02/2022 5:30:52 PM By: Jonathan Pilling RN, BSN Entered By: Jonathan [name] on 06/02/2022 09:40:50 Glomski, Nicholai [name] ([id number]) [id number] Page 2 of 7 -------------------------------------------------------------------------------- Clinic Level of Care Assessment Details Patient Name: Date of Service: Jonathan [name], Mendel [name]. 06/02/2022 9:30 A M Medical Record Number:  [id number] Patient Account Number: [id number] Date of Birth/Sex: Treating RN: 12-04-1945 (77 y.o. Jonathan [name] Primary Care  Grosser: [name] Other Clinician: Referring Baldemar Dady: Treating Azalyn Sliwa/Extender: [name], [name] in Treatment: 0 Clinic Level of Care Assessment Items TOOL 2 Quantity Score X- 1 0 Use when only an EandM is performed on the INITIAL visit ASSESSMENTS - Nursing Assessment / Reassessment X- 1 20 General Physical Exam (combine w/ comprehensive assessment (listed just below) when performed on new pt. evals) X- 1 25 Comprehensive Assessment (HX, ROS, Risk Assessments, Wounds Hx, etc.) ASSESSMENTS - Wound and Skin A ssessment / Reassessment '[]'$  - 0 Simple Wound Assessment / Reassessment - one wound '[]'$  - 0 Complex Wound Assessment / Reassessment - multiple wounds X- 1 10 Dermatologic / Skin Assessment (not related to wound area) ASSESSMENTS - Ostomy and/or Continence Assessment and Care '[]'$  - 0 Incontinence Assessment and Management '[]'$  - 0 Ostomy Care Assessment and Management (repouching, etc.) PROCESS - Coordination of Care X - Simple Patient / Family Education for ongoing care 1 15 '[]'$  - 0 Complex (extensive) Patient / Family Education for ongoing care X- 1 10 Staff obtains Programmer, systems, Records, T Results / Process Orders est '[]'$  - 0 Staff telephones HHA, Nursing Homes / Clarify orders / etc '[]'$  - 0 Routine Transfer to another Facility (non-emergent condition) '[]'$  - 0 Routine Hospital Admission (non-emergent condition) '[]'$  - 0 New Admissions / Biomedical engineer / Ordering NPWT Apligraf, etc. , '[]'$  - 0 Emergency Hospital Admission (emergent condition) X- 1 10 Simple Discharge Coordination '[]'$  - 0 Complex (extensive) Discharge Coordination PROCESS - Special Needs '[]'$  - 0 Pediatric / Minor Patient Management '[]'$  - 0 Isolation Patient Management '[]'$  - 0 Hearing / Language / Visual special needs '[]'$  - 0 Assessment of Community  assistance (transportation, D/C planning, etc.) '[]'$  - 0 Additional assistance / Altered mentation '[]'$  - 0 Support Surface(s) Assessment (bed, cushion, seat, etc.) INTERVENTIONS - Wound Cleansing / Measurement '[]'$  - 0 Wound Imaging (photographs - any number of wounds) '[]'$  - 0 Wound Tracing (instead of photographs) '[]'$  - 0 Simple Wound Measurement - one wound '[]'$  -  0 Complex Wound Measurement - multiple wounds '[]'$  - 0 Simple Wound Cleansing - one wound Wachter, Haaris [name] ([id number]) [id number] Page 3 of 7 '[]'$  - 0 Complex Wound Cleansing - multiple wounds INTERVENTIONS - Wound Dressings '[]'$  - 0 Small Wound Dressing one or multiple wounds '[]'$  - 0 Medium Wound Dressing one or multiple wounds '[]'$  - 0 Large Wound Dressing one or multiple wounds '[]'$  - 0 Application of Medications - injection INTERVENTIONS - Miscellaneous '[]'$  - 0 External ear exam '[]'$  - 0 Specimen Collection (cultures, biopsies, blood, body fluids, etc.) '[]'$  - 0 Specimen(s) / Culture(s) sent or taken to Lab for analysis '[]'$  - 0 Patient Transfer (multiple staff / Harrel Lemon Lift / Similar devices) '[]'$  - 0 Simple Staple / Suture removal (25 or less) '[]'$  - 0 Complex Staple / Suture removal (26 or more) '[]'$  - 0 Hypo / Hyperglycemic Management (close monitor of Blood Glucose) X- 1 15 Ankle / Brachial Index (ABI) - do not check if billed separately Has the patient been seen at the hospital within the last three years: Yes Total Score: 105 Level Of Care: New/Established - Level 3 Electronic Signature(s) Signed: 06/02/2022 5:30:52 PM By: Jonathan Pilling RN, BSN Entered By: Jonathan [name] on 06/02/2022 10:35:01 -------------------------------------------------------------------------------- Encounter Discharge Information Details Patient Name: Date of Service: Jonathan [name], Jonathan [name]. 06/02/2022 9:30 A M Medical Record Number: [id number] Patient Account Number: [id number] Date of Birth/Sex: Treating RN: 11-30-1945 (77 y.o.  Jonathan [name] Primary Care Jamarie Mussa: [name] Other Clinician: Referring Katelind Pytel: Treating Masen Salvas/Extender: [name] in Treatment: 0 Encounter Discharge Information Items Discharge Condition: Stable Ambulatory Status: Cane Discharge Destination: Home Transportation: Private Auto Accompanied By: wife Schedule Follow-up Appointment: No Clinical Summary of Care: Notes betadine and gauze between toes. Electronic Signature(s) Signed: 06/02/2022 5:30:52 PM By: Jonathan Pilling RN, BSN Entered By: Jonathan [name] on 06/02/2022 10:35:33 Dani, Rishawn [name] ([id number]) [id number] Page 4 of 7 -------------------------------------------------------------------------------- Lower Extremity Assessment Details Patient Name: Date of Service: Jonathan [name], Kendyn [name]. 06/02/2022 9:30 A M Medical Record Number: [id number] Patient Account Number: [id number] Date of Birth/Sex: Treating RN: June 03, 1945 (77 y.o. Lorette Ang, Meta.Reding Primary Care Verlon Pischke: [name] Other Clinician: Referring Camaron Cammack: Treating Aune Adami/Extender: [name], [name] in Treatment: 0 Edema Assessment Assessed: [Left: Yes] [Right: No] Edema: [Left: Ye] [Right: s] Calf Left: Right: Point of Measurement: 33 cm From Medial Instep 34 cm Ankle Left: Right: Point of Measurement: 9 cm From Medial Instep 24 cm Knee To Floor Left: Right: From Medial Instep 45 cm Vascular Assessment Pulses: Dorsalis Pedis Palpable: [Left:No] Doppler Audible: [Left:Inaudible] Posterior Tibial Palpable: [Left:Yes] Doppler Audible: [Left:Yes] Blood Pressure: Brachial: [Left:147] Ankle: [Left:Posterior Tibial: 70 0.48] Electronic Signature(s) Signed: 06/02/2022 5:30:52 PM By: Jonathan Pilling RN, BSN Entered By: Jonathan [name] on 06/02/2022 09:58:49 -------------------------------------------------------------------------------- Multi Wound Chart Details Patient Name: Date  of Service: Jonathan [name], Issam [name]. 06/02/2022 9:30 A M Medical Record Number: [id number] Patient Account Number: [id number] Date of Birth/Sex: Treating RN: 12/07/1945 (77 y.o. M) Primary Care Finneas Mathe: [name] Other Clinician: Referring Omah Dewalt: Treating Mirabelle Cyphers/Extender: [name], [name] in Treatment: 0 Vital Signs Height(in): 69 Pulse(bpm): 68 Weight(lbs): 227 Blood Pressure(mmHg): 147/72 Body Mass Index(BMI): 33.5 Temperature(F): 98.5 Respiratory Rate(breaths/min): 16 Dirr, Terryl [name] ([id number]) [Treatment Notes:Electronic Signature(s) Signed: 06/02/2022 1:45:23 PM By: Kalman Shan DO] -------------------------------------------------------------------------------- Multi-Disciplinary Care Plan Details Patient Name: Date of Service: Jonathan [name], Asani [name]. 06/02/2022 9:30 A M Medical Record Number: [id number] Patient Account Number: [id number] Date of Birth/Sex: Treating RN: 22-Oct-1945 (  77 y.o. Jonathan [name] Primary Care Eun Vermeer: [name] Other Clinician: Referring Acea Yagi: Treating Keerstin Bjelland/Extender: [name], [name] in Treatment: 0 Active Inactive Electronic Signature(s) Signed: 06/02/2022 5:30:52 PM By: Jonathan Pilling RN, BSN Entered By: Jonathan [name] on 06/02/2022 10:34:07 -------------------------------------------------------------------------------- Pain Assessment Details Patient Name: Date of Service: Jonathan [name], Rhyatt [name]. 06/02/2022 9:30 A M Medical Record Number: [id number] Patient Account Number: [id number] Date of Birth/Sex: Treating RN: 10-Jul-1945 (77 y.o. Jonathan [name] Primary Care Lindamarie Maclachlan: [name] Other Clinician: Referring Lewi Drost: Treating Bricelyn Freestone/Extender: [name], [name] in Treatment: 0 Active Problems Location of Pain Severity and Description of Pain Patient Has Paino Yes Site Locations Pain Location: Pain in Ulcers Rate the pain. Current Pain Level: 3 Pain Management  and Medication Current Pain Management: Medication: No Cold Application: No Rest: No Massage: No Activity: No T.E.N.S.: No Reckner, Ashraf [name] ([id number]) [id number] Page 6 of 7 Heat Application: No Leg drop or elevation: No Is the Current Pain Management Adequate: Adequate How does your wound impact your activities of daily livingo Sleep: No Bathing: No Appetite: No Relationship With Others: No Bladder Continence: No Emotions: No Bowel Continence: No Work: No Toileting: No Drive: No Dressing: No Hobbies: No Engineer, maintenance) Signed: 06/02/2022 5:30:52 PM By: Jonathan Pilling RN, BSN Entered By: Jonathan [name] on 06/02/2022 09:44:46 -------------------------------------------------------------------------------- Patient/Caregiver Education Details Patient Name: Date of Service: Jonathan [name], Kable [name]. 2/1/2024andnbsp9:30 A M Medical Record Number: [id number] Patient Account Number: [id number] Date of Birth/Gender: Treating RN: 1946-01-24 (77 y.o. Jonathan [name] Primary Care Physician: [name] Other Clinician: Referring Physician: Treating Physician/Extender: [name] in Treatment: 0 Education Assessment Education Provided To: Patient Education Topics Provided Tissue Oxygenation: Handouts: Peripheral Arterial Disease and Related Ulcers Methods: Explain/Verbal Responses: Reinforcements needed Electronic Signature(s) Signed: 06/02/2022 5:30:52 PM By: Jonathan Pilling RN, BSN Entered By: Jonathan [name] on 06/02/2022 10:34:21 -------------------------------------------------------------------------------- Vitals Details Patient Name: Date of Service: Jonathan [name], Travez [name]. 06/02/2022 9:30 A M Medical Record Number: [id number] Patient Account Number: [id number] Date of Birth/Sex: Treating RN: Jun 02, 1945 (77 y.o. Lorette Ang, Meta.Reding Primary Care Saveah Bahar: [name] Other Clinician: Referring Ostin Mathey: Treating  Ciclaly Mulcahey/Extender: [name], [name] in Treatment: 0 Vital Signs Time Taken: 09:43 Temperature (F): 98.5 Height (in): 69 Pulse (bpm): 68 Source: Stated Respiratory Rate (breaths/min): 16 Weight (lbs): 227 Blood Pressure (mmHg): 147/72 Source: Stated Reference Range: 80 - 120 mg / dl Muhs, Tadarrius [name] ([id number]) [id number] Page 7 of 7 Body Mass Index (BMI): 33.5 Notes checks blood glucose once a week. per patient was told to check daily. Electronic Signature(s) Signed: 06/02/2022 5:30:52 PM By: Jonathan Pilling RN, BSN Entered By: Jonathan [name] on 06/02/2022 09:47:22

## 2022-06-03 NOTE — Progress Notes (Signed)
Jonathan [name] ([id number]) [id number] Page 1 of 4 Visit Report for 06/02/2022 Abuse Risk Screen Details Patient Name: Date of Service: Jonathan [name], Jonathan [name]. 06/02/2022 9:30 A M Medical Record Number: [id number] Patient Account Number: [id number] Date of Birth/Sex: Treating RN: May 25, 1945 (77 y.o. Jonathan [name] Primary Care Jonathan [name]: Jonathan [name] Other Clinician: Referring Jonathan [name]: Treating Jonathan [name]/Extender: Jonathan [name], Jonathan [name] in Treatment: 0 Abuse Risk Screen Items Answer ABUSE RISK SCREEN: Has anyone close to you tried to hurt or harm you recentlyo No Do you feel uncomfortable with anyone in your familyo No Has anyone forced you do things that you didnt want to doo No Electronic Signature(s) Signed: 06/02/2022 5:30:52 PM By: Jonathan Pilling RN, BSN Entered By: Jonathan [name] on 06/02/2022 09:43:36 -------------------------------------------------------------------------------- Activities of Daily Living Details Patient Name: Date of Service: Jonathan [name], Jonathan [name]. 06/02/2022 9:30 A M Medical Record Number: [id number] Patient Account Number: [id number] Date of Birth/Sex: Treating RN: 08-19-1945 (76 y.o. Jonathan [name] Primary Care Jonathan [name]: Jonathan [name] Other Clinician: Referring Jonathan [name]: Treating Jonathan [name]/Extender: Jonathan [name], Jonathan [name] in Treatment: 0 Activities of Daily Living Items Answer Activities of Daily Living (Please select one for each item) Drive Automobile Completely Able T Medications ake Completely Able Use T elephone Completely Able Care for Appearance Completely Able Use T oilet Completely Able Bath / Shower Completely Able Dress Self Completely Able Feed Self Completely Able Walk Completely Able Get In / Out Bed Completely Able Housework Completely Able Prepare Meals Completely [place] Completely Able Shop for Self Completely Able Electronic Signature(s) Signed: 06/02/2022 5:30:52  PM By: Jonathan Pilling RN, BSN Entered By: Jonathan [name] on 06/02/2022 09:43:52 Biegler, Jonathan [name] ([id number]) [id number] Page 2 of 4 -------------------------------------------------------------------------------- Education Screening Details Patient Name: Date of Service: Jonathan [name], Jonathan [name]. 06/02/2022 9:30 A M Medical Record Number: [id number] Patient Account Number: [id number] Date of Birth/Sex: Treating RN: [birth year] (77 y.o. Jonathan [name] Primary Care Jonathan [name]: Jonathan [name] Other Clinician: Referring Jonathan [name]: Treating Jonathan [name]/Extender: Jonathan [name] in Treatment: 0 Primary Learner Assessed: Patient Learning Preferences/Education Level/Primary Language Learning Preference: Explanation, Demonstration, Printed Material Highest Education Level: Grade School Preferred Language: English Cognitive Barrier Language Barrier: No Translator Needed: No Memory Deficit: No Emotional Barrier: No Cultural/Religious Beliefs Affecting Medical Care: No Physical Barrier Impaired Vision: No Impaired Hearing: No Decreased Hand dexterity: No Knowledge/Comprehension Knowledge Level: High Comprehension Level: High Ability to understand written instructions: High Ability to understand verbal instructions: High Motivation Anxiety Level: Calm Cooperation: Cooperative Education Importance: Acknowledges Need Interest in Health Problems: Asks Questions Perception: Coherent Willingness to Engage in Self-Management High Activities: Readiness to Engage in Self-Management High Activities: Electronic Signature(s) Signed: 06/02/2022 5:30:52 PM By: Jonathan Pilling RN, BSN Entered By: Jonathan [name] on 06/02/2022 09:44:09 -------------------------------------------------------------------------------- Fall Risk Assessment Details Patient Name: Date of Service: Jonathan [name], Jonathan [name]. 06/02/2022 9:30 A M Medical Record Number: [id number] Patient Account  Number: [id number] Date of Birth/Sex: Treating RN: 12-05-1945 (77 y.o. Jonathan [name] Primary Care Jonathan [name]: Jonathan [name] Other Clinician: Referring Jonathan [name]: Treating Jonathan [name]/Extender: Jonathan [name], Jonathan [name] in Treatment: 0 Fall Risk Assessment Items Have you had 2 or more falls in the last 12 monthso 0 Yes Jonathan [name], Jonathan [name] ([id number]) [id number] Page 3 of 4 Have you had any fall that resulted in injury in the last 12 monthso 0 No FALLS RISK SCREEN History of falling - immediate or within 3 months 25 Yes Secondary diagnosis (Do you have 2 or more medical diagnoseso) 0  No Ambulatory aid None/bed rest/wheelchair/nurse 0 No Crutches/cane/walker 15 Yes Furniture 0 No Intravenous therapy Access/Saline/Heparin Lock 0 No Gait/Transferring Normal/ bed rest/ wheelchair 0 No Weak (short steps with or without shuffle, stooped but able to lift head while walking, may seek 10 Yes support from furniture) Impaired (short steps with shuffle, may have difficulty arising from chair, head down, impaired 0 No balance) Mental Status Oriented to own ability 0 Yes Electronic Signature(s) Signed: 06/02/2022 5:30:52 PM By: Jonathan Pilling RN, BSN Entered By: Jonathan [name] on 06/02/2022 09:44:18 -------------------------------------------------------------------------------- Foot Assessment Details Patient Name: Date of Service: Jonathan [name], Jonathan [name]. 06/02/2022 9:30 A M Medical Record Number: [id number] Patient Account Number: [id number] Date of Birth/Sex: Treating RN: 1946-03-21 (77 y.o. Jonathan [name] Primary Care Jonathan [name]: Jonathan [name] Other Clinician: Referring Jonathan [name]: Treating Jonathan [name]/Extender: Jonathan [name], Jonathan [name] in Treatment: 0 Foot Assessment Items Site Locations + = Sensation present, - = Sensation absent, C = Callus, U = Ulcer R = Redness, W = Warmth, M = Maceration, PU = Pre-ulcerative lesion F = Fissure, S = Swelling, D  = Dryness Assessment Right: Left: Other Deformity: No No Prior Foot Ulcer: No No Prior Amputation: No No Charcot Joint: No No Ambulatory Status: Ambulatory With Help Assistance Device: [place], Kedarius [name] ([id number]) [id number] Page 4 of 4 Gait: Steady Electronic Signature(s) Signed: 06/02/2022 5:30:52 PM By: Jonathan Pilling RN, BSN Entered By: Jonathan [name] on 06/02/2022 09:57:16 -------------------------------------------------------------------------------- Nutrition Risk Screening Details Patient Name: Date of Service: Jonathan [name], Jonathan [name]. 06/02/2022 9:30 A M Medical Record Number: [id number] Patient Account Number: [id number] Date of Birth/Sex: Treating RN: 05-01-1946 (77 y.o. Jonathan [name] Primary Care Reggie Welge: Jonathan [name] Other Clinician: Referring Yosselyn Tax: Treating Endrit Gittins/Extender: Jonathan [name], Jonathan [name] in Treatment: 0 Height (in): Weight (lbs): Body Mass Index (BMI): Nutrition Risk Screening Items Score Screening NUTRITION RISK SCREEN: I have an illness or condition that made me change the kind and/or amount of food I eat 2 Yes I eat fewer than two meals per day 0 No I eat few fruits and vegetables, or milk products 0 No I have three or more drinks of beer, liquor or wine almost every day 0 No I have tooth or mouth problems that make it hard for me to eat 0 No I don't always have enough money to buy the food I need 0 No I eat alone most of the time 0 No I take three or more different prescribed or over-the-counter drugs a day 1 Yes Without wanting to, I have lost or gained 10 pounds in the last six months 0 No I am not always physically able to shop, cook and/or feed myself 0 No Nutrition Protocols Good Risk Protocol Provide education on elevated blood Moderate Risk Protocol 0 sugars and impact on wound healing, as applicable High Risk Proctocol Risk Level: Moderate Risk Score: 3 Electronic  Signature(s) Signed: 06/02/2022 5:30:52 PM By: Jonathan Pilling RN, BSN Entered By: Jonathan [name] on 06/02/2022 09:44:25

## 2022-06-08 ENCOUNTER — Telehealth: Payer: Self-pay | Admitting: Internal Medicine

## 2022-06-08 ENCOUNTER — Other Ambulatory Visit: Payer: Self-pay

## 2022-06-08 ENCOUNTER — Telehealth (HOSPITAL_COMMUNITY): Payer: Self-pay | Admitting: *Deleted

## 2022-06-08 MED ORDER — GLIPIZIDE 5 MG PO TABS: 5.0000 mg | ORAL_TABLET | Freq: Two times a day (BID) | ORAL | 0 refills | Status: DC

## 2022-06-08 NOTE — Telephone Encounter (Signed)
Pt reached and given instructions for MPI study scheduled on 06/10/22.

## 2022-06-08 NOTE — Telephone Encounter (Signed)
Patient needs refill on glipiZIDE (GLUCOTROL) 5 MG tablet   Patient is completley out of med , has not received refills from [place] [facility]  Is requesting refill to [place]

## 2022-06-08 NOTE — Telephone Encounter (Signed)
Refills sent to pharmacy. 

## 2022-06-10 ENCOUNTER — Ambulatory Visit (HOSPITAL_COMMUNITY): Payer: 59 | Attending: Cardiology

## 2022-06-10 ENCOUNTER — Ambulatory Visit (HOSPITAL_BASED_OUTPATIENT_CLINIC_OR_DEPARTMENT_OTHER): Payer: 59

## 2022-06-10 DIAGNOSIS — R079 Chest pain, unspecified: Secondary | ICD-10-CM | POA: Insufficient documentation

## 2022-06-10 DIAGNOSIS — R0602 Shortness of breath: Secondary | ICD-10-CM | POA: Diagnosis not present

## 2022-06-10 LAB — MYOCARDIAL PERFUSION IMAGING
LV dias vol: 98 mL (ref 62–150)
LV sys vol: 46 mL
Nuc Stress EF: 53 %
Peak HR: 96 {beats}/min
Rest HR: 60 {beats}/min
Rest Nuclear Isotope Dose: 10.9 mCi
SDS: 0
SRS: 4
SSS: 4
ST Depression (mm): 0 mm
Stress Nuclear Isotope Dose: 31.2 mCi
TID: 1.06

## 2022-06-10 LAB — ECHOCARDIOGRAM COMPLETE
Area-P 1/2: 2.97 cm2
Height: 69 in
S' Lateral: 2.15 cm
Weight: 3568 oz

## 2022-06-10 MED ORDER — TECHNETIUM TC 99M TETROFOSMIN IV KIT
31.2000 | PACK | Freq: Once | INTRAVENOUS | Status: AC | PRN
  Administered 2022-06-10: 31.2 via INTRAVENOUS

## 2022-06-10 MED ORDER — TECHNETIUM TC 99M TETROFOSMIN IV KIT
10.9000 | PACK | Freq: Once | INTRAVENOUS | Status: AC | PRN
  Administered 2022-06-10: 10.9 via INTRAVENOUS

## 2022-06-10 MED ORDER — REGADENOSON 0.4 MG/5ML IV SOLN
0.4000 mg | Freq: Once | INTRAVENOUS | Status: AC
  Administered 2022-06-10: 0.4 mg via INTRAVENOUS

## 2022-06-14 ENCOUNTER — Telehealth: Payer: Self-pay | Admitting: Cardiovascular Disease

## 2022-06-14 ENCOUNTER — Other Ambulatory Visit: Payer: Self-pay | Admitting: Internal Medicine

## 2022-06-14 NOTE — Telephone Encounter (Signed)
Patient is returning call to discuss echo results. °

## 2022-06-14 NOTE — Telephone Encounter (Signed)
Pt already spoken to MD's nurse Please see results note

## 2022-06-15 NOTE — H&P (View-Only) (Signed)
Cardiology Office Note   Date:  06/16/2022   ID:  Jonathan [name], DOB 1945-10-06, MRN [id number]  PCP:  [name], MD  Cardiologist:   Kathlyn Sacramento, MD   Chief Complaint  Patient presents with   Other    Discuss cath. Meds reviewed verbally with pt.      History of Present Illness: Jonathan [name] is a 77 y.o. male who is here today for follow-up visit regarding peripheral arterial disease and recently abnormal stress test.  He has past medical history of essential hypertension, diabetes mellitus, GERD, rheumatoid arthritis, chronic kidney disease and obesity.  He was seen by cardiology in the past in [place] and [place] but subsequently released.  He reports having cardiac catheterization many years ago and was told about mild nonobstructive disease.  He is followed for peripheral arterial disease mainly due to below the knee disease.  He had recent left foot rest pain. Angiography was done in December 2023 and showed no significant aortoiliac disease, on the left side, there was mild to moderate SFA stenosis and no significant popliteal disease.  All tibial and peroneal vessels were occluded with reconstitution of the anterior tibial artery and posterior tibial artery via collaterals in the distal portion just above the ankle.  I attempted angioplasty of the anterior tibial artery antegrade and retrograde but was not able to cross due to long calcified occlusion.  I consulted Dr. Virl Cagey for bypass but the patient was found to have borderline quality vein conduits.  He was seen upon follow-up and complained of increased lower extremity edema as well as exertional dyspnea and some episodes of exertional chest pain. He had an echocardiogram done which showed an EF of 50 to 55% with mild mitral regurgitation.  Lexiscan Myoview was an intermediate risk study and showed medium sized defect in the inferior lateral location consistent with prior infarct with mild peri-infarct  ischemia.  His EF was mildly reduced with wall motion abnormalities. During his last follow-up visit, I switched him from amlodipine to hydrochlorothiazide given his lower extremity edema. He continues to report exertional dyspnea with minimal activities.  Lower extremity edema improved.  His left foot pain also improved.  Currently with no lower extremity ulceration.    Past Medical History:  Diagnosis Date   Arthritis    ra, sees dr Sedonia Small   Coronary artery disease    saw dr zachery in [place] 20 yrs ago, released from cardiology   Diabetes mellitus without complication ([place])    type 2   GERD (gastroesophageal reflux disease)    Hypercholesteremia    Hypertension    Urethral stricture     Past Surgical History:  Procedure Laterality Date   ABDOMINAL AORTOGRAM W/LOWER EXTREMITY N/A 04/20/2022   Procedure: ABDOMINAL AORTOGRAM W/LOWER EXTREMITY;  Surgeon: Wellington Hampshire, MD;  Location: [facility];  Service: Cardiovascular;  Laterality: N/A;   BACK SURGERY     x 3 lower back   CARDIAC CATHETERIZATION  2000   small amt plaque relased from cardiology 20 yrs ago   CHOLECYSTECTOMY     COLONOSCOPY WITH PROPOFOL N/A 10/26/2021   Procedure: COLONOSCOPY WITH PROPOFOL;  Surgeon: Eloise Harman, DO;  [facility];  Service: Endoscopy;  Laterality: N/A;  To arrive at [place] N/A 04/17/2019   Procedure: CYSTOSCOPY WITH URETHRAL DILATATION, CYSTOGRAM;  Surgeon: Ceasar Mons, MD;  Location: [facility];  Service: Urology;  Laterality: N/A;   EYE SURGERY Bilateral    cataracts   IR RADIOLOGIST EVAL & MGMT  07/12/2021   PERIPHERAL VASCULAR BALLOON ANGIOPLASTY  04/20/2022   Procedure: PERIPHERAL VASCULAR BALLOON ANGIOPLASTY;  Surgeon: Wellington Hampshire, MD;  Location: [facility];  Service: Cardiovascular;;  aborted; unable to cross   POLYPECTOMY  10/26/2021   Procedure: POLYPECTOMY INTESTINAL;   Surgeon: Eloise Harman, DO;  [facility];  Service: Endoscopy;;   urethral stricture surgery  15 yrs ago     Current Outpatient Medications  Medication Sig Dispense Refill   albuterol (VENTOLIN HFA) 108 (90 Base) MCG/ACT inhaler Inhale 1-2 puffs into the lungs every 6 (six) hours as needed for wheezing or shortness of breath.     aspirin EC 81 MG tablet Take 81 mg by mouth daily. Swallow whole.     cholecalciferol (VITAMIN D3) 25 MCG (1000 UNIT) tablet Take 1,000 Units by mouth daily.     cyanocobalamin (VITAMIN B12) 1000 MCG tablet Take 1,000 mcg by mouth daily.     finasteride (PROSCAR) 5 MG tablet Take 1 tablet (5 mg total) by mouth daily. 123XX123 tablet 2   folic acid (FOLVITE) 1 MG tablet Take 1 mg by mouth daily.     gabapentin (NEURONTIN) 300 MG capsule Take 1 capsule (300 mg total) by mouth at bedtime. 30 capsule 5   glipiZIDE (GLUCOTROL) 5 MG tablet Take 1 tablet (5 mg total) by mouth 2 (two) times daily before a meal. 30 tablet 0   hydrochlorothiazide (HYDRODIURIL) 25 MG tablet Take 1 tablet (25 mg total) by mouth daily. 90 tablet 0   hydrocortisone (ANUSOL-HC) 2.5 % rectal cream Place 1 Application rectally 2 (two) times daily. For 10 days. May repeat cycle if needed. (Patient taking differently: Place 1 Application rectally 2 (two) times daily as needed for hemorrhoids or anal itching. For 10 days. May repeat cycle if needed.) 30 g 1   losartan (COZAAR) 100 MG tablet Take 1 tablet (100 mg total) by mouth daily. 90 tablet 0   Menthol, Topical Analgesic, (BLUE-EMU MAXIMUM STRENGTH EX) Apply 1 Application topically daily as needed (pain).     methotrexate (RHEUMATREX) 2.5 MG tablet Take 12.5 mg by mouth every Sunday. Caution:Chemotherapy. Protect from light. Take 5 tablets PO once weekly.     metoprolol tartrate (LOPRESSOR) 50 MG tablet TAKE 1 TABLET BY MOUTH TWICE  DAILY 200 tablet 2   omeprazole (PRILOSEC) 20 MG capsule Take 1 capsule (20 mg total) by mouth daily. 30  capsule 5   predniSONE (DELTASONE) 1 MG tablet Take 1 mg by mouth daily with breakfast.     rosuvastatin (CRESTOR) 10 MG tablet TAKE 1 TABLET BY MOUTH DAILY 90 tablet 3   silodosin (RAPAFLO) 8 MG CAPS capsule Take 1 capsule (8 mg total) by mouth in the morning and at bedtime. 200 capsule 2   triamcinolone cream (KENALOG) 0.1 % Apply 1 Application topically daily as needed (itching). 80 g 1   vitamin E 180 MG (400 UNITS) capsule Take 400 Units by mouth daily.     OVER THE COUNTER MEDICATION Take 2 tablets by mouth daily. Omega XL (not Fish oil) (Patient not taking: Reported on 06/16/2022)     No current facility-administered medications for this visit.    Allergies:   Ciprofloxacin, Mosquito (diagnostic), and Oxycodone-acetaminophen    Social History:  The patient  reports that he has never smoked. He has never used smokeless tobacco. He reports that he  does not drink alcohol and does not use drugs.   Family History:  The patient's family history includes Heart Problems in his father.    ROS:  Please see the history of present illness.   Otherwise, review of systems are positive for none.   All other systems are reviewed and negative.    PHYSICAL EXAM: VS:  BP 110/70 (BP Location: Left Arm, Patient Position: Sitting, Cuff Size: Normal)   Pulse 73   Ht '5\' 9"'$  (1.753 m)   Wt 222 lb 4 oz (100.8 kg)   SpO2 98%   BMI 32.82 kg/m  , BMI Body mass index is 32.82 kg/m. GEN: Well nourished, well developed, in no acute distress  HEENT: normal  Neck: no JVD, carotid bruits, or masses Cardiac: RRR; no murmurs, rubs, or gallops, mild left leg edema. Respiratory:  clear to auscultation bilaterally, normal work of breathing GI: soft, nontender, nondistended, + BS MS: no deformity or atrophy  Skin: warm and dry, no rash Neuro:  Strength and sensation are intact Psych: euthymic mood, full affect Vascular: Radial pulses normal bilaterally.   EKG:  EKG is ordered today. EKG showed normal sinus  rhythm with first-degree AV block and left axis deviation.  Poor R wave progression in the anterior leads.   Recent Labs: 11/24/2021: ALT 29; TSH 3.070 04/14/2022: Hemoglobin 12.2; Platelets 222 04/27/2022: BUN 15; Creatinine, Ser 1.37; Potassium 3.8; Sodium 136    Lipid Panel    Component Value Date/Time   CHOL 135 11/24/2021 1606   TRIG 152 (H) 11/24/2021 1606   HDL 40 11/24/2021 1606   CHOLHDL 3.4 11/24/2021 1606   CHOLHDL 2.9 Ratio 07/16/2007 2228   VLDL 20 07/16/2007 2228   LDLCALC 69 11/24/2021 1606      Wt Readings from Last 3 Encounters:  06/16/22 222 lb 4 oz (100.8 kg)  06/10/22 223 lb (101.2 kg)  05/20/22 223 lb 8 oz (101.4 kg)          07/08/2021   10:12 AM  PAD Screen  Previous PAD dx? No  Previous surgical procedure? No  Pain with walking? No  Feet/toe relief with dangling? No  Painful, non-healing ulcers? No  Extremities discolored? Yes      ASSESSMENT AND PLAN:  1.  Coronary artery disease involving native coronary arteries with other forms of angina: He has significant exertional dyspnea with minimal activities which is likely angina equivalent given his prolonged history of diabetes mellitus and other risk factors.  In addition, his nuclear stress test was abnormal with evidence of prior inferior lateral infarct.  His symptoms are consistent with class III angina in spite of treatment with metoprolol.  Also, he has symptoms of heart failure.  I recommend proceeding with a right and left cardiac catheterization and possible PCI.  I discussed the procedure in details as well as risks and benefits.  He does have underlying chronic kidney disease and we will plan on 4 hours of hydration before the procedure.  2. Peripheral arterial disease with rest pain: Fortunately, he currently has no tissue loss and his rest pain improved.   3.  Hyperlipidemia: Continue treatment with rosuvastatin.  Most recent lipid profile showed an LDL of 69.  4.  Essential  hypertension: Blood pressure is controlled on current medications.  5.  Chronic systolic heart failure: Mildly reduced ejection fraction: He appears to be mildly volume overloaded.  Will consider switching losartan and hydrochlorothiazide to Entresto.    Disposition:   Proceed with a right  and left cardiac catheterization next week and follow-up after.   Signed,  Kathlyn Sacramento, MD  06/16/2022 10:37 AM    [place]

## 2022-06-15 NOTE — Progress Notes (Unsigned)
Cardiology Office Note   Date:  06/16/2022   ID:  AH DOOD, DOB 05-10-1945, MRN [id number]  PCP:  [name], MD  Cardiologist:   Kathlyn Sacramento, MD   Chief Complaint  Patient presents with   Other    Discuss cath. Meds reviewed verbally with pt.      History of Present Illness: Jonathan [name] is a 77 y.o. male who is here today for follow-up visit regarding peripheral arterial disease and recently abnormal stress test.  He has past medical history of essential hypertension, diabetes mellitus, GERD, rheumatoid arthritis, chronic kidney disease and obesity.  He was seen by cardiology in the past in [place] and [place] but subsequently released.  He reports having cardiac catheterization many years ago and was told about mild nonobstructive disease.  He is followed for peripheral arterial disease mainly due to below the knee disease.  He had recent left foot rest pain. Angiography was done in December 2023 and showed no significant aortoiliac disease, on the left side, there was mild to moderate SFA stenosis and no significant popliteal disease.  All tibial and peroneal vessels were occluded with reconstitution of the anterior tibial artery and posterior tibial artery via collaterals in the distal portion just above the ankle.  I attempted angioplasty of the anterior tibial artery antegrade and retrograde but was not able to cross due to long calcified occlusion.  I consulted Dr. Virl Cagey for bypass but the patient was found to have borderline quality vein conduits.  He was seen upon follow-up and complained of increased lower extremity edema as well as exertional dyspnea and some episodes of exertional chest pain. He had an echocardiogram done which showed an EF of 50 to 55% with mild mitral regurgitation.  Lexiscan Myoview was an intermediate risk study and showed medium sized defect in the inferior lateral location consistent with prior infarct with mild peri-infarct  ischemia.  His EF was mildly reduced with wall motion abnormalities. During his last follow-up visit, I switched him from amlodipine to hydrochlorothiazide given his lower extremity edema. He continues to report exertional dyspnea with minimal activities.  Lower extremity edema improved.  His left foot pain also improved.  Currently with no lower extremity ulceration.    Past Medical History:  Diagnosis Date   Arthritis    ra, sees dr Sedonia Small   Coronary artery disease    saw dr zachery in [place] 20 yrs ago, released from cardiology   Diabetes mellitus without complication ([place])    type 2   GERD (gastroesophageal reflux disease)    Hypercholesteremia    Hypertension    Urethral stricture     Past Surgical History:  Procedure Laterality Date   ABDOMINAL AORTOGRAM W/LOWER EXTREMITY N/A 04/20/2022   Procedure: ABDOMINAL AORTOGRAM W/LOWER EXTREMITY;  Surgeon: Wellington Hampshire, MD;  Location: [facility];  Service: Cardiovascular;  Laterality: N/A;   BACK SURGERY     x 3 lower back   CARDIAC CATHETERIZATION  2000   small amt plaque relased from cardiology 20 yrs ago   CHOLECYSTECTOMY     COLONOSCOPY WITH PROPOFOL N/A 10/26/2021   Procedure: COLONOSCOPY WITH PROPOFOL;  Surgeon: Eloise Harman, DO;  [facility];  Service: Endoscopy;  Laterality: N/A;  To arrive at [place] N/A 04/17/2019   Procedure: CYSTOSCOPY WITH URETHRAL DILATATION, CYSTOGRAM;  Surgeon: Ceasar Mons, MD;  Location: [facility];  Service: Urology;  Laterality: N/A;   EYE SURGERY Bilateral    cataracts   IR RADIOLOGIST EVAL & MGMT  07/12/2021   PERIPHERAL VASCULAR BALLOON ANGIOPLASTY  04/20/2022   Procedure: PERIPHERAL VASCULAR BALLOON ANGIOPLASTY;  Surgeon: Wellington Hampshire, MD;  Location: [facility];  Service: Cardiovascular;;  aborted; unable to cross   POLYPECTOMY  10/26/2021   Procedure: POLYPECTOMY INTESTINAL;   Surgeon: Eloise Harman, DO;  [facility];  Service: Endoscopy;;   urethral stricture surgery  15 yrs ago     Current Outpatient Medications  Medication Sig Dispense Refill   albuterol (VENTOLIN HFA) 108 (90 Base) MCG/ACT inhaler Inhale 1-2 puffs into the lungs every 6 (six) hours as needed for wheezing or shortness of breath.     aspirin EC 81 MG tablet Take 81 mg by mouth daily. Swallow whole.     cholecalciferol (VITAMIN D3) 25 MCG (1000 UNIT) tablet Take 1,000 Units by mouth daily.     cyanocobalamin (VITAMIN B12) 1000 MCG tablet Take 1,000 mcg by mouth daily.     finasteride (PROSCAR) 5 MG tablet Take 1 tablet (5 mg total) by mouth daily. 123XX123 tablet 2   folic acid (FOLVITE) 1 MG tablet Take 1 mg by mouth daily.     gabapentin (NEURONTIN) 300 MG capsule Take 1 capsule (300 mg total) by mouth at bedtime. 30 capsule 5   glipiZIDE (GLUCOTROL) 5 MG tablet Take 1 tablet (5 mg total) by mouth 2 (two) times daily before a meal. 30 tablet 0   hydrochlorothiazide (HYDRODIURIL) 25 MG tablet Take 1 tablet (25 mg total) by mouth daily. 90 tablet 0   hydrocortisone (ANUSOL-HC) 2.5 % rectal cream Place 1 Application rectally 2 (two) times daily. For 10 days. May repeat cycle if needed. (Patient taking differently: Place 1 Application rectally 2 (two) times daily as needed for hemorrhoids or anal itching. For 10 days. May repeat cycle if needed.) 30 g 1   losartan (COZAAR) 100 MG tablet Take 1 tablet (100 mg total) by mouth daily. 90 tablet 0   Menthol, Topical Analgesic, (BLUE-EMU MAXIMUM STRENGTH EX) Apply 1 Application topically daily as needed (pain).     methotrexate (RHEUMATREX) 2.5 MG tablet Take 12.5 mg by mouth every Sunday. Caution:Chemotherapy. Protect from light. Take 5 tablets PO once weekly.     metoprolol tartrate (LOPRESSOR) 50 MG tablet TAKE 1 TABLET BY MOUTH TWICE  DAILY 200 tablet 2   omeprazole (PRILOSEC) 20 MG capsule Take 1 capsule (20 mg total) by mouth daily. 30  capsule 5   predniSONE (DELTASONE) 1 MG tablet Take 1 mg by mouth daily with breakfast.     rosuvastatin (CRESTOR) 10 MG tablet TAKE 1 TABLET BY MOUTH DAILY 90 tablet 3   silodosin (RAPAFLO) 8 MG CAPS capsule Take 1 capsule (8 mg total) by mouth in the morning and at bedtime. 200 capsule 2   triamcinolone cream (KENALOG) 0.1 % Apply 1 Application topically daily as needed (itching). 80 g 1   vitamin E 180 MG (400 UNITS) capsule Take 400 Units by mouth daily.     OVER THE COUNTER MEDICATION Take 2 tablets by mouth daily. Omega XL (not Fish oil) (Patient not taking: Reported on 06/16/2022)     No current facility-administered medications for this visit.    Allergies:   Ciprofloxacin, Mosquito (diagnostic), and Oxycodone-acetaminophen    Social History:  The patient  reports that he has never smoked. He has never used smokeless tobacco. He reports that he  does not drink alcohol and does not use drugs.   Family History:  The patient's family history includes Heart Problems in his father.    ROS:  Please see the history of present illness.   Otherwise, review of systems are positive for none.   All other systems are reviewed and negative.    PHYSICAL EXAM: VS:  BP 110/70 (BP Location: Left Arm, Patient Position: Sitting, Cuff Size: Normal)   Pulse 73   Ht 5' 9"$  (1.753 m)   Wt 222 lb 4 oz (100.8 kg)   SpO2 98%   BMI 32.82 kg/m  , BMI Body mass index is 32.82 kg/m. GEN: Well nourished, well developed, in no acute distress  HEENT: normal  Neck: no JVD, carotid bruits, or masses Cardiac: RRR; no murmurs, rubs, or gallops, mild left leg edema. Respiratory:  clear to auscultation bilaterally, normal work of breathing GI: soft, nontender, nondistended, + BS MS: no deformity or atrophy  Skin: warm and dry, no rash Neuro:  Strength and sensation are intact Psych: euthymic mood, full affect Vascular: Radial pulses normal bilaterally.   EKG:  EKG is ordered today. EKG showed normal sinus  rhythm with first-degree AV block and left axis deviation.  Poor R wave progression in the anterior leads.   Recent Labs: 11/24/2021: ALT 29; TSH 3.070 04/14/2022: Hemoglobin 12.2; Platelets 222 04/27/2022: BUN 15; Creatinine, Ser 1.37; Potassium 3.8; Sodium 136    Lipid Panel    Component Value Date/Time   CHOL 135 11/24/2021 1606   TRIG 152 (H) 11/24/2021 1606   HDL 40 11/24/2021 1606   CHOLHDL 3.4 11/24/2021 1606   CHOLHDL 2.9 Ratio 07/16/2007 2228   VLDL 20 07/16/2007 2228   LDLCALC 69 11/24/2021 1606      Wt Readings from Last 3 Encounters:  06/16/22 222 lb 4 oz (100.8 kg)  06/10/22 223 lb (101.2 kg)  05/20/22 223 lb 8 oz (101.4 kg)          07/08/2021   10:12 AM  PAD Screen  Previous PAD dx? No  Previous surgical procedure? No  Pain with walking? No  Feet/toe relief with dangling? No  Painful, non-healing ulcers? No  Extremities discolored? Yes      ASSESSMENT AND PLAN:  1.  Coronary artery disease involving native coronary arteries with other forms of angina: He has significant exertional dyspnea with minimal activities which is likely angina equivalent given his prolonged history of diabetes mellitus and other risk factors.  In addition, his nuclear stress test was abnormal with evidence of prior inferior lateral infarct.  His symptoms are consistent with class III angina in spite of treatment with metoprolol.  Also, he has symptoms of heart failure.  I recommend proceeding with a right and left cardiac catheterization and possible PCI.  I discussed the procedure in details as well as risks and benefits.  He does have underlying chronic kidney disease and we will plan on 4 hours of hydration before the procedure.  2. Peripheral arterial disease with rest pain: Fortunately, he currently has no tissue loss and his rest pain improved.   3.  Hyperlipidemia: Continue treatment with rosuvastatin.  Most recent lipid profile showed an LDL of 69.  4.  Essential  hypertension: Blood pressure is controlled on current medications.  5.  Chronic systolic heart failure: Mildly reduced ejection fraction: He appears to be mildly volume overloaded.  Will consider switching losartan and hydrochlorothiazide to Entresto.    Disposition:   Proceed with a right  and left cardiac catheterization next week and follow-up after.   Signed,  Kathlyn Sacramento, MD  06/16/2022 10:37 AM    [place]

## 2022-06-16 ENCOUNTER — Ambulatory Visit: Payer: 59 | Admitting: Podiatry

## 2022-06-16 ENCOUNTER — Ambulatory Visit: Payer: 59 | Attending: Cardiovascular Disease | Admitting: Cardiovascular Disease

## 2022-06-16 ENCOUNTER — Encounter: Payer: Self-pay | Admitting: Cardiovascular Disease

## 2022-06-16 ENCOUNTER — Other Ambulatory Visit
Admission: RE | Admit: 2022-06-16 | Discharge: 2022-06-16 | Disposition: A | Discharge status: Home / Self Care | Payer: 59 | Attending: Cardiovascular Disease | Admitting: Cardiovascular Disease

## 2022-06-16 VITALS — BP 110/70 | HR 73 | Ht 69.0 in | Wt 222.2 lb

## 2022-06-16 DIAGNOSIS — I25118 Atherosclerotic heart disease of native coronary artery with other forms of angina pectoris: Secondary | ICD-10-CM

## 2022-06-16 DIAGNOSIS — I739 Peripheral vascular disease, unspecified: Secondary | ICD-10-CM | POA: Diagnosis not present

## 2022-06-16 DIAGNOSIS — I10 Essential (primary) hypertension: Secondary | ICD-10-CM

## 2022-06-16 DIAGNOSIS — E782 Mixed hyperlipidemia: Secondary | ICD-10-CM | POA: Diagnosis not present

## 2022-06-16 DIAGNOSIS — I5022 Chronic systolic (congestive) heart failure: Secondary | ICD-10-CM

## 2022-06-16 LAB — CBC
HCT: 36.4 % — ABNORMAL LOW (ref 39.0–52.0)
Hemoglobin: 11.5 g/dL — ABNORMAL LOW (ref 13.0–17.0)
MCH: 29 pg (ref 26.0–34.0)
MCHC: 31.6 g/dL (ref 30.0–36.0)
MCV: 91.9 fL (ref 80.0–100.0)
Platelets: 190 10*3/uL (ref 150–400)
RBC: 3.96 MIL/uL — ABNORMAL LOW (ref 4.22–5.81)
RDW: 16 % — ABNORMAL HIGH (ref 11.5–15.5)
WBC: 5.2 10*3/uL (ref 4.0–10.5)
nRBC: 0 % (ref 0.0–0.2)

## 2022-06-16 LAB — BASIC METABOLIC PANEL
Anion gap: 9 (ref 5–15)
BUN: 29 mg/dL — ABNORMAL HIGH (ref 8–23)
CO2: 22 mmol/L (ref 22–32)
Calcium: 8.1 mg/dL — ABNORMAL LOW (ref 8.9–10.3)
Chloride: 101 mmol/L (ref 98–111)
Creatinine, Ser: 1.63 mg/dL — ABNORMAL HIGH (ref 0.61–1.24)
GFR, Estimated: 44 mL/min — ABNORMAL LOW (ref >60)
Glucose, Bld: 142 mg/dL — ABNORMAL HIGH (ref 70–99)
Potassium: 3.6 mmol/L (ref 3.5–5.1)
Sodium: 132 mmol/L — ABNORMAL LOW (ref 135–145)

## 2022-06-16 NOTE — Patient Instructions (Addendum)
Medication Instructions:  No changes *If you need a refill on your cardiac medications before your next appointment, please call your pharmacy*  Testing/Procedures: Your physician has requested that you have a cardiac catheterization. Cardiac catheterization is used to diagnose and/or treat various heart conditions. Doctors may recommend this procedure for a number of different reasons. The most common reason is to evaluate chest pain. Chest pain can be a symptom of coronary artery disease (CAD), and cardiac catheterization can show whether plaque is narrowing or blocking your heart's arteries. This procedure is also used to evaluate the valves, as well as measure the blood flow and oxygen levels in different parts of your heart. For further information please visit [email or web address]. Please follow instruction sheet, as given.    Follow-Up: At [facility], you and your health needs are our priority.  As part of our continuing mission to provide you with exceptional heart care, we have created designated Provider Care Teams.  These Care Teams include your primary Cardiologist (physician) and Advanced Practice Providers (APPs -  Physician Assistants and Nurse Practitioners) who all work together to provide you with the care you need, when you need it.  We recommend signing up for the patient portal called "MyChart".  Sign up information is provided on this After Visit Summary.  MyChart is used to connect with patients for Virtual Visits (Telemedicine).  Patients are able to view lab/test results, encounter notes, upcoming appointments, etc.  Non-urgent messages can be sent to your provider as well.   To learn more about what you can do with MyChart, go to [email or web address].    Your next appointment:   Keep your follow up as planned Other Instructions   [facility] [place], [place] [address] Dept: [phone] Loc:  [phone]   Cardiac/Peripheral Catheterization   You are scheduled for a Cardiac Catheterization on Friday, February 23 with Dr. Kathlyn Sacramento.  1. Arrive at the [facility] entrance at 8:30 am, 4 hours prior to your procedure. Free valet service is available.  After entering the [facility] please check-in at the registration desk (1st desk on your right) to receive your armband. After receiving your armband someone will escort you to the cardiac cath/special procedures waiting area. The support person will be asked to wait in the waiting room.  It is OK to have someone drop you off and come back when you are ready to be discharged.        Special note: Every effort is made to have your procedure done on time. Please understand that emergencies sometimes delay scheduled procedures.   . 2. Diet: Do not eat solid foods after midnight.  You may have clear liquids until 5 AM the day of the procedure.  3. Labs: You will need to have blood drawn on 06/16/22. You do not need to be fasting.  4. Medication instructions in preparation for your procedure: Hold the Hydrochlorothiazide the morning of the procedure Hold all diabetic medication the morning of the procedure (Glipizide)  On the morning of your procedure, take Aspirin 81 mg and any morning medicines NOT listed above.  You may use sips of water.  5. Plan to go home the same day, you will only stay overnight if medically necessary. 6. You MUST have a responsible adult to drive you home. 7. An adult MUST be with you the first 24 hours after you arrive home. 8. Bring a current list of your medications, and the last time and  date medication taken. 9. Bring ID and current insurance cards. 10.Please wear clothes that are easy to get on and off and wear slip-on shoes.  Thank you for allowing [place] to care for you!   -- [facility]

## 2022-06-16 NOTE — Progress Notes (Deleted)
Office Note     CC: Bilateral lower extremity critical ischemia Requesting Provider:  [name], MD  HPI: Jonathan [name] is a 77 y.o. (1945/05/14) male presenting at the request of .[name], MD bilateral lower extremity critical limb ischemia  [name] is a patient of my colleague, Dr. Fletcher Anon.  He recently underwent left lower extremity angiogram in an effort to define and improve perfusion or rest pain.  Angiogram demonstrated severe tibial disease with reconstitution of the dorsalis pedis and distal posterior tibial arteries.  Pedal access, retrograde approach was attempted, but failed.  I was asked to see him to assess his candidacy for open bypass surgery.  On exam, Jonathan [name] was doing well, accompanied by his wife.  Originally from [place], they now reside in [place].  Jonathan [name] has a longstanding history of type 1 diabetes. He notes bilateral lower extremity rest pain at night with left-sided tissue loss between the fourth and fifth digits in the webspace.  This has been present for roughly 1 month and has not worsened, but has not healed either.  His main complaint today is bilateral lower extremity swelling, which has been present over the last 3 weeks.  Both he and his wife feel he has never had lower extremity swelling of this magnitude.  He is also appreciated some breathing difficulties, noting he used to easily climb 1 flight of stairs without issue.  Now he is winded by the time he reaches the top.  The pt is  on a statin for cholesterol management.  The pt is  on a daily aspirin.   Other AC:  - The pt is  on medication for hypertension.   The pt is  diabetic.  Tobacco hx:  -  Past Medical History:  Diagnosis Date   Arthritis    ra, sees dr Sedonia Small   Coronary artery disease    saw dr zachery in [place] 20 yrs ago, released from cardiology   Diabetes mellitus without complication ([place])    type 2   GERD (gastroesophageal reflux disease)     Hypercholesteremia    Hypertension    Urethral stricture     Past Surgical History:  Procedure Laterality Date   ABDOMINAL AORTOGRAM W/LOWER EXTREMITY N/A 04/20/2022   Procedure: ABDOMINAL AORTOGRAM W/LOWER EXTREMITY;  Surgeon: Wellington Hampshire, MD;  Location: [facility];  Service: Cardiovascular;  Laterality: N/A;   BACK SURGERY     x 3 lower back   CARDIAC CATHETERIZATION  2000   small amt plaque relased from cardiology 20 yrs ago   CHOLECYSTECTOMY     COLONOSCOPY WITH PROPOFOL N/A 10/26/2021   Procedure: COLONOSCOPY WITH PROPOFOL;  Surgeon: Eloise Harman, DO;  [facility];  Service: Endoscopy;  Laterality: N/A;  To arrive at [place] N/A 04/17/2019   Procedure: CYSTOSCOPY WITH URETHRAL DILATATION, CYSTOGRAM;  Surgeon: Ceasar Mons, MD;  Location: [facility];  Service: Urology;  Laterality: N/A;   EYE SURGERY Bilateral    cataracts   IR RADIOLOGIST EVAL & MGMT  07/12/2021   PERIPHERAL VASCULAR BALLOON ANGIOPLASTY  04/20/2022   Procedure: PERIPHERAL VASCULAR BALLOON ANGIOPLASTY;  Surgeon: Wellington Hampshire, MD;  Location: [facility];  Service: Cardiovascular;;  aborted; unable to cross   POLYPECTOMY  10/26/2021   Procedure: POLYPECTOMY INTESTINAL;  Surgeon: Eloise Harman, DO;  [facility];  Service: Endoscopy;;   urethral stricture surgery  15 yrs ago    Social History   Socioeconomic History   Marital status: Married    Spouse name: Not on file   Number of children: Not on file   Years of education: Not on file   Highest education level: Not on file  Occupational History   Not on file  Tobacco Use   Smoking status: Never   Smokeless tobacco: Never  Vaping Use   Vaping Use: Never used  Substance and Sexual Activity   Alcohol use: No   Drug use: No   Sexual activity: Not on file  Other Topics Concern   Not on file  Social History Narrative   Not on file    Social Determinants of Health   Financial Resource Strain: Low Risk  (11/15/2021)   Overall Financial Resource Strain (CARDIA)    Difficulty of Paying Living Expenses: Not hard at all  Food Insecurity: No Food Insecurity (11/15/2021)   Hunger Vital Sign    Worried About Running Out of Food in the Last Year: Never true    [place] in the Last Year: Never true  Transportation Needs: No Transportation Needs (11/15/2021)   PRAPARE - Hydrologist (Medical): No    Lack of Transportation (Non-Medical): No  Physical Activity: Sufficiently Active (11/15/2021)   Exercise Vital Sign    Days of Exercise per Week: 5 days    Minutes of Exercise per Session: 30 min  Stress: No Stress Concern Present (11/15/2021)   [place]    Feeling of Stress : Not at all  Social Connections: Moderately Integrated (11/15/2021)   Social Connection and Isolation Panel [NHANES]    Frequency of Communication with Friends and Family: Three times a week    Frequency of Social Gatherings with Friends and Family: Twice a week    Attends Religious Services: More than 4 times per year    Active Member of [employer] or Organizations: No    Attends Archivist Meetings: Never    Marital Status: Married  Human resources officer Violence: Not At Risk (11/15/2021)   Humiliation, Afraid, Rape, and Kick questionnaire    Fear of Current or Ex-Partner: No    Emotionally Abused: No    Physically Abused: No    Sexually Abused: No   Family History  Problem Relation Age of Onset   Heart Problems Father     Current Outpatient Medications  Medication Sig Dispense Refill   albuterol (VENTOLIN HFA) 108 (90 Base) MCG/ACT inhaler Inhale 1-2 puffs into the lungs every 6 (six) hours as needed for wheezing or shortness of breath.     aspirin EC 81 MG tablet Take 81 mg by mouth daily. Swallow whole.     cholecalciferol (VITAMIN D3) 25 MCG  (1000 UNIT) tablet Take 1,000 Units by mouth daily.     cyanocobalamin (VITAMIN B12) 1000 MCG tablet Take 1,000 mcg by mouth daily.     finasteride (PROSCAR) 5 MG tablet Take 1 tablet (5 mg total) by mouth daily. [id number] tablet 2   folic acid (FOLVITE) 1 MG tablet Take 1 mg by mouth daily.     gabapentin (NEURONTIN) 300 MG capsule Take 1 capsule (300 mg total) by mouth at bedtime. 30 capsule 5   glipiZIDE (GLUCOTROL) 5 MG tablet Take 1 tablet (5 mg total) by mouth 2 (two) times daily before a meal. 30 tablet 0   hydrochlorothiazide (HYDRODIURIL) 25 MG tablet Take  1 tablet (25 mg total) by mouth daily. 90 tablet 0   hydrocortisone (ANUSOL-HC) 2.5 % rectal cream Place 1 Application rectally 2 (two) times daily. For 10 days. May repeat cycle if needed. (Patient taking differently: Place 1 Application rectally 2 (two) times daily as needed for hemorrhoids or anal itching. For 10 days. May repeat cycle if needed.) 30 g 1   losartan (COZAAR) 100 MG tablet Take 1 tablet (100 mg total) by mouth daily. 90 tablet 0   Menthol, Topical Analgesic, (BLUE-EMU MAXIMUM STRENGTH EX) Apply 1 Application topically daily as needed (pain).     methotrexate (RHEUMATREX) 2.5 MG tablet Take 12.5 mg by mouth every Sunday. Caution:Chemotherapy. Protect from light. Take 5 tablets PO once weekly.     metoprolol tartrate (LOPRESSOR) 50 MG tablet TAKE 1 TABLET BY MOUTH TWICE  DAILY 200 tablet 2   omeprazole (PRILOSEC) 20 MG capsule Take 1 capsule (20 mg total) by mouth daily. 30 capsule 5   OVER THE COUNTER MEDICATION Take 2 tablets by mouth daily. Omega XL (not Fish oil) (Patient not taking: Reported on 06/16/2022)     predniSONE (DELTASONE) 1 MG tablet Take 1 mg by mouth daily with breakfast.     rosuvastatin (CRESTOR) 10 MG tablet TAKE 1 TABLET BY MOUTH DAILY 90 tablet 3   silodosin (RAPAFLO) 8 MG CAPS capsule Take 1 capsule (8 mg total) by mouth in the morning and at bedtime. 200 capsule 2   triamcinolone cream (KENALOG) 0.1 %  Apply 1 Application topically daily as needed (itching). 80 g 1   vitamin E 180 MG (400 UNITS) capsule Take 400 Units by mouth daily.     No current facility-administered medications for this visit.    Allergies  Allergen Reactions   Ciprofloxacin Itching   Mosquito (Diagnostic) Itching   Oxycodone-Acetaminophen Rash     REVIEW OF SYSTEMS:  [X]$  denotes positive finding, [ ]$  denotes negative finding Cardiac  Comments:  Chest pain or chest pressure:    Shortness of breath upon exertion:    Short of breath when lying flat:    Irregular heart rhythm:        Vascular    Pain in calf, thigh, or hip brought on by ambulation:    Pain in feet at night that wakes you up from your sleep:     Blood clot in your veins:    Leg swelling:         Pulmonary    Oxygen at home:    Productive cough:     Wheezing:         Neurologic    Sudden weakness in arms or legs:     Sudden numbness in arms or legs:     Sudden onset of difficulty speaking or slurred speech:    Temporary loss of vision in one eye:     Problems with dizziness:         Gastrointestinal    Blood in stool:     Vomited blood:         Genitourinary    Burning when urinating:     Blood in urine:        Psychiatric    Major depression:         Hematologic    Bleeding problems:    Problems with blood clotting too easily:        Skin    Rashes or ulcers:        Constitutional    Fever  or chills:      PHYSICAL EXAMINATION:  There were no vitals filed for this visit.   General:  WDWN in NAD; vital signs documented above Gait: Not observed HENT: WNL, normocephalic Pulmonary: normal non-labored breathing , without wheezing Cardiac: regular HR Abdomen: soft, NT, no masses Skin: without rashes Vascular Exam/Pulses:  Right Left  Radial 2+ (normal) 2+ (normal)  Ulnar 2+ (normal) 2+ (normal)  Femoral    Popliteal    DP absent absent  PT absent absent   Extremities: with ischemic changes, without  Gangrene , without cellulitis; with open wounds;  Musculoskeletal: no muscle wasting or atrophy  Neurologic: A&O X 3;  No focal weakness or paresthesias are detected Psychiatric:  The pt has Normal affect.   Non-Invasive Vascular Imaging:   See angiogram    ASSESSMENT/PLAN: FAHD ANTCZAK is a 77 y.o. male presenting with left lower extremity critical limb ischemia with tissue loss, right lower extremity critical limb ischemia with rest pain.  I reviewed Hanif's recent left lower extremity angiogram which demonstrated no named tibial vessels in the calf.  He has occlusion at the P3 segment of the popliteal artery with reconstitution in the dorsalis pedis and posterior tibial arteries.  Both of these plantar arteries are small.  Furthermore, I examined his vein mapping.  Veins in bilateral legs are borderline in regards to their candidacy to use his conduit.  I had a long discussion with Braylon and his wife.  Unfortunately, his options are few.  Hau could continue with medical management and pursue left-sided above-knee amputation should the wound progress.  I also offered high risk, below-knee popliteal artery to distal posterior tibial artery bypass with plans to use vein should the conduit looks suitable.  He is aware that this bypass has a high failure rate, especially if PTFE was necessary for the bypass.  I am also concerned that beyond the conduit, and poor outflow, he has poor inflow due to heart failure as demonstrated by his recent angiogram.  Sencere asked that everything be done in an effort to save his leg, therefore I am happy to attempt revascularization.  Prior to attending revascularization, I would appreciate Lin seeing Dr. Sophronia Simas in an effort to decrease the lower extremity edema he currently has and ensure he does not require cardiac catheterization.  I have attached Dr. Sophronia Simas to this note, and plan to call [facility] immediately after he is evaluated from a  cardiac standpoint to schedule surgery.   Broadus John, MD [facility] [phone]

## 2022-06-17 ENCOUNTER — Ambulatory Visit: Payer: 59 | Admitting: Vascular Surgery

## 2022-06-20 NOTE — Addendum Note (Signed)
Addended by: Britt Bottom on: 06/20/2022 10:16 AM   Modules accepted: Orders

## 2022-06-23 ENCOUNTER — Ambulatory Visit (INDEPENDENT_AMBULATORY_CARE_PROVIDER_SITE_OTHER): Payer: 59 | Admitting: Podiatry

## 2022-06-23 DIAGNOSIS — L97522 Non-pressure chronic ulcer of other part of left foot with fat layer exposed: Secondary | ICD-10-CM

## 2022-06-23 DIAGNOSIS — M2041 Other hammer toe(s) (acquired), right foot: Secondary | ICD-10-CM

## 2022-06-23 DIAGNOSIS — E1142 Type 2 diabetes mellitus with diabetic polyneuropathy: Secondary | ICD-10-CM

## 2022-06-23 DIAGNOSIS — L84 Corns and callosities: Secondary | ICD-10-CM

## 2022-06-23 DIAGNOSIS — M2042 Other hammer toe(s) (acquired), left foot: Secondary | ICD-10-CM

## 2022-06-23 NOTE — Progress Notes (Signed)
Subjective:  Patient ID: Jonathan [name], male    DOB: 03-07-1946,  MRN: [id number]  Chief Complaint  Patient presents with   Follow-up    Patient is diabetic. Patient states that his foot is still hurting, he feels like his foot is healing.     77 y.o. male presents follow-up of small ulceration interdigitally on the left fifth toe.  Reports that he was discharged from wound care clinic.  Patient still having significant pain in the left fifth toe.  Has not been putting Betadine on the area every day as previously instructed just doing sometimes.  Not using a spacer device frequently either.  Past Medical History:  Diagnosis Date   Arthritis    ra, sees dr Sedonia Small   Coronary artery disease    saw dr zachery in [place] 20 yrs ago, released from cardiology   Diabetes mellitus without complication ([place])    type 2   GERD (gastroesophageal reflux disease)    Hypercholesteremia    Hypertension    Urethral stricture     Allergies  Allergen Reactions   Ciprofloxacin Itching   Mosquito (Diagnostic) Itching   Oxycodone-Acetaminophen Rash    ROS: Negative except as per HPI above  Objective:  General: AAO x3, NAD  Dermatological: Attention to the medial aspect of the 5th toe left foot there is maceration and hyperkeratotic lesion present without significant open wound.  NO malodor drainage but maceration of the web space.  Slightly improved with decreased maceration prior no probe to bone or erythema or edema of the L 5th toe.  Still with significant pain on palpation of the area    Vascular:  Dorsalis Pedis artery and Posterior Tibial artery pedal pulses are 2/4 bilateral.  Capillary fill time < 3 sec to all digits.   Neruologic: Grossly intact via light touch bilateral. Protective threshold intact to all sites bilateral.   Musculoskeletal: No gross boney pedal deformities bilateral. No pain, crepitus, or limitation noted with foot and ankle range of motion bilateral.  Muscular strength 5/5 in all groups tested bilateral.  Gait: Unassisted, Nonantalgic.   Radiographs:  Date: 05/05/22 XR both feet Weightbearing AP/Lateral/Oblique   Findings: digital contractures of the 2nd toe bilaterally Assessment:   1. Ulcer of left foot with fat layer exposed ([place])   2. Pre-ulcerative calluses   3. Hammer toes of both feet   4. DM type 2 with diabetic peripheral neuropathy ([place])     Plan:  Patient was evaluated and treated and all questions answered.  Ulcer medial aspect of the L 5th toe to subcutaneous fat tissue. -We discussed the etiology and factors that are a part of the wound healing process.  We also discussed the risk of infection both soft tissue and osteomyelitis from open ulceration.  Discussed the risk of limb loss if this happens or worsens. -No debridment indicated at this time. -Dressed with betadine gauze in between 4th and 5th toe -Continue home dressing changes daily with placing betadine gauze between 4th and 5th toe -Continue off-loading with gauze between toes.  Also dispensed gel toe caps for the patient to try and pad the area and prevent pressure in between the fourth and fifth toe.  -Last antibiotics: No abx currently indicated -Imaging: x-ray reviewed, shows no signs of erosions, osteolysis, osteomyelitis or emphysema. -Continue with local wound care at this time. -I discussed possible surgical intervention would include possible phalangectomy of the middle phalanx of the left fifth toe.  This would prevent  pressure on the area between the fifth toe DIPJ and the fourth toe PIPJ.  Patient will think about this and we will talk about it again in the future visit.  Return in about 4 weeks (around 07/21/2022).          Everitt Amber, DPM [facility]

## 2022-06-24 ENCOUNTER — Other Ambulatory Visit: Payer: Self-pay

## 2022-06-24 ENCOUNTER — Ambulatory Visit
Admission: RE | Admit: 2022-06-24 | Discharge: 2022-06-24 | Disposition: A | Discharge status: Home / Self Care | Payer: 59 | Source: Ambulatory Visit | Attending: Cardiovascular Disease | Admitting: Cardiovascular Disease

## 2022-06-24 ENCOUNTER — Encounter
Admission: RE | Disposition: A | Discharge status: Home / Self Care | Payer: Self-pay | Source: Ambulatory Visit | Attending: Cardiovascular Disease

## 2022-06-24 ENCOUNTER — Encounter: Payer: Self-pay | Admitting: Cardiovascular Disease

## 2022-06-24 DIAGNOSIS — Z79899 Other long term (current) drug therapy: Secondary | ICD-10-CM | POA: Diagnosis not present

## 2022-06-24 DIAGNOSIS — N189 Chronic kidney disease, unspecified: Secondary | ICD-10-CM | POA: Diagnosis not present

## 2022-06-24 DIAGNOSIS — I272 Pulmonary hypertension, unspecified: Secondary | ICD-10-CM | POA: Insufficient documentation

## 2022-06-24 DIAGNOSIS — I5022 Chronic systolic (congestive) heart failure: Secondary | ICD-10-CM

## 2022-06-24 DIAGNOSIS — Z7984 Long term (current) use of oral hypoglycemic drugs: Secondary | ICD-10-CM | POA: Insufficient documentation

## 2022-06-24 DIAGNOSIS — E785 Hyperlipidemia, unspecified: Secondary | ICD-10-CM | POA: Diagnosis not present

## 2022-06-24 DIAGNOSIS — E669 Obesity, unspecified: Secondary | ICD-10-CM | POA: Diagnosis not present

## 2022-06-24 DIAGNOSIS — I2582 Chronic total occlusion of coronary artery: Secondary | ICD-10-CM | POA: Diagnosis not present

## 2022-06-24 DIAGNOSIS — M069 Rheumatoid arthritis, unspecified: Secondary | ICD-10-CM | POA: Insufficient documentation

## 2022-06-24 DIAGNOSIS — I2510 Atherosclerotic heart disease of native coronary artery without angina pectoris: Secondary | ICD-10-CM

## 2022-06-24 DIAGNOSIS — Z6832 Body mass index (BMI) 32.0-32.9, adult: Secondary | ICD-10-CM | POA: Diagnosis not present

## 2022-06-24 DIAGNOSIS — K219 Gastro-esophageal reflux disease without esophagitis: Secondary | ICD-10-CM | POA: Insufficient documentation

## 2022-06-24 DIAGNOSIS — E1122 Type 2 diabetes mellitus with diabetic chronic kidney disease: Secondary | ICD-10-CM | POA: Insufficient documentation

## 2022-06-24 DIAGNOSIS — R9439 Abnormal result of other cardiovascular function study: Secondary | ICD-10-CM

## 2022-06-24 DIAGNOSIS — I25118 Atherosclerotic heart disease of native coronary artery with other forms of angina pectoris: Secondary | ICD-10-CM | POA: Diagnosis not present

## 2022-06-24 DIAGNOSIS — E1151 Type 2 diabetes mellitus with diabetic peripheral angiopathy without gangrene: Secondary | ICD-10-CM | POA: Insufficient documentation

## 2022-06-24 DIAGNOSIS — I130 Hypertensive heart and chronic kidney disease with heart failure and stage 1 through stage 4 chronic kidney disease, or unspecified chronic kidney disease: Secondary | ICD-10-CM | POA: Diagnosis not present

## 2022-06-24 DIAGNOSIS — R9430 Abnormal result of cardiovascular function study, unspecified: Secondary | ICD-10-CM | POA: Diagnosis present

## 2022-06-24 HISTORY — DX: Peripheral vascular disease, unspecified: I73.9

## 2022-06-24 HISTORY — DX: Dyspnea, unspecified: R06.00

## 2022-06-24 HISTORY — PX: RIGHT/LEFT HEART CATH AND CORONARY ANGIOGRAPHY: CATH118266

## 2022-06-24 HISTORY — DX: Chronic kidney disease, unspecified: N18.9

## 2022-06-24 LAB — GLUCOSE, CAPILLARY
Glucose-Capillary: 102 mg/dL — ABNORMAL HIGH (ref 70–99)
Glucose-Capillary: 69 mg/dL — ABNORMAL LOW (ref 70–99)

## 2022-06-24 SURGERY — RIGHT/LEFT HEART CATH AND CORONARY ANGIOGRAPHY
Anesthesia: Moderate Sedation | Laterality: Bilateral

## 2022-06-24 MED ORDER — HEPARIN SODIUM (PORCINE) 1000 UNIT/ML IJ SOLN
INTRAMUSCULAR | Status: DC | PRN
  Administered 2022-06-24: 2000 [IU] via INTRAVENOUS
  Administered 2022-06-24: 5000 [IU] via INTRAVENOUS

## 2022-06-24 MED ORDER — ONDANSETRON HCL 4 MG/2ML IJ SOLN: 4.0000 mg | Freq: Four times a day (QID) | INTRAMUSCULAR | Status: DC | PRN

## 2022-06-24 MED ORDER — HEPARIN SODIUM (PORCINE) 1000 UNIT/ML IJ SOLN
INTRAMUSCULAR | Status: AC
  Filled 2022-06-24: qty 10

## 2022-06-24 MED ORDER — VERAPAMIL HCL 2.5 MG/ML IV SOLN
INTRAVENOUS | Status: DC | PRN
  Administered 2022-06-24: 2.5 mg via INTRA_ARTERIAL
  Administered 2022-06-24: 2.5 mg via INTRAVENOUS

## 2022-06-24 MED ORDER — MIDAZOLAM HCL 2 MG/2ML IJ SOLN
INTRAMUSCULAR | Status: DC | PRN
  Administered 2022-06-24: 1 mg via INTRAVENOUS

## 2022-06-24 MED ORDER — IOHEXOL 300 MG/ML  SOLN
INTRAMUSCULAR | Status: DC | PRN
  Administered 2022-06-24: 33 mL

## 2022-06-24 MED ORDER — SODIUM CHLORIDE 0.9% FLUSH: 3.0000 mL | INTRAVENOUS | Status: DC | PRN

## 2022-06-24 MED ORDER — SODIUM CHLORIDE 0.9 % IV SOLN: 250.0000 mL | INTRAVENOUS | Status: DC | PRN

## 2022-06-24 MED ORDER — MIDAZOLAM HCL 2 MG/2ML IJ SOLN
INTRAMUSCULAR | Status: AC
  Filled 2022-06-24: qty 2

## 2022-06-24 MED ORDER — SODIUM CHLORIDE 0.9 % IV SOLN: INTRAVENOUS | Status: DC

## 2022-06-24 MED ORDER — SODIUM CHLORIDE 0.9% FLUSH: 3.0000 mL | Freq: Two times a day (BID) | INTRAVENOUS | Status: DC

## 2022-06-24 MED ORDER — ASPIRIN 81 MG PO CHEW: 81.0000 mg | CHEWABLE_TABLET | ORAL | Status: AC

## 2022-06-24 MED ORDER — FENTANYL CITRATE (PF) 100 MCG/2ML IJ SOLN
INTRAMUSCULAR | Status: DC | PRN
  Administered 2022-06-24: 25 ug via INTRAVENOUS

## 2022-06-24 MED ORDER — HEPARIN (PORCINE) IN NACL 1000-0.9 UT/500ML-% IV SOLN
INTRAVENOUS | Status: AC
  Filled 2022-06-24: qty 1000

## 2022-06-24 MED ORDER — VERAPAMIL HCL 2.5 MG/ML IV SOLN
INTRAVENOUS | Status: AC
  Filled 2022-06-24: qty 2

## 2022-06-24 MED ORDER — HEPARIN (PORCINE) IN NACL 2000-0.9 UNIT/L-% IV SOLN
INTRAVENOUS | Status: DC | PRN
  Administered 2022-06-24: 1000 mL

## 2022-06-24 MED ORDER — ACETAMINOPHEN 325 MG PO TABS: 650.0000 mg | ORAL_TABLET | ORAL | Status: DC | PRN

## 2022-06-24 MED ORDER — LABETALOL HCL 5 MG/ML IV SOLN: 10.0000 mg | INTRAVENOUS | Status: DC | PRN

## 2022-06-24 MED ORDER — FENTANYL CITRATE (PF) 100 MCG/2ML IJ SOLN
INTRAMUSCULAR | Status: AC
  Filled 2022-06-24: qty 2

## 2022-06-24 MED ORDER — ASPIRIN 81 MG PO CHEW
CHEWABLE_TABLET | ORAL | Status: AC
  Administered 2022-06-24: 81 mg via ORAL
  Filled 2022-06-24: qty 1

## 2022-06-24 SURGICAL SUPPLY — 15 items
CATH BALLN WEDGE 5F 110CM (CATHETERS) IMPLANT
CATH INFINITI 5 FR JL3.5 (CATHETERS) IMPLANT
CATH INFINITI 5FR JK (CATHETERS) IMPLANT
CATH INFINITI JR4 5F (CATHETERS) IMPLANT
DEVICE RAD TR BAND REGULAR (VASCULAR PRODUCTS) IMPLANT
DRAPE BRACHIAL (DRAPES) IMPLANT
GLIDESHEATH SLEND SS 6F .021 (SHEATH) IMPLANT
GUIDEWIRE INQWIRE 1.5J.035X260 (WIRE) IMPLANT
INQWIRE 1.5J .035X260CM (WIRE) ×1
PACK CARDIAC CATH (CUSTOM PROCEDURE TRAY) ×1 IMPLANT
PROTECTION STATION PRESSURIZED (MISCELLANEOUS) ×1
SET ATX SIMPLICITY (MISCELLANEOUS) IMPLANT
SHEATH 6FR 85 DEST SLENDER (SHEATH) IMPLANT
SHEATH GLIDE SLENDER 4/5FR (SHEATH) IMPLANT
STATION PROTECTION PRESSURIZED (MISCELLANEOUS) IMPLANT

## 2022-06-24 NOTE — OR Nursing (Signed)
Patient didn't take am meds. Dr Fletcher Anon notified bp 177/76. Instructed to give patient home losartin 100 mg and metoprolol 50 mg.

## 2022-06-24 NOTE — Interval H&P Note (Signed)
History and Physical Interval Note:  06/24/2022 1:51 PM  Jonathan [name]  has presented today for surgery, with the diagnosis of R and L Cath    Abnormal stress test   Chest pain  4 HRS HYDRATION   Pt arrive 8:30a.  The various methods of treatment have been discussed with the patient and family. After consideration of risks, benefits and other options for treatment, the patient has consented to  Procedure(s): RIGHT/LEFT HEART CATH AND CORONARY ANGIOGRAPHY (Bilateral) as a surgical intervention.  The patient's history has been reviewed, patient examined, no change in status, stable for surgery.  I have reviewed the patient's chart and labs.  Questions were answered to the patient's satisfaction.     Kathlyn [place]

## 2022-06-27 ENCOUNTER — Other Ambulatory Visit: Payer: Self-pay | Admitting: Internal Medicine

## 2022-06-28 ENCOUNTER — Encounter: Payer: Self-pay | Admitting: Family Medicine

## 2022-06-28 ENCOUNTER — Ambulatory Visit (INDEPENDENT_AMBULATORY_CARE_PROVIDER_SITE_OTHER): Payer: 59 | Admitting: Family Medicine

## 2022-06-28 ENCOUNTER — Other Ambulatory Visit: Payer: Self-pay | Admitting: Internal Medicine

## 2022-06-28 VITALS — BP 122/82 | HR 98 | Ht 69.0 in | Wt 220.0 lb

## 2022-06-28 DIAGNOSIS — N400 Enlarged prostate without lower urinary tract symptoms: Secondary | ICD-10-CM

## 2022-06-28 DIAGNOSIS — M5442 Lumbago with sciatica, left side: Secondary | ICD-10-CM | POA: Diagnosis not present

## 2022-06-28 DIAGNOSIS — W19XXXA Unspecified fall, initial encounter: Secondary | ICD-10-CM | POA: Diagnosis not present

## 2022-06-28 DIAGNOSIS — G8929 Other chronic pain: Secondary | ICD-10-CM | POA: Diagnosis not present

## 2022-06-28 LAB — POCT I-STAT 7, (LYTES, BLD GAS, ICA,H+H)
Acid-base deficit: 2 mmol/L (ref 0.0–2.0)
Bicarbonate: 22.9 mmol/L (ref 20.0–28.0)
Calcium, Ion: 1.19 mmol/L (ref 1.15–1.40)
HCT: 36 % — ABNORMAL LOW (ref 39.0–52.0)
Hemoglobin: 12.2 g/dL — ABNORMAL LOW (ref 13.0–17.0)
O2 Saturation: 95 %
Potassium: 3.6 mmol/L (ref 3.5–5.1)
Sodium: 139 mmol/L (ref 135–145)
TCO2: 24 mmol/L (ref 22–32)
pCO2 arterial: 39.1 mmHg (ref 32–48)
pH, Arterial: 7.377 (ref 7.35–7.45)
pO2, Arterial: 79 mmHg — ABNORMAL LOW (ref 83–108)

## 2022-06-28 LAB — POCT I-STAT EG7
Acid-base deficit: 1 mmol/L (ref 0.0–2.0)
Bicarbonate: 24.4 mmol/L (ref 20.0–28.0)
Calcium, Ion: 1.2 mmol/L (ref 1.15–1.40)
HCT: 37 % — ABNORMAL LOW (ref 39.0–52.0)
Hemoglobin: 12.6 g/dL — ABNORMAL LOW (ref 13.0–17.0)
O2 Saturation: 71 %
Potassium: 3.6 mmol/L (ref 3.5–5.1)
Sodium: 138 mmol/L (ref 135–145)
TCO2: 26 mmol/L (ref 22–32)
pCO2, Ven: 42.2 mmHg — ABNORMAL LOW (ref 44–60)
pH, Ven: 7.371 (ref 7.25–7.43)
pO2, Ven: 38 mmHg (ref 32–45)

## 2022-06-28 MED ORDER — METHYLPREDNISOLONE 4 MG PO TBPK: ORAL_TABLET | ORAL | 0 refills | Status: DC

## 2022-06-28 NOTE — Assessment & Plan Note (Addendum)
Chronic condition Flare[place] 2 weeks ago History of 3 back surgeries Denies red flag symptoms Reports lumbar back pain with left lower extremity sciatica Reports minimal relief of his symptoms with tramadol 50 mg as needed Pain is aggravated with movement and is relieved with rest The patient is taking prednisone 1 mg daily as prescribed by his rheumatologist Will treat today with steroid taper Encouraged the patient to stop taking prednisone 1 mg daily while taking Medrol Dosepak and to resume therapy after completion of Medrol Dosepak Patient verbalized understanding Encouraged to continue taking tramadol 50 mg as needed Encouraged heat application to the lower back Encouraged to follow-up with orthopedics for worsening  symptoms

## 2022-06-28 NOTE — Assessment & Plan Note (Addendum)
Reports of fall this morning on 06/28/2022 He reports sleeping on the edge of the bed, rolling out of his bed, falling, and landing on his right shoulder. His right hand was outstretched He complains of right wrist pain with swelling of the right middle ring finger Pain is reported with flexion of the right middle ring finger with no evidence of deformity Sensation is intact of all fingers +2 radial pulse palpated Tenderness with palpation of the right wrist and hand No redness, or warmth with palpation  Will get imaging studies of the right wrist to assess for fracture or dislocation Encouraged use of Tylenol as needed for pain control

## 2022-06-28 NOTE — Progress Notes (Signed)
Acute Office Visit  Subjective:    Patient ID: Jonathan [name], male    DOB: [birth year], 77 y.o.   MRN: [id number]  Chief Complaint  Patient presents with   Back Pain    Pt reports a fall this morning 06/28/2022, pt reports right hand (ringer finger swelling), also scratched his knee. Had initially been having back pain for 2 weeks (06/14/22)    HPI Patient is in today with complaints of low back pain and a recent fall on 06/28/2022.  Past Medical History:  Diagnosis Date   Arthritis    ra, sees dr Sedonia Small   Chronic kidney disease    Coronary artery disease    saw dr zachery in [place] 20 yrs ago, released from cardiology   Diabetes mellitus without complication ([place])    type 2   Dyspnea    GERD (gastroesophageal reflux disease)    Hypercholesteremia    Hypertension    Peripheral vascular disease ([place])    Urethral stricture     Past Surgical History:  Procedure Laterality Date   ABDOMINAL AORTOGRAM W/LOWER EXTREMITY N/A 04/20/2022   Procedure: ABDOMINAL AORTOGRAM W/LOWER EXTREMITY;  Surgeon: Wellington Hampshire, MD;  Location: [facility];  Service: Cardiovascular;  Laterality: N/A;   BACK SURGERY     x 3 lower back   CARDIAC CATHETERIZATION  2000   small amt plaque relased from cardiology 20 yrs ago   CHOLECYSTECTOMY     COLONOSCOPY WITH PROPOFOL N/A 10/26/2021   Procedure: COLONOSCOPY WITH PROPOFOL;  Surgeon: Eloise Harman, DO;  [facility];  Service: Endoscopy;  Laterality: N/A;  To arrive at [place] N/A 04/17/2019   Procedure: CYSTOSCOPY WITH URETHRAL DILATATION, CYSTOGRAM;  Surgeon: Ceasar Mons, MD;  [facility]  Service: Urology;  Laterality: N/A;   EYE SURGERY Bilateral    cataracts   IR RADIOLOGIST EVAL & MGMT  07/12/2021   PERIPHERAL VASCULAR BALLOON ANGIOPLASTY  04/20/2022   Procedure: PERIPHERAL VASCULAR BALLOON ANGIOPLASTY;  Surgeon: Wellington Hampshire,  MD;  Location: [facility];  Service: Cardiovascular;;  aborted; unable to cross   POLYPECTOMY  10/26/2021   Procedure: POLYPECTOMY INTESTINAL;  Surgeon: Eloise Harman, DO;  [facility];  Service: Endoscopy;;   RIGHT/LEFT HEART CATH AND CORONARY ANGIOGRAPHY Bilateral 06/24/2022   Procedure: RIGHT/LEFT HEART CATH AND CORONARY ANGIOGRAPHY;  Surgeon: Wellington Hampshire, MD;  Location: [facility];  Service: Cardiovascular;  Laterality: Bilateral;   urethral stricture surgery  15 yrs ago    Family History  Problem Relation Age of Onset   Heart Problems Father     Social History   Socioeconomic History   Marital status: Married    Spouse name: Not on file   Number of children: Not on file   Years of education: Not on file   Highest education level: Not on file  Occupational History   Not on file  Tobacco Use   Smoking status: Never   Smokeless tobacco: Never  Vaping Use   Vaping Use: Never used  Substance and Sexual Activity   Alcohol use: No   Drug use: No   Sexual activity: Not on file  Other Topics Concern   Not on file  Social History Narrative   Not on file   Social Determinants of Health   Financial Resource Strain: Low Risk  (11/15/2021)   Overall Financial Resource Strain (CARDIA)  Difficulty of Paying Living Expenses: Not hard at all  Food Insecurity: No Food Insecurity (11/15/2021)   Hunger Vital Sign    Worried About Running Out of Food in the Last Year: Never true    Ran Out of Food in the Last Year: Never true  Transportation Needs: No Transportation Needs (11/15/2021)   PRAPARE - Hydrologist (Medical): No    Lack of Transportation (Non-Medical): No  Physical Activity: Sufficiently Active (11/15/2021)   Exercise Vital Sign    Days of Exercise per Week: 5 days    Minutes of Exercise per Session: 30 min  Stress: No Stress Concern Present (11/15/2021)   [place]    Feeling of Stress : Not at all  Social Connections: Moderately Integrated (11/15/2021)   Social Connection and Isolation Panel [NHANES]    Frequency of Communication with Friends and Family: Three times a week    Frequency of Social Gatherings with Friends and Family: Twice a week    Attends Religious Services: More than 4 times per year    Active Member of [employer] or Organizations: No    Attends Archivist Meetings: Never    Marital Status: Married  Human resources officer Violence: Not At Risk (11/15/2021)   Humiliation, Afraid, Rape, and Kick questionnaire    Fear of Current or Ex-Partner: No    Emotionally Abused: No    Physically Abused: No    Sexually Abused: No    Outpatient Medications Prior to Visit  Medication Sig Dispense Refill   albuterol (VENTOLIN HFA) 108 (90 Base) MCG/ACT inhaler Inhale 1-2 puffs into the lungs every 6 (six) hours as needed for wheezing or shortness of breath.     aspirin EC 81 MG tablet Take 81 mg by mouth daily. Swallow whole.     cholecalciferol (VITAMIN D3) 25 MCG (1000 UNIT) tablet Take 1,000 Units by mouth daily.     cyanocobalamin (VITAMIN B12) 1000 MCG tablet Take 1,000 mcg by mouth daily.     finasteride (PROSCAR) 5 MG tablet Take 1 tablet (5 mg total) by mouth daily. 123XX123 tablet 2   folic acid (FOLVITE) 1 MG tablet Take 1 mg by mouth daily.     gabapentin (NEURONTIN) 300 MG capsule Take 1 capsule (300 mg total) by mouth at bedtime. 30 capsule 5   glipiZIDE (GLUCOTROL) 5 MG tablet TAKE 1 TABLET BY MOUTH TWICE DAILY BEFORE A MEAL 30 tablet 0   hydrochlorothiazide (HYDRODIURIL) 25 MG tablet Take 1 tablet (25 mg total) by mouth daily. 90 tablet 0   hydrocortisone (ANUSOL-HC) 2.5 % rectal cream Place 1 Application rectally 2 (two) times daily. For 10 days. May repeat cycle if needed. (Patient taking differently: Place 1 Application rectally 2 (two) times daily as needed for hemorrhoids or anal itching. For 10 days.  May repeat cycle if needed.) 30 g 1   linaclotide (LINZESS) 145 MCG CAPS capsule Take 145 mcg by mouth daily as needed.     losartan (COZAAR) 100 MG tablet Take 1 tablet (100 mg total) by mouth daily. 90 tablet 0   Menthol, Topical Analgesic, (BLUE-EMU MAXIMUM STRENGTH EX) Apply 1 Application topically daily as needed (pain).     methotrexate (RHEUMATREX) 2.5 MG tablet Take 12.5 mg by mouth every Sunday. Caution:Chemotherapy. Protect from light. Take 5 tablets PO once weekly.     metoprolol tartrate (LOPRESSOR) 50 MG tablet TAKE 1 TABLET BY MOUTH TWICE  DAILY 200  tablet 2   omeprazole (PRILOSEC) 20 MG capsule Take 1 capsule (20 mg total) by mouth daily. 30 capsule 5   OVER THE COUNTER MEDICATION Take 2 tablets by mouth daily. Omega XL (not Fish oil)     predniSONE (DELTASONE) 1 MG tablet Take 1 mg by mouth daily with breakfast.     rosuvastatin (CRESTOR) 10 MG tablet TAKE 1 TABLET BY MOUTH DAILY 90 tablet 3   silodosin (RAPAFLO) 8 MG CAPS capsule Take 1 capsule (8 mg total) by mouth in the morning and at bedtime. 200 capsule 2   traMADol (ULTRAM) 50 MG tablet Take by mouth 2 (two) times daily as needed.     triamcinolone cream (KENALOG) 0.1 % Apply 1 Application topically daily as needed (itching). 80 g 1   vitamin E 180 MG (400 UNITS) capsule Take 400 Units by mouth daily.     No facility-administered medications prior to visit.    Allergies  Allergen Reactions   Ciprofloxacin Itching   Mosquito (Diagnostic) Itching   Oxycodone-Acetaminophen Rash    Review of Systems  Constitutional:  Negative for fatigue and fever.  Eyes:  Negative for visual disturbance.  Respiratory:  Negative for chest tightness and shortness of breath.   Cardiovascular:  Negative for chest pain and palpitations.  Musculoskeletal:  Positive for arthralgias and back pain.  Neurological:  Negative for dizziness and headaches.       Objective:    Physical Exam HENT:     Head: Normocephalic.     Right Ear:  External ear normal.     Left Ear: External ear normal.     Nose: No congestion or rhinorrhea.     Mouth/Throat:     Mouth: Mucous membranes are moist.  Cardiovascular:     Rate and Rhythm: Regular rhythm.     Heart sounds: No murmur heard. Pulmonary:     Effort: No respiratory distress.     Breath sounds: Normal breath sounds.  Musculoskeletal:        General: No deformity.     Comments: Less than 2 inch abrasion noted of the right knee No ecchymosis, tenderness or swelling noted Range of motion of the right knee is intact   Neurological:     Mental Status: He is alert.   Heart  BP 122/82   Pulse 98   Ht '5\' 9"'$  (1.753 m)   Wt 220 lb (99.8 kg)   SpO2 90%   BMI 32.49 kg/m  Wt Readings from Last 3 Encounters:  06/28/22 220 lb (99.8 kg)  06/24/22 221 lb (100.2 kg)  06/16/22 222 lb 4 oz (100.8 kg)       Assessment & Plan:  Chronic midline low back pain with left-sided sciatica Assessment & Plan: Chronic condition Flare[place] 2 weeks ago History of 3 back surgeries Denies red flag symptoms Reports lumbar back pain with left lower extremity sciatica Reports minimal relief of his symptoms with tramadol 50 mg as needed Pain is aggravated with movement and is relieved with rest The patient is taking prednisone 1 mg daily as prescribed by his rheumatologist Will treat today with steroid taper Encouraged the patient to stop taking prednisone 1 mg daily while taking Medrol Dosepak and to resume therapy after completion of Medrol Dosepak Patient verbalized understanding Encouraged to continue taking tramadol 50 mg as needed Encouraged heat application to the lower back Encouraged to follow-up with orthopedics for worsening  symptoms    Falls, initial encounter Assessment & Plan: Reports of fall  this morning on 06/28/2022 He reports sleeping on the edge of the bed, rolling out of his bed, falling, and landing on his right shoulder. His right hand was outstretched He complains  of right wrist pain with swelling of the right middle ring finger Pain is reported with flexion of the right middle ring finger with no evidence of deformity Sensation is intact of all fingers +2 radial pulse palpated Tenderness with palpation of the right wrist and hand No redness, or warmth with palpation  Will get imaging studies of the right wrist to assess for fracture or dislocation Encouraged use of Tylenol as needed for pain control    Fall, initial encounter -     DG Wrist Complete Right -     methylPREDNISolone; Take as the package instructed  Dispense: 1 each; Refill: 0    Alvira Monday, FNP

## 2022-06-28 NOTE — Patient Instructions (Addendum)
I appreciate the opportunity to provide care to you today!    Follow up: as needed  Conservative managements for Low Back Pain include Heat therapy --  helps reduce muscle spasm Cold Therapy- helps reduce edema   Please continue taking tramadol as needed and follow up with orthopedics   Apply heat to the affected area such as a moist heat pack or a heating pad. Place a towel between your skin and the heat source. Leave the heat on for 20-30 minutes. Remove the heat if your skin turns bright red. This is especially important if you are unable to feel pain, heat, or cold. You may have a greater risk of getting burned. Apply  ice on the painful area. To do this: If you have a removable splint, remove it as told by your health care provider. Put ice in a plastic bag. Place a towel between your skin and the bag or between your splint and the bag. Leave the ice on for 20 minutes, 2-3 times a day.     Please stop by [facility] to get an x-ray of the the right wrist   Please continue to a heart-healthy diet and increase your physical activities. Try to exercise for 67mns at least five times a week.      It was a pleasure to see you and I look forward to continuing to work together on your health and well-being. Please do not hesitate to call the office if you need care or have questions about your care.   Have a wonderful day and week. With Gratitude, GAlvira MondayMSN, FNP-BC

## 2022-06-29 ENCOUNTER — Other Ambulatory Visit: Payer: Self-pay | Admitting: *Deleted

## 2022-06-29 ENCOUNTER — Institutional Professional Consult (permissible substitution) (INDEPENDENT_AMBULATORY_CARE_PROVIDER_SITE_OTHER): Payer: 59 | Admitting: Surgery

## 2022-06-29 ENCOUNTER — Encounter: Payer: Self-pay | Admitting: Surgery

## 2022-06-29 ENCOUNTER — Encounter: Payer: Self-pay | Admitting: *Deleted

## 2022-06-29 VITALS — BP 136/80 | HR 80 | Resp 20 | Ht 69.0 in | Wt 220.0 lb

## 2022-06-29 DIAGNOSIS — I2510 Atherosclerotic heart disease of native coronary artery without angina pectoris: Secondary | ICD-10-CM | POA: Diagnosis not present

## 2022-06-29 NOTE — Progress Notes (Signed)
Cardiothoracic Surgery Consultation  PCP is [name], MD Referring Provider is Jonathan Hampshire, MD  Chief Complaint  Patient presents with   Coronary Artery Disease    Surgical consult, Cardiac Cath 06/24/22/ ECHO 06/10/22    HPI:  The patient is a 77 year old gentleman with a history of type 2 diabetes, hypertension, hyperlipidemia, stage IIIb chronic kidney disease, rheumatoid arthritis on prednisone and methotrexate, peripheral vascular disease with rest pain in his left foot followed by Dr. Virl [name] and coronary artery disease.  He underwent aortoiliac and lower extremity angiography by Dr. Fletcher [name] showing that the tibial and peroneal vessels in the left lower extremity were occluded with reconstitution of the anterior tibial and posterior tibial arteries by collaterals just above the ankle.  He was seen by Dr. Virl [name] for possible bypass but was felt borderline quality vein conduit for distal bypass.  He was also complaining of a few month history of shortness of breath and fatigue as well as substernal chest burning that he thought was heartburn.  This has been occurring after eating but also during exertion such as walking.  2D echocardiogram showed a low normal ejection fraction of 50 to 55% with grade 1 diastolic dysfunction and mild LVH.  There is no significant valvular abnormality.  A nuclear stress test was abnormal with evidence of prior infarction in the apical to basal inferolateral region.  There was no ischemia.  Ejection fraction was 53%.  He subsequently underwent cardiac catheterization on 06/24/2022 showing severe diffuse three-vessel coronary disease.  The patient had 90% ostial LAD stenosis.  The proximal to mid LAD was diffusely diseased with 80% mid vessel stenosis.  There was a diagonal branch that had 80% ostial stenosis.  Left circumflex had 80% ostial to proximal stenosis as well as a 50% mid vessel stenosis.  There were 2 moderate-sized marginal branches.  The right  coronary had 70% proximal PDA stenosis.  Right heart catheterization showed a PA pressure of 56/23 with a mean of 37.  Mean right atrial pressure was 7 mmHg.  Cardiac index was 3.54.  The patient is here today with his wife.  He has not very active due to rheumatoid arthritis as well as his lower extremity pain.  He has never smoked.  His wife reports that he has been sleeping more than usual and frequently falls asleep whenever he sits down.  She feels like he has been getting more short of breath with time. Past Medical History:  Diagnosis Date   Arthritis    ra, sees dr Jonathan [name]   Chronic kidney disease    Coronary artery disease    saw dr Jonathan [name] in [place] 20 yrs ago, released from cardiology   Diabetes mellitus without complication ([place])    type 2   Dyspnea    GERD (gastroesophageal reflux disease)    Hypercholesteremia    Hypertension    Peripheral vascular disease ([place])    Urethral stricture     Past Surgical History:  Procedure Laterality Date   ABDOMINAL AORTOGRAM W/LOWER EXTREMITY N/A 04/20/2022   Procedure: ABDOMINAL AORTOGRAM W/LOWER EXTREMITY;  Surgeon: Jonathan Hampshire, MD;  Location: [facility];  Service: Cardiovascular;  Laterality: N/A;   BACK SURGERY     x 3 lower back   CARDIAC CATHETERIZATION  2000   [name] amt plaque relased from cardiology 20 yrs ago   CHOLECYSTECTOMY     COLONOSCOPY WITH PROPOFOL N/A 10/26/2021   Procedure: COLONOSCOPY WITH PROPOFOL;  Surgeon: Jonathan Harman, DO;  [facility];  Service: Endoscopy;  Laterality: N/A;  To arrive at [facility] N/A 04/17/2019   Procedure: CYSTOSCOPY WITH URETHRAL DILATATION, CYSTOGRAM;  Surgeon: Jonathan Mons, MD;  Location: [facility];  Service: Urology;  Laterality: N/A;   EYE SURGERY Bilateral    cataracts   IR RADIOLOGIST EVAL & MGMT  07/12/2021   PERIPHERAL VASCULAR BALLOON ANGIOPLASTY  04/20/2022   Procedure:  PERIPHERAL VASCULAR BALLOON ANGIOPLASTY;  Surgeon: Jonathan Hampshire, MD;  Location: [facility];  Service: Cardiovascular;;  aborted; unable to cross   POLYPECTOMY  10/26/2021   Procedure: POLYPECTOMY INTESTINAL;  Surgeon: Jonathan Harman, DO;  [facility]: [facility];  Service: Endoscopy;;   RIGHT/LEFT HEART CATH AND CORONARY ANGIOGRAPHY Bilateral 06/24/2022   Procedure: RIGHT/LEFT HEART CATH AND CORONARY ANGIOGRAPHY;  Surgeon: Jonathan Hampshire, MD;  Location: [facility];  Service: Cardiovascular;  Laterality: Bilateral;   urethral stricture surgery  15 yrs ago    Family History  Problem Relation Age of Onset   Heart Problems Father     Social History Social History   Tobacco Use   Smoking status: Never   Smokeless tobacco: Never  Vaping Use   Vaping Use: Never used  Substance Use Topics   Alcohol use: No   Drug use: No    Current Outpatient Medications  Medication Sig Dispense Refill   albuterol (VENTOLIN HFA) 108 (90 Base) MCG/ACT inhaler Inhale 1-2 puffs into the lungs every 6 (six) hours as needed for wheezing or shortness of breath.     aspirin EC 81 MG tablet Take 81 mg by mouth daily. Swallow whole.     cholecalciferol (VITAMIN D3) 25 MCG (1000 UNIT) tablet Take 1,000 Units by mouth daily.     cyanocobalamin (VITAMIN B12) 1000 MCG tablet Take 1,000 mcg by mouth daily.     finasteride (PROSCAR) 5 MG tablet TAKE 1 TABLET BY MOUTH DAILY 123XX123 tablet 2   folic acid (FOLVITE) 1 MG tablet Take 1 mg by mouth daily.     gabapentin (NEURONTIN) 300 MG capsule Take 1 capsule (300 mg total) by mouth at bedtime. 30 capsule 5   glipiZIDE (GLUCOTROL) 5 MG tablet TAKE 1 TABLET BY MOUTH TWICE DAILY BEFORE A MEAL 30 tablet 0   hydrochlorothiazide (HYDRODIURIL) 25 MG tablet Take 1 tablet (25 mg total) by mouth daily. 90 tablet 0   linaclotide (LINZESS) 145 MCG CAPS capsule Take 145 mcg by mouth daily as needed.     losartan (COZAAR) 100 MG tablet Take 1 tablet (100 mg  total) by mouth daily. 90 tablet 0   Menthol, Topical Analgesic, (BLUE-EMU MAXIMUM STRENGTH EX) Apply 1 Application topically daily as needed (pain).     methotrexate (RHEUMATREX) 2.5 MG tablet Take 12.5 mg by mouth every Sunday. Caution:Chemotherapy. Protect from light. Take 5 tablets PO once weekly.     metoprolol tartrate (LOPRESSOR) 50 MG tablet TAKE 1 TABLET BY MOUTH TWICE  DAILY 200 tablet 2   omeprazole (PRILOSEC) 20 MG capsule Take 1 capsule (20 mg total) by mouth daily. 30 capsule 5   OVER THE COUNTER MEDICATION Take 2 tablets by mouth daily. Omega XL (not Fish oil)     predniSONE (DELTASONE) 1 MG tablet Take 1 mg by mouth daily with breakfast.     rosuvastatin (CRESTOR) 10 MG tablet TAKE 1 TABLET BY MOUTH DAILY 90 tablet 3   silodosin (RAPAFLO)  8 MG CAPS capsule Take 1 capsule (8 mg total) by mouth in the morning and at bedtime. 200 capsule 2   traMADol (ULTRAM) 50 MG tablet Take by mouth 2 (two) times daily as needed.     triamcinolone cream (KENALOG) 0.1 % Apply 1 Application topically daily as needed (itching). 80 g 1   vitamin E 180 MG (400 UNITS) capsule Take 400 Units by mouth daily.     hydrocortisone (ANUSOL-HC) 2.5 % rectal cream Place 1 Application rectally 2 (two) times daily. For 10 days. May repeat cycle if needed. (Patient not taking: Reported on 06/29/2022) 30 g 1   methylPREDNISolone (MEDROL DOSEPAK) 4 MG TBPK tablet Take as the package instructed (Patient not taking: Reported on 06/29/2022) 1 each 0   No current facility-administered medications for this visit.    Allergies  Allergen Reactions   Ciprofloxacin Itching   Mosquito (Diagnostic) Itching   Oxycodone-Acetaminophen Rash    Review of Systems  Constitutional:  Positive for activity change and fatigue.  HENT: Negative.    Eyes: Negative.   Respiratory:  Positive for shortness of breath.   Cardiovascular:  Positive for chest pain and leg swelling.  Gastrointestinal: Negative.   Endocrine: Negative.    Genitourinary: Negative.   Musculoskeletal:  Positive for arthralgias.  Skin: Negative.   Allergic/Immunologic: Negative.   Neurological:  Negative for dizziness and syncope.  Hematological: Negative.   Psychiatric/Behavioral: Negative.      BP 136/80   Pulse 80   Resp 20   Ht 5' 9"$  (1.753 m)   Wt 220 lb (99.8 kg)   BMI 32.49 kg/m  Physical Exam Constitutional:      Appearance: Normal appearance. He is obese.  HENT:     Head: Normocephalic and atraumatic.  Eyes:     Extraocular Movements: Extraocular movements intact.     Conjunctiva/sclera: Conjunctivae normal.     Pupils: Pupils are equal, round, and reactive to light.  Neck:     Vascular: No carotid bruit.  Cardiovascular:     Rate and Rhythm: Normal rate and regular rhythm.     Heart sounds: Normal heart sounds. No murmur heard. Pulmonary:     Effort: Pulmonary effort is normal.     Breath sounds: Normal breath sounds.  Abdominal:     General: Bowel sounds are normal. There is no distension.     Tenderness: There is no abdominal tenderness.  Musculoskeletal:        General: Swelling present.  Skin:    General: Skin is warm and dry.  Neurological:     General: No focal deficit present.     Mental Status: He is alert and oriented to person, place, and time.  Psychiatric:        Mood and Affect: Mood normal.        Behavior: Behavior normal.      Diagnostic Tests:  ECHOCARDIOGRAM REPORT       Patient Name:   Jonathan [name] Date of Exam: 06/10/2022  Medical Rec #:  [id number]        Height:       69.0 in  Accession #:    [id number]       Weight:       223.0 lb  Date of Birth:  June 14, 1945         BSA:          2.164 m  Patient Age:    54 years  BP:           152/79 mmHg  Patient Gender: M                HR:           64 bpm.  Exam Location:  [place]   Procedure: 2D Echo, Cardiac Doppler and Color Doppler   Indications:    R06.00 Dyspnea    History:        Patient has no prior history of  Echocardiogram  examinations.                 PAD, Signs/Symptoms:Fatigue; Risk Factors:Hypertension,  Diabetes                 and Dyslipidemia. Lower extremity edema. Chronic kidney  disease.                 Obesity.    Sonographer:    Diamond Nickel RCS  Referring Phys: [place]     1. Left ventricular ejection fraction, by estimation, is 50 to 55%. The  left ventricle has low normal function. The left ventricle has no regional  wall motion abnormalities. There is mild left ventricular hypertrophy of  the basal-septal segment. Left  ventricular diastolic parameters are consistent with Grade I diastolic  dysfunction (impaired relaxation).   2. Right ventricular systolic function is normal. The right ventricular  size is normal.   3. The mitral valve is normal in structure. Mild mitral valve  regurgitation. No evidence of mitral stenosis.   4. The aortic valve is tricuspid. Aortic valve regurgitation is not  visualized. Aortic valve sclerosis is present, with no evidence of aortic  valve stenosis.   5. The inferior vena cava is normal in size with greater than 50%  respiratory variability, suggesting right atrial pressure of 3 mmHg.   Comparison(s): No prior Echocardiogram.   FINDINGS   Left Ventricle: Left ventricular ejection fraction, by estimation, is 50  to 55%. The left ventricle has low normal function. The left ventricle has  no regional wall motion abnormalities. The left ventricular internal  cavity size was normal in size.  There is mild left ventricular hypertrophy of the basal-septal segment.  Left ventricular diastolic parameters are consistent with Grade I  diastolic dysfunction (impaired relaxation).   Right Ventricle: The right ventricular size is normal. Right ventricular  systolic function is normal.   Left Atrium: Left atrial size was normal in size.   Right Atrium: Right atrial size was normal in size.   Pericardium:  There is no evidence of pericardial effusion.   Mitral Valve: The mitral valve is normal in structure. Mild mitral valve  regurgitation. No evidence of mitral valve stenosis.   Tricuspid Valve: The tricuspid valve is normal in structure. Tricuspid  valve regurgitation is trivial. No evidence of tricuspid stenosis.   Aortic Valve: The aortic valve is tricuspid. Aortic valve regurgitation is  not visualized. Aortic valve sclerosis is present, with no evidence of  aortic valve stenosis.   Pulmonic Valve: The pulmonic valve was normal in structure. Pulmonic valve  regurgitation is trivial. No evidence of pulmonic stenosis.   Aorta: The aortic root is normal in size and structure.   Venous: The inferior vena cava is normal in size with greater than 50%  respiratory variability, suggesting right atrial pressure of 3 mmHg.   IAS/Shunts: No atrial level shunt detected by color flow Doppler.     LEFT VENTRICLE  PLAX 2D  LVIDd:         3.60 cm   Diastology  LVIDs:         2.15 cm   LV e' medial:    5.00 cm/s  LV PW:         1.00 cm   LV E/e' medial:  11.1  LV IVS:        1.00 cm   LV e' lateral:   8.27 cm/s  LVOT diam:     2.10 cm   LV E/e' lateral: 6.7  LV SV:         62  LV SV Index:   29  LVOT Area:     3.46 cm     RIGHT VENTRICLE  RV Basal diam:  3.20 cm  RV S prime:     8.81 cm/s  TAPSE (M-mode): 1.8 cm  RVSP:           23.8 mmHg   LEFT ATRIUM             Index        RIGHT ATRIUM           Index  LA diam:        3.80 cm 1.76 cm/m   RA Pressure: 3.00 mmHg  LA Vol (A2C):   49.4 ml 22.83 ml/m  RA Area:     13.90 cm  LA Vol (A4C):   38.5 ml 17.79 ml/m  RA Volume:   31.70 ml  14.65 ml/m  LA Biplane Vol: 43.9 ml 20.29 ml/m   AORTIC VALVE  LVOT Vmax:   79.00 cm/s  LVOT Vmean:  46.800 cm/s  LVOT VTI:    0.179 m    AORTA  Ao Root diam: 3.30 cm  Ao Asc diam:  3.40 cm   MITRAL VALVE               TRICUSPID VALVE  MV Area (PHT): 2.97 cm    TR Peak grad:   20.8 mmHg   MV Decel Time: 255 msec    TR Vmax:        228.00 cm/s  MV E velocity: 55.50 cm/s  Estimated RAP:  3.00 mmHg  MV A velocity: 73.90 cm/s  RVSP:           23.8 mmHg  MV E/A ratio:  0.75                             SHUNTS                             Systemic VTI:  0.18 m                             Systemic Diam: 2.10 cm   Kirk Ruths MD  Electronically signed by Kirk Ruths MD  Signature Date/Time: 06/10/2022/9:33:40 AM        Final       Physicians  Panel Physicians Referring Physician Case Authorizing Physician  Jonathan Hampshire, MD (Primary)     Procedures  RIGHT/LEFT HEART CATH AND CORONARY ANGIOGRAPHY   Conclusion      Ost RCA to Prox RCA lesion is 30% stenosed.   RPDA lesion is 70% stenosed.   3rd RPL lesion is 100% stenosed.   Ost LAD lesion is 90% stenosed.  Ost Cx to Prox Cx lesion is 80% stenosed.   Mid Cx to Dist Cx lesion is 50% stenosed.   Mid LAD-2 lesion is 80% stenosed.   2nd Diag lesion is 85% stenosed.   Mid LAD-1 lesion is 50% stenosed.   1st Diag lesion is 80% stenosed.   Mid LM to Dist LM lesion is 20% stenosed.   1.  Severe diffuse three-vessel coronary artery disease with involvement of ostial LAD and ostial left circumflex. 2.  Left ventricular angiography was not performed due to chronic kidney disease.  EF was mildly reduced by echo. 3.  Right heart catheterization showed normal RA pressure, moderate pulmonary hypertension and normal cardiac output.   RA: 7 mmHg RV: 55/1 mmHg PA: 56/23 with a mean of 37 mmHg Pulmonary wedge pressure: 23 mmHg with prominent V wave suggestive of mitral regurgitation. Cardiac output is 7.63 with an index of 3.54.   Recommendations: Given three-vessel coronary artery disease, diabetic status and diffuse disease, recommend evaluation for CABG.   Indications  Chronic systolic heart failure (HCC) [I50.22 (ICD-10-CM)]  Abnormal stress test [id number] (ICD-10-CM)]   Procedural Details  Technical  Details Procedural Details: The right antecubital area was prepped in a sterile fashion.  Ultrasound was used to identify the right brachial vein.  Under direct ultrasound visualization, a 5 French slender sheath was placed.  Ultrasound guidance was also used for right radial artery access.  The right wrist was prepped, draped, and anesthetized with 1% lidocaine. Using the modified Seldinger technique, a 5/6 French sheath was introduced into the right radial artery. 2.5 mg of verapamil was administered through the sheath, weight-based unfractionated heparin was administered intravenously. Right heart catheterization was performed using a 5 French Swan-Ganz catheter. Cardiac output was calculated by the Fick method.  I initially used a Jacky catheter but the innominate artery was extremely tortuous and could not engage the coronary arteries.  Thus, I elected to exchange the sheath into an 85 cm radial destination sheath.  I then used standard Judkins catheters to engage the coronary arteries.  The Jacky catheter was used to cross the aortic valve and record LV pressure with pullback.  Left ventricular angiography was not performed due to chronic kidney disease.Catheter exchanges were performed over an exchange length guidewire. There were no immediate procedural complications. A TR band was used for radial hemostasis at the completion of the procedure.  The patient was transferred to the post catheterization recovery area for further monitoring.   Estimated blood loss <50 mL.   During this procedure medications were administered to achieve and maintain moderate conscious sedation while the patient's heart rate, blood pressure, and oxygen saturation were continuously monitored and I was present face-to-face 100% of this time.   Medications (Filter: Administrations occurring from 1328 to 1456 on 06/24/22)  important  Continuous medications are totaled by the amount administered until 06/24/22 1456.    fentaNYL (SUBLIMAZE) injection (mcg)  Total dose: 25 mcg Date/Time Rate/Dose/Volume Action   06/24/22 1354 25 mcg Given   midazolam (VERSED) injection (mg)  Total dose: 1 mg Date/Time Rate/Dose/Volume Action   06/24/22 1355 1 mg Given   verapamil (ISOPTIN) injection (mg)  Total dose: 5 mg Date/Time Rate/Dose/Volume Action   06/24/22 1404 2.5 mg Given   1417 2.5 mg Given   Heparin (Porcine) in NaCl 2000-0.9 UNIT/L-% SOLN (mL)  Total volume: 1,000 mL Date/Time Rate/Dose/Volume Action   06/24/22 1404 1,000 mL Given   heparin sodium (porcine) injection (Units)  Total dose: 7,000  Units Date/Time Rate/Dose/Volume Action   06/24/22 1412 5,000 Units Given   1419 2,000 Units Given   iohexol (OMNIPAQUE) 300 MG/ML solution (mL)  Total volume: 33 mL Date/Time Rate/Dose/Volume Action   06/24/22 1451 33 mL Given    Sedation Time  Sedation Time Physician-1: 52 minutes 30 seconds Contrast  Medication Name Total Dose  iohexol (OMNIPAQUE) 300 MG/ML solution 33 mL   Radiation/Fluoro  Fluoro time: 10.5 (min) DAP: 31.7 (Gycm2) Cumulative Air Kerma: 99991111 (mGy) Complications  Complications documented before study signed (06/24/2022  [id number] PM)   No complications were associated with this study.  Documented by Arletha Grippe, RT - 06/24/2022  2:48 PM     Coronary Findings  Diagnostic Dominance: Right Left Main  Mid LM to Dist LM lesion is 20% stenosed.    Left Anterior Descending  Ost LAD lesion is 90% stenosed.  Mid LAD-1 lesion is 50% stenosed.  Mid LAD-2 lesion is 80% stenosed.    First Diagonal Branch  Vessel is [name] in size.  1st Diag lesion is 80% stenosed.    Second Diagonal Branch  2nd Diag lesion is 85% stenosed.    Third Diagonal Branch  Vessel is [name] in size.    Left Circumflex  Ost Cx to Prox Cx lesion is 80% stenosed.  Mid Cx to Dist Cx lesion is 50% stenosed.    First Obtuse Marginal Branch  Vessel is moderate in size. Vessel is angiographically  normal.    Second Obtuse Marginal Branch  Vessel is moderate in size. Vessel is angiographically normal.    Third Obtuse Marginal Branch  Vessel is [name] in size. Vessel is angiographically normal.    Right Coronary Artery  Ost RCA to Prox RCA lesion is 30% stenosed.    Right Posterior Descending Artery  RPDA lesion is 70% stenosed.    First Right Posterolateral Branch  Vessel is [name] in size.    Third Right Posterolateral Branch  Collaterals  3rd RPL filled by collaterals from Dist LAD.    3rd RPL lesion is 100% stenosed.    Intervention   No interventions have been documented.   Coronary Diagrams  Diagnostic Dominance: Right  Intervention   Impression:  This 77 year old gentleman has severe multivessel coronary disease with low normal left ventricular ejection fraction and worsening symptoms of exertional substernal chest discomfort, shortness of breath, and fatigue.  I agree that coronary artery bypass graft surgery is indicated for relief of his symptoms and to prevent myocardial loss.  His operative risk is increased due to stage III chronic kidney disease with a creatinine of 1.63 and severe lower extremity vascular disease with rest pain in his left foot. I discussed the operative procedure with the patient and his wife including alternatives, benefits and risks; including but not limited to bleeding, blood transfusion, infection, stroke, myocardial infarction, graft failure, heart block requiring a permanent pacemaker, organ dysfunction, and death.  Jejuan L Mattila understands and agrees to proceed.    Plan:  He will be scheduled for coronary bypass graft surgery on Thursday, 07/07/2022.  I asked him to discontinue methotrexate at this time and that can be resumed a few weeks postoperatively.  He has been on low-dose prednisone and therefore we will give him perioperative stress dose steroids.  I spent 60 minutes performing this consultation and > 50% of this time  was spent face to face counseling and coordinating the care of this patient's severe multivessel coronary disease.   Gaye Pollack, MD [facility] [phone]

## 2022-07-01 DIAGNOSIS — R0600 Dyspnea, unspecified: Secondary | ICD-10-CM

## 2022-07-01 HISTORY — DX: Dyspnea, unspecified: R06.00

## 2022-07-05 ENCOUNTER — Other Ambulatory Visit: Payer: Self-pay

## 2022-07-05 ENCOUNTER — Ambulatory Visit (HOSPITAL_COMMUNITY)
Admission: RE | Admit: 2022-07-05 | Discharge: 2022-07-05 | Disposition: A | Discharge status: Home / Self Care | Payer: 59 | Source: Ambulatory Visit | Attending: Surgery | Admitting: Surgery

## 2022-07-05 ENCOUNTER — Ambulatory Visit (HOSPITAL_BASED_OUTPATIENT_CLINIC_OR_DEPARTMENT_OTHER)
Admission: RE | Admit: 2022-07-05 | Discharge: 2022-07-05 | Disposition: A | Discharge status: Home / Self Care | Payer: 59 | Source: Ambulatory Visit | Attending: Surgery | Admitting: Surgery

## 2022-07-05 ENCOUNTER — Encounter (HOSPITAL_COMMUNITY): Payer: Self-pay

## 2022-07-05 ENCOUNTER — Encounter (HOSPITAL_COMMUNITY)
Admission: RE | Admit: 2022-07-05 | Discharge: 2022-07-05 | Disposition: A | Discharge status: Home / Self Care | Payer: 59 | Source: Ambulatory Visit | Attending: Surgery | Admitting: Surgery

## 2022-07-05 VITALS — BP 154/69 | HR 59 | Temp 97.8°F | Resp 18 | Ht 69.0 in | Wt 218.0 lb

## 2022-07-05 DIAGNOSIS — E119 Type 2 diabetes mellitus without complications: Secondary | ICD-10-CM | POA: Insufficient documentation

## 2022-07-05 DIAGNOSIS — I779 Disorder of arteries and arterioles, unspecified: Secondary | ICD-10-CM | POA: Insufficient documentation

## 2022-07-05 DIAGNOSIS — I10 Essential (primary) hypertension: Secondary | ICD-10-CM | POA: Insufficient documentation

## 2022-07-05 DIAGNOSIS — I2510 Atherosclerotic heart disease of native coronary artery without angina pectoris: Secondary | ICD-10-CM

## 2022-07-05 DIAGNOSIS — E785 Hyperlipidemia, unspecified: Secondary | ICD-10-CM | POA: Insufficient documentation

## 2022-07-05 DIAGNOSIS — I739 Peripheral vascular disease, unspecified: Secondary | ICD-10-CM | POA: Insufficient documentation

## 2022-07-05 DIAGNOSIS — Z01818 Encounter for other preprocedural examination: Secondary | ICD-10-CM

## 2022-07-05 DIAGNOSIS — Z1152 Encounter for screening for COVID-19: Secondary | ICD-10-CM | POA: Insufficient documentation

## 2022-07-05 LAB — URINALYSIS, ROUTINE W REFLEX MICROSCOPIC
Bilirubin Urine: NEGATIVE
Glucose, UA: NEGATIVE mg/dL
Ketones, ur: NEGATIVE mg/dL
Leukocytes,Ua: NEGATIVE
Nitrite: NEGATIVE
Protein, ur: NEGATIVE mg/dL
Specific Gravity, Urine: 1.011 (ref 1.005–1.030)
pH: 5 (ref 5.0–8.0)

## 2022-07-05 LAB — COMPREHENSIVE METABOLIC PANEL
ALT: 24 U/L (ref 0–44)
AST: 30 U/L (ref 15–41)
Albumin: 3 g/dL — ABNORMAL LOW (ref 3.5–5.0)
Alkaline Phosphatase: 80 U/L (ref 38–126)
Anion gap: 9 (ref 5–15)
BUN: 26 mg/dL — ABNORMAL HIGH (ref 8–23)
CO2: 25 mmol/L (ref 22–32)
Calcium: 9.2 mg/dL (ref 8.9–10.3)
Chloride: 101 mmol/L (ref 98–111)
Creatinine, Ser: 1.59 mg/dL — ABNORMAL HIGH (ref 0.61–1.24)
GFR, Estimated: 45 mL/min — ABNORMAL LOW (ref >60)
Glucose, Bld: 110 mg/dL — ABNORMAL HIGH (ref 70–99)
Potassium: 4.7 mmol/L (ref 3.5–5.1)
Sodium: 135 mmol/L (ref 135–145)
Total Bilirubin: 0.7 mg/dL (ref 0.3–1.2)
Total Protein: 7.7 g/dL (ref 6.5–8.1)

## 2022-07-05 LAB — APTT: aPTT: 30 seconds (ref 24–36)

## 2022-07-05 LAB — SURGICAL PCR SCREEN
MRSA, PCR: NEGATIVE
Staphylococcus aureus: NEGATIVE

## 2022-07-05 LAB — CBC
HCT: 42.5 % (ref 39.0–52.0)
Hemoglobin: 13.1 g/dL (ref 13.0–17.0)
MCH: 29.3 pg (ref 26.0–34.0)
MCHC: 30.8 g/dL (ref 30.0–36.0)
MCV: 95.1 fL (ref 80.0–100.0)
Platelets: 205 10*3/uL (ref 150–400)
RBC: 4.47 MIL/uL (ref 4.22–5.81)
RDW: 15.9 % — ABNORMAL HIGH (ref 11.5–15.5)
WBC: 8.4 10*3/uL (ref 4.0–10.5)
nRBC: 0 % (ref 0.0–0.2)

## 2022-07-05 LAB — GLUCOSE, CAPILLARY
Glucose-Capillary: 60 mg/dL — ABNORMAL LOW (ref 70–99)
Glucose-Capillary: 112 mg/dL — ABNORMAL HIGH (ref 70–99)
Glucose-Capillary: 46 mg/dL — ABNORMAL LOW (ref 70–99)

## 2022-07-05 LAB — VAS US DOPPLER PRE CABG
Left ABI: 0.63
Right ABI: 0.59

## 2022-07-05 LAB — PROTIME-INR
INR: 1.1 (ref 0.8–1.2)
Prothrombin Time: 13.7 seconds (ref 11.4–15.2)

## 2022-07-05 NOTE — Pre-Procedure Instructions (Signed)
Surgical Instructions    Your procedure is scheduled on July 07, 2022.  Report to [facility] Main Entrance "A" at 5:30 A.M., then check in with the Admitting office.  Call this number if you have problems the morning of surgery:  [phone]  If you have any questions prior to your surgery date call [phone]: Open Monday-Friday 8am-4pm If you experience any cold or flu symptoms such as cough, fever, chills, shortness of breath, etc. between now and your scheduled surgery, please notify us at the above number.     Remember:  Do not eat or drink after midnight the night before your surgery      Take these medicines the morning of surgery with A SIP OF WATER:  finasteride (PROSCAR)   metoprolol tartrate (LOPRESSOR)   omeprazole (PRILOSEC)   predniSONE (DELTASONE)   rosuvastatin (CRESTOR)   silodosin (RAPAFLO)    May take these medicines IF NEEDED:  albuterol (VENTOLIN HFA) inhaler   linaclotide (LINZESS)   traMADol (ULTRAM)    Continue to take your Aspirin through the day before surgery. DO NOT take any the morning of surgery.   As of today, STOP taking any Aleve, Naproxen, Ibuprofen, Motrin, Advil, Goody's, BC's, all herbal medications, fish oil, and all vitamins.   WHAT DO I DO ABOUT MY DIABETES MEDICATION?   THE NIGHT BEFORE SURGERY, DO NOT take your evening dose of glipiZIDE (GLUCOTROL).     THE MORNING OF SURGERY, Do not take glipiZIDE (GLUCOTROL) the morning of surgery.    HOW TO MANAGE YOUR DIABETES BEFORE AND AFTER SURGERY  Why is it important to control my blood sugar before and after surgery? Improving blood sugar levels before and after surgery helps healing and can limit problems. A way of improving blood sugar control is eating a healthy diet by:  Eating less sugar and carbohydrates  Increasing activity/exercise  Talking with your doctor about reaching your blood sugar goals High blood sugars (greater than 180 mg/dL) can raise your risk of  infections and slow your recovery, so you will need to focus on controlling your diabetes during the weeks before surgery. Make sure that the doctor who takes care of your diabetes knows about your planned surgery including the date and location.  How do I manage my blood sugar before surgery? Check your blood sugar at least 4 times a day, starting 2 days before surgery, to make sure that the level is not too high or low.  Check your blood sugar the morning of your surgery when you wake up and every 2 hours until you get to the Short Stay unit.  If your blood sugar is less than 70 mg/dL, you will need to treat for low blood sugar: Do not take insulin. Treat a low blood sugar (less than 70 mg/dL) with  cup of clear juice (cranberry or apple), 4 glucose tablets, OR glucose gel. Recheck blood sugar in 15 minutes after treatment (to make sure it is greater than 70 mg/dL). If your blood sugar is not greater than 70 mg/dL on recheck, call [phone] for further instructions. Report your blood sugar to the short stay nurse when you get to Short Stay.  If you are admitted to the hospital after surgery: Your blood sugar will be checked by the staff and you will probably be given insulin after surgery (instead of oral diabetes medicines) to make sure you have good blood sugar levels. The goal for blood sugar control after surgery is 80-180 mg/dL.  Do NOT Smoke (Tobacco/Vaping) for 24 hours prior to your procedure.  If you use a CPAP at night, you may bring your mask/headgear for your overnight stay.   Contacts, glasses, piercing's, hearing aid's, dentures or partials may not be worn into surgery, please bring cases for these belongings.    For patients admitted to the hospital, discharge time will be determined by your treatment team.   Patients discharged the day of surgery will not be allowed to drive home, and someone needs to stay with them for 24 hours.  SURGICAL  WAITING ROOM VISITATION Patients having surgery or a procedure may have no more than 2 support people in the waiting area - these visitors may rotate.   Children under the age of 20 must have an adult with them who is not the patient. If the patient needs to stay at the hospital during part of their recovery, the visitor guidelines for inpatient rooms apply. Pre-op nurse will coordinate an appropriate time for 1 support person to accompany patient in pre-op.  This support person may not rotate.   Please refer to the [facility] website for the visitor guidelines for Inpatients (after your surgery is over and you are in a regular room).    Special instructions:   [place]- Preparing For Surgery  Before surgery, you can play an important role. Because skin is not sterile, your skin needs to be as free of germs as possible. You can reduce the number of germs on your skin by washing with CHG (chlorahexidine gluconate) Soap before surgery.  CHG is an antiseptic cleaner which kills germs and bonds with the skin to continue killing germs even after washing.    Oral Hygiene is also important to reduce your risk of infection.  Remember - BRUSH YOUR TEETH THE MORNING OF SURGERY WITH YOUR REGULAR TOOTHPASTE  Please do not use if you have an allergy to CHG or antibacterial soaps. If your skin becomes reddened/irritated stop using the CHG.  Do not shave (including legs and underarms) for at least 48 hours prior to first CHG shower. It is OK to shave your face.  Please follow these instructions carefully.   Shower the NIGHT BEFORE SURGERY and the MORNING OF SURGERY  If you chose to wash your hair, wash your hair first as usual with your normal shampoo.  After you shampoo, rinse your hair and body thoroughly to remove the shampoo.  Use CHG Soap as you would any other liquid soap. You can apply CHG directly to the skin and wash gently with a scrungie or a clean washcloth.   Apply the CHG Soap to your  body ONLY FROM THE NECK DOWN.  Do not use on open wounds or open sores. Avoid contact with your eyes, ears, mouth and genitals (private parts). Wash Face and genitals (private parts)  with your normal soap.   Wash thoroughly, paying special attention to the area where your surgery will be performed.  Thoroughly rinse your body with warm water from the neck down.  DO NOT shower/wash with your normal soap after using and rinsing off the CHG Soap.  Pat yourself dry with a CLEAN TOWEL.  Wear CLEAN PAJAMAS to bed the night before surgery  Place CLEAN SHEETS on your bed the night before your surgery  DO NOT SLEEP WITH PETS.   Day of Surgery: Take a shower with CHG soap. Do not wear jewelry or makeup Do not wear lotions, powders, perfumes/colognes, or deodorant. Do not shave 48  hours prior to surgery.  Men may shave face and neck. Do not bring valuables to the hospital.  [facility] is not responsible for any belongings or valuables. Do not wear nail polish, gel polish, artificial nails, or any other type of covering on natural nails (fingers and toes) If you have artificial nails or gel coating that need to be removed by a nail salon, please have this removed prior to surgery. Artificial nails or gel coating may interfere with anesthesia's ability to adequately monitor your vital signs.  Wear Clean/Comfortable clothing the morning of surgery Remember to brush your teeth WITH YOUR REGULAR TOOTHPASTE.   Please read over the following fact sheets that you were given.    If you received a COVID test during your pre-op visit  it is requested that you wear a mask when out in public, stay away from anyone that may not be feeling well and notify your surgeon if you develop symptoms. If you have been in contact with anyone that has tested positive in the last 10 days please notify you surgeon.

## 2022-07-05 NOTE — Progress Notes (Addendum)
PCP - Dr. [name] Cardiologist - Dr. Kathlyn [place]  PPM/ICD - Denies Device Orders - n/a Rep Notified - n/a  Chest x-ray - 07/05/2022 EKG - 06/20/2022 Stress Test - 06/10/2022 ECHO - 06/10/2022 Cardiac Cath - 06/24/2022  Sleep Study - Denies CPAP - n/a  Pt is DM2. He checks his blood sugar 1-2x/week. Normal fasting blood sugar is around 90. CBG at pre-op 60 at 1526. All pt has had today was toast with jelly, and coffee with cream and sugar. Pt was given 1/2 cup apple juice and CBG at 1549 was 46. Pt was given 1/2 cup of orange juice, graham crackers and peanut butter. CBG at 1611 was 112. Pt states that he is not low everyday, but he does have hypoglycemic episodes at least once per week. He normally feels "shaky" when his blood sugar is low, but today pt does not endorse any symptoms during either hypoglycemic event.  RN instructed pt to recheck his blood sugar when he got home (he lives about 30 min from hospital). Pt understood instructions.  Last dose of GLP1 agonist- n/a GLP1 instructions: n/a  Blood Thinner Instructions: n/a Aspirin Instructions: Continue to take ASA through day before surgery. Do not take any morning of surgery  NPO after midnight  COVID TEST- Yes. Result pending   Anesthesia review: Yes. Two hypoglycemic events during PAT visit. Pt also endorses mild chronic kidney disease at visit. Discussed with Myra Gianotti, PA-C   Patient denies shortness of breath, fever, cough and chest pain at PAT appointment. Pt denies any respiratory illness/infection in the past two months.   All instructions explained to the patient, with a verbal understanding of the material. Patient agrees to go over the instructions while at home for a better understanding. Patient also instructed to self quarantine after being tested for COVID-19. The opportunity to ask questions was provided.

## 2022-07-06 LAB — SARS CORONAVIRUS 2 (TAT 6-24 HRS): SARS Coronavirus 2: NEGATIVE

## 2022-07-06 LAB — HEMOGLOBIN A1C
Hgb A1c MFr Bld: 6.1 % — ABNORMAL HIGH (ref 4.8–5.6)
Mean Plasma Glucose: 128 mg/dL

## 2022-07-06 MED ORDER — MAGNESIUM SULFATE 50 % IJ SOLN
40.0000 meq | INTRAMUSCULAR | Status: DC
  Filled 2022-07-06: qty 9.85

## 2022-07-06 MED ORDER — TRANEXAMIC ACID (OHS) PUMP PRIME SOLUTION
2.0000 mg/kg | INTRAVENOUS | Status: DC
  Filled 2022-07-06: qty 1.98

## 2022-07-06 MED ORDER — PLASMA-LYTE A IV SOLN
INTRAVENOUS | Status: DC
  Filled 2022-07-06: qty 2.5

## 2022-07-06 MED ORDER — TRANEXAMIC ACID 1000 MG/10ML IV SOLN
1.5000 mg/kg/h | INTRAVENOUS | Status: AC
  Administered 2022-07-07: 1.5 mg/kg/h via INTRAVENOUS
  Filled 2022-07-06: qty 25

## 2022-07-06 MED ORDER — MILRINONE LACTATE IN DEXTROSE 20-5 MG/100ML-% IV SOLN
0.3000 ug/kg/min | INTRAVENOUS | Status: DC
  Filled 2022-07-06: qty 100

## 2022-07-06 MED ORDER — NOREPINEPHRINE 4 MG/250ML-% IV SOLN
0.0000 ug/min | INTRAVENOUS | Status: DC
  Filled 2022-07-06: qty 250

## 2022-07-06 MED ORDER — EPINEPHRINE HCL 5 MG/250ML IV SOLN IN NS
0.0000 ug/min | INTRAVENOUS | Status: DC
  Filled 2022-07-06: qty 250

## 2022-07-06 MED ORDER — HEPARIN 30,000 UNITS/1000 ML (OHS) CELLSAVER SOLUTION
Status: DC
  Filled 2022-07-06: qty 1000

## 2022-07-06 MED ORDER — VANCOMYCIN HCL 1500 MG/300ML IV SOLN
1500.0000 mg | INTRAVENOUS | Status: AC
  Administered 2022-07-07: 1500 mg via INTRAVENOUS
  Filled 2022-07-06: qty 300

## 2022-07-06 MED ORDER — CEFAZOLIN SODIUM-DEXTROSE 2-4 GM/100ML-% IV SOLN
2.0000 g | INTRAVENOUS | Status: AC
  Administered 2022-07-07: 2 g via INTRAVENOUS
  Filled 2022-07-06: qty 100

## 2022-07-06 MED ORDER — INSULIN REGULAR(HUMAN) IN NACL 100-0.9 UT/100ML-% IV SOLN
INTRAVENOUS | Status: AC
  Administered 2022-07-07: 3.8 [IU]/h via INTRAVENOUS
  Filled 2022-07-06: qty 100

## 2022-07-06 MED ORDER — PHENYLEPHRINE HCL-NACL 20-0.9 MG/250ML-% IV SOLN
30.0000 ug/min | INTRAVENOUS | Status: AC
  Administered 2022-07-07: 20 ug/min via INTRAVENOUS
  Filled 2022-07-06: qty 250

## 2022-07-06 MED ORDER — TRANEXAMIC ACID (OHS) BOLUS VIA INFUSION
15.0000 mg/kg | INTRAVENOUS | Status: AC
  Administered 2022-07-07: 1483.5 mg via INTRAVENOUS
  Filled 2022-07-06: qty 1484

## 2022-07-06 MED ORDER — POTASSIUM CHLORIDE 2 MEQ/ML IV SOLN
80.0000 meq | INTRAVENOUS | Status: DC
  Filled 2022-07-06: qty 40

## 2022-07-06 MED ORDER — NITROGLYCERIN IN D5W 200-5 MCG/ML-% IV SOLN
2.0000 ug/min | INTRAVENOUS | Status: DC
  Filled 2022-07-06: qty 250

## 2022-07-06 MED ORDER — DEXMEDETOMIDINE HCL IN NACL 400 MCG/100ML IV SOLN
0.1000 ug/kg/h | INTRAVENOUS | Status: AC
  Administered 2022-07-07: .4 ug/kg/h via INTRAVENOUS
  Filled 2022-07-06: qty 100

## 2022-07-06 NOTE — Anesthesia Preprocedure Evaluation (Signed)
Anesthesia Evaluation  Patient identified by MRN, date of birth, ID band Patient awake    Reviewed: Allergy & Precautions, NPO status , Patient's Chart, lab work & pertinent test results, reviewed documented beta blocker date and time   Airway Mallampati: III  TM Distance: >3 FB Neck ROM: Full    Dental  (+) Edentulous Lower, Edentulous Upper   Pulmonary neg pulmonary ROS   Pulmonary exam normal        Cardiovascular hypertension, Pt. on medications and Pt. on home beta blockers + CAD and + Peripheral Vascular Disease  Normal cardiovascular exam     Neuro/Psych CVA, No Residual Symptoms  negative psych ROS   GI/Hepatic Neg liver ROS,GERD  Medicated and Controlled,,  Endo/Other  diabetes, Oral Hypoglycemic Agents    Renal/GU CRFRenal disease     Musculoskeletal  (+) Arthritis ,    Abdominal   Peds  Hematology negative hematology ROS (+)   Anesthesia Other Findings CAD  Reproductive/Obstetrics                             Anesthesia Physical Anesthesia Plan  ASA: 4  Anesthesia Plan: General   Post-op Pain Management:    Induction: Intravenous  PONV Risk Score and Plan: 2 and Ondansetron, Dexamethasone, Midazolam and Treatment may vary due to age or medical condition  Airway Management Planned: Oral ETT  Additional Equipment: Arterial line, CVP, PA Cath, TEE and Ultrasound Guidance Line Placement  Intra-op Plan:   Post-operative Plan: Post-operative intubation/ventilation  Informed Consent: I have reviewed the patients History and Physical, chart, labs and discussed the procedure including the risks, benefits and alternatives for the proposed anesthesia with the patient or authorized representative who has indicated his/her understanding and acceptance.     Dental advisory given  Plan Discussed with: CRNA  Anesthesia Plan Comments:        Anesthesia Quick Evaluation

## 2022-07-06 NOTE — H&P (Signed)
[name]       [name] [address]             [id number]      Cardiothoracic Surgery Admission History and Physical   PCP is [name], MD Referring Provider is Wellington Hampshire, MD       Chief Complaint  Patient presents with   Coronary Artery Disease           HPI:   The patient is a 77 year old gentleman with a history of type 2 diabetes, hypertension, hyperlipidemia, stage IIIb chronic kidney disease, rheumatoid arthritis on prednisone and methotrexate, peripheral vascular disease with rest pain in his left foot followed by Dr. Virl Cagey and coronary artery disease.  He underwent aortoiliac and lower extremity angiography by Dr. Fletcher Anon showing that the tibial and peroneal vessels in the left lower extremity were occluded with reconstitution of the anterior tibial and posterior tibial arteries by collaterals just above the ankle.  He was seen by Dr. Virl Cagey for possible bypass but was felt borderline quality vein conduit for distal bypass.  He was also complaining of a few month history of shortness of breath and fatigue as well as substernal chest burning that he thought was heartburn.  This has been occurring after eating but also during exertion such as walking.  2D echocardiogram showed a low normal ejection fraction of 50 to 55% with grade 1 diastolic dysfunction and mild LVH.  There is no significant valvular abnormality.  A nuclear stress test was abnormal with evidence of prior infarction in the apical to basal inferolateral region.  There was no ischemia.  Ejection fraction was 53%.  He subsequently underwent cardiac catheterization on 06/24/2022 showing severe diffuse three-vessel coronary disease.  The patient had 90% ostial LAD stenosis.  The proximal to mid LAD was diffusely diseased with 80% mid vessel stenosis.  There was a diagonal branch that had 80% ostial stenosis.  Left circumflex had 80% ostial to proximal stenosis as well as a 50% mid  vessel stenosis.  There were 2 moderate-sized marginal branches.  The right coronary had 70% proximal PDA stenosis.  Right heart catheterization showed a PA pressure of 56/23 with a mean of 37.  Mean right atrial pressure was 7 mmHg.  Cardiac index was 3.54.   The patient lives with his wife.  He has not very active due to rheumatoid arthritis as well as his lower extremity pain.  He has never smoked.  His wife reports that he has been sleeping more than usual and frequently falls asleep whenever he sits down.  She feels like he has been getting more short of breath with time.     Past Medical History:  Diagnosis Date   Arthritis      ra, sees dr Sedonia Small   Chronic kidney disease     Coronary artery disease      saw dr zachery in [place] 20 yrs ago, released from cardiology   Diabetes mellitus without complication ([place])      type 2   Dyspnea     GERD (gastroesophageal reflux disease)     Hypercholesteremia     Hypertension     Peripheral vascular disease ([place])     Urethral stricture             Past Surgical History:  Procedure Laterality Date   ABDOMINAL AORTOGRAM W/LOWER EXTREMITY N/A 04/20/2022    Procedure: ABDOMINAL AORTOGRAM W/LOWER EXTREMITY;  Surgeon:  Wellington Hampshire, MD;  Location: [facility];  Service: Cardiovascular;  Laterality: N/A;   BACK SURGERY        x 3 lower back   CARDIAC CATHETERIZATION   2000    small amt plaque relased from cardiology 20 yrs ago   CHOLECYSTECTOMY       COLONOSCOPY WITH PROPOFOL N/A 10/26/2021    Procedure: COLONOSCOPY WITH PROPOFOL;  Surgeon: Eloise Harman, DO;  [facility];  Service: Endoscopy;  Laterality: N/A;  To arrive at [place] [facility] N/A 04/17/2019    Procedure: CYSTOSCOPY WITH URETHRAL DILATATION, CYSTOGRAM;  Surgeon: Ceasar Mons, MD;  Location: [facility];  Service: Urology;  Laterality: N/A;   EYE SURGERY Bilateral      cataracts   IR  RADIOLOGIST EVAL & MGMT   07/12/2021   PERIPHERAL VASCULAR BALLOON ANGIOPLASTY   04/20/2022    Procedure: PERIPHERAL VASCULAR BALLOON ANGIOPLASTY;  Surgeon: Wellington Hampshire, MD;  Location: [facility];  Service: Cardiovascular;;  aborted; unable to cross   POLYPECTOMY   10/26/2021    Procedure: POLYPECTOMY INTESTINAL;  Surgeon: Eloise Harman, DO;  [facility];  Service: Endoscopy;;   RIGHT/LEFT HEART CATH AND CORONARY ANGIOGRAPHY Bilateral 06/24/2022    Procedure: RIGHT/LEFT HEART CATH AND CORONARY ANGIOGRAPHY;  Surgeon: Wellington Hampshire, MD;  Location: [facility];  Service: Cardiovascular;  Laterality: Bilateral;   urethral stricture surgery   15 yrs ago           Family History  Problem Relation Age of Onset   Heart Problems Father        Social History Social History        Tobacco Use   Smoking status: Never   Smokeless tobacco: Never  Vaping Use   Vaping Use: Never used  Substance Use Topics   Alcohol use: No   Drug use: No            Current Outpatient Medications  Medication Sig Dispense Refill   albuterol (VENTOLIN HFA) 108 (90 Base) MCG/ACT inhaler Inhale 1-2 puffs into the lungs every 6 (six) hours as needed for wheezing or shortness of breath.       aspirin EC 81 MG tablet Take 81 mg by mouth daily. Swallow whole.       cholecalciferol (VITAMIN D3) 25 MCG (1000 UNIT) tablet Take 1,000 Units by mouth daily.       cyanocobalamin (VITAMIN B12) 1000 MCG tablet Take 1,000 mcg by mouth daily.       finasteride (PROSCAR) 5 MG tablet TAKE 1 TABLET BY MOUTH DAILY 123XX123 tablet 2   folic acid (FOLVITE) 1 MG tablet Take 1 mg by mouth daily.       gabapentin (NEURONTIN) 300 MG capsule Take 1 capsule (300 mg total) by mouth at bedtime. 30 capsule 5   glipiZIDE (GLUCOTROL) 5 MG tablet TAKE 1 TABLET BY MOUTH TWICE DAILY BEFORE A MEAL 30 tablet 0   hydrochlorothiazide (HYDRODIURIL) 25 MG tablet Take 1 tablet (25 mg total) by mouth daily. 90 tablet 0    linaclotide (LINZESS) 145 MCG CAPS capsule Take 145 mcg by mouth daily as needed.       losartan (COZAAR) 100 MG tablet Take 1 tablet (100 mg total) by mouth daily. 90 tablet 0   Menthol, Topical Analgesic, (BLUE-EMU MAXIMUM STRENGTH EX) Apply 1 Application topically daily as needed (pain).  methotrexate (RHEUMATREX) 2.5 MG tablet Take 12.5 mg by mouth every Sunday. Caution:Chemotherapy. Protect from light. Take 5 tablets PO once weekly.       metoprolol tartrate (LOPRESSOR) 50 MG tablet TAKE 1 TABLET BY MOUTH TWICE  DAILY 200 tablet 2   omeprazole (PRILOSEC) 20 MG capsule Take 1 capsule (20 mg total) by mouth daily. 30 capsule 5   OVER THE COUNTER MEDICATION Take 2 tablets by mouth daily. Omega XL (not Fish oil)       predniSONE (DELTASONE) 1 MG tablet Take 1 mg by mouth daily with breakfast.       rosuvastatin (CRESTOR) 10 MG tablet TAKE 1 TABLET BY MOUTH DAILY 90 tablet 3   silodosin (RAPAFLO) 8 MG CAPS capsule Take 1 capsule (8 mg total) by mouth in the morning and at bedtime. 200 capsule 2   traMADol (ULTRAM) 50 MG tablet Take by mouth 2 (two) times daily as needed.       triamcinolone cream (KENALOG) 0.1 % Apply 1 Application topically daily as needed (itching). 80 g 1   vitamin E 180 MG (400 UNITS) capsule Take 400 Units by mouth daily.       hydrocortisone (ANUSOL-HC) 2.5 % rectal cream Place 1 Application rectally 2 (two) times daily. For 10 days. May repeat cycle if needed. (Patient not taking: Reported on 06/29/2022) 30 g 1   methylPREDNISolone (MEDROL DOSEPAK) 4 MG TBPK tablet Take as the package instructed (Patient not taking: Reported on 06/29/2022) 1 each 0    No current facility-administered medications for this visit.          Allergies  Allergen Reactions   Ciprofloxacin Itching   Mosquito (Diagnostic) Itching   Oxycodone-Acetaminophen Rash      Review of Systems  Constitutional:  Positive for activity change and fatigue.  HENT: Negative.    Eyes: Negative.    Respiratory:  Positive for shortness of breath.   Cardiovascular:  Positive for chest pain and leg swelling.  Gastrointestinal: Negative.   Endocrine: Negative.   Genitourinary: Negative.   Musculoskeletal:  Positive for arthralgias.  Skin: Negative.   Allergic/Immunologic: Negative.   Neurological:  Negative for dizziness and syncope.  Hematological: Negative.   Psychiatric/Behavioral: Negative.        BP 136/80   Pulse 80   Resp 20   Ht '5\' 9"'$  (1.753 m)   Wt 220 lb (99.8 kg)   BMI 32.49 kg/m  Physical Exam Constitutional:      Appearance: Normal appearance. He is obese.  HENT:     Head: Normocephalic and atraumatic.  Eyes:     Extraocular Movements: Extraocular movements intact.     Conjunctiva/sclera: Conjunctivae normal.     Pupils: Pupils are equal, round, and reactive to light.  Neck:     Vascular: No carotid bruit.  Cardiovascular:     Rate and Rhythm: Normal rate and regular rhythm.     Heart sounds: Normal heart sounds. No murmur heard. Pulmonary:     Effort: Pulmonary effort is normal.     Breath sounds: Normal breath sounds.  Abdominal:     General: Bowel sounds are normal. There is no distension.     Tenderness: There is no abdominal tenderness.  Musculoskeletal:        General: Swelling present.  Skin:    General: Skin is warm and dry.  Neurological:     General: No focal deficit present.     Mental Status: He is alert and oriented to person,  place, and time.  Psychiatric:        Mood and Affect: Mood normal.        Behavior: Behavior normal.          Diagnostic Tests:   ECHOCARDIOGRAM REPORT       Patient Name:   Jonathan [name] Date of Exam: 06/10/2022  Medical Rec #:  [id number]        Height:       69.0 in  Accession #:    [id number]       Weight:       223.0 lb  Date of Birth:  12-May-1945         BSA:          2.164 m  Patient Age:    57 years         BP:           152/79 mmHg  Patient Gender: M                HR:           64 bpm.   Exam Location:  [facility]   Procedure: 2D Echo, Cardiac Doppler and Color Doppler   Indications:    R06.00 Dyspnea    History:        Patient has no prior history of Echocardiogram  examinations.                 PAD, Signs/Symptoms:Fatigue; Risk Factors:Hypertension,  Diabetes                 and Dyslipidemia. Lower extremity edema. Chronic kidney  disease.                 Obesity.    Sonographer:    Diamond Nickel RCS  Referring Phys: [place]     1. Left ventricular ejection fraction, by estimation, is 50 to 55%. The  left ventricle has low normal function. The left ventricle has no regional  wall motion abnormalities. There is mild left ventricular hypertrophy of  the basal-septal segment. Left  ventricular diastolic parameters are consistent with Grade I diastolic  dysfunction (impaired relaxation).   2. Right ventricular systolic function is normal. The right ventricular  size is normal.   3. The mitral valve is normal in structure. Mild mitral valve  regurgitation. No evidence of mitral stenosis.   4. The aortic valve is tricuspid. Aortic valve regurgitation is not  visualized. Aortic valve sclerosis is present, with no evidence of aortic  valve stenosis.   5. The inferior vena cava is normal in size with greater than 50%  respiratory variability, suggesting right atrial pressure of 3 mmHg.   Comparison(s): No prior Echocardiogram.   FINDINGS   Left Ventricle: Left ventricular ejection fraction, by estimation, is 50  to 55%. The left ventricle has low normal function. The left ventricle has  no regional wall motion abnormalities. The left ventricular internal  cavity size was normal in size.  There is mild left ventricular hypertrophy of the basal-septal segment.  Left ventricular diastolic parameters are consistent with Grade I  diastolic dysfunction (impaired relaxation).   Right Ventricle: The right ventricular size is normal.  Right ventricular  systolic function is normal.   Left Atrium: Left atrial size was normal in size.   Right Atrium: Right atrial size was normal in size.   Pericardium: There is no evidence of pericardial effusion.   Mitral Valve: The  mitral valve is normal in structure. Mild mitral valve  regurgitation. No evidence of mitral valve stenosis.   Tricuspid Valve: The tricuspid valve is normal in structure. Tricuspid  valve regurgitation is trivial. No evidence of tricuspid stenosis.   Aortic Valve: The aortic valve is tricuspid. Aortic valve regurgitation is  not visualized. Aortic valve sclerosis is present, with no evidence of  aortic valve stenosis.   Pulmonic Valve: The pulmonic valve was normal in structure. Pulmonic valve  regurgitation is trivial. No evidence of pulmonic stenosis.   Aorta: The aortic root is normal in size and structure.   Venous: The inferior vena cava is normal in size with greater than 50%  respiratory variability, suggesting right atrial pressure of 3 mmHg.   IAS/Shunts: No atrial level shunt detected by color flow Doppler.     LEFT VENTRICLE  PLAX 2D  LVIDd:         3.60 cm   Diastology  LVIDs:         2.15 cm   LV e' medial:    5.00 cm/s  LV PW:         1.00 cm   LV E/e' medial:  11.1  LV IVS:        1.00 cm   LV e' lateral:   8.27 cm/s  LVOT diam:     2.10 cm   LV E/e' lateral: 6.7  LV SV:         62  LV SV Index:   29  LVOT Area:     3.46 cm     RIGHT VENTRICLE  RV Basal diam:  3.20 cm  RV S prime:     8.81 cm/s  TAPSE (M-mode): 1.8 cm  RVSP:           23.8 mmHg   LEFT ATRIUM             Index        RIGHT ATRIUM           Index  LA diam:        3.80 cm 1.76 cm/m   RA Pressure: 3.00 mmHg  LA Vol (A2C):   49.4 ml 22.83 ml/m  RA Area:     13.90 cm  LA Vol (A4C):   38.5 ml 17.79 ml/m  RA Volume:   31.70 ml  14.65 ml/m  LA Biplane Vol: 43.9 ml 20.29 ml/m   AORTIC VALVE  LVOT Vmax:   79.00 cm/s  LVOT Vmean:  46.800 cm/s  LVOT VTI:     0.179 m    AORTA  Ao Root diam: 3.30 cm  Ao Asc diam:  3.40 cm   MITRAL VALVE               TRICUSPID VALVE  MV Area (PHT): 2.97 cm    TR Peak grad:   20.8 mmHg  MV Decel Time: 255 msec    TR Vmax:        228.00 cm/s  MV E velocity: 55.50 cm/s  Estimated RAP:  3.00 mmHg  MV A velocity: 73.90 cm/s  RVSP:           23.8 mmHg  MV E/A ratio:  0.75                             SHUNTS  Systemic VTI:  0.18 m                             Systemic Diam: 2.10 cm   Kirk Ruths MD  Electronically signed by Kirk Ruths MD  Signature Date/Time: 06/10/2022/9:33:40 AM        Final          Physicians   Panel Physicians Referring Physician Case Authorizing Physician  Wellington Hampshire, MD (Primary)        Procedures   RIGHT/LEFT HEART CATH AND CORONARY ANGIOGRAPHY    Conclusion       Ost RCA to Prox RCA lesion is 30% stenosed.   RPDA lesion is 70% stenosed.   3rd RPL lesion is 100% stenosed.   Ost LAD lesion is 90% stenosed.   Ost Cx to Prox Cx lesion is 80% stenosed.   Mid Cx to Dist Cx lesion is 50% stenosed.   Mid LAD-2 lesion is 80% stenosed.   2nd Diag lesion is 85% stenosed.   Mid LAD-1 lesion is 50% stenosed.   1st Diag lesion is 80% stenosed.   Mid LM to Dist LM lesion is 20% stenosed.   1.  Severe diffuse three-vessel coronary artery disease with involvement of ostial LAD and ostial left circumflex. 2.  Left ventricular angiography was not performed due to chronic kidney disease.  EF was mildly reduced by echo. 3.  Right heart catheterization showed normal RA pressure, moderate pulmonary hypertension and normal cardiac output.   RA: 7 mmHg RV: 55/1 mmHg PA: 56/23 with a mean of 37 mmHg Pulmonary wedge pressure: 23 mmHg with prominent V wave suggestive of mitral regurgitation. Cardiac output is 7.63 with an index of 3.54.   Recommendations: Given three-vessel coronary artery disease, diabetic status and diffuse disease, recommend  evaluation for CABG.   Indications   Chronic systolic heart failure (HCC) [I50.22 (ICD-10-CM)]  Abnormal stress test [id number] (ICD-10-CM)]    Procedural Details   Technical Details Procedural Details: The right antecubital area was prepped in a sterile fashion.  Ultrasound was used to identify the right brachial vein.  Under direct ultrasound visualization, a 5 French slender sheath was placed.  Ultrasound guidance was also used for right radial artery access.  The right wrist was prepped, draped, and anesthetized with 1% lidocaine. Using the modified Seldinger technique, a 5/6 French sheath was introduced into the right radial artery. 2.5 mg of verapamil was administered through the sheath, weight-based unfractionated heparin was administered intravenously. Right heart catheterization was performed using a 5 French Swan-Ganz catheter. Cardiac output was calculated by the Fick method.  I initially used a Jacky catheter but the innominate artery was extremely tortuous and could not engage the coronary arteries.  Thus, I elected to exchange the sheath into an 85 cm radial destination sheath.  I then used standard Judkins catheters to engage the coronary arteries.  The Jacky catheter was used to cross the aortic valve and record LV pressure with pullback.  Left ventricular angiography was not performed due to chronic kidney disease.Catheter exchanges were performed over an exchange length guidewire. There were no immediate procedural complications. A TR band was used for radial hemostasis at the completion of the procedure.  The patient was transferred to the post catheterization recovery area for further monitoring.   Estimated blood loss <50 mL.   During this procedure medications were administered to achieve and maintain moderate conscious sedation while the patient's heart  rate, blood pressure, and oxygen saturation were continuously monitored and I was present face-to-face 100% of this time.     Medications (Filter: Administrations occurring from 1328 to 1456 on 06/24/22)  important  Continuous medications are totaled by the amount administered until 06/24/22 1456.    fentaNYL (SUBLIMAZE) injection (mcg)  Total dose: 25 mcg Date/Time Rate/Dose/Volume Action    06/24/22 1354 25 mcg Given    midazolam (VERSED) injection (mg)  Total dose: 1 mg Date/Time Rate/Dose/Volume Action    06/24/22 1355 1 mg Given    verapamil (ISOPTIN) injection (mg)  Total dose: 5 mg Date/Time Rate/Dose/Volume Action    06/24/22 1404 2.5 mg Given    1417 2.5 mg Given    Heparin (Porcine) in NaCl 2000-0.9 UNIT/L-% SOLN (mL)  Total volume: 1,000 mL Date/Time Rate/Dose/Volume Action    06/24/22 1404 1,000 mL Given    heparin sodium (porcine) injection (Units)  Total dose: 7,000 Units Date/Time Rate/Dose/Volume Action    06/24/22 1412 5,000 Units Given    1419 2,000 Units Given    iohexol (OMNIPAQUE) 300 MG/ML solution (mL)  Total volume: 33 mL Date/Time Rate/Dose/Volume Action    06/24/22 1451 33 mL Given      Sedation Time   Sedation Time Physician-1: 52 minutes 30 seconds Contrast   Medication Name Total Dose  iohexol (OMNIPAQUE) 300 MG/ML solution 33 mL    Radiation/Fluoro   Fluoro time: 10.5 (min) DAP: 31.7 (Gycm2) Cumulative Air Kerma: 99991111 (mGy) Complications   Complications documented before study signed (06/24/2022  [id number] PM)    No complications were associated with this study.  Documented by Arletha Grippe, RT - 06/24/2022  2:48 PM      Coronary Findings   Diagnostic Dominance: Right Left Main  Mid LM to Dist LM lesion is 20% stenosed.    Left Anterior Descending  Ost LAD lesion is 90% stenosed.  Mid LAD-1 lesion is 50% stenosed.  Mid LAD-2 lesion is 80% stenosed.    First Diagonal Branch  Vessel is small in size.  1st Diag lesion is 80% stenosed.    Second Diagonal Branch  2nd Diag lesion is 85% stenosed.    Third Diagonal Branch  Vessel is small in size.     Left Circumflex  Ost Cx to Prox Cx lesion is 80% stenosed.  Mid Cx to Dist Cx lesion is 50% stenosed.    First Obtuse Marginal Branch  Vessel is moderate in size. Vessel is angiographically normal.    Second Obtuse Marginal Branch  Vessel is moderate in size. Vessel is angiographically normal.    Third Obtuse Marginal Branch  Vessel is small in size. Vessel is angiographically normal.    Right Coronary Artery  Ost RCA to Prox RCA lesion is 30% stenosed.    Right Posterior Descending Artery  RPDA lesion is 70% stenosed.    First Right Posterolateral Branch  Vessel is small in size.    Third Right Posterolateral Branch  Collaterals  3rd RPL filled by collaterals from Dist LAD.     3rd RPL lesion is 100% stenosed.    Intervention    No interventions have been documented.    Coronary Diagrams   Diagnostic Dominance: Right  Intervention     Impression:   This 77 year old gentleman has severe multivessel coronary disease with low normal left ventricular ejection fraction and worsening symptoms of exertional substernal chest discomfort, shortness of breath, and fatigue.  I agree that coronary artery bypass graft surgery is indicated for  relief of his symptoms and to prevent myocardial loss.  His operative risk is increased due to stage III chronic kidney disease with a creatinine of 1.63 and severe lower extremity vascular disease with rest pain in his left foot. I discussed the operative procedure with the patient and his wife including alternatives, benefits and risks; including but not limited to bleeding, blood transfusion, infection, stroke, myocardial infarction, graft failure, heart block requiring a permanent pacemaker, organ dysfunction, and death.  Jonathan [name] understands and agrees to proceed.     Plan:   Coronary bypass graft surgery.    Gaye Pollack, MD [facility] [phone]

## 2022-07-07 ENCOUNTER — Other Ambulatory Visit: Payer: Self-pay

## 2022-07-07 ENCOUNTER — Inpatient Hospital Stay (HOSPITAL_COMMUNITY)
Admission: RE | Admit: 2022-07-07 | Discharge: 2022-07-12 | DRG: 236 | Disposition: A | Discharge status: Home / Self Care | Payer: 59 | Source: Ambulatory Visit | Attending: Surgery | Admitting: Surgery

## 2022-07-07 ENCOUNTER — Inpatient Hospital Stay (HOSPITAL_COMMUNITY)
Admission: RE | Disposition: A | Discharge status: Home / Self Care | Payer: Self-pay | Source: Ambulatory Visit | Attending: Surgery

## 2022-07-07 ENCOUNTER — Inpatient Hospital Stay (HOSPITAL_COMMUNITY): Payer: 59

## 2022-07-07 ENCOUNTER — Inpatient Hospital Stay (HOSPITAL_COMMUNITY): Payer: 59 | Admitting: Vascular Surgery

## 2022-07-07 ENCOUNTER — Inpatient Hospital Stay (HOSPITAL_COMMUNITY): Payer: 59 | Admitting: Anesthesiology

## 2022-07-07 ENCOUNTER — Encounter (HOSPITAL_COMMUNITY): Payer: Self-pay | Admitting: Surgery

## 2022-07-07 DIAGNOSIS — I130 Hypertensive heart and chronic kidney disease with heart failure and stage 1 through stage 4 chronic kidney disease, or unspecified chronic kidney disease: Secondary | ICD-10-CM | POA: Diagnosis present

## 2022-07-07 DIAGNOSIS — E1122 Type 2 diabetes mellitus with diabetic chronic kidney disease: Secondary | ICD-10-CM | POA: Diagnosis present

## 2022-07-07 DIAGNOSIS — Z1152 Encounter for screening for COVID-19: Secondary | ICD-10-CM

## 2022-07-07 DIAGNOSIS — K219 Gastro-esophageal reflux disease without esophagitis: Secondary | ICD-10-CM | POA: Diagnosis present

## 2022-07-07 DIAGNOSIS — Z7984 Long term (current) use of oral hypoglycemic drugs: Secondary | ICD-10-CM

## 2022-07-07 DIAGNOSIS — I25119 Atherosclerotic heart disease of native coronary artery with unspecified angina pectoris: Secondary | ICD-10-CM | POA: Diagnosis present

## 2022-07-07 DIAGNOSIS — E7800 Pure hypercholesterolemia, unspecified: Secondary | ICD-10-CM | POA: Diagnosis present

## 2022-07-07 DIAGNOSIS — Z7982 Long term (current) use of aspirin: Secondary | ICD-10-CM

## 2022-07-07 DIAGNOSIS — I120 Hypertensive chronic kidney disease with stage 5 chronic kidney disease or end stage renal disease: Secondary | ICD-10-CM | POA: Diagnosis not present

## 2022-07-07 DIAGNOSIS — E871 Hypo-osmolality and hyponatremia: Secondary | ICD-10-CM | POA: Diagnosis not present

## 2022-07-07 DIAGNOSIS — E1151 Type 2 diabetes mellitus with diabetic peripheral angiopathy without gangrene: Secondary | ICD-10-CM | POA: Diagnosis present

## 2022-07-07 DIAGNOSIS — I5022 Chronic systolic (congestive) heart failure: Secondary | ICD-10-CM | POA: Diagnosis present

## 2022-07-07 DIAGNOSIS — Z885 Allergy status to narcotic agent status: Secondary | ICD-10-CM

## 2022-07-07 DIAGNOSIS — Z79899 Other long term (current) drug therapy: Secondary | ICD-10-CM

## 2022-07-07 DIAGNOSIS — E669 Obesity, unspecified: Secondary | ICD-10-CM | POA: Diagnosis present

## 2022-07-07 DIAGNOSIS — D62 Acute posthemorrhagic anemia: Secondary | ICD-10-CM | POA: Diagnosis not present

## 2022-07-07 DIAGNOSIS — N1832 Chronic kidney disease, stage 3b: Secondary | ICD-10-CM | POA: Diagnosis present

## 2022-07-07 DIAGNOSIS — M069 Rheumatoid arthritis, unspecified: Secondary | ICD-10-CM | POA: Diagnosis present

## 2022-07-07 DIAGNOSIS — Z79631 Long term (current) use of antimetabolite agent: Secondary | ICD-10-CM | POA: Diagnosis not present

## 2022-07-07 DIAGNOSIS — E11649 Type 2 diabetes mellitus with hypoglycemia without coma: Secondary | ICD-10-CM | POA: Diagnosis not present

## 2022-07-07 DIAGNOSIS — N189 Chronic kidney disease, unspecified: Secondary | ICD-10-CM

## 2022-07-07 DIAGNOSIS — I2510 Atherosclerotic heart disease of native coronary artery without angina pectoris: Secondary | ICD-10-CM

## 2022-07-07 DIAGNOSIS — I272 Pulmonary hypertension, unspecified: Secondary | ICD-10-CM | POA: Diagnosis present

## 2022-07-07 DIAGNOSIS — Z8249 Family history of ischemic heart disease and other diseases of the circulatory system: Secondary | ICD-10-CM | POA: Diagnosis not present

## 2022-07-07 DIAGNOSIS — Z881 Allergy status to other antibiotic agents status: Secondary | ICD-10-CM | POA: Diagnosis not present

## 2022-07-07 DIAGNOSIS — Z6832 Body mass index (BMI) 32.0-32.9, adult: Secondary | ICD-10-CM

## 2022-07-07 DIAGNOSIS — Z7952 Long term (current) use of systemic steroids: Secondary | ICD-10-CM | POA: Diagnosis not present

## 2022-07-07 DIAGNOSIS — J9811 Atelectasis: Secondary | ICD-10-CM | POA: Diagnosis not present

## 2022-07-07 DIAGNOSIS — N400 Enlarged prostate without lower urinary tract symptoms: Secondary | ICD-10-CM | POA: Diagnosis present

## 2022-07-07 DIAGNOSIS — D696 Thrombocytopenia, unspecified: Secondary | ICD-10-CM | POA: Diagnosis not present

## 2022-07-07 DIAGNOSIS — Z951 Presence of aortocoronary bypass graft: Principal | ICD-10-CM

## 2022-07-07 DIAGNOSIS — I459 Conduction disorder, unspecified: Secondary | ICD-10-CM | POA: Diagnosis present

## 2022-07-07 HISTORY — PX: TEE WITHOUT CARDIOVERSION: SHX5443

## 2022-07-07 HISTORY — PX: CORONARY ARTERY BYPASS GRAFT: SHX141

## 2022-07-07 LAB — POCT I-STAT 7, (LYTES, BLD GAS, ICA,H+H)
Acid-base deficit: 1 mmol/L (ref 0.0–2.0)
Acid-base deficit: 3 mmol/L — ABNORMAL HIGH (ref 0.0–2.0)
Acid-base deficit: 4 mmol/L — ABNORMAL HIGH (ref 0.0–2.0)
Acid-base deficit: 5 mmol/L — ABNORMAL HIGH (ref 0.0–2.0)
Bicarbonate: 24 mmol/L (ref 20.0–28.0)
Bicarbonate: 20.9 mmol/L (ref 20.0–28.0)
Bicarbonate: 20.9 mmol/L (ref 20.0–28.0)
Bicarbonate: 19.2 mmol/L — ABNORMAL LOW (ref 20.0–28.0)
Calcium, Ion: 1.19 mmol/L (ref 1.15–1.40)
Calcium, Ion: 1.03 mmol/L — ABNORMAL LOW (ref 1.15–1.40)
Calcium, Ion: 1.12 mmol/L — ABNORMAL LOW (ref 1.15–1.40)
Calcium, Ion: 1.12 mmol/L — ABNORMAL LOW (ref 1.15–1.40)
HCT: 38 % — ABNORMAL LOW (ref 39.0–52.0)
HCT: 26 % — ABNORMAL LOW (ref 39.0–52.0)
HCT: 29 % — ABNORMAL LOW (ref 39.0–52.0)
HCT: 32 % — ABNORMAL LOW (ref 39.0–52.0)
Hemoglobin: 12.9 g/dL — ABNORMAL LOW (ref 13.0–17.0)
Hemoglobin: 8.8 g/dL — ABNORMAL LOW (ref 13.0–17.0)
Hemoglobin: 9.9 g/dL — ABNORMAL LOW (ref 13.0–17.0)
Hemoglobin: 10.9 g/dL — ABNORMAL LOW (ref 13.0–17.0)
O2 Saturation: 100 %
O2 Saturation: 100 %
O2 Saturation: 100 %
O2 Saturation: 99 %
Patient temperature: 36.1
Potassium: 4.2 mmol/L (ref 3.5–5.1)
Potassium: 4.5 mmol/L (ref 3.5–5.1)
Potassium: 4.5 mmol/L (ref 3.5–5.1)
Potassium: 3.6 mmol/L (ref 3.5–5.1)
Sodium: 135 mmol/L (ref 135–145)
Sodium: 137 mmol/L (ref 135–145)
Sodium: 138 mmol/L (ref 135–145)
Sodium: 143 mmol/L (ref 135–145)
TCO2: 25 mmol/L (ref 22–32)
TCO2: 22 mmol/L (ref 22–32)
TCO2: 22 mmol/L (ref 22–32)
TCO2: 20 mmol/L — ABNORMAL LOW (ref 22–32)
pCO2 arterial: 39 mmHg (ref 32–48)
pCO2 arterial: 33.1 mmHg (ref 32–48)
pCO2 arterial: 36.7 mmHg (ref 32–48)
pCO2 arterial: 32.6 mmHg (ref 32–48)
pH, Arterial: 7.397 (ref 7.35–7.45)
pH, Arterial: 7.409 (ref 7.35–7.45)
pH, Arterial: 7.363 (ref 7.35–7.45)
pH, Arterial: 7.375 (ref 7.35–7.45)
pO2, Arterial: 324 mmHg — ABNORMAL HIGH (ref 83–108)
pO2, Arterial: 446 mmHg — ABNORMAL HIGH (ref 83–108)
pO2, Arterial: 367 mmHg — ABNORMAL HIGH (ref 83–108)
pO2, Arterial: 139 mmHg — ABNORMAL HIGH (ref 83–108)

## 2022-07-07 LAB — BASIC METABOLIC PANEL
Anion gap: 10 (ref 5–15)
BUN: 21 mg/dL (ref 8–23)
CO2: 20 mmol/L — ABNORMAL LOW (ref 22–32)
Calcium: 7.9 mg/dL — ABNORMAL LOW (ref 8.9–10.3)
Chloride: 106 mmol/L (ref 98–111)
Creatinine, Ser: 1.38 mg/dL — ABNORMAL HIGH (ref 0.61–1.24)
GFR, Estimated: 54 mL/min — ABNORMAL LOW (ref >60)
Glucose, Bld: 103 mg/dL — ABNORMAL HIGH (ref 70–99)
Potassium: 3.9 mmol/L (ref 3.5–5.1)
Sodium: 136 mmol/L (ref 135–145)

## 2022-07-07 LAB — GLUCOSE, CAPILLARY
Glucose-Capillary: 85 mg/dL (ref 70–99)
Glucose-Capillary: 124 mg/dL — ABNORMAL HIGH (ref 70–99)
Glucose-Capillary: 116 mg/dL — ABNORMAL HIGH (ref 70–99)
Glucose-Capillary: 99 mg/dL (ref 70–99)
Glucose-Capillary: 125 mg/dL — ABNORMAL HIGH (ref 70–99)
Glucose-Capillary: 116 mg/dL — ABNORMAL HIGH (ref 70–99)
Glucose-Capillary: 115 mg/dL — ABNORMAL HIGH (ref 70–99)
Glucose-Capillary: 97 mg/dL (ref 70–99)
Glucose-Capillary: 152 mg/dL — ABNORMAL HIGH (ref 70–99)
Glucose-Capillary: 158 mg/dL — ABNORMAL HIGH (ref 70–99)

## 2022-07-07 LAB — CBC
HCT: 30.6 % — ABNORMAL LOW (ref 39.0–52.0)
HCT: 32 % — ABNORMAL LOW (ref 39.0–52.0)
Hemoglobin: 10.4 g/dL — ABNORMAL LOW (ref 13.0–17.0)
Hemoglobin: 10.6 g/dL — ABNORMAL LOW (ref 13.0–17.0)
MCH: 30.7 pg (ref 26.0–34.0)
MCH: 29.9 pg (ref 26.0–34.0)
MCHC: 34 g/dL (ref 30.0–36.0)
MCHC: 33.1 g/dL (ref 30.0–36.0)
MCV: 90.3 fL (ref 80.0–100.0)
MCV: 90.4 fL (ref 80.0–100.0)
Platelets: 137 10*3/uL — ABNORMAL LOW (ref 150–400)
Platelets: 137 10*3/uL — ABNORMAL LOW (ref 150–400)
RBC: 3.39 MIL/uL — ABNORMAL LOW (ref 4.22–5.81)
RBC: 3.54 MIL/uL — ABNORMAL LOW (ref 4.22–5.81)
RDW: 15.4 % (ref 11.5–15.5)
RDW: 15.3 % (ref 11.5–15.5)
WBC: 11.9 10*3/uL — ABNORMAL HIGH (ref 4.0–10.5)
WBC: 8.6 10*3/uL (ref 4.0–10.5)
nRBC: 0 % (ref 0.0–0.2)
nRBC: 0 % (ref 0.0–0.2)

## 2022-07-07 LAB — POCT I-STAT, CHEM 8
BUN: 37 mg/dL — ABNORMAL HIGH (ref 8–23)
BUN: 26 mg/dL — ABNORMAL HIGH (ref 8–23)
BUN: 23 mg/dL (ref 8–23)
BUN: 24 mg/dL — ABNORMAL HIGH (ref 8–23)
BUN: 23 mg/dL (ref 8–23)
Calcium, Ion: 1.19 mmol/L (ref 1.15–1.40)
Calcium, Ion: 1.18 mmol/L (ref 1.15–1.40)
Calcium, Ion: 1.04 mmol/L — ABNORMAL LOW (ref 1.15–1.40)
Calcium, Ion: 1.1 mmol/L — ABNORMAL LOW (ref 1.15–1.40)
Calcium, Ion: 1.11 mmol/L — ABNORMAL LOW (ref 1.15–1.40)
Chloride: 103 mmol/L (ref 98–111)
Chloride: 104 mmol/L (ref 98–111)
Chloride: 104 mmol/L (ref 98–111)
Chloride: 104 mmol/L (ref 98–111)
Chloride: 105 mmol/L (ref 98–111)
Creatinine, Ser: 1.6 mg/dL — ABNORMAL HIGH (ref 0.61–1.24)
Creatinine, Ser: 1.5 mg/dL — ABNORMAL HIGH (ref 0.61–1.24)
Creatinine, Ser: 1.4 mg/dL — ABNORMAL HIGH (ref 0.61–1.24)
Creatinine, Ser: 1.3 mg/dL — ABNORMAL HIGH (ref 0.61–1.24)
Creatinine, Ser: 1.3 mg/dL — ABNORMAL HIGH (ref 0.61–1.24)
Glucose, Bld: 96 mg/dL (ref 70–99)
Glucose, Bld: 102 mg/dL — ABNORMAL HIGH (ref 70–99)
Glucose, Bld: 161 mg/dL — ABNORMAL HIGH (ref 70–99)
Glucose, Bld: 190 mg/dL — ABNORMAL HIGH (ref 70–99)
Glucose, Bld: 194 mg/dL — ABNORMAL HIGH (ref 70–99)
HCT: 42 % (ref 39.0–52.0)
HCT: 35 % — ABNORMAL LOW (ref 39.0–52.0)
HCT: 28 % — ABNORMAL LOW (ref 39.0–52.0)
HCT: 29 % — ABNORMAL LOW (ref 39.0–52.0)
HCT: 30 % — ABNORMAL LOW (ref 39.0–52.0)
Hemoglobin: 14.3 g/dL (ref 13.0–17.0)
Hemoglobin: 11.9 g/dL — ABNORMAL LOW (ref 13.0–17.0)
Hemoglobin: 9.5 g/dL — ABNORMAL LOW (ref 13.0–17.0)
Hemoglobin: 9.9 g/dL — ABNORMAL LOW (ref 13.0–17.0)
Hemoglobin: 10.2 g/dL — ABNORMAL LOW (ref 13.0–17.0)
Potassium: 4.2 mmol/L (ref 3.5–5.1)
Potassium: 3.9 mmol/L (ref 3.5–5.1)
Potassium: 4.6 mmol/L (ref 3.5–5.1)
Potassium: 5.4 mmol/L — ABNORMAL HIGH (ref 3.5–5.1)
Potassium: 4.5 mmol/L (ref 3.5–5.1)
Sodium: 137 mmol/L (ref 135–145)
Sodium: 139 mmol/L (ref 135–145)
Sodium: 138 mmol/L (ref 135–145)
Sodium: 138 mmol/L (ref 135–145)
Sodium: 138 mmol/L (ref 135–145)
TCO2: 26 mmol/L (ref 22–32)
TCO2: 24 mmol/L (ref 22–32)
TCO2: 26 mmol/L (ref 22–32)
TCO2: 25 mmol/L (ref 22–32)
TCO2: 24 mmol/L (ref 22–32)

## 2022-07-07 LAB — POCT I-STAT EG7
Acid-base deficit: 5 mmol/L — ABNORMAL HIGH (ref 0.0–2.0)
Bicarbonate: 20 mmol/L (ref 20.0–28.0)
Calcium, Ion: 1 mmol/L — ABNORMAL LOW (ref 1.15–1.40)
HCT: 28 % — ABNORMAL LOW (ref 39.0–52.0)
Hemoglobin: 9.5 g/dL — ABNORMAL LOW (ref 13.0–17.0)
O2 Saturation: 87 %
Potassium: 4.5 mmol/L (ref 3.5–5.1)
Sodium: 139 mmol/L (ref 135–145)
TCO2: 21 mmol/L — ABNORMAL LOW (ref 22–32)
pCO2, Ven: 34.2 mmHg — ABNORMAL LOW (ref 44–60)
pH, Ven: 7.375 (ref 7.25–7.43)
pO2, Ven: 54 mmHg — ABNORMAL HIGH (ref 32–45)

## 2022-07-07 LAB — HEMOGLOBIN AND HEMATOCRIT, BLOOD
HCT: 26.4 % — ABNORMAL LOW (ref 39.0–52.0)
Hemoglobin: 8.9 g/dL — ABNORMAL LOW (ref 13.0–17.0)

## 2022-07-07 LAB — PROTIME-INR
INR: 1.4 — ABNORMAL HIGH (ref 0.8–1.2)
Prothrombin Time: 16.8 seconds — ABNORMAL HIGH (ref 11.4–15.2)

## 2022-07-07 LAB — ECHO INTRAOPERATIVE TEE
Height: 69 in
Weight: 3536 oz

## 2022-07-07 LAB — ABO/RH: ABO/RH(D): B POS

## 2022-07-07 LAB — PLATELET COUNT: Platelets: 113 10*3/uL — ABNORMAL LOW (ref 150–400)

## 2022-07-07 LAB — APTT: aPTT: 35 seconds (ref 24–36)

## 2022-07-07 LAB — PREPARE RBC (CROSSMATCH)

## 2022-07-07 LAB — MAGNESIUM: Magnesium: 3.3 mg/dL — ABNORMAL HIGH (ref 1.7–2.4)

## 2022-07-07 SURGERY — CORONARY ARTERY BYPASS GRAFTING (CABG)
Anesthesia: General | Site: Chest

## 2022-07-07 MED ORDER — FENTANYL CITRATE (PF) 250 MCG/5ML IJ SOLN
INTRAMUSCULAR | Status: AC
  Filled 2022-07-07: qty 5

## 2022-07-07 MED ORDER — BISACODYL 5 MG PO TBEC
10.0000 mg | DELAYED_RELEASE_TABLET | Freq: Every day | ORAL | Status: DC
  Administered 2022-07-08 – 2022-07-09 (×2): 10 mg via ORAL
  Filled 2022-07-07 (×4): qty 2

## 2022-07-07 MED ORDER — DEXTROSE 50 % IV SOLN: 0.0000 mL | INTRAVENOUS | Status: DC | PRN

## 2022-07-07 MED ORDER — 0.9 % SODIUM CHLORIDE (POUR BTL) OPTIME
TOPICAL | Status: DC | PRN
  Administered 2022-07-07: 5000 mL

## 2022-07-07 MED ORDER — LACTATED RINGERS IV SOLN: INTRAVENOUS | Status: DC | PRN

## 2022-07-07 MED ORDER — DOPAMINE-DEXTROSE 3.2-5 MG/ML-% IV SOLN
INTRAVENOUS | Status: DC | PRN
  Administered 2022-07-07: 2 ug/kg/min via INTRAVENOUS

## 2022-07-07 MED ORDER — TAMSULOSIN HCL 0.4 MG PO CAPS
0.4000 mg | ORAL_CAPSULE | Freq: Every day | ORAL | Status: DC
  Administered 2022-07-08 – 2022-07-11 (×4): 0.4 mg via ORAL
  Filled 2022-07-07 (×4): qty 1

## 2022-07-07 MED ORDER — HYDROCORTISONE SOD SUC (PF) 100 MG IJ SOLR
100.0000 mg | Freq: Three times a day (TID) | INTRAMUSCULAR | Status: AC
  Administered 2022-07-07 – 2022-07-08 (×3): 100 mg via INTRAVENOUS
  Filled 2022-07-07 (×3): qty 2

## 2022-07-07 MED ORDER — ASPIRIN 325 MG PO TBEC
325.0000 mg | DELAYED_RELEASE_TABLET | Freq: Every day | ORAL | Status: DC
  Administered 2022-07-08 – 2022-07-12 (×5): 325 mg via ORAL
  Filled 2022-07-07 (×5): qty 1

## 2022-07-07 MED ORDER — SODIUM CHLORIDE 0.9 % IV SOLN: INTRAVENOUS | Status: DC | PRN

## 2022-07-07 MED ORDER — DOCUSATE SODIUM 100 MG PO CAPS
200.0000 mg | ORAL_CAPSULE | Freq: Every day | ORAL | Status: DC
  Administered 2022-07-08 – 2022-07-09 (×2): 200 mg via ORAL
  Filled 2022-07-07 (×4): qty 2

## 2022-07-07 MED ORDER — LACTATED RINGERS IV SOLN: INTRAVENOUS | Status: DC

## 2022-07-07 MED ORDER — MIDAZOLAM HCL (PF) 10 MG/2ML IJ SOLN
INTRAMUSCULAR | Status: AC
  Filled 2022-07-07: qty 2

## 2022-07-07 MED ORDER — SODIUM CHLORIDE 0.9% FLUSH: 3.0000 mL | INTRAVENOUS | Status: DC | PRN

## 2022-07-07 MED ORDER — ACETAMINOPHEN 160 MG/5ML PO SOLN: 1000.0000 mg | Freq: Four times a day (QID) | ORAL | Status: DC

## 2022-07-07 MED ORDER — HEPARIN SODIUM (PORCINE) 1000 UNIT/ML IJ SOLN
INTRAMUSCULAR | Status: DC | PRN
  Administered 2022-07-07: 35000 [IU] via INTRAVENOUS

## 2022-07-07 MED ORDER — ALBUMIN HUMAN 5 % IV SOLN: INTRAVENOUS | Status: DC | PRN

## 2022-07-07 MED ORDER — DEXMEDETOMIDINE HCL IN NACL 400 MCG/100ML IV SOLN
INTRAVENOUS | Status: AC
  Filled 2022-07-07: qty 100

## 2022-07-07 MED ORDER — PROPOFOL 10 MG/ML IV BOLUS
INTRAVENOUS | Status: AC
  Filled 2022-07-07: qty 20

## 2022-07-07 MED ORDER — DEXMEDETOMIDINE HCL IN NACL 400 MCG/100ML IV SOLN
0.0000 ug/kg/h | INTRAVENOUS | Status: DC
  Administered 2022-07-07: 0.7 ug/kg/h via INTRAVENOUS

## 2022-07-07 MED ORDER — SODIUM BICARBONATE 8.4 % IV SOLN
50.0000 meq | Freq: Once | INTRAVENOUS | Status: AC
  Administered 2022-07-07: 50 meq via INTRAVENOUS

## 2022-07-07 MED ORDER — MORPHINE SULFATE (PF) 2 MG/ML IV SOLN
1.0000 mg | INTRAVENOUS | Status: DC | PRN
  Administered 2022-07-08: 2 mg via INTRAVENOUS
  Filled 2022-07-07: qty 2

## 2022-07-07 MED ORDER — CHLORHEXIDINE GLUCONATE 4 % EX LIQD: 30.0000 mL | CUTANEOUS | Status: DC

## 2022-07-07 MED ORDER — SODIUM CHLORIDE 0.9 % IV SOLN: INTRAVENOUS | Status: DC

## 2022-07-07 MED ORDER — SODIUM CHLORIDE 0.9 % IV SOLN
250.0000 mL | INTRAVENOUS | Status: DC
  Administered 2022-07-08: 250 mL via INTRAVENOUS

## 2022-07-07 MED ORDER — MAGNESIUM SULFATE 4 GM/100ML IV SOLN
4.0000 g | Freq: Once | INTRAVENOUS | Status: AC
  Administered 2022-07-07: 4 g via INTRAVENOUS
  Filled 2022-07-07: qty 100

## 2022-07-07 MED ORDER — LACTATED RINGERS IV SOLN
500.0000 mL | Freq: Once | INTRAVENOUS | Status: AC | PRN
  Administered 2022-07-07: 500 mL via INTRAVENOUS

## 2022-07-07 MED ORDER — BISACODYL 10 MG RE SUPP: 10.0000 mg | Freq: Every day | RECTAL | Status: DC

## 2022-07-07 MED ORDER — ACETAMINOPHEN 160 MG/5ML PO SOLN: 650.0000 mg | Freq: Once | ORAL | Status: AC

## 2022-07-07 MED ORDER — METOPROLOL TARTRATE 12.5 MG HALF TABLET
12.5000 mg | ORAL_TABLET | Freq: Two times a day (BID) | ORAL | Status: DC
  Administered 2022-07-08: 12.5 mg via ORAL
  Filled 2022-07-07: qty 1

## 2022-07-07 MED ORDER — VANCOMYCIN HCL IN DEXTROSE 1-5 GM/200ML-% IV SOLN
1000.0000 mg | Freq: Once | INTRAVENOUS | Status: AC
  Administered 2022-07-07: 1000 mg via INTRAVENOUS
  Filled 2022-07-07: qty 200

## 2022-07-07 MED ORDER — ACETAMINOPHEN 650 MG RE SUPP
650.0000 mg | Freq: Once | RECTAL | Status: AC
  Administered 2022-07-07: 650 mg via RECTAL

## 2022-07-07 MED ORDER — ONDANSETRON HCL 4 MG/2ML IJ SOLN
4.0000 mg | Freq: Four times a day (QID) | INTRAMUSCULAR | Status: DC | PRN
  Administered 2022-07-08: 4 mg via INTRAVENOUS
  Filled 2022-07-07: qty 2

## 2022-07-07 MED ORDER — ACETAMINOPHEN 500 MG PO TABS
1000.0000 mg | ORAL_TABLET | Freq: Four times a day (QID) | ORAL | Status: DC
  Administered 2022-07-08 – 2022-07-12 (×16): 1000 mg via ORAL
  Filled 2022-07-07 (×17): qty 2

## 2022-07-07 MED ORDER — ALBUMIN HUMAN 5 % IV SOLN
250.0000 mL | INTRAVENOUS | Status: AC | PRN
  Administered 2022-07-07 (×4): 12.5 g via INTRAVENOUS
  Filled 2022-07-07 (×2): qty 250

## 2022-07-07 MED ORDER — MIDAZOLAM HCL 2 MG/2ML IJ SOLN
2.0000 mg | INTRAMUSCULAR | Status: DC | PRN
  Filled 2022-07-07: qty 2

## 2022-07-07 MED ORDER — FENTANYL CITRATE (PF) 250 MCG/5ML IJ SOLN
INTRAMUSCULAR | Status: DC | PRN
  Administered 2022-07-07: 100 ug via INTRAVENOUS
  Administered 2022-07-07 (×2): 50 ug via INTRAVENOUS
  Administered 2022-07-07: 100 ug via INTRAVENOUS
  Administered 2022-07-07: 50 ug via INTRAVENOUS
  Administered 2022-07-07 (×2): 100 ug via INTRAVENOUS
  Administered 2022-07-07: 150 ug via INTRAVENOUS
  Administered 2022-07-07: 50 ug via INTRAVENOUS
  Administered 2022-07-07: 100 ug via INTRAVENOUS
  Administered 2022-07-07: 50 ug via INTRAVENOUS

## 2022-07-07 MED ORDER — DOPAMINE-DEXTROSE 3.2-5 MG/ML-% IV SOLN: 2.0000 ug/kg/min | INTRAVENOUS | Status: DC

## 2022-07-07 MED ORDER — PLASMA-LYTE A IV SOLN: INTRAVENOUS | Status: DC | PRN

## 2022-07-07 MED ORDER — SODIUM BICARBONATE 4.2 % IV SOLN: 25.0000 meq | Freq: Once | INTRAVENOUS | Status: DC

## 2022-07-07 MED ORDER — INSULIN REGULAR(HUMAN) IN NACL 100-0.9 UT/100ML-% IV SOLN: INTRAVENOUS | Status: DC

## 2022-07-07 MED ORDER — ROSUVASTATIN CALCIUM 20 MG PO TABS
40.0000 mg | ORAL_TABLET | Freq: Every day | ORAL | Status: DC
  Administered 2022-07-08 – 2022-07-12 (×5): 40 mg via ORAL
  Filled 2022-07-07 (×5): qty 2

## 2022-07-07 MED ORDER — THROMBIN 20000 UNITS EX SOLR
OROMUCOSAL | Status: DC | PRN
  Administered 2022-07-07 (×3): 4 mL via TOPICAL

## 2022-07-07 MED ORDER — ROCURONIUM BROMIDE 10 MG/ML (PF) SYRINGE
PREFILLED_SYRINGE | INTRAVENOUS | Status: DC | PRN
  Administered 2022-07-07: 50 mg via INTRAVENOUS
  Administered 2022-07-07: 100 mg via INTRAVENOUS
  Administered 2022-07-07 (×3): 50 mg via INTRAVENOUS

## 2022-07-07 MED ORDER — PROTAMINE SULFATE 10 MG/ML IV SOLN
INTRAVENOUS | Status: DC | PRN
  Administered 2022-07-07: 350 mg via INTRAVENOUS

## 2022-07-07 MED ORDER — METOPROLOL TARTRATE 5 MG/5ML IV SOLN
2.5000 mg | INTRAVENOUS | Status: DC | PRN
  Administered 2022-07-08: 2.5 mg via INTRAVENOUS
  Administered 2022-07-08 (×2): 5 mg via INTRAVENOUS
  Filled 2022-07-07 (×3): qty 5

## 2022-07-07 MED ORDER — METOPROLOL TARTRATE 25 MG/10 ML ORAL SUSPENSION
12.5000 mg | Freq: Two times a day (BID) | ORAL | Status: DC
  Administered 2022-07-07: 12.5 mg
  Filled 2022-07-07: qty 10

## 2022-07-07 MED ORDER — FAMOTIDINE IN NACL 20-0.9 MG/50ML-% IV SOLN
20.0000 mg | Freq: Two times a day (BID) | INTRAVENOUS | Status: AC
  Administered 2022-07-07 (×2): 20 mg via INTRAVENOUS
  Filled 2022-07-07 (×2): qty 50

## 2022-07-07 MED ORDER — POTASSIUM CHLORIDE 10 MEQ/50ML IV SOLN
10.0000 meq | INTRAVENOUS | Status: AC
  Administered 2022-07-07 (×3): 10 meq via INTRAVENOUS

## 2022-07-07 MED ORDER — SODIUM CHLORIDE 0.9% FLUSH
3.0000 mL | Freq: Two times a day (BID) | INTRAVENOUS | Status: DC
  Administered 2022-07-08 – 2022-07-09 (×3): 3 mL via INTRAVENOUS

## 2022-07-07 MED ORDER — TRAMADOL HCL 50 MG PO TABS
50.0000 mg | ORAL_TABLET | ORAL | Status: DC | PRN
  Administered 2022-07-08: 100 mg via ORAL
  Administered 2022-07-08 – 2022-07-09 (×3): 50 mg via ORAL
  Administered 2022-07-09: 100 mg via ORAL
  Filled 2022-07-07: qty 2
  Filled 2022-07-07 (×4): qty 1
  Filled 2022-07-07 (×2): qty 2

## 2022-07-07 MED ORDER — CHLORHEXIDINE GLUCONATE 0.12 % MT SOLN
15.0000 mL | Freq: Once | OROMUCOSAL | Status: AC
  Administered 2022-07-07: 15 mL via OROMUCOSAL
  Filled 2022-07-07: qty 15

## 2022-07-07 MED ORDER — HEMOSTATIC AGENTS (NO CHARGE) OPTIME
TOPICAL | Status: DC | PRN
  Administered 2022-07-07: 1 via TOPICAL

## 2022-07-07 MED ORDER — SODIUM CHLORIDE 0.45 % IV SOLN: INTRAVENOUS | Status: DC | PRN

## 2022-07-07 MED ORDER — ~~LOC~~ CARDIAC SURGERY, PATIENT & FAMILY EDUCATION
Freq: Once | Status: DC
  Filled 2022-07-07: qty 1

## 2022-07-07 MED ORDER — METOPROLOL TARTRATE 12.5 MG HALF TABLET: 12.5000 mg | ORAL_TABLET | Freq: Once | ORAL | Status: DC

## 2022-07-07 MED ORDER — MIDAZOLAM HCL (PF) 5 MG/ML IJ SOLN
INTRAMUSCULAR | Status: DC | PRN
  Administered 2022-07-07: 2 mg via INTRAVENOUS
  Administered 2022-07-07: 3 mg via INTRAVENOUS

## 2022-07-07 MED ORDER — CEFAZOLIN SODIUM-DEXTROSE 2-4 GM/100ML-% IV SOLN
2.0000 g | Freq: Three times a day (TID) | INTRAVENOUS | Status: AC
  Administered 2022-07-07 – 2022-07-09 (×6): 2 g via INTRAVENOUS
  Filled 2022-07-07 (×6): qty 100

## 2022-07-07 MED ORDER — PHENYLEPHRINE HCL-NACL 20-0.9 MG/250ML-% IV SOLN: 0.0000 ug/min | INTRAVENOUS | Status: DC

## 2022-07-07 MED ORDER — CHLORHEXIDINE GLUCONATE CLOTH 2 % EX PADS
6.0000 | MEDICATED_PAD | Freq: Every day | CUTANEOUS | Status: DC
  Administered 2022-07-07 – 2022-07-12 (×7): 6 via TOPICAL

## 2022-07-07 MED ORDER — PHENYLEPHRINE 80 MCG/ML (10ML) SYRINGE FOR IV PUSH (FOR BLOOD PRESSURE SUPPORT)
PREFILLED_SYRINGE | INTRAVENOUS | Status: DC | PRN
  Administered 2022-07-07: 160 ug via INTRAVENOUS
  Administered 2022-07-07: 40 ug via INTRAVENOUS

## 2022-07-07 MED ORDER — ASPIRIN 81 MG PO CHEW: 324.0000 mg | CHEWABLE_TABLET | Freq: Every day | ORAL | Status: DC

## 2022-07-07 MED ORDER — PROPOFOL 10 MG/ML IV BOLUS
INTRAVENOUS | Status: DC | PRN
  Administered 2022-07-07: 100 mg via INTRAVENOUS

## 2022-07-07 MED ORDER — PANTOPRAZOLE SODIUM 40 MG PO TBEC
40.0000 mg | DELAYED_RELEASE_TABLET | Freq: Every day | ORAL | Status: DC
  Administered 2022-07-09 – 2022-07-12 (×4): 40 mg via ORAL
  Filled 2022-07-07 (×4): qty 1

## 2022-07-07 MED ORDER — THROMBIN (RECOMBINANT) 20000 UNITS EX SOLR
CUTANEOUS | Status: AC
  Filled 2022-07-07: qty 20000

## 2022-07-07 MED ORDER — CHLORHEXIDINE GLUCONATE 0.12 % MT SOLN
15.0000 mL | OROMUCOSAL | Status: AC
  Administered 2022-07-07: 15 mL via OROMUCOSAL
  Filled 2022-07-07: qty 15

## 2022-07-07 MED ORDER — NITROGLYCERIN IN D5W 200-5 MCG/ML-% IV SOLN
0.0000 ug/min | INTRAVENOUS | Status: DC
  Administered 2022-07-07: 5 ug/min via INTRAVENOUS

## 2022-07-07 SURGICAL SUPPLY — 107 items
ADH SKN CLS APL DERMABOND .7 (GAUZE/BANDAGES/DRESSINGS) ×2
BAG DECANTER FOR FLEXI CONT (MISCELLANEOUS) ×2 IMPLANT
BLADE STERNUM SYSTEM 6 (BLADE) ×2 IMPLANT
BLADE SURG 11 STRL SS (BLADE) IMPLANT
BNDG ELASTIC 4X5.8 VLCR STR LF (GAUZE/BANDAGES/DRESSINGS) ×2 IMPLANT
BNDG ELASTIC 6X5.8 VLCR STR LF (GAUZE/BANDAGES/DRESSINGS) ×2 IMPLANT
BNDG GAUZE DERMACEA FLUFF 4 (GAUZE/BANDAGES/DRESSINGS) ×2 IMPLANT
BNDG GZE DERMACEA 4 6PLY (GAUZE/BANDAGES/DRESSINGS) ×2
CANISTER SUCT 3000ML PPV (MISCELLANEOUS) ×2 IMPLANT
CANNULA ARTERIAL NVNT 3/8 22FR (MISCELLANEOUS) IMPLANT
CANNULA MC2 2 STG 36/46 NON-V (CANNULA) IMPLANT
CATH COUDE FOLEY 5CC 14FR (CATHETERS) IMPLANT
CATH ROBINSON RED A/P 18FR (CATHETERS) ×4 IMPLANT
CATH THORACIC 28FR (CATHETERS) ×2 IMPLANT
CATH THORACIC 36FR (CATHETERS) ×2 IMPLANT
CATH THORACIC 36FR RT ANG (CATHETERS) ×2 IMPLANT
CLIP TI MEDIUM 24 (CLIP) IMPLANT
CLIP TI WIDE RED SMALL 24 (CLIP) IMPLANT
CONN Y 3/8X3/8X3/8  BEN (MISCELLANEOUS) ×4
CONN Y 3/8X3/8X3/8 BEN (MISCELLANEOUS) IMPLANT
CONTAINER PROTECT SURGISLUSH (MISCELLANEOUS) ×4 IMPLANT
DERMABOND ADVANCED .7 DNX12 (GAUZE/BANDAGES/DRESSINGS) IMPLANT
DRAPE CARDIOVASCULAR INCISE (DRAPES) ×2
DRAPE SRG 135X102X78XABS (DRAPES) ×2 IMPLANT
DRAPE WARM FLUID 44X44 (DRAPES) ×2 IMPLANT
DRSG COVADERM 4X14 (GAUZE/BANDAGES/DRESSINGS) ×2 IMPLANT
ELECT BLADE 4.0 EZ CLEAN MEGAD (MISCELLANEOUS) ×2
ELECT CAUTERY BLADE 6.4 (BLADE) ×2 IMPLANT
ELECT REM PT RETURN 9FT ADLT (ELECTROSURGICAL) ×4
ELECTRODE BLDE 4.0 EZ CLN MEGD (MISCELLANEOUS) IMPLANT
ELECTRODE REM PT RTRN 9FT ADLT (ELECTROSURGICAL) ×4 IMPLANT
FELT TEFLON 1X6 (MISCELLANEOUS) ×4 IMPLANT
GAUZE 4X4 16PLY ~~LOC~~+RFID DBL (SPONGE) ×2 IMPLANT
GAUZE SPONGE 4X4 12PLY STRL (GAUZE/BANDAGES/DRESSINGS) ×4 IMPLANT
GLOVE BIO SURGEON STRL SZ 6 (GLOVE) IMPLANT
GLOVE BIO SURGEON STRL SZ 6.5 (GLOVE) IMPLANT
GLOVE BIO SURGEON STRL SZ7 (GLOVE) IMPLANT
GLOVE BIO SURGEON STRL SZ7.5 (GLOVE) IMPLANT
GLOVE BIOGEL PI IND STRL 6 (GLOVE) IMPLANT
GLOVE BIOGEL PI IND STRL 6.5 (GLOVE) IMPLANT
GLOVE BIOGEL PI IND STRL 7.0 (GLOVE) IMPLANT
GLOVE ORTHO TXT STRL SZ7.5 (GLOVE) IMPLANT
GLOVE SURG MICRO LTX SZ7 (GLOVE) ×4 IMPLANT
GOWN STRL REUS W/ TWL LRG LVL3 (GOWN DISPOSABLE) ×8 IMPLANT
GOWN STRL REUS W/ TWL XL LVL3 (GOWN DISPOSABLE) ×2 IMPLANT
GOWN STRL REUS W/TWL LRG LVL3 (GOWN DISPOSABLE) ×6
GOWN STRL REUS W/TWL XL LVL3 (GOWN DISPOSABLE) ×8
HEMOSTAT POWDER SURGIFOAM 1G (HEMOSTASIS) ×6 IMPLANT
HEMOSTAT SURGICEL 2X14 (HEMOSTASIS) ×2 IMPLANT
INSERT FOGARTY 61MM (MISCELLANEOUS) IMPLANT
INSERT FOGARTY XLG (MISCELLANEOUS) IMPLANT
KIT BASIN OR (CUSTOM PROCEDURE TRAY) ×2 IMPLANT
KIT CATH CPB BARTLE (MISCELLANEOUS) ×2 IMPLANT
KIT SUCTION CATH 14FR (SUCTIONS) ×2 IMPLANT
KIT TURNOVER KIT B (KITS) ×2 IMPLANT
KIT VASOVIEW HEMOPRO 2 VH 4000 (KITS) ×2 IMPLANT
NS IRRIG 1000ML POUR BTL (IV SOLUTION) ×10 IMPLANT
PACK E OPEN HEART (SUTURE) ×2 IMPLANT
PACK OPEN HEART (CUSTOM PROCEDURE TRAY) ×2 IMPLANT
PAD ARMBOARD 7.5X6 YLW CONV (MISCELLANEOUS) ×4 IMPLANT
PAD ELECT DEFIB RADIOL ZOLL (MISCELLANEOUS) ×2 IMPLANT
PENCIL BUTTON HOLSTER BLD 10FT (ELECTRODE) ×2 IMPLANT
POSITIONER HEAD DONUT 9IN (MISCELLANEOUS) ×2 IMPLANT
PUNCH AORTIC ROTATE 4.0MM (MISCELLANEOUS) IMPLANT
PUNCH AORTIC ROTATE 4.5MM 8IN (MISCELLANEOUS) ×2 IMPLANT
PUNCH AORTIC ROTATE 5MM 8IN (MISCELLANEOUS) IMPLANT
SET MPS 3-ND DEL (MISCELLANEOUS) IMPLANT
SPONGE INTESTINAL PEANUT (DISPOSABLE) IMPLANT
SPONGE T-LAP 18X18 ~~LOC~~+RFID (SPONGE) ×8 IMPLANT
SPONGE T-LAP 4X18 ~~LOC~~+RFID (SPONGE) ×4 IMPLANT
SUPPORT HEART JANKE-BARRON (MISCELLANEOUS) ×2 IMPLANT
SUT BONE WAX W31G (SUTURE) ×2 IMPLANT
SUT MNCRL AB 4-0 PS2 18 (SUTURE) IMPLANT
SUT PROLENE 3 0 SH DA (SUTURE) IMPLANT
SUT PROLENE 3 0 SH1 36 (SUTURE) ×2 IMPLANT
SUT PROLENE 4 0 RB 1 (SUTURE)
SUT PROLENE 4 0 SH DA (SUTURE) IMPLANT
SUT PROLENE 4-0 RB1 .5 CRCL 36 (SUTURE) IMPLANT
SUT PROLENE 5 0 C 1 36 (SUTURE) IMPLANT
SUT PROLENE 6 0 C 1 30 (SUTURE) IMPLANT
SUT PROLENE 7 0 BV 1 (SUTURE) IMPLANT
SUT PROLENE 7 0 BV1 MDA (SUTURE) ×2 IMPLANT
SUT PROLENE 8 0 BV175 6 (SUTURE) IMPLANT
SUT SILK  1 MH (SUTURE)
SUT SILK 1 MH (SUTURE) IMPLANT
SUT SILK 2 0 SH (SUTURE) IMPLANT
SUT STEEL STERNAL CCS#1 18IN (SUTURE) IMPLANT
SUT STEEL SZ 6 DBL 3X14 BALL (SUTURE) IMPLANT
SUT VIC AB 1 CTX 27 (SUTURE) IMPLANT
SUT VIC AB 1 CTX 36 (SUTURE) ×4
SUT VIC AB 1 CTX36XBRD ANBCTR (SUTURE) ×4 IMPLANT
SUT VIC AB 2-0 CT1 27 (SUTURE) ×2
SUT VIC AB 2-0 CT1 TAPERPNT 27 (SUTURE) IMPLANT
SUT VIC AB 2-0 CTX 27 (SUTURE) IMPLANT
SUT VIC AB 3-0 SH 27 (SUTURE)
SUT VIC AB 3-0 SH 27X BRD (SUTURE) IMPLANT
SUT VIC AB 3-0 X1 27 (SUTURE) IMPLANT
SUT VICRYL 4-0 PS2 18IN ABS (SUTURE) IMPLANT
SYSTEM SAHARA CHEST DRAIN ATS (WOUND CARE) ×2 IMPLANT
TAPE CLOTH SURG 4X10 WHT LF (GAUZE/BANDAGES/DRESSINGS) IMPLANT
TAPE PAPER 2X10 WHT MICROPORE (GAUZE/BANDAGES/DRESSINGS) IMPLANT
TOWEL GREEN STERILE (TOWEL DISPOSABLE) ×2 IMPLANT
TOWEL GREEN STERILE FF (TOWEL DISPOSABLE) ×2 IMPLANT
TRAY FOLEY SLVR 16FR TEMP STAT (SET/KITS/TRAYS/PACK) ×2 IMPLANT
TUBING LAP HI FLOW INSUFFLATIO (TUBING) ×2 IMPLANT
UNDERPAD 30X36 HEAVY ABSORB (UNDERPADS AND DIAPERS) ×2 IMPLANT
WATER STERILE IRR 1000ML POUR (IV SOLUTION) ×4 IMPLANT

## 2022-07-07 NOTE — Anesthesia Procedure Notes (Signed)
Central Venous Catheter Insertion Performed by: Murvin Natal, MD, anesthesiologist Start/End3/10/2022 6:50 AM, 07/07/2022 7:10 AM Patient location: Pre-op. Preanesthetic checklist: patient identified, IV checked, site marked, risks and benefits discussed, surgical consent, monitors and equipment checked, pre-op evaluation, timeout performed and anesthesia consent Position: Trendelenburg Lidocaine 1% used for infiltration and patient sedated Hand hygiene performed , maximum sterile barriers used  and Seldinger technique used Catheter size: 9 Fr Total catheter length 12. PA cath was placed.MAC introducer Swan type:thermodilution PA Cath depth:45 Procedure performed using ultrasound guided technique. Ultrasound Notes:anatomy identified, needle tip was noted to be adjacent to the nerve/plexus identified and no ultrasound evidence of intravascular and/or intraneural injection Attempts: 1 Following insertion, line sutured, dressing applied and Biopatch. Post procedure assessment: blood return through all ports, free fluid flow and no air  Patient tolerated the procedure well with no immediate complications.

## 2022-07-07 NOTE — Progress Notes (Signed)
[facility] Progress Note: Day of Surgery Procedure(s) (LRB): CORONARY ARTERY BYPASS GRAFTING (CABG) X5 USING OPEN LEFT INTERNAL MAMMARY ARTERY AND ENDOSCOPICALLY HARVESTED RIGHT GREATER SAPHENOUS VEIN (N/A) TRANSESOPHAGEAL ECHOCARDIOGRAM (N/A)  LOS: 0 days   Doing well hemodynamically./  On 2 of dopa making 400cc/hr of urine Getting fluid resuscitatoin as well.  May need to turn down dopa to 1 later if uop continues to be this high.   CVP increased from 5 to 7 with fluid PAD 15 currently     Latest Ref Rng & Units 07/07/2022    8:35 PM 07/07/2022    2:30 PM 07/07/2022    2:25 PM  CBC  WBC 4.0 - 10.5 K/uL 8.6   11.9   Hemoglobin 13.0 - 17.0 g/dL 10.6  10.9  10.4   Hematocrit 39.0 - 52.0 % 32.0  32.0  30.6   Platelets 150 - 400 K/uL 137   137        Latest Ref Rng & Units 07/07/2022    8:35 PM 07/07/2022    2:30 PM 07/07/2022    1:09 PM  CMP  Glucose 70 - 99 mg/dL 103   194   BUN 8 - 23 mg/dL 21   23   Creatinine 0.61 - 1.24 mg/dL 1.38   1.30   Sodium 135 - 145 mmol/L 136  143  138   Potassium 3.5 - 5.1 mmol/L 3.9  3.6  4.5   Chloride 98 - 111 mmol/L 106   105   CO2 22 - 32 mmol/L 20     Calcium 8.9 - 10.3 mg/dL 7.9       ABG    Component Value Date/Time   PHART 7.375 07/07/2022 1430   PCO2ART 32.6 07/07/2022 1430   PO2ART 139 (H) 07/07/2022 1430   HCO3 19.2 (L) 07/07/2022 1430   TCO2 20 (L) 07/07/2022 1430   ACIDBASEDEF 5.0 (H) 07/07/2022 1430   O2SAT 99 07/07/2022 1430    Vent Mode: SIMV;PRVC;PSV FiO2 (%):  [50 %] 50 % Set Rate:  [16 bmp] 16 bmp Vt Set:  [560 mL] 560 mL PEEP:  [5 cmH20] 5 cmH20 Pressure Support:  [10 cmH20] 10 cmH20 Plateau Pressure:  [18 cmH20] 18 cmH20

## 2022-07-07 NOTE — Anesthesia Procedure Notes (Signed)
Arterial Line Insertion Start/End3/10/2022 7:10 AM, 07/07/2022 7:20 AM Performed by: Murvin Natal, MD, anesthesiologist  Patient location: Pre-op. Preanesthetic checklist: patient identified, IV checked, site marked, risks and benefits discussed, surgical consent, monitors and equipment checked, pre-op evaluation, timeout performed and anesthesia consent Lidocaine 1% used for infiltration Left, radial was placed Catheter size: 20 G Hand hygiene performed , maximum sterile barriers used  and Seldinger technique used  Attempts: 1 Procedure performed using ultrasound guided technique. Ultrasound Notes:anatomy identified, needle tip was noted to be adjacent to the nerve/plexus identified and no ultrasound evidence of intravascular and/or intraneural injection Following insertion, dressing applied and Biopatch. Post procedure assessment: normal and unchanged  Patient tolerated the procedure well with no immediate complications.

## 2022-07-07 NOTE — Progress Notes (Signed)
  Echocardiogram Echocardiogram Transesophageal has been performed.  Bobbye Charleston 07/07/2022, 8:58 AM

## 2022-07-07 NOTE — Anesthesia Postprocedure Evaluation (Signed)
Anesthesia Post Note  Patient: Jonathan [name]  Procedure(s) Performed: CORONARY ARTERY BYPASS GRAFTING (CABG) X5 USING OPEN LEFT INTERNAL MAMMARY ARTERY AND ENDOSCOPICALLY HARVESTED RIGHT GREATER SAPHENOUS VEIN (Chest) TRANSESOPHAGEAL ECHOCARDIOGRAM     Patient location during evaluation: SICU Anesthesia Type: General Level of consciousness: sedated Pain management: pain level controlled Vital Signs Assessment: post-procedure vital signs reviewed and stable Respiratory status: patient remains intubated per anesthesia plan Cardiovascular status: stable Postop Assessment: no apparent nausea or vomiting Anesthetic complications: no   No notable events documented.  Last Vitals:  Vitals:   07/07/22 0714 07/07/22 1409  BP:    Pulse: 79 92  Resp: (!) 23 16  Temp:    SpO2: 100% 100%    Last Pain:  Vitals:   07/07/22 0622  TempSrc:   PainSc: 0-No pain                 Keydi Giel P Jalayah Gutridge

## 2022-07-07 NOTE — Transfer of Care (Signed)
Immediate Anesthesia Transfer of Care Note  Patient: Jonathan [name]  Procedure(s) Performed: CORONARY ARTERY BYPASS GRAFTING (CABG) X5 USING OPEN LEFT INTERNAL MAMMARY ARTERY AND ENDOSCOPICALLY HARVESTED RIGHT GREATER SAPHENOUS VEIN (Chest) TRANSESOPHAGEAL ECHOCARDIOGRAM  Patient Location: SICU  Anesthesia Type:General  Level of Consciousness: Patient remains intubated per anesthesia plan  Airway & Oxygen Therapy: Patient remains intubated per anesthesia plan and Patient placed on Ventilator (see vital sign flow sheet for setting)  Post-op Assessment: Report given to RN and Post -op Vital signs reviewed and stable  Post vital signs: Reviewed and stable  Last Vitals:  Vitals Value Taken Time  BP 107/70 07/07/22 1409  Temp    Pulse 91 07/07/22 1416  Resp 16 07/07/22 1416  SpO2 99 % 07/07/22 1416  Vitals shown include unvalidated device data.  Last Pain:  Vitals:   07/07/22 0622  TempSrc:   PainSc: 0-No pain         Complications: No notable events documented.

## 2022-07-07 NOTE — Procedures (Addendum)
Extubation Procedure Note  Patient Details:   Name: Jonathan [name] DOB: 1945/09/12 MRN: [id number]   Airway Documentation:  Airway 8 mm (Active)  Secured at (cm) 22 cm 07/07/22 2241  Measured From Lips 07/07/22 2241  Secured Location Right 07/07/22 2241  Secured By Pink Tape 07/07/22 2241  Prone position No 07/07/22 2241  Cuff Pressure (cm H2O) MOV (Manual Technique) 07/07/22 1951  Site Condition Dry 07/07/22 2241   Vent end date: 07/07/22 Vent end time: 2317   Evaluation  O2 sats: stable throughout Complications: No apparent complications Patient did tolerate procedure well. Bilateral Breath Sounds: Clear, Diminished   Yes  Pt performed a NIF of -23 and VC 1.74L. Cuff leak heard upon auscultation. Pt was able to stick out tongue and raise head off of pillow. Extubated to [facility] with RN at bedside. Post extubation pt is able to state his name and knows where he is at this time.  [name] 07/07/2022, 11:31 PM

## 2022-07-07 NOTE — Op Note (Signed)
CARDIOVASCULAR SURGERY OPERATIVE NOTE  07/07/2022  Surgeon:  Gaye Pollack, MD  First Assistant: Enid Cutter,  PA-C:   An experienced assistant was required given the complexity of this surgery and the standard of surgical care. The assistant was needed for endoscopic vein harvest, exposure, dissection, suctioning, retraction of delicate tissues and sutures, instrument exchange and for overall help during this procedure.   Preoperative Diagnosis:  Severe multi-vessel coronary artery disease   Postoperative Diagnosis:  Same   Procedure:  Median Sternotomy Extracorporeal circulation 3.   Coronary artery bypass grafting x 5  Left internal mammary artery graft to the LAD SVG to diagonal Sequential SVG to OM1 and OM2 SVG to PDA 4.   Endoscopic vein harvest from the right leg   Anesthesia:  General Endotracheal   Clinical History/Surgical Indication:  This 77 year old gentleman has severe multivessel coronary disease with low normal left ventricular ejection fraction and worsening symptoms of exertional substernal chest discomfort, shortness of breath, and fatigue.  I agree that coronary artery bypass graft surgery is indicated for relief of his symptoms and to prevent myocardial loss.  His operative risk is increased due to stage III chronic kidney disease with a creatinine of 1.63 and severe lower extremity vascular disease with rest pain in his left foot. I discussed the operative procedure with the patient and his wife including alternatives, benefits and risks; including but not limited to bleeding, blood transfusion, infection, stroke, myocardial infarction, graft failure, heart block requiring a permanent pacemaker, organ dysfunction, and death.  Jonathan [name] understands and agrees to proceed.     Preparation:  The patient was seen in the preoperative holding area and the  correct patient, correct operation were confirmed with the patient after reviewing the medical record and catheterization. The consent was signed by me. Preoperative antibiotics were given. A pulmonary arterial line and radial arterial line were placed by the anesthesia team. The patient was taken back to the operating room and positioned supine on the operating room table. After being placed under general endotracheal anesthesia by the anesthesia team a foley catheter was placed. The neck, chest, abdomen, and both legs were prepped with betadine soap and solution and draped in the usual sterile manner. A surgical time-out was taken and the correct patient and operative procedure were confirmed with the nursing and anesthesia staff.   Cardiopulmonary Bypass:  A median sternotomy was performed. The pericardium was opened in the midline. 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 These were divided sharply to expose the right atrium and ascending aorta. Right ventricular function appeared normal. The ascending aorta was of normal size and had no palpable plaque. There were no contraindications to aortic cannulation or cross-clamping. The patient was fully systemically heparinized and the ACT was maintained > 400 sec. The proximal aortic arch was cannulated with a 58 F aortic cannula for arterial inflow. Venous cannulation was performed via the right atrial appendage using a two-staged venous cannula. An antegrade cardioplegia/vent cannula was inserted into the mid-ascending aorta. Aortic occlusion was performed with a single cross-clamp. Systemic cooling to 32 degrees Centigrade and topical cooling of the heart with iced saline were used. Hyperkalemic antegrade cold blood cardioplegia was used to induce diastolic arrest and was then given at about 20 minute intervals throughout the period of arrest to maintain myocardial temperature at or below 10 degrees  centigrade. A temperature probe was inserted into the interventricular septum and an insulating pad was placed in  the pericardium.   Left internal mammary artery harvest:  The left side of the sternum was retracted using the Rultract retractor. The left pleural space had multiple areas of adhesion between the left lung and the chest wall. The left internal mammary artery was harvested as a pedicle graft. All side branches were clipped. It was a medium-sized vessel of good quality with excellent blood flow. It was ligated distally and divided. It was sprayed with topical papaverine solution to prevent vasospasm.   Endoscopic vein harvest:  The right greater saphenous vein was harvested endoscopically through a 2 cm incision medial to the right knee. It was harvested from the upper thigh to below the knee. It was a medium-sized vein of good quality. The side branches were all ligated with 4-0 silk ties.    Coronary arteries:  The coronary arteries were examined.  LAD:  large vessel with minimal distal disease. The diagonal was a medium sized vessel with no distal disease LCX:  OM1 and OM2 both medium sized vessels with no distal disease RCA:  PDA was a large vessel that had some segmental proximal disease. PL was diffusely diseased.   Grafts:  LIMA to the LAD: 2.5 mm. It was sewn end to side using 8-0 prolene continuous suture. SVG to diagonal:  1.6 mm. It was sewn end to side using 7-0 prolene continuous suture. Sequential SVG to OM1:  1.6 mm. It was sewn sequential side to side using 7-0 prolene continuous suture. Sequential SVG to OM2:  1.6 mm. It was sewn sequential end to side using 7-0 prolene continuous suture. SVG to PDA:  1.75 mm. It was sewn end to side using 7-0 prolene continuous suture.  The proximal vein graft anastomoses were performed to the mid-ascending aorta using continuous 6-0 prolene suture. Graft markers were placed around the proximal  anastomoses.   Completion:  The patient was rewarmed to 37 degrees Centigrade. The clamp was removed from the LIMA pedicle and there was rapid warming of the septum and return of ventricular fibrillation. The crossclamp was removed with a time of 107 minutes. There was spontaneous return of sinus rhythm. The distal and proximal anastomoses were checked for hemostasis. The position of the grafts was satisfactory. Two temporary epicardial pacing wires were placed on the right atrium and two on the right ventricle. The patient was weaned from CPB without difficulty on dopamine 2 mcg that was run on pump to augment renal perfusion. CPB time was 125 minutes. Cardiac output was 5 LPM. TEE showed normal LV systolic function. Heparin was fully reversed with protamine and the aortic and venous cannulas removed. Hemostasis was achieved. Mediastinal and left pleural drainage tubes were placed. The sternum was closed with double #6 stainless steel wires. The fascia was closed with continuous # 1 vicryl suture. The subcutaneous tissue was closed with 2-0 vicryl continuous suture. The skin was closed with 3-0 vicryl subcuticular suture. All sponge, needle, and instrument counts were reported correct at the end of the case. Dry sterile dressings were placed over the incisions and around the chest tubes which were connected to pleurevac suction. The patient was then transported to the surgical intensive care unit in stable condition.

## 2022-07-07 NOTE — Brief Op Note (Signed)
07/07/2022  12:06 PM  PATIENT:  Jonathan [name]  77 y.o. male  PRE-OPERATIVE DIAGNOSIS:  Coronary Artery Disease  POST-OPERATIVE DIAGNOSIS:  Coronary Artery Disease  PROCEDURE:  CORONARY ARTERY BYPASS GRAFTING (CABG) X5 USING OPEN LEFT INTERNAL MAMMARY ARTERY AND ENDOSCOPICALLY HARVEST RIGHT GREATER SAPHENOUS VEIN   LIMA-LAD SVG-D1 SVG-OM2-OM3 (sequential) SVG-PDA   Vein harvest time: 57mn Vein prep time: 242m  TRANSESOPHAGEAL ECHOCARDIOGRAM (N/A)  SURGEON:  BaGaye PollackMD - Primary  PHYSICIAN ASSISTANT: Jaquita Bessire  ASSISTANTS: HaBuzzy HanRN, RN First Assistant    ANESTHESIA:   general  EBL: [id number] ADMINISTERED:none  DRAINS:  Mediastinal and left pleural tubes    LOCAL MEDICATIONS USED:  NONE  COUNTS:  Correct  DICTATION: .Dragon Dictation  PLAN OF CARE: Admit to inpatient   PATIENT DISPOSITION:  ICU - intubated and hemodynamically stable.   Delay start of Pharmacological VTE agent (>24hrs) due to surgical blood loss or risk of bleeding: yes

## 2022-07-07 NOTE — Hospital Course (Addendum)
PCP is [name], MD Referring Provider is Wellington Hampshire, MD    History of Present Illness:   The patient is a 77 year old gentleman with a history of type 2 diabetes, hypertension, hyperlipidemia, stage IIIb chronic kidney disease, rheumatoid arthritis on prednisone and methotrexate, peripheral vascular disease with rest pain in his left foot followed by Dr. Virl Cagey and coronary artery disease.  He underwent aortoiliac and lower extremity angiography by Dr. Fletcher Anon showing that the tibial and peroneal vessels in the left lower extremity were occluded with reconstitution of the anterior tibial and posterior tibial arteries by collaterals just above the ankle.  He was seen by Dr. Virl Cagey for possible bypass but was felt borderline quality vein conduit for distal bypass.  He was also complaining of a few month history of shortness of breath and fatigue as well as substernal chest burning that he thought was heartburn.  This has been occurring after eating but also during exertion such as walking.  2D echocardiogram showed a low normal ejection fraction of 50 to 55% with grade 1 diastolic dysfunction and mild LVH.  There is no significant valvular abnormality.  A nuclear stress test was abnormal with evidence of prior infarction in the apical to basal inferolateral region.  There was no ischemia.  Ejection fraction was 53%.  He subsequently underwent cardiac catheterization on 06/24/2022 showing severe diffuse three-vessel coronary disease.  The patient had 90% ostial LAD stenosis.  The proximal to mid LAD was diffusely diseased with 80% mid vessel stenosis.  There was a diagonal branch that had 80% ostial stenosis.  Left circumflex had 80% ostial to proximal stenosis as well as a 50% mid vessel stenosis.  There were 2 moderate-sized marginal branches.  The right coronary had 70% proximal PDA stenosis.  Right heart catheterization showed a PA pressure of 56/23 with a mean of 37.  Mean right atrial pressure  was 7 mmHg.  Cardiac index was 3.54.   The patient lives with his wife.  He has not very active due to rheumatoid arthritis as well as his lower extremity pain.  He has never smoked.  His wife reports that he has been sleeping more than usual and frequently falls asleep whenever he sits down.  She feels like he has been getting more short of breath with time.  Hospital Course: Coronary bypass grafting was discussed with Mr. Kuyper by Dr. Cyndia Bent and Mr. Fabris elected to proceed with surgery. He was admitted for elective surgery on 07/07/22 and taken to the OR where CABG x 5 was carried out without complication.  Following the procedure, he separated from cardiopulmonary bypass without difficulty on dopamine at 2 mcg/kg/min that was used to optimize renal perfusion.  He was transferred to the surgical ICU.  Vital signs and hemodynamics remained stable.  He was weaned from mechanical ventilator and extubated by 11 PM on the day of surgery.  His chest tubes and monitoring lines were removed on the first postoperative day. The dopamine was discontinued. His renal function remained stable. He was given stress dose steroids for the first 24 hours after surgery then started back on his usual prednisone 1 mg daily on postop day 2. He was hyperglycemic, glipizide was restarted as well as mealtime and bedtime coverage. He was felt stable for transfer to the progressive unit. He then became hypoglycemic, SSI was adjusted to a sensitive scale and his glipizide was held. He remained in the ICU pending an available 4E progressive unit bed.

## 2022-07-07 NOTE — Interval H&P Note (Signed)
History and Physical Interval Note:  07/07/2022 6:29 AM  Jonathan [name]  has presented today for surgery, with the diagnosis of CAD.  The various methods of treatment have been discussed with the patient and family. After consideration of risks, benefits and other options for treatment, the patient has consented to  Procedure(s): CORONARY ARTERY BYPASS GRAFTING (CABG) (N/A) TRANSESOPHAGEAL ECHOCARDIOGRAM (N/A) as a surgical intervention.  The patient's history has been reviewed, patient examined, no change in status, stable for surgery.  I have reviewed the patient's chart and labs.  Questions were answered to the patient's satisfaction.     Gaye Pollack

## 2022-07-07 NOTE — Anesthesia Procedure Notes (Signed)
Procedure Name: Intubation Date/Time: 07/07/2022 7:59 AM  Performed by: Thelma Comp, CRNAPre-anesthesia Checklist: Patient identified, Emergency Drugs available, Suction available and Patient being monitored Patient Re-evaluated:Patient Re-evaluated prior to induction Oxygen Delivery Method: Circle System Utilized Preoxygenation: Pre-oxygenation with 100% oxygen Induction Type: IV induction Ventilation: Mask ventilation without difficulty Laryngoscope Size: Mac and 4 Grade View: Grade I Tube type: Oral Tube size: 8.0 mm Number of attempts: 1 Airway Equipment and Method: Stylet Placement Confirmation: ETT inserted through vocal cords under direct vision, positive ETCO2 and breath sounds checked- equal and bilateral Secured at: 23 cm Tube secured with: Tape Dental Injury: Teeth and Oropharynx as per pre-operative assessment

## 2022-07-08 ENCOUNTER — Encounter (HOSPITAL_COMMUNITY): Payer: Self-pay | Admitting: Surgery

## 2022-07-08 ENCOUNTER — Inpatient Hospital Stay (HOSPITAL_COMMUNITY): Payer: 59

## 2022-07-08 LAB — BASIC METABOLIC PANEL
Anion gap: 7 (ref 5–15)
Anion gap: 11 (ref 5–15)
BUN: 20 mg/dL (ref 8–23)
BUN: 22 mg/dL (ref 8–23)
CO2: 24 mmol/L (ref 22–32)
CO2: 21 mmol/L — ABNORMAL LOW (ref 22–32)
Calcium: 7.9 mg/dL — ABNORMAL LOW (ref 8.9–10.3)
Calcium: 7.9 mg/dL — ABNORMAL LOW (ref 8.9–10.3)
Chloride: 107 mmol/L (ref 98–111)
Chloride: 101 mmol/L (ref 98–111)
Creatinine, Ser: 1.5 mg/dL — ABNORMAL HIGH (ref 0.61–1.24)
Creatinine, Ser: 1.8 mg/dL — ABNORMAL HIGH (ref 0.61–1.24)
GFR, Estimated: 49 mL/min — ABNORMAL LOW (ref >60)
GFR, Estimated: 39 mL/min — ABNORMAL LOW (ref >60)
Glucose, Bld: 121 mg/dL — ABNORMAL HIGH (ref 70–99)
Glucose, Bld: 227 mg/dL — ABNORMAL HIGH (ref 70–99)
Potassium: 4.2 mmol/L (ref 3.5–5.1)
Potassium: 4.1 mmol/L (ref 3.5–5.1)
Sodium: 138 mmol/L (ref 135–145)
Sodium: 133 mmol/L — ABNORMAL LOW (ref 135–145)

## 2022-07-08 LAB — CBC
HCT: 27.4 % — ABNORMAL LOW (ref 39.0–52.0)
HCT: 28.8 % — ABNORMAL LOW (ref 39.0–52.0)
Hemoglobin: 9 g/dL — ABNORMAL LOW (ref 13.0–17.0)
Hemoglobin: 9.1 g/dL — ABNORMAL LOW (ref 13.0–17.0)
MCH: 30.1 pg (ref 26.0–34.0)
MCH: 29.9 pg (ref 26.0–34.0)
MCHC: 32.8 g/dL (ref 30.0–36.0)
MCHC: 31.6 g/dL (ref 30.0–36.0)
MCV: 91.6 fL (ref 80.0–100.0)
MCV: 94.7 fL (ref 80.0–100.0)
Platelets: 130 10*3/uL — ABNORMAL LOW (ref 150–400)
Platelets: 141 10*3/uL — ABNORMAL LOW (ref 150–400)
RBC: 2.99 MIL/uL — ABNORMAL LOW (ref 4.22–5.81)
RBC: 3.04 MIL/uL — ABNORMAL LOW (ref 4.22–5.81)
RDW: 15.6 % — ABNORMAL HIGH (ref 11.5–15.5)
RDW: 16 % — ABNORMAL HIGH (ref 11.5–15.5)
WBC: 9.7 10*3/uL (ref 4.0–10.5)
WBC: 12.7 10*3/uL — ABNORMAL HIGH (ref 4.0–10.5)
nRBC: 0 % (ref 0.0–0.2)
nRBC: 0 % (ref 0.0–0.2)

## 2022-07-08 LAB — POCT I-STAT 7, (LYTES, BLD GAS, ICA,H+H)
Acid-base deficit: 2 mmol/L (ref 0.0–2.0)
Acid-base deficit: 3 mmol/L — ABNORMAL HIGH (ref 0.0–2.0)
Bicarbonate: 21.6 mmol/L (ref 20.0–28.0)
Bicarbonate: 21.8 mmol/L (ref 20.0–28.0)
Calcium, Ion: 1.11 mmol/L — ABNORMAL LOW (ref 1.15–1.40)
Calcium, Ion: 1.11 mmol/L — ABNORMAL LOW (ref 1.15–1.40)
HCT: 29 % — ABNORMAL LOW (ref 39.0–52.0)
HCT: 30 % — ABNORMAL LOW (ref 39.0–52.0)
Hemoglobin: 10.2 g/dL — ABNORMAL LOW (ref 13.0–17.0)
Hemoglobin: 9.9 g/dL — ABNORMAL LOW (ref 13.0–17.0)
O2 Saturation: 100 %
O2 Saturation: 96 %
Patient temperature: 36.3
Patient temperature: 36.4
Potassium: 4 mmol/L (ref 3.5–5.1)
Potassium: 4.3 mmol/L (ref 3.5–5.1)
Sodium: 138 mmol/L (ref 135–145)
Sodium: 140 mmol/L (ref 135–145)
TCO2: 23 mmol/L (ref 22–32)
TCO2: 23 mmol/L (ref 22–32)
pCO2 arterial: 31.5 mmHg — ABNORMAL LOW (ref 32–48)
pCO2 arterial: 34.2 mmHg (ref 32–48)
pH, Arterial: 7.405 (ref 7.35–7.45)
pH, Arterial: 7.445 (ref 7.35–7.45)
pO2, Arterial: 172 mmHg — ABNORMAL HIGH (ref 83–108)
pO2, Arterial: 81 mmHg — ABNORMAL LOW (ref 83–108)

## 2022-07-08 LAB — GLUCOSE, CAPILLARY
Glucose-Capillary: 117 mg/dL — ABNORMAL HIGH (ref 70–99)
Glucose-Capillary: 119 mg/dL — ABNORMAL HIGH (ref 70–99)
Glucose-Capillary: 120 mg/dL — ABNORMAL HIGH (ref 70–99)
Glucose-Capillary: 126 mg/dL — ABNORMAL HIGH (ref 70–99)
Glucose-Capillary: 127 mg/dL — ABNORMAL HIGH (ref 70–99)
Glucose-Capillary: 128 mg/dL — ABNORMAL HIGH (ref 70–99)
Glucose-Capillary: 143 mg/dL — ABNORMAL HIGH (ref 70–99)
Glucose-Capillary: 152 mg/dL — ABNORMAL HIGH (ref 70–99)
Glucose-Capillary: 156 mg/dL — ABNORMAL HIGH (ref 70–99)
Glucose-Capillary: 185 mg/dL — ABNORMAL HIGH (ref 70–99)
Glucose-Capillary: 190 mg/dL — ABNORMAL HIGH (ref 70–99)
Glucose-Capillary: 200 mg/dL — ABNORMAL HIGH (ref 70–99)

## 2022-07-08 LAB — MAGNESIUM
Magnesium: 2.8 mg/dL — ABNORMAL HIGH (ref 1.7–2.4)
Magnesium: 2.6 mg/dL — ABNORMAL HIGH (ref 1.7–2.4)

## 2022-07-08 MED ORDER — VITAMIN E 45 MG (100 UNIT) PO CAPS
400.0000 [IU] | ORAL_CAPSULE | Freq: Every day | ORAL | Status: DC
  Administered 2022-07-08 – 2022-07-12 (×5): 400 [IU] via ORAL
  Filled 2022-07-08 (×6): qty 4

## 2022-07-08 MED ORDER — INSULIN DETEMIR 100 UNIT/ML ~~LOC~~ SOLN
15.0000 [IU] | Freq: Two times a day (BID) | SUBCUTANEOUS | Status: DC
  Administered 2022-07-08: 15 [IU] via SUBCUTANEOUS
  Filled 2022-07-08 (×3): qty 0.15

## 2022-07-08 MED ORDER — FINASTERIDE 5 MG PO TABS
5.0000 mg | ORAL_TABLET | Freq: Every day | ORAL | Status: DC
  Administered 2022-07-08: 5 mg via ORAL
  Filled 2022-07-08: qty 1

## 2022-07-08 MED ORDER — VITAMIN D 25 MCG (1000 UNIT) PO TABS
1000.0000 [IU] | ORAL_TABLET | Freq: Every day | ORAL | Status: DC
  Administered 2022-07-08 – 2022-07-12 (×5): 1000 [IU] via ORAL
  Filled 2022-07-08 (×5): qty 1

## 2022-07-08 MED ORDER — INSULIN DETEMIR 100 UNIT/ML ~~LOC~~ SOLN
15.0000 [IU] | Freq: Every day | SUBCUTANEOUS | Status: DC
  Administered 2022-07-08: 15 [IU] via SUBCUTANEOUS
  Filled 2022-07-08: qty 0.15

## 2022-07-08 MED ORDER — FOLIC ACID 1 MG PO TABS
1.0000 mg | ORAL_TABLET | Freq: Every day | ORAL | Status: DC
  Administered 2022-07-08 – 2022-07-12 (×5): 1 mg via ORAL
  Filled 2022-07-08 (×5): qty 1

## 2022-07-08 MED ORDER — PREDNISONE 1 MG PO TABS
1.0000 mg | ORAL_TABLET | Freq: Every day | ORAL | Status: DC
  Administered 2022-07-09 – 2022-07-12 (×4): 1 mg via ORAL
  Filled 2022-07-08 (×4): qty 1

## 2022-07-08 MED ORDER — ORAL CARE MOUTH RINSE: 15.0000 mL | OROMUCOSAL | Status: DC | PRN

## 2022-07-08 MED ORDER — HYDRALAZINE HCL 20 MG/ML IJ SOLN
10.0000 mg | Freq: Four times a day (QID) | INTRAMUSCULAR | Status: DC | PRN
  Administered 2022-07-08: 10 mg via INTRAVENOUS
  Filled 2022-07-08: qty 1

## 2022-07-08 MED ORDER — GABAPENTIN 300 MG PO CAPS
300.0000 mg | ORAL_CAPSULE | Freq: Every day | ORAL | Status: DC
  Administered 2022-07-08: 300 mg via ORAL
  Filled 2022-07-08: qty 1

## 2022-07-08 MED ORDER — METOPROLOL TARTRATE 50 MG PO TABS
50.0000 mg | ORAL_TABLET | Freq: Two times a day (BID) | ORAL | Status: DC
  Administered 2022-07-08 – 2022-07-12 (×8): 50 mg via ORAL
  Filled 2022-07-08 (×8): qty 1

## 2022-07-08 MED ORDER — INSULIN ASPART 100 UNIT/ML IJ SOLN: 0.0000 [IU] | INTRAMUSCULAR | Status: DC

## 2022-07-08 MED ORDER — INSULIN ASPART 100 UNIT/ML IJ SOLN
0.0000 [IU] | INTRAMUSCULAR | Status: DC
  Administered 2022-07-08: 2 [IU] via SUBCUTANEOUS
  Administered 2022-07-08 (×3): 4 [IU] via SUBCUTANEOUS

## 2022-07-08 MED ORDER — ENOXAPARIN SODIUM 40 MG/0.4ML IJ SOSY
40.0000 mg | PREFILLED_SYRINGE | Freq: Every day | INTRAMUSCULAR | Status: DC
  Administered 2022-07-08 – 2022-07-11 (×4): 40 mg via SUBCUTANEOUS
  Filled 2022-07-08 (×4): qty 0.4

## 2022-07-08 MED ORDER — VITAMIN B-12 1000 MCG PO TABS
1000.0000 ug | ORAL_TABLET | Freq: Every day | ORAL | Status: DC
  Administered 2022-07-08 – 2022-07-12 (×5): 1000 ug via ORAL
  Filled 2022-07-08 (×5): qty 1

## 2022-07-08 MED FILL — Thrombin (Recombinant) For Soln 20000 Unit: CUTANEOUS | Qty: 1 | Status: AC

## 2022-07-08 NOTE — Progress Notes (Addendum)
1 Day Post-Op Procedure(s) (LRB): CORONARY ARTERY BYPASS GRAFTING (CABG) X5 USING OPEN LEFT INTERNAL MAMMARY ARTERY AND ENDOSCOPICALLY HARVESTED RIGHT GREATER SAPHENOUS VEIN (N/A) TRANSESOPHAGEAL ECHOCARDIOGRAM (N/A) Subjective: No complaints.   Objective: Vital signs in last 24 hours: Temp:  [96.6 F (35.9 C)-97.7 F (36.5 C)] 97.7 F (36.5 C) (03/08 0630) Pulse Rate:  [59-92] 70 (03/08 0630) Cardiac Rhythm: Normal sinus rhythm (03/08 0507) Resp:  [12-31] 22 (03/08 0630) BP: (104-173)/(64-110) 126/74 (03/08 0630) SpO2:  [88 %-100 %] 99 % (03/08 0630) Arterial Line BP: (83-155)/(69-96) 108/96 (03/08 0500) FiO2 (%):  [40 %-50 %] 40 % (03/07 2241) Weight:  [98.7 kg] 98.7 kg (03/08 0135)  Hemodynamic parameters for last 24 hours: PAP: (20-36)/(3-17) 27/14 CVP:  [4 mmHg-12 mmHg] 5 mmHg CO:  [2.9 L/min-4.2 L/min] 3.9 L/min CI:  [1.3 L/min/m2-2 L/min/m2] 1.8 L/min/m2  Intake/Output from previous day: 03/07 0701 - 03/08 0700 In: 6354.8 [I.V.:4200.4; Blood:310; IV Piggyback:1844.4] Out: [id number] [Urine:6580; Emesis/NG output:50; Blood:630; Chest Tube:310] Intake/Output this shift: Total I/O In: 1108.1 [I.V.:544.6; IV Piggyback:563.5] Out: 970 [Urine:750; Emesis/NG output:50; Chest Tube:170]  General appearance: alert and cooperative Neurologic: intact Heart: regular rate and rhythm, S1, S2 normal, no murmur Lungs: clear to auscultation bilaterally Extremities: edema minimal Wound: dressing dry  Lab Results: Recent Labs    07/07/22 2035 07/07/22 2309 07/08/22 0016 07/08/22 0435  WBC 8.6  --   --  9.7  HGB 10.6*   < > 9.9* 9.0*  HCT 32.0*   < > 29.0* 27.4*  PLT 137*  --   --  130*   < > = values in this interval not displayed.   BMET:  Recent Labs    07/07/22 2035 07/07/22 2309 07/08/22 0016 07/08/22 0435  NA 136   < > 140 138  K 3.9   < > 4.0 4.2  CL 106  --   --  107  CO2 20*  --   --  24  GLUCOSE 103*  --   --  121*  BUN 21  --   --  20  CREATININE 1.38*  --    --  1.50*  CALCIUM 7.9*  --   --  7.9*   < > = values in this interval not displayed.    PT/INR:  Recent Labs    07/07/22 1425  LABPROT 16.8*  INR 1.4*   ABG    Component Value Date/Time   PHART 7.405 07/08/2022 0016   HCO3 21.6 07/08/2022 0016   TCO2 23 07/08/2022 0016   ACIDBASEDEF 3.0 (H) 07/08/2022 0016   O2SAT 96 07/08/2022 0016   CBG (last 3)  Recent Labs    07/08/22 0206 07/08/22 0257 07/08/22 0441  GLUCAP 126* 128* 117*   CXR: clear  ECG: sinus, 1st degree AV block  Assessment/Plan: S/P Procedure(s) (LRB): CORONARY ARTERY BYPASS GRAFTING (CABG) X5 USING OPEN LEFT INTERNAL MAMMARY ARTERY AND ENDOSCOPICALLY HARVESTED RIGHT GREATER SAPHENOUS VEIN (N/A) TRANSESOPHAGEAL ECHOCARDIOGRAM (N/A)  POD 1 Hemodynamically stable in sinus rhythm. Continue Lopressor.  Stage 3 CKD with baseline creat 1.5-1.6. He has had excellent UO on dopamine 2. Creat stable at baseline. DC dopamine. No diuresis today since he is at baseline wt.  DM: glucose under good control. Transition to Levemir and SSI.  Rheumatoid arthritis: on Prednisone 1 mg and methotrexate at home. Giving periop hydrocortisone and will resume prednisone tomorrow. Holding methotrexate for a few weeks.  Severe PVD with rest pain in left foot. Followed by vascular. Nothing to do at this  time except avoid injury.  DC chest tubes, swan, arterial line.  Hx of BPH on Proscar and Rapaflo at home. We had difficulty getting foley in but eventually got a coude cath in. Will leave in for a few days and resume prostate meds.  IS, OOB, mobilize.   LOS: 1 day    Gaye Pollack 07/08/2022

## 2022-07-08 NOTE — Discharge Instructions (Signed)
Discharge Instructions:  1. You may shower, please wash incisions daily with soap and water and keep dry.  If you wish to cover wounds with dressing you may do so but please keep clean and change daily.  No tub baths or swimming until incisions have completely healed.  If your incisions become red or develop any drainage please call our office at [phone]  2. No Driving until cleared by Dr. Sharee Pimple office and you are no longer using narcotic pain medications  3. Monitor your weight daily.. Please use the same scale and weigh at same time... If you gain 5-10 lbs in 48 hours with associated lower extremity swelling, please contact our office at [phone]  4. Fever of 101.5 for at least 24 hours with no source, please contact our office at [phone]  5. Activity- up as tolerated, please walk at least 3 times per day.  Avoid strenuous activity, no lifting, pushing, or pulling with your arms over 8-10 lbs for a minimum of 6 weeks  6. Re-start your Methotrexate on Sunday, 07/24/22   7. If any questions or concerns arise, please do not hesitate to contact our office at [phone]

## 2022-07-08 NOTE — Progress Notes (Signed)
Patient stood at bedside. Per MD order, chest tubes removed today at 1525. Purse string sutures tied and new bandage placed. Patient on bed rest for 1 hour. Patient had no complications. Pacing wires secured but not attached to pacer. Pacer remains in room. A-line removed.

## 2022-07-08 NOTE — Progress Notes (Signed)
      [address]       [place] [address]             [phone]      POD # 1 CABG x 5  Eating dinner  BP (!) 158/81   Pulse 76   Temp 98.1 F (36.7 C)   Resp (!) 36   Ht 5\' 9"  (1.753 m)   Wt 98.7 kg   SpO2 99%   BMI 32.13 kg/m  PA pressures OK, Swan to be removed this evening  Intake/Output Summary (Last 24 hours) at 07/08/2022 1713 Last data filed at 07/08/2022 1700 Gross per 24 hour  Intake 3423.86 ml  Output 2125 ml  Net 1298.86 ml   CBG slowly rising - up to 200 this evening Will give additional Levemir this evening Continue SSI  Elius Etheredge C. Roxan Hockey, MD [facility] [phone]

## 2022-07-08 NOTE — Progress Notes (Signed)
Per MD order, swan removed today at 1800. Patient now sitting upright in chair.

## 2022-07-08 NOTE — Discharge Summary (Signed)
Physician Discharge Summary  Patient ID: Jonathan [name] MRN: [id number] DOB/AGE: [birth year] 77 y.o.  Admit date: 07/07/2022 Discharge date: 07/08/2022  Admission Diagnoses:   Multivessel coronary artery disease Angina pectoris LV dysfunction Stage III CKD Dyslipidemia Type 2 diabetes mellitus Hypertension Peripheral vascular disease History of benign prostatic hyperplasia Rheumatoid arthritis, on chronic steroids and methotrexate  Discharge Diagnoses:   Multivessel coronary artery disease Angina pectoris LV dysfunction Stage III CKD Dyslipidemia Type 2 diabetes mellitus Hypertension Peripheral vascular disease Rheumatoid arthritis, on chronic steroids and methotrexate History of benign prostatic hyperplasia Difficult Foley catheter insertion S/P CABG x 5   Discharged Condition: {condition:18240}  PCP is Jonathan Spar, [name] Referring Provider is Jonathan Hampshire, [name]    History of Present Illness:   The patient is a 77 year old gentleman with a history of type 2 diabetes, hypertension, hyperlipidemia, stage IIIb chronic kidney disease, rheumatoid arthritis on prednisone and methotrexate, peripheral vascular disease with rest pain in his left foot followed by Dr. Virl [name] and coronary artery disease.  He underwent aortoiliac and lower extremity angiography by Dr. Fletcher [name] showing that the tibial and peroneal vessels in the left lower extremity were occluded with reconstitution of the anterior tibial and posterior tibial arteries by collaterals just above the ankle.  He was seen by Dr. Virl [name] for possible bypass but was felt borderline quality vein conduit for distal bypass.  He was also complaining of a few month history of shortness of breath and fatigue as well as substernal chest burning that he thought was heartburn.  This has been occurring after eating but also during exertion such as walking.  2D echocardiogram showed a low normal ejection fraction of 50 to 55% with grade 1  diastolic dysfunction and mild LVH.  There is no significant valvular abnormality.  A nuclear stress test was abnormal with evidence of prior infarction in the apical to basal inferolateral region.  There was no ischemia.  Ejection fraction was 53%.  He subsequently underwent cardiac catheterization on 06/24/2022 showing severe diffuse three-vessel coronary disease.  The patient had 90% ostial LAD stenosis.  The proximal to mid LAD was diffusely diseased with 80% mid vessel stenosis.  There was a diagonal branch that had 80% ostial stenosis.  Left circumflex had 80% ostial to proximal stenosis as well as a 50% mid vessel stenosis.  There were 2 moderate-sized marginal branches.  The right coronary had 70% proximal PDA stenosis.  Right heart catheterization showed a PA pressure of 56/23 with a mean of 37.  Mean right atrial pressure was 7 mmHg.  Cardiac index was 3.54.   The patient lives with his wife.  He has not very active due to rheumatoid arthritis as well as his lower extremity pain.  He has never smoked.  His wife reports that he has been sleeping more than usual and frequently falls asleep whenever he sits down.  She feels like he has been getting more short of breath with time.   Hospital Course: Coronary bypass grafting was discussed with Jonathan [name] by Jonathan [name] and Jonathan [name] elected to proceed with surgery.  He was admitted for elective surgery on 07/07/22.  There was some difficulty in getting a Foley catheter inserted but a Coude catheter was eventually placed with fre-flow of urine and no significant hematuria. CABG x 5 was then carried out without complication.  Following the procedure, he separated from cardiopulmonary bypass without difficulty on dopamine at 2 mcg/kg/min that was used to optimize renal perfusion.  He  was transferred to the surgical ICU.  Vital signs and hemodynamics remained stable.  He was weaned from mechanical ventilator and extubated by 11 PM on the day of surgery.  His  chest tubes and monitoring lines were removed on the first postoperative day.  The dopamine was discontinued.  His renal function remained stable.  He was given stress dose steroids for the first 24 hours after surgery then started back on his usual prednisone 1 mg daily on postop day 2.  Consults: {consultation:18241}  Significant Diagnostic Studies: {diagnostics:18242}  Treatments: Surgery  CARDIOVASCULAR SURGERY OPERATIVE NOTE   07/07/2022   Surgeon:  Jonathan [name]   First Assistant: Jonathan Cutter,  PA-C:   An experienced assistant was required given the complexity of this surgery and the standard of surgical care. The assistant was needed for endoscopic vein harvest, exposure, dissection, suctioning, retraction of delicate tissues and sutures, instrument exchange and for overall help during this procedure.     Preoperative Diagnosis:  Severe multi-vessel coronary artery disease     Postoperative Diagnosis:  Same     Procedure:   Median Sternotomy Extracorporeal circulation 3.   Coronary artery bypass grafting x 5   Left internal mammary artery graft to the LAD SVG to diagonal Sequential SVG to OM1 and OM2 SVG to PDA 4.   Endoscopic vein harvest from the right leg     Anesthesia:  General Endotracheal     Clinical History/Surgical Indication:   This 77 year old gentleman has severe multivessel coronary disease with low normal left ventricular ejection fraction and worsening symptoms of exertional substernal chest discomfort, shortness of breath, and fatigue.  I agree that coronary artery bypass graft surgery is indicated for relief of his symptoms and to prevent myocardial loss.  His operative risk is increased due to stage III chronic kidney disease with a creatinine of 1.63 and severe lower extremity vascular disease with rest pain in his left foot. I discussed the operative procedure with the patient and his wife including alternatives, benefits and risks;  including but not limited to bleeding, blood transfusion, infection, stroke, myocardial infarction, graft failure, heart block requiring a permanent pacemaker, organ dysfunction, and death.  Boomer L Spade understands and agrees to proceed.     Discharge Exam: Blood pressure (!) 163/79, pulse 76, temperature 97.7 F (36.5 C), resp. rate (!) 25, height '5\' 9"'$  (1.753 m), weight 98.7 kg, SpO2 100 %. {physical SA:[id number]  Disposition:  There are no questions and answers to display.         Allergies as of 07/08/2022       Reactions   Ciprofloxacin Itching   Mosquito (diagnostic) Itching   Oxycodone-acetaminophen Rash     Med Rec must be completed prior to using this [facility]***       Follow-up Information     [facility]. Go on 07/20/2022.   Specialty: Cardiothoracic Surgery Why: Your appointment for suture removal is at 11 AM. Contact information: [place], [place]        Jonathan Hampshire, [name]. Go on 07/21/2022.   Specialty: Cardiology Why: Your appointment is at 10:40 AM. Contact information: [place] [address] [id number]         Jonathan [name]. Go on 08/03/2022.   Specialty: Cardiothoracic Surgery Why: Your appointment is at 1 PM with Jonathan [name].  Please obtain a chest x-ray 1 hour prior  to the appointment at [facility] located at [address] Contact information: [place] [address] [phone]                 The patient has been discharged on:   1.Beta Blocker:  Yes [   ]                              No   [   ]                              If No, reason:  2.Ace Inhibitor/ARB: Yes [   ]                                     No  [    ]                                     If No, reason:  3.Statin:   Yes [   ]                  No  [   ]                  If  No, reason:  4.Ecasa:  Yes  [   ]                  No   [   ]                  If No, reason:  5. ACS on Admission?  P2Y12 Inhibitor:  Yes  [   ]                                No  [  ]    Signed: Verdis Bassette G. Paden Senger 07/08/2022, 12:19 PM

## 2022-07-09 ENCOUNTER — Inpatient Hospital Stay (HOSPITAL_COMMUNITY): Payer: 59

## 2022-07-09 LAB — BASIC METABOLIC PANEL
Anion gap: 8 (ref 5–15)
Anion gap: 5 (ref 5–15)
BUN: 24 mg/dL — ABNORMAL HIGH (ref 8–23)
BUN: 25 mg/dL — ABNORMAL HIGH (ref 8–23)
CO2: 24 mmol/L (ref 22–32)
CO2: 24 mmol/L (ref 22–32)
Calcium: 7.9 mg/dL — ABNORMAL LOW (ref 8.9–10.3)
Calcium: 7.6 mg/dL — ABNORMAL LOW (ref 8.9–10.3)
Chloride: 99 mmol/L (ref 98–111)
Chloride: 99 mmol/L (ref 98–111)
Creatinine, Ser: 1.67 mg/dL — ABNORMAL HIGH (ref 0.61–1.24)
Creatinine, Ser: 1.79 mg/dL — ABNORMAL HIGH (ref 0.61–1.24)
GFR, Estimated: 43 mL/min — ABNORMAL LOW (ref >60)
GFR, Estimated: 39 mL/min — ABNORMAL LOW (ref >60)
Glucose, Bld: 121 mg/dL — ABNORMAL HIGH (ref 70–99)
Glucose, Bld: 146 mg/dL — ABNORMAL HIGH (ref 70–99)
Potassium: 3.4 mmol/L — ABNORMAL LOW (ref 3.5–5.1)
Potassium: 3.8 mmol/L (ref 3.5–5.1)
Sodium: 131 mmol/L — ABNORMAL LOW (ref 135–145)
Sodium: 128 mmol/L — ABNORMAL LOW (ref 135–145)

## 2022-07-09 LAB — GLUCOSE, CAPILLARY
Glucose-Capillary: 115 mg/dL — ABNORMAL HIGH (ref 70–99)
Glucose-Capillary: 124 mg/dL — ABNORMAL HIGH (ref 70–99)
Glucose-Capillary: 117 mg/dL — ABNORMAL HIGH (ref 70–99)
Glucose-Capillary: 127 mg/dL — ABNORMAL HIGH (ref 70–99)
Glucose-Capillary: 108 mg/dL — ABNORMAL HIGH (ref 70–99)

## 2022-07-09 LAB — CBC
HCT: 26.1 % — ABNORMAL LOW (ref 39.0–52.0)
Hemoglobin: 8.6 g/dL — ABNORMAL LOW (ref 13.0–17.0)
MCH: 30.4 pg (ref 26.0–34.0)
MCHC: 33 g/dL (ref 30.0–36.0)
MCV: 92.2 fL (ref 80.0–100.0)
Platelets: 125 10*3/uL — ABNORMAL LOW (ref 150–400)
RBC: 2.83 MIL/uL — ABNORMAL LOW (ref 4.22–5.81)
RDW: 15.9 % — ABNORMAL HIGH (ref 11.5–15.5)
WBC: 9.7 10*3/uL (ref 4.0–10.5)
nRBC: 0 % (ref 0.0–0.2)

## 2022-07-09 MED ORDER — HYDRALAZINE HCL 20 MG/ML IJ SOLN: 5.0000 mg | Freq: Three times a day (TID) | INTRAMUSCULAR | Status: DC | PRN

## 2022-07-09 MED ORDER — FINASTERIDE 5 MG PO TABS
5.0000 mg | ORAL_TABLET | Freq: Every day | ORAL | Status: DC
  Administered 2022-07-09 – 2022-07-12 (×4): 5 mg via ORAL
  Filled 2022-07-09 (×4): qty 1

## 2022-07-09 MED ORDER — SODIUM CHLORIDE 0.9% FLUSH: 3.0000 mL | INTRAVENOUS | Status: DC | PRN

## 2022-07-09 MED ORDER — GABAPENTIN 300 MG PO CAPS
300.0000 mg | ORAL_CAPSULE | Freq: Every day | ORAL | Status: DC
  Administered 2022-07-09 – 2022-07-11 (×3): 300 mg via ORAL
  Filled 2022-07-09 (×3): qty 1

## 2022-07-09 MED ORDER — ALUM & MAG HYDROXIDE-SIMETH 200-200-20 MG/5ML PO SUSP: 15.0000 mL | Freq: Four times a day (QID) | ORAL | Status: DC | PRN

## 2022-07-09 MED ORDER — ~~LOC~~ CARDIAC SURGERY, PATIENT & FAMILY EDUCATION: Freq: Once | Status: AC

## 2022-07-09 MED ORDER — SODIUM CHLORIDE 0.9 % IV SOLN: 250.0000 mL | INTRAVENOUS | Status: DC | PRN

## 2022-07-09 MED ORDER — SODIUM CHLORIDE 0.9% FLUSH
3.0000 mL | Freq: Two times a day (BID) | INTRAVENOUS | Status: DC
  Administered 2022-07-09 – 2022-07-12 (×6): 3 mL via INTRAVENOUS

## 2022-07-09 MED ORDER — POTASSIUM CHLORIDE CRYS ER 20 MEQ PO TBCR
20.0000 meq | EXTENDED_RELEASE_TABLET | ORAL | Status: AC
  Administered 2022-07-09 (×3): 20 meq via ORAL
  Filled 2022-07-09 (×2): qty 1

## 2022-07-09 MED ORDER — MAGNESIUM HYDROXIDE 400 MG/5ML PO SUSP: 30.0000 mL | Freq: Every day | ORAL | Status: DC | PRN

## 2022-07-09 MED ORDER — GLIPIZIDE 5 MG PO TABS
5.0000 mg | ORAL_TABLET | Freq: Two times a day (BID) | ORAL | Status: DC
  Administered 2022-07-09: 5 mg via ORAL
  Filled 2022-07-09 (×5): qty 1

## 2022-07-09 MED ORDER — INSULIN ASPART 100 UNIT/ML IJ SOLN
0.0000 [IU] | Freq: Three times a day (TID) | INTRAMUSCULAR | Status: DC
  Administered 2022-07-09 (×2): 2 [IU] via SUBCUTANEOUS

## 2022-07-09 MED ORDER — INSULIN ASPART 100 UNIT/ML IJ SOLN: 0.0000 [IU] | Freq: Every day | INTRAMUSCULAR | Status: DC

## 2022-07-09 NOTE — TOC CM/SW Note (Cosign Needed)
[place] for 3n1 bedside commode for home. [place], Heart Failure TOC CM [phone]

## 2022-07-09 NOTE — Progress Notes (Signed)
2 Days Post-Op Procedure(s) (LRB): CORONARY ARTERY BYPASS GRAFTING (CABG) X5 USING OPEN LEFT INTERNAL MAMMARY ARTERY AND ENDOSCOPICALLY HARVESTED RIGHT GREATER SAPHENOUS VEIN (N/A) TRANSESOPHAGEAL ECHOCARDIOGRAM (N/A) Subjective: No complaints this AM  Objective: Vital signs in last 24 hours: Temp:  [97.3 F (36.3 C)-98.4 F (36.9 C)] 97.7 F (36.5 C) (03/09 0848) Pulse Rate:  [60-86] 69 (03/09 0800) Cardiac Rhythm: Heart block (03/09 0800) Resp:  [17-36] 26 (03/09 0800) BP: (97-176)/(53-133) 99/61 (03/09 0800) SpO2:  [92 %-100 %] 99 % (03/09 0800) Arterial Line BP: (79-116)/(76-111) 116/111 (03/08 0945) Weight:  [99.8 kg] 99.8 kg (03/09 0500)  Hemodynamic parameters for last 24 hours: PAP: (30-50)/(9-23) 38/9 CVP:  [7 mmHg-17 mmHg] 7 mmHg  Intake/Output from previous day: 03/08 0701 - 03/09 0700 In: 1650.1 [P.O.:1080; I.V.:370; IV Piggyback:200.1] Out: 900 [Urine:750; Chest Tube:150] Intake/Output this shift: Total I/O In: 100.2 [I.V.:0.2; IV Piggyback:100] Out: -   General appearance: alert, cooperative, and no distress Neurologic: intact Heart: regular rate and rhythm Lungs: diminished breath sounds bibasilar Abdomen: normal findings: soft, non-tender  Lab Results: Recent Labs    07/08/22 1810 07/09/22 0446  WBC 12.7* 9.7  HGB 9.1* 8.6*  HCT 28.8* 26.1*  PLT 141* 125*   BMET:  Recent Labs    07/08/22 1810 07/09/22 0446  NA 133* 131*  K 4.1 3.4*  CL 101 99  CO2 21* 24  GLUCOSE 227* 121*  BUN 22 24*  CREATININE 1.80* 1.67*  CALCIUM 7.9* 7.9*    PT/INR:  Recent Labs    07/07/22 1425  LABPROT 16.8*  INR 1.4*   ABG    Component Value Date/Time   PHART 7.405 07/08/2022 0016   HCO3 21.6 07/08/2022 0016   TCO2 23 07/08/2022 0016   ACIDBASEDEF 3.0 (H) 07/08/2022 0016   O2SAT 96 07/08/2022 0016   CBG (last 3)  Recent Labs    07/08/22 2327 07/09/22 0450 07/09/22 0843  GLUCAP 185* 115* 124*    Assessment/Plan: S/P Procedure(s)  (LRB): CORONARY ARTERY BYPASS GRAFTING (CABG) X5 USING OPEN LEFT INTERNAL MAMMARY ARTERY AND ENDOSCOPICALLY HARVESTED RIGHT GREATER SAPHENOUS VEIN (N/A) TRANSESOPHAGEAL ECHOCARDIOGRAM (N/A) POD # 2 Overall doing well NEURO- intact CV- in SR  Hypertensive last night, hydralazine PRN  ASA, beta blocker, statin RESP_ IS for atelectasis RENAL- creatinine improved, near baseline  Keep Foley in today ENDO- CBG mildly elevated  Change SSI to [facility] and HS  Resume glipizide GI - tolerating diet Anemia sec to ABL- mild, monitor Dc central line Transfer to 4E when bed available   LOS: 2 days    Jonathan [name] 07/09/2022

## 2022-07-09 NOTE — TOC CM/SW Note (Cosign Needed)
DME Medical Necessity  Patient will need bedside commode for home, bedside commode designed to elevate toilet will assist patient from getting up and down off commode. Patient had CABG x5 surgery that requires him to reduce straining.

## 2022-07-09 NOTE — Progress Notes (Signed)
      [address]       [place] [address]             [phone]      POD # 2  Eating dinner, no complaints  BP 112/64   Pulse 75   Temp 98.2 F (36.8 C) (Oral)   Resp (!) 21   Ht 5\' 9"  (1.753 m)   Wt 99.8 kg   SpO2 95%   BMI 32.49 kg/m   BP 112/64   Pulse 75   Temp 98.2 F (36.8 C) (Oral)   Resp (!) 21   Ht 5\' 9"  (1.753 m)   Wt 99.8 kg   SpO2 95%   BMI 32.49 kg/m   Awaiting bed on 4E  Ladye Macnaughton C. Roxan Hockey, MD [facility] [phone]

## 2022-07-09 NOTE — TOC Initial Note (Addendum)
Transition of Care [facility]) - Initial/Assessment Note    Patient Details  Name: Jonathan [name] MRN: [id number] Date of Birth: Nov 18, 1945  [facility] of [facility]) CM/SW Contact:    Erenest Rasher, RN Phone Number: [phone] 07/09/2022, 9:23 AM  Clinical Narrative:                 CM spoke to pt at bedside. States he has RW at home. Requesting a 3n1 bedside commode. Will continue to follow for dc needs.   Expected Discharge Plan: Home/Self Care Barriers to Discharge: Continued Medical Work up   Patient Goals and CMS Choice Patient states their goals for this hospitalization and ongoing recovery are:: wants to recover          Expected Discharge Plan and Services   Discharge Planning Services: CM Consult                     DME Arranged: 3-N-1 DME Agency: [employer] Date DME Agency Contacted: 07/09/22 Time DME Agency Contacted: [phone] Representative spoke with at DME Agency: [place] Arrangements/Services     Patient language and need for interpreter reviewed:: Yes Do you feel safe going back to the place where you live?: Yes      Need for Family Participation in Patient Care: No (Comment) Care giver support system in place?: Yes (comment) Current home services: DME (rolling walker) Criminal Activity/Legal Involvement Pertinent to Current Situation/Hospitalization: No - Comment as needed  Activities of Daily Living      Permission Sought/Granted Permission sought to share information with : Case Manager, PCP, Family Supports Permission granted to share information with : Yes, Verbal Permission Granted  Share Information with NAME: Victory Feickert     Permission granted to share info w Relationship: wife  Permission granted to share info w Contact Information: [phone]  Emotional Assessment Appearance:: Appears stated age Attitude/Demeanor/Rapport: Engaged Affect (typically observed): Accepting Orientation: : Oriented to  Self, Oriented to Place, Oriented to  Time, Oriented to Situation   Psych Involvement: No (comment)  Admission diagnosis:  S/P CABG x 5 [Z95.1] Patient Active Problem List   Diagnosis Date Noted   S/P CABG x 5 07/07/2022   [place], initial encounter [id number]   Chronic systolic heart failure ([place]) 06/24/2022   Coronary artery disease 06/24/2022   Alternating constipation and diarrhea 03/16/2022   Hip arthritis 03/09/2022   Encounter for examination following treatment at hospital 03/09/2022   Iron deficiency anemia 01/13/2022   Other fatigue 01/13/2022   PAD (peripheral artery disease) ([place]) 11/26/2021   Encounter for general adult medical examination with abnormal findings 11/26/2021   Eczema 11/26/2021   Onychomycosis 11/26/2021   Diabetic polyneuropathy associated with type 2 diabetes mellitus ([place]) 10/04/2021   Acute pain of right shoulder 09/17/2021   Subclinical hypothyroidism 02/24/2021   Current chronic use of systemic steroids 02/24/2021   Microhematuria 02/03/2021   Internal hemorrhoids 12/22/2020   UTI (urinary tract infection) 10/26/2020   Type II diabetes mellitus with nephropathy (HCC)    Chronic kidney disease, stage III (moderate) ([place]) 07/16/2007   MONOCYTOSIS SYMPTOMATIC 04/23/2007   BPH (benign prostatic hyperplasia) 04/16/2007   Constipation 10/16/2006   Hyperlipidemia LDL goal <100 04/06/2006   Essential hypertension 04/06/2006   CARDIAC ARRHYTHMIA 04/06/2006   Allergic rhinitis 04/06/2006   GERD (gastroesophageal reflux disease) 04/06/2006   HIATAL HERNIA 04/06/2006   Rheumatoid arthritis ([place]) 04/06/2006   LOW  BACK PAIN 04/06/2006   PCP:  [name], MD Pharmacy:   [facility] [address] [place] [address] Phone: [phone] Fax: [place] [place] [address] Phone: [phone] Fax:  [phone]     Social Determinants of Health (SDOH) Social History: SDOH Screenings   Food Insecurity: No Food Insecurity (11/15/2021)  Housing: Low Risk  (11/15/2021)  Transportation Needs: No Transportation Needs (11/15/2021)  Alcohol Screen: Low Risk  (11/15/2021)  Depression (PHQ2-9): Low Risk  (06/28/2022)  Financial Resource Strain: Low Risk  (11/15/2021)  Physical Activity: Sufficiently Active (11/15/2021)  Social Connections: Moderately Integrated (11/15/2021)  Stress: No Stress Concern Present (11/15/2021)  Tobacco Use: Low Risk  (07/08/2022)   SDOH Interventions:     Readmission Risk Interventions     No data to display

## 2022-07-10 ENCOUNTER — Inpatient Hospital Stay (HOSPITAL_COMMUNITY): Payer: 59

## 2022-07-10 LAB — BASIC METABOLIC PANEL
Anion gap: 10 (ref 5–15)
BUN: 27 mg/dL — ABNORMAL HIGH (ref 8–23)
CO2: 21 mmol/L — ABNORMAL LOW (ref 22–32)
Calcium: 7.9 mg/dL — ABNORMAL LOW (ref 8.9–10.3)
Chloride: 98 mmol/L (ref 98–111)
Creatinine, Ser: 1.74 mg/dL — ABNORMAL HIGH (ref 0.61–1.24)
GFR, Estimated: 41 mL/min — ABNORMAL LOW (ref >60)
Glucose, Bld: 69 mg/dL — ABNORMAL LOW (ref 70–99)
Potassium: 4.2 mmol/L (ref 3.5–5.1)
Sodium: 129 mmol/L — ABNORMAL LOW (ref 135–145)

## 2022-07-10 LAB — CBC
HCT: 26.6 % — ABNORMAL LOW (ref 39.0–52.0)
Hemoglobin: 8.3 g/dL — ABNORMAL LOW (ref 13.0–17.0)
MCH: 29.1 pg (ref 26.0–34.0)
MCHC: 31.2 g/dL (ref 30.0–36.0)
MCV: 93.3 fL (ref 80.0–100.0)
Platelets: 132 10*3/uL — ABNORMAL LOW (ref 150–400)
RBC: 2.85 MIL/uL — ABNORMAL LOW (ref 4.22–5.81)
RDW: 15.8 % — ABNORMAL HIGH (ref 11.5–15.5)
WBC: 11 10*3/uL — ABNORMAL HIGH (ref 4.0–10.5)
nRBC: 0 % (ref 0.0–0.2)

## 2022-07-10 LAB — GLUCOSE, CAPILLARY
Glucose-Capillary: 62 mg/dL — ABNORMAL LOW (ref 70–99)
Glucose-Capillary: 95 mg/dL (ref 70–99)
Glucose-Capillary: 65 mg/dL — ABNORMAL LOW (ref 70–99)
Glucose-Capillary: 115 mg/dL — ABNORMAL HIGH (ref 70–99)

## 2022-07-10 MED ORDER — FUROSEMIDE 40 MG PO TABS
40.0000 mg | ORAL_TABLET | Freq: Every day | ORAL | Status: DC
  Administered 2022-07-10: 40 mg via ORAL
  Filled 2022-07-10: qty 1

## 2022-07-10 MED ORDER — INSULIN ASPART 100 UNIT/ML IJ SOLN
0.0000 [IU] | Freq: Three times a day (TID) | INTRAMUSCULAR | Status: DC
  Administered 2022-07-11: 1 [IU] via SUBCUTANEOUS

## 2022-07-10 NOTE — Progress Notes (Signed)
3 Days Post-Op Procedure(s) (LRB): CORONARY ARTERY BYPASS GRAFTING (CABG) X5 USING OPEN LEFT INTERNAL MAMMARY ARTERY AND ENDOSCOPICALLY HARVESTED RIGHT GREATER SAPHENOUS VEIN (N/A) TRANSESOPHAGEAL ECHOCARDIOGRAM (N/A) Subjective: No complaints currently, was shaky earlier with hypoglycemia  Objective: Vital signs in last 24 hours: Temp:  [97.8 F (36.6 C)-98.4 F (36.9 C)] 97.8 F (36.6 C) (03/10 0400) Pulse Rate:  [64-85] 74 (03/10 0700) Cardiac Rhythm: Normal sinus rhythm (03/10 0800) Resp:  [18-34] 26 (03/10 0800) BP: (95-121)/(57-79) 106/67 (03/10 0400) SpO2:  [88 %-100 %] 97 % (03/10 0700) Weight:  [101.3 kg] 101.3 kg (03/10 0500)  Hemodynamic parameters for last 24 hours:    Intake/Output from previous day: 03/09 0701 - 03/10 0700 In: 100.2 [I.V.:0.2; IV Piggyback:100] Out: 533 [Urine:533] Intake/Output this shift: Total I/O In: -  Out: 20 [Urine:20]  General appearance: alert, cooperative, and no distress Neurologic: intact Heart: regular rate and rhythm Lungs: crackles at bases Abdomen: normal findings: soft, non-tender Wound: clean and dry  Lab Results: Recent Labs    07/09/22 0446 07/10/22 0301  WBC 9.7 11.0*  HGB 8.6* 8.3*  HCT 26.1* 26.6*  PLT 125* 132*   BMET:  Recent Labs    07/09/22 1531 07/10/22 0301  NA 128* 129*  K 3.8 4.2  CL 99 98  CO2 24 21*  GLUCOSE 146* 69*  BUN 25* 27*  CREATININE 1.79* 1.74*  CALCIUM 7.6* 7.9*    PT/INR:  Recent Labs    07/07/22 1425  LABPROT 16.8*  INR 1.4*   ABG    Component Value Date/Time   PHART 7.405 07/08/2022 0016   HCO3 21.6 07/08/2022 0016   TCO2 23 07/08/2022 0016   ACIDBASEDEF 3.0 (H) 07/08/2022 0016   O2SAT 96 07/08/2022 0016   CBG (last 3)  Recent Labs    07/09/22 1657 07/09/22 2204 07/10/22 0641  GLUCAP 127* 108* 62*    Assessment/Plan: S/P Procedure(s) (LRB): CORONARY ARTERY BYPASS GRAFTING (CABG) X5 USING OPEN LEFT INTERNAL MAMMARY ARTERY AND ENDOSCOPICALLY HARVESTED  RIGHT GREATER SAPHENOUS VEIN (N/A) TRANSESOPHAGEAL ECHOCARDIOGRAM (N/A) POD # 3 NEURO- intact CV- in SR  ASA, beta blocker, statin RESP- continue IS  Some crackles in bases- diurese RENAL- creatinine stable at 1.74  Mild hyponatremia- monitor  Fluid overload- PO Lasix ENDO_ hypoglycemia this AM  Hold glipizide  Adjust SSI- sensitive scale with no HS coverage GI- tolerating diet SCD + enoxaparin Cardiac rehab Awaiting bed on 4E   LOS: 3 days    Melrose Nakayama 07/10/2022

## 2022-07-10 NOTE — Progress Notes (Signed)
Hypoglycemic event noted by previous shift with CBG of 62 at 0640. Patient reported shakiness. CBG treated by previous shift. Repeat CBG at 0730 121.

## 2022-07-11 LAB — BASIC METABOLIC PANEL
Anion gap: 9 (ref 5–15)
BUN: 33 mg/dL — ABNORMAL HIGH (ref 8–23)
CO2: 21 mmol/L — ABNORMAL LOW (ref 22–32)
Calcium: 7.9 mg/dL — ABNORMAL LOW (ref 8.9–10.3)
Chloride: 99 mmol/L (ref 98–111)
Creatinine, Ser: 2.15 mg/dL — ABNORMAL HIGH (ref 0.61–1.24)
GFR, Estimated: 32 mL/min — ABNORMAL LOW (ref >60)
Glucose, Bld: 88 mg/dL (ref 70–99)
Potassium: 4.4 mmol/L (ref 3.5–5.1)
Sodium: 129 mmol/L — ABNORMAL LOW (ref 135–145)

## 2022-07-11 LAB — GLUCOSE, CAPILLARY
Glucose-Capillary: 121 mg/dL — ABNORMAL HIGH (ref 70–99)
Glucose-Capillary: 87 mg/dL (ref 70–99)
Glucose-Capillary: 105 mg/dL — ABNORMAL HIGH (ref 70–99)
Glucose-Capillary: 142 mg/dL — ABNORMAL HIGH (ref 70–99)

## 2022-07-11 MED ORDER — FUROSEMIDE 40 MG PO TABS: 40.0000 mg | ORAL_TABLET | Freq: Every day | ORAL | Status: DC

## 2022-07-11 MED FILL — Potassium Chloride Inj 2 mEq/ML: INTRAVENOUS | Qty: 40 | Status: AC

## 2022-07-11 MED FILL — Magnesium Sulfate Inj 50%: INTRAMUSCULAR | Qty: 10 | Status: AC

## 2022-07-11 MED FILL — Heparin Sodium (Porcine) Inj 1000 Unit/ML: Qty: 1000 | Status: AC

## 2022-07-11 NOTE — Progress Notes (Signed)
Mobility Specialist Progress Note:   07/11/22 1444  Mobility  Activity Ambulated with assistance in hallway  Level of Assistance Standby assist, set-up cues, supervision of patient - no hands on  Assistive Device Front wheel walker  Distance Ambulated (ft) 240 ft  Activity Response Tolerated well  $Mobility charge 1 Mobility   Pt in bed willing to participate in mobility. No complaints of pain. Left in bed with call bell in reach and all needs met.   Gareth Eagle Seena Face Mobility Specialist Please contact via [employer] or  Rehab Office at [phone]

## 2022-07-11 NOTE — Care Management Important Message (Signed)
Important Message  Patient Details  Name: Jonathan [name] MRN: [id number] Date of Birth: 1945-10-16   Medicare Important Message Given:  Yes     Shelda Altes 07/11/2022, 9:19 AM

## 2022-07-11 NOTE — Progress Notes (Addendum)
      [address]       [place] [address]             [phone]        4 Days Post-Op Procedure(s) (LRB): CORONARY ARTERY BYPASS GRAFTING (CABG) X5 USING OPEN LEFT INTERNAL MAMMARY ARTERY AND ENDOSCOPICALLY HARVESTED RIGHT GREATER SAPHENOUS VEIN (N/A) TRANSESOPHAGEAL ECHOCARDIOGRAM (N/A)  Subjective: Patient just waking up this am. He has no specific complaint  Objective: Vital signs in last 24 hours: Temp:  [97.1 F (36.2 C)-99.2 F (37.3 C)] 99.2 F (37.3 C) (03/11 0718) Pulse Rate:  [69-78] 78 (03/11 0718) Cardiac Rhythm: Normal sinus rhythm (03/10 1953) Resp:  [16-26] 19 (03/11 0718) BP: (108-128)/(61-75) 117/63 (03/11 0718) SpO2:  [98 %-100 %] 98 % (03/11 0718) Weight:  [100.5 kg] 100.5 kg (03/11 0500)  Pre op weight 100.2 kg Current Weight  07/11/22 100.5 kg      Intake/Output from previous day: 03/10 0701 - 03/11 0700 In: -  Out: 1288 [Urine:1288]   Physical Exam:  Cardiovascular: RRR Pulmonary: Slightly diminished bibasilar breath sounds. Abdomen: Soft, non tender, bowel sounds present. Extremities: Trace bilateral lower extremity edema. Wounds: Clean and dry.  No erythema or signs of infection.  Lab Results: CBC: Recent Labs    07/09/22 0446 07/10/22 0301  WBC 9.7 11.0*  HGB 8.6* 8.3*  HCT 26.1* 26.6*  PLT 125* 132*   BMET:  Recent Labs    07/10/22 0301 07/11/22 0050  NA 129* 129*  K 4.2 4.4  CL 98 99  CO2 21* 21*  GLUCOSE 69* 88  BUN 27* 33*  CREATININE 1.74* 2.15*  CALCIUM 7.9* 7.9*    PT/INR:  Lab Results  Component Value Date   INR 1.4 (H) 07/07/2022   INR 1.1 07/05/2022   ABG:  INR: Will add last result for INR, ABG once components are confirmed Will add last 4 CBG results once components are confirmed  Assessment/Plan:  1. CV - SR with HR in the 70's. On Lopressor 50 mg bid 2.  Pulmonary - On room air. Encourage incentive spirometer. 3. Volume Overload - On Lasix 40 mg daily 4.  Expected  post op acute blood loss anemia - Last H and H stable at 8.3 and 26.6 5. DM-CBGs 65/115/87. Pre op HGA1C 6.1. On Insulin PRN. Will restart Glipizide at discharge as has had hypoglycemia when it was restarted 6. On Lovenox for DVT prophylaxis 7. Mild thrombocytopenia-last platelets 132,000 8. History of CKD (stage IIIB as below)-creatinine this am increased from 1.74 to 2.15. Hold Lasix today. Appears baseline around 1.5 Chronic Kidney Disease   Stage I     GFR >90  Stage II    GFR 60-89  Stage IIIA GFR 45-59  Stage IIIB GFR 30-44  Stage IV   GFR 15-29  Stage V    GFR  <15  Lab Results  Component Value Date   CREATININE 2.15 (H) 07/11/2022   Estimated Creatinine Clearance: 35.2 mL/min (A) (by C-G formula based on SCr of 2.15 mg/dL (H)).  9. Remove EPW 10. Home once creatinine stable   Donielle M ZimmermanPA-C 7:31 AM   Chart reviewed, patient examined, agree with above. He looks good overall but creat bumped to 2.15 most likely related to diuresis. Wt is at preop so will stop lasix and repeat BMET in am. Home when renal function stable.

## 2022-07-11 NOTE — Progress Notes (Signed)
CARDIAC REHAB PHASE I   PRE:  Rate/Rhythm: 86 SR    BP: sitting 116/71    SpO2: would not register   MODE:  Ambulation: 210 ft   POST:  Rate/Rhythm: 97 SR    BP: sitting 152/71     SpO2: 100 RA on forehead after 5 min  Pt on EOB. SpO2 would not register with good pleth on ear, fingers, forehead.  Mod assist with gait belt and rocking to stand. Walked slowly with RW, contact guard. No major c/o. Fatigue with distance. To recliner.  Finally got SpO2 on forehead after 5 min rest. Pt practiced IS, [phone] ml. Encouraged x2 more walks and IS.  [phone]  Yves Dill BS, ACSM-CEP 07/11/2022 10:30 AM

## 2022-07-12 ENCOUNTER — Other Ambulatory Visit: Payer: Self-pay | Admitting: Surgery

## 2022-07-12 ENCOUNTER — Other Ambulatory Visit (HOSPITAL_COMMUNITY): Payer: Self-pay

## 2022-07-12 DIAGNOSIS — I2510 Atherosclerotic heart disease of native coronary artery without angina pectoris: Secondary | ICD-10-CM

## 2022-07-12 LAB — BASIC METABOLIC PANEL
Anion gap: 6 (ref 5–15)
BUN: 30 mg/dL — ABNORMAL HIGH (ref 8–23)
CO2: 22 mmol/L (ref 22–32)
Calcium: 7.8 mg/dL — ABNORMAL LOW (ref 8.9–10.3)
Chloride: 105 mmol/L (ref 98–111)
Creatinine, Ser: 1.84 mg/dL — ABNORMAL HIGH (ref 0.61–1.24)
GFR, Estimated: 38 mL/min — ABNORMAL LOW (ref >60)
Glucose, Bld: 161 mg/dL — ABNORMAL HIGH (ref 70–99)
Potassium: 4.5 mmol/L (ref 3.5–5.1)
Sodium: 133 mmol/L — ABNORMAL LOW (ref 135–145)

## 2022-07-12 LAB — GLUCOSE, CAPILLARY
Glucose-Capillary: 116 mg/dL — ABNORMAL HIGH (ref 70–99)
Glucose-Capillary: 101 mg/dL — ABNORMAL HIGH (ref 70–99)

## 2022-07-12 MED ORDER — TRAMADOL HCL 50 MG PO TABS
50.0000 mg | ORAL_TABLET | Freq: Four times a day (QID) | ORAL | 0 refills | Status: AC | PRN
  Filled 2022-07-12: qty 28, 7d supply, fill #0

## 2022-07-12 MED ORDER — ACETAMINOPHEN 325 MG PO TABS: 650.0000 mg | ORAL_TABLET | ORAL | Status: AC | PRN

## 2022-07-12 MED ORDER — METHOTREXATE SODIUM 2.5 MG PO TABS
12.5000 mg | ORAL_TABLET | ORAL | 0 refills | Status: AC
  Filled 2022-07-12: qty 5, 7d supply, fill #0

## 2022-07-12 NOTE — Progress Notes (Signed)
5 Days Post-Op Procedure(s) (LRB): CORONARY ARTERY BYPASS GRAFTING (CABG) X5 USING OPEN LEFT INTERNAL MAMMARY ARTERY AND ENDOSCOPICALLY HARVESTED RIGHT GREATER SAPHENOUS VEIN (N/A) TRANSESOPHAGEAL ECHOCARDIOGRAM (N/A) Subjective: No complaints. Ambulating well, bowels working. Feels fine off oxygen.  Objective: Vital signs in last 24 hours: Temp:  [98.3 F (36.8 C)-99.2 F (37.3 C)] 98.7 F (37.1 C) (03/12 0615) Pulse Rate:  [72-78] 73 (03/12 0615) Cardiac Rhythm: Normal sinus rhythm (03/11 1950) Resp:  [18-19] 18 (03/12 0615) BP: (117-164)/(61-86) 164/86 (03/12 0615) SpO2:  [90 %-98 %] 90 % (03/12 0038) Weight:  [99.7 kg] 99.7 kg (03/12 0615)  Hemodynamic parameters for last 24 hours:    Intake/Output from previous day: 03/11 0701 - 03/12 0700 In: -  Out: 1300 [Urine:1300] Intake/Output this shift: No intake/output data recorded.  General appearance: alert and cooperative Neurologic: intact Heart: regular rate and rhythm, S1, S2 normal, no murmur Lungs: clear to auscultation bilaterally Extremities: no edema Wound: incisions ok  Lab Results: Recent Labs    07/10/22 0301  WBC 11.0*  HGB 8.3*  HCT 26.6*  PLT 132*   BMET:  Recent Labs    07/11/22 0050 07/12/22 0114  NA 129* 133*  K 4.4 4.5  CL 99 105  CO2 21* 22  GLUCOSE 88 161*  BUN 33* 30*  CREATININE 2.15* 1.84*  CALCIUM 7.9* 7.8*    PT/INR: No results for input(s): "LABPROT", "INR" in the last 72 hours. ABG    Component Value Date/Time   PHART 7.405 07/08/2022 0016   HCO3 21.6 07/08/2022 0016   TCO2 23 07/08/2022 0016   ACIDBASEDEF 3.0 (H) 07/08/2022 0016   O2SAT 96 07/08/2022 0016   CBG (last 3)  Recent Labs    07/11/22 1749 07/11/22 2112 07/12/22 0628  GLUCAP 142* 116* 101*    Assessment/Plan: S/P Procedure(s) (LRB): CORONARY ARTERY BYPASS GRAFTING (CABG) X5 USING OPEN LEFT INTERNAL MAMMARY ARTERY AND ENDOSCOPICALLY HARVESTED RIGHT GREATER SAPHENOUS VEIN (N/A) TRANSESOPHAGEAL  ECHOCARDIOGRAM (N/A)  POD 5  Hemodynamically stable. Continue Lopressor 50 bid. Would hold off on his previous Losartan with elevated creat. Meds can be adjusted as outpt.  Wt is below preop. No diuretic needed.  Creat improved today to 1.84. Baseline 1.5. Should have BMET checked when he comes back to the office.  Glucose under good control.  I think he can go home today with his wife.    LOS: 5 days    Gaye Pollack 07/12/2022

## 2022-07-12 NOTE — Plan of Care (Signed)
Problem: Education: Goal: Understanding of CV disease, CV risk reduction, and recovery process will improve Outcome: Adequate for Discharge Goal: Individualized Educational Video(s) Outcome: Adequate for Discharge   Problem: Activity: Goal: Ability to return to baseline activity level will improve Outcome: Adequate for Discharge   Problem: Cardiovascular: Goal: Ability to achieve and maintain adequate cardiovascular perfusion will improve Outcome: Adequate for Discharge Goal: Vascular access site(s) Level 0-1 will be maintained Outcome: Adequate for Discharge   Problem: Health Behavior/Discharge Planning: Goal: Ability to safely manage health-related needs after discharge will improve Outcome: Adequate for Discharge   Problem: Education: Goal: Knowledge of General Education information will improve Description: Including pain rating scale, medication(s)/side effects and non-pharmacologic comfort measures Outcome: Adequate for Discharge   Problem: Health Behavior/Discharge Planning: Goal: Ability to manage health-related needs will improve Outcome: Adequate for Discharge   Problem: Clinical Measurements: Goal: Ability to maintain clinical measurements within normal limits will improve Outcome: Adequate for Discharge Goal: Will remain free from infection Outcome: Adequate for Discharge Goal: Diagnostic test results will improve Outcome: Adequate for Discharge Goal: Respiratory complications will improve Outcome: Adequate for Discharge Goal: Cardiovascular complication will be avoided Outcome: Adequate for Discharge   Problem: Activity: Goal: Risk for activity intolerance will decrease Outcome: Adequate for Discharge   Problem: Nutrition: Goal: Adequate nutrition will be maintained Outcome: Adequate for Discharge   Problem: Coping: Goal: Level of anxiety will decrease Outcome: Adequate for Discharge   Problem: Elimination: Goal: Will not experience complications  related to bowel motility Outcome: Adequate for Discharge Goal: Will not experience complications related to urinary retention Outcome: Adequate for Discharge   Problem: Pain Managment: Goal: General experience of comfort will improve Outcome: Adequate for Discharge   Problem: Safety: Goal: Ability to remain free from injury will improve Outcome: Adequate for Discharge   Problem: Skin Integrity: Goal: Risk for impaired skin integrity will decrease Outcome: Adequate for Discharge   Problem: Education: Goal: Will demonstrate proper wound care and an understanding of methods to prevent future damage Outcome: Adequate for Discharge Goal: Knowledge of disease or condition will improve Outcome: Adequate for Discharge Goal: Knowledge of the prescribed therapeutic regimen will improve Outcome: Adequate for Discharge Goal: Individualized Educational Video(s) Outcome: Adequate for Discharge   Problem: Activity: Goal: Risk for activity intolerance will decrease Outcome: Adequate for Discharge   Problem: Cardiac: Goal: Will achieve and/or maintain hemodynamic stability Outcome: Adequate for Discharge   Problem: Clinical Measurements: Goal: Postoperative complications will be avoided or minimized Outcome: Adequate for Discharge   Problem: Respiratory: Goal: Respiratory status will improve Outcome: Adequate for Discharge   Problem: Skin Integrity: Goal: Wound healing without signs and symptoms of infection Outcome: Adequate for Discharge Goal: Risk for impaired skin integrity will decrease Outcome: Adequate for Discharge   Problem: Urinary Elimination: Goal: Ability to achieve and maintain adequate renal perfusion and functioning will improve Outcome: Adequate for Discharge   Problem: Education: Goal: Ability to describe self-care measures that may prevent or decrease complications (Diabetes Survival Skills Education) will improve Outcome: Adequate for Discharge Goal:  Individualized Educational Video(s) Outcome: Adequate for Discharge   Problem: Coping: Goal: Ability to adjust to condition or change in health will improve Outcome: Adequate for Discharge   Problem: Fluid Volume: Goal: Ability to maintain a balanced intake and output will improve Outcome: Adequate for Discharge   Problem: Health Behavior/Discharge Planning: Goal: Ability to identify and utilize available resources and services will improve Outcome: Adequate for Discharge Goal: Ability to manage health-related needs will improve Outcome:   Adequate for Discharge   Problem: Metabolic: Goal: Ability to maintain appropriate glucose levels will improve Outcome: Adequate for Discharge   Problem: Nutritional: Goal: Maintenance of adequate nutrition will improve Outcome: Adequate for Discharge Goal: Progress toward achieving an optimal weight will improve Outcome: Adequate for Discharge   Problem: Skin Integrity: Goal: Risk for impaired skin integrity will decrease Outcome: Adequate for Discharge   Problem: Tissue Perfusion: Goal: Adequacy of tissue perfusion will improve Outcome: Adequate for Discharge   

## 2022-07-12 NOTE — Progress Notes (Signed)
Pt helped to BR, will educate after.   Christen Bame 07/12/2022 9:28 AM

## 2022-07-12 NOTE — Progress Notes (Signed)
Pt received [facility] book and education on restrictions, heart healthy diet, ex guidelines, Move in the Tube sheet, incentive spirometer use when d/c and CRPII. Pt denies questions and was encouraged to look in the book for additional information. Referral placed to AP.    Christen Bame 07/12/2022 10:22 AM

## 2022-07-12 NOTE — TOC Transition Note (Signed)
Transition of Care (TOC) - CM/SW Discharge Note Marvetta Gibbons RN, BSN Transitions of Care Unit 4E- RN Case Manager See Treatment Team for direct phone #   Patient Details  Name: Jonathan [name] MRN: [id number] Date of Birth: [birth year]  Transition of Care [facility]) CM/SW Contact:  Dawayne Patricia, RN Phone Number: 07/12/2022, 9:31 AM   Clinical Narrative:    Pt stable for transition home today, wife to come transport home.  Orders placed for DME- RW and BSC.  Per bedside RN pt has stated that he only wants RW for home and does not want BSC.   Call made to [place] for DME referral- request RW for home- DME to be delivered to room prior to discharge- pt declined BSC.   No further TOC needs noted.    Final next level of care: Home/Self Care Barriers to Discharge: Barriers Resolved   Patient Goals and CMS Choice CMS Medicare.gov Compare Post Acute Care list provided to:: Patient Choice offered to / list presented to : Patient  Discharge Placement            Home             Discharge Plan and Services Additional resources added to the After Visit Summary for     Discharge Planning Services: CM Consult Post Acute Care Choice: Durable Medical Equipment          DME Arranged: Gilford Rile rolling DME Agency: [employer] Date DME Agency Contacted: 07/09/22 Time DME Agency Contacted: [phone] Representative spoke with at DME Agency: [place]: NA [place] Agency: NA        Social Determinants of Health ([place]) Interventions SDOH Screenings   Food Insecurity: No Food Insecurity (07/11/2022)  Housing: Low Risk  (07/11/2022)  Transportation Needs: No Transportation Needs (07/11/2022)  Utilities: Not At Risk (07/11/2022)  Alcohol Screen: Low Risk  (11/15/2021)  Depression (PHQ2-9): Low Risk  (06/28/2022)  Financial Resource Strain: Low Risk  (11/15/2021)  Physical Activity: Sufficiently Active (11/15/2021)  Social Connections: Moderately Integrated (11/15/2021)   Stress: No Stress Concern Present (11/15/2021)  Tobacco Use: Low Risk  (07/08/2022)     Readmission Risk Interventions    07/12/2022    9:30 AM  Readmission Risk Prevention Plan  Transportation Screening Complete  PCP or Specialist Appt within 3-5 Days Complete  HRI or [place] Complete  Social Work Consult for [place] Planning/Counseling Complete  Palliative Care Screening Not Applicable  Medication Review Press photographer) Complete

## 2022-07-12 NOTE — Progress Notes (Signed)
Discharge instructions (including medications) discussed with and copy provided to patient/caregiver Patients walker was delivered and patient said he did not need a bedside commode.

## 2022-07-13 ENCOUNTER — Telehealth: Payer: Self-pay

## 2022-07-13 NOTE — Transitions of Care (Post Inpatient/ED Visit) (Signed)
   07/13/2022  Name: Jonathan [name] MRN: [id number] DOB: [birth year]  Today's TOC FU Call Status: Today's TOC FU Call Status:: Successful TOC FU Call Competed Unsuccessful Call (1st Attempt) Date: 07/13/22 [facility] FU Call Complete Date: 07/13/22  Transition Care Management Follow-up Telephone Call Date of Discharge: 07/12/22 Discharge Facility: [facility]) Type of Discharge: Inpatient Admission Primary Inpatient Discharge Diagnosis:: aortocoronary bypass How have you been since you were released from the hospital?: Better Any questions or concerns?: No  Items Reviewed: Did you receive and understand the discharge instructions provided?: Yes Medications obtained and verified?: Yes (Medications Reviewed) Any new allergies since your discharge?: No Dietary orders reviewed?: Yes Do you have support at home?: Yes People in Home: spouse  Home Care and Equipment/Supplies: [place] Ordered?: NA Any new equipment or medical supplies ordered?: NA  Functional Questionnaire: Do you need assistance with bathing/showering or dressing?: Yes Do you need assistance with meal preparation?: Yes Do you need assistance with eating?: No Do you have difficulty maintaining continence: No Do you need assistance with getting out of bed/getting out of a chair/moving?: No Do you have difficulty managing or taking your medications?: No  Folllow up appointments reviewed: PCP Follow-up appointment confirmed?: Yes Date of PCP follow-up appointment?: 07/21/22 Follow-up Provider: Dr Posey Pronto [facility] Follow-up appointment confirmed?: Yes Date of Specialist follow-up appointment?: 07/21/22 Follow-Up Specialty Provider:: Dr Fletcher Anon Do you need transportation to your follow-up appointment?: No Do you understand care options if your condition(s) worsen?: Yes-patient verbalized understanding    [place], [place] [phone]

## 2022-07-13 NOTE — Transitions of Care (Post Inpatient/ED Visit) (Signed)
   07/13/2022  Name: Jonathan [name] MRN: [id number] DOB: 1945/05/24  Today's TOC FU Call Status: Today's TOC FU Call Status:: Unsuccessul Call (1st Attempt) Unsuccessful Call (1st Attempt) Date: 07/13/22  Attempted to reach the patient regarding the most recent Inpatient/ED visit.  Follow Up Plan: Additional outreach attempts will be made to reach the patient to complete the Transitions of Care (Post Inpatient/ED visit) call.   Signature Juanda Crumble, [place] Direct Dial [phone]

## 2022-07-14 LAB — TYPE AND SCREEN
ABO/RH(D): B POS
Antibody Screen: NEGATIVE
Unit division: 0
Unit division: 0
Unit division: 0
Unit division: 0

## 2022-07-14 LAB — BPAM RBC
Blood Product Expiration Date: 202404022359
Blood Product Expiration Date: 202404022359
Blood Product Expiration Date: 202404022359
Blood Product Expiration Date: 202404022359
ISSUE DATE / TIME: 202403070833
ISSUE DATE / TIME: 202403070833
Unit Type and Rh: 7300
Unit Type and Rh: 7300
Unit Type and Rh: 7300
Unit Type and Rh: 7300

## 2022-07-14 MED FILL — Albumin, Human Inj 5%: INTRAVENOUS | Qty: 250 | Status: AC

## 2022-07-14 MED FILL — Electrolyte-R (PH 7.4) Solution: INTRAVENOUS | Qty: 5000 | Status: AC

## 2022-07-14 MED FILL — Lidocaine HCl Local Soln Prefilled Syringe 100 MG/5ML (2%): INTRAMUSCULAR | Qty: 5 | Status: AC

## 2022-07-14 MED FILL — Mannitol IV Soln 20%: INTRAVENOUS | Qty: 500 | Status: AC

## 2022-07-14 MED FILL — Sodium Chloride IV Soln 0.9%: INTRAVENOUS | Qty: 2000 | Status: AC

## 2022-07-20 ENCOUNTER — Ambulatory Visit: Payer: 59 | Admitting: *Deleted

## 2022-07-20 DIAGNOSIS — Z4802 Encounter for removal of sutures: Secondary | ICD-10-CM

## 2022-07-20 NOTE — Progress Notes (Signed)
Patient arrived for nurse visit to remove sutures post-CABG 3/7 by Dr. Cyndia Bent. Three sutures removed with no signs or symptoms of infection noted.  All incisions well approximated. Patient tolerated suture removal well.  Patient and family instructed to keep the incision site clean and dry. Patient and family acknowledged instructions given.  All questions answered.

## 2022-07-21 ENCOUNTER — Ambulatory Visit (INDEPENDENT_AMBULATORY_CARE_PROVIDER_SITE_OTHER): Payer: 59 | Admitting: Internal Medicine

## 2022-07-21 ENCOUNTER — Encounter: Payer: Self-pay | Admitting: Cardiovascular Disease

## 2022-07-21 ENCOUNTER — Ambulatory Visit: Payer: 59 | Attending: Cardiovascular Disease | Admitting: Cardiovascular Disease

## 2022-07-21 ENCOUNTER — Encounter: Payer: Self-pay | Admitting: Internal Medicine

## 2022-07-21 VITALS — BP 130/71 | HR 88 | Ht 69.0 in | Wt 221.6 lb

## 2022-07-21 VITALS — BP 104/62 | HR 76 | Ht 69.0 in | Wt 223.0 lb

## 2022-07-21 DIAGNOSIS — I25119 Atherosclerotic heart disease of native coronary artery with unspecified angina pectoris: Secondary | ICD-10-CM

## 2022-07-21 DIAGNOSIS — I739 Peripheral vascular disease, unspecified: Secondary | ICD-10-CM | POA: Diagnosis not present

## 2022-07-21 DIAGNOSIS — M069 Rheumatoid arthritis, unspecified: Secondary | ICD-10-CM

## 2022-07-21 DIAGNOSIS — Z951 Presence of aortocoronary bypass graft: Secondary | ICD-10-CM | POA: Diagnosis not present

## 2022-07-21 DIAGNOSIS — I10 Essential (primary) hypertension: Secondary | ICD-10-CM | POA: Diagnosis not present

## 2022-07-21 DIAGNOSIS — I5022 Chronic systolic (congestive) heart failure: Secondary | ICD-10-CM

## 2022-07-21 DIAGNOSIS — I25118 Atherosclerotic heart disease of native coronary artery with other forms of angina pectoris: Secondary | ICD-10-CM

## 2022-07-21 DIAGNOSIS — E1121 Type 2 diabetes mellitus with diabetic nephropathy: Secondary | ICD-10-CM

## 2022-07-21 DIAGNOSIS — Z09 Encounter for follow-up examination after completed treatment for conditions other than malignant neoplasm: Secondary | ICD-10-CM | POA: Insufficient documentation

## 2022-07-21 DIAGNOSIS — N1831 Chronic kidney disease, stage 3a: Secondary | ICD-10-CM | POA: Diagnosis not present

## 2022-07-21 DIAGNOSIS — E782 Mixed hyperlipidemia: Secondary | ICD-10-CM

## 2022-07-21 DIAGNOSIS — D649 Anemia, unspecified: Secondary | ICD-10-CM | POA: Diagnosis not present

## 2022-07-21 NOTE — Assessment & Plan Note (Signed)
S/p CABG Followed by Cardiology On Aspirin and statin Needs to discuss Plavix or Brilinta with Cardiologist

## 2022-07-21 NOTE — Patient Instructions (Signed)
Medication Instructions:  No changes *If you need a refill on your cardiac medications before your next appointment, please call your pharmacy*   Lab Work: None ordered If you have labs (blood work) drawn today and your tests are completely normal, you will receive your results only by: MyChart Message (if you have MyChart) OR A paper copy in the mail If you have any lab test that is abnormal or we need to change your treatment, we will call you to review the results.   Testing/Procedures: None ordered   Follow-Up: At [facility], you and your health needs are our priority.  As part of our continuing mission to provide you with exceptional heart care, we have created designated Provider Care Teams.  These Care Teams include your primary Cardiologist (physician) and Advanced Practice Providers (APPs -  Physician Assistants and Nurse Practitioners) who all work together to provide you with the care you need, when you need it.  We recommend signing up for the patient portal called "MyChart".  Sign up information is provided on this After Visit Summary.  MyChart is used to connect with patients for Virtual Visits (Telemedicine).  Patients are able to view lab/test results, encounter notes, upcoming appointments, etc.  Non-urgent messages can be sent to your provider as well.   To learn more about what you can do with MyChart, go to [email or web address].    Your next appointment:   2 month(s)  Provider:   You may see Dr. Arida or one of the following Advanced Practice Providers on your designated Care Team:   Christopher Berge, NP Ryan Dunn, PA-C Cadence Furth, PA-C Sheri Hammock, NP     

## 2022-07-21 NOTE — Assessment & Plan Note (Signed)
On aspirin and statin ?Followed by cardiology. ?

## 2022-07-21 NOTE — Assessment & Plan Note (Signed)
Hospital chart reviewed, including discharge summary Medications reconciled and reviewed with the patient in detail 

## 2022-07-21 NOTE — Progress Notes (Signed)
Cardiology Office Note   Date:  07/21/2022   ID:  Jonathan [name], DOB 05-11-1945, MRN [id number]  PCP:  [name], MD  Cardiologist:   Kathlyn Sacramento, MD   Chief Complaint  Patient presents with   Other    Post CABG pt would like to discuss if he should be on blood thinner. Meds reviewed verbally with pt.      History of Present Illness: Jonathan [name] is a 77 y.o. male who is here today for follow-up visit regarding peripheral arterial disease and coronary artery disease status post recent CABG. He has past medical history of essential hypertension, diabetes mellitus, GERD, rheumatoid arthritis, chronic kidney disease and obesity.   He is followed for peripheral arterial disease mainly due to below the knee disease. Angiography was done in December 2023 and showed no significant aortoiliac disease, on the left side, there was mild to moderate SFA stenosis and no significant popliteal disease.  All tibial and peroneal vessels were occluded with reconstitution of the anterior tibial artery and posterior tibial artery via collaterals in the distal portion just above the ankle.  I attempted angioplasty of the anterior tibial artery antegrade and retrograde but was not able to cross due to long calcified occlusion.  I consulted Dr. Virl Cagey for bypass but the patient was found to have borderline quality vein conduits.  He had worsening lower extremity edema and exertional dyspnea with episodes of exertional chest pain. He had an echocardiogram done which showed an EF of 50 to 55% with mild mitral regurgitation.  Lexiscan Myoview was an intermediate risk study and showed medium sized defect in the inferior lateral location consistent with prior infarct with mild peri-infarct ischemia.   Cardiac catheterization was done on February 23 which showed severe three-vessel coronary artery disease.  Right heart catheterization showed moderately elevated wedge pressure and pulmonary  pressure. He underwent CABG on March 7 with LIMA to LAD, SVG to diagonal, SVG to OM1/OM 2 and SVG to right PDA.  He has been doing well overall with no recurrent angina.  Shortness of breath improved.  He continues to have significant lower extremity edema.  Fortunately, he has no lower extremity ulceration.  He continues to complain of some discomfort involving the left small toe.  Past Medical History:  Diagnosis Date   Arthritis    ra, sees dr Sedonia Small   Chronic kidney disease    Coronary artery disease    saw dr Lupita Shutter in [place] 20 yrs ago, released from cardiology   Diabetes mellitus without complication [facility])    type 2   Dyspnea 07/2022   Lately r/t CAD   GERD (gastroesophageal reflux disease)    Hypercholesteremia    Hypertension    Peripheral vascular disease ([place])    Stroke ([place]) 2007   "Light Stroke" per pt. No deficits   Urethral stricture     Past Surgical History:  Procedure Laterality Date   ABDOMINAL AORTOGRAM W/LOWER EXTREMITY N/A 04/20/2022   Procedure: ABDOMINAL AORTOGRAM W/LOWER EXTREMITY;  Surgeon: Wellington Hampshire, MD;  Location: [place] [facility];  Service: Cardiovascular;  Laterality: N/A;   BACK SURGERY     x 3 lower back   CARDIAC CATHETERIZATION  2000   small amt plaque relased from cardiology 20 yrs ago   CHOLECYSTECTOMY     COLONOSCOPY WITH PROPOFOL N/A 10/26/2021   Procedure: COLONOSCOPY WITH PROPOFOL;  Surgeon: Eloise Harman, DO;  [facility];  Service:  Endoscopy;  Laterality: N/A;  To arrive at [place] N/A 07/07/2022   Procedure: CORONARY ARTERY BYPASS GRAFTING (CABG) X5 USING OPEN LEFT INTERNAL MAMMARY ARTERY AND ENDOSCOPICALLY HARVESTED RIGHT GREATER SAPHENOUS VEIN;  Surgeon: Gaye Pollack, MD;  Location: [facility];  Service: Open Heart Surgery;  Laterality: N/A;   CYSTOSCOPY WITH URETHRAL DILATATION N/A 04/17/2019   Procedure: CYSTOSCOPY WITH URETHRAL DILATATION, CYSTOGRAM;  Surgeon: Ceasar Mons, MD;  Location[facility];  Service: Urology;  Laterality: N/A;   EYE SURGERY Bilateral    cataracts   IR RADIOLOGIST EVAL & MGMT  07/12/2021   PERIPHERAL VASCULAR BALLOON ANGIOPLASTY  04/20/2022   Procedure: PERIPHERAL VASCULAR BALLOON ANGIOPLASTY;  Surgeon: Wellington Hampshire, MD;  Location: [place] [facility];  Service: Cardiovascular;;  aborted; unable to cross   POLYPECTOMY  10/26/2021   Procedure: POLYPECTOMY INTESTINAL;  Surgeon: Eloise Harman, DO;  [facility];  Service: Endoscopy;;   RIGHT/LEFT HEART CATH AND CORONARY ANGIOGRAPHY Bilateral 06/24/2022   Procedure: RIGHT/LEFT HEART CATH AND CORONARY ANGIOGRAPHY;  Surgeon: Wellington Hampshire, MD;  Location: [place] [facility];  Service: Cardiovascular;  Laterality: Bilateral;   TEE WITHOUT CARDIOVERSION N/A 07/07/2022   Procedure: TRANSESOPHAGEAL ECHOCARDIOGRAM;  Surgeon: Gaye Pollack, MD;  Location: [facility];  Service: Open Heart Surgery;  Laterality: N/A;   urethral stricture surgery  15 yrs ago     Current Outpatient Medications  Medication Sig Dispense Refill   acetaminophen (TYLENOL) 325 MG tablet Take 2 tablets (650 mg total) by mouth every 4 (four) hours as needed.     albuterol (VENTOLIN HFA) 108 (90 Base) MCG/ACT inhaler Inhale 1-2 puffs into the lungs every 6 (six) hours as needed for wheezing or shortness of breath.     aspirin EC 81 MG tablet Take 81 mg by mouth daily. Swallow whole.     cholecalciferol (VITAMIN D3) 25 MCG (1000 UNIT) tablet Take 1,000 Units by mouth daily.     cyanocobalamin (VITAMIN B12) 1000 MCG tablet Take 1,000 mcg by mouth daily.     finasteride (PROSCAR) 5 MG tablet TAKE 1 TABLET BY MOUTH DAILY 123XX123 tablet 2   folic acid (FOLVITE) 1 MG tablet Take 1 mg by mouth daily.     gabapentin (NEURONTIN) 300 MG capsule Take 1 capsule (300 mg total) by mouth at bedtime. 30 capsule 5   glipiZIDE (GLUCOTROL) 5 MG tablet TAKE 1 TABLET BY MOUTH TWICE DAILY BEFORE A MEAL  30 tablet 0   hydrocortisone (ANUSOL-HC) 2.5 % rectal cream Place 1 Application rectally 2 (two) times daily. For 10 days. May repeat cycle if needed. 30 g 1   linaclotide (LINZESS) 145 MCG CAPS capsule Take 145 mcg by mouth daily as needed (constipation).     Menthol, Topical Analgesic, (BLUE-EMU MAXIMUM STRENGTH EX) Apply 1 Application topically daily as needed (pain).     methotrexate (RHEUMATREX) 2.5 MG tablet Take 5 tablets (12.5 mg total) by mouth every Sunday. Resume on Sunday 07/24/22. Caution:Chemotherapy. Protect from light. 5 tablet 0   metoprolol tartrate (LOPRESSOR) 50 MG tablet TAKE 1 TABLET BY MOUTH TWICE  DAILY 200 tablet 2   omeprazole (PRILOSEC) 20 MG capsule Take 1 capsule (20 mg total) by mouth daily. 30 capsule 5   predniSONE (DELTASONE) 1 MG tablet Take 1 mg by mouth daily with breakfast.     rosuvastatin (CRESTOR) 10 MG tablet TAKE 1 TABLET BY MOUTH DAILY 90 tablet 3  silodosin (RAPAFLO) 8 MG CAPS capsule Take 1 capsule (8 mg total) by mouth in the morning and at bedtime. 200 capsule 2   triamcinolone cream (KENALOG) 0.1 % Apply 1 Application topically daily as needed (itching). 80 g 1   vitamin E 180 MG (400 UNITS) capsule Take 400 Units by mouth daily.     No current facility-administered medications for this visit.    Allergies:   Ciprofloxacin, Mosquito (diagnostic), and Oxycodone-acetaminophen    Social History:  The patient  reports that he has never smoked. He has never used smokeless tobacco. He reports that he does not drink alcohol and does not use drugs.   Family History:  The patient's family history includes Heart Problems in his father.    ROS:  Please see the history of present illness.   Otherwise, review of systems are positive for none.   All other systems are reviewed and negative.    PHYSICAL EXAM: VS:  BP 104/62 (BP Location: Left Arm, Patient Position: Sitting, Cuff Size: Large)   Pulse 76   Ht 5\' 9"  (1.753 m)   Wt 223 lb (101.2 kg)   SpO2  96%   BMI 32.93 kg/m  , BMI Body mass index is 32.93 kg/m. GEN: Well nourished, well developed, in no acute distress  HEENT: normal  Neck: no JVD, carotid bruits, or masses Cardiac: RRR; no murmurs, rubs, or gallops, mild to moderate bilateral leg edema. Respiratory:  clear to auscultation bilaterally, normal work of breathing GI: soft, nontender, nondistended, + BS MS: no deformity or atrophy  Skin: warm and dry, no rash Neuro:  Strength and sensation are intact Psych: euthymic mood, full affect Vascular: Radial pulses normal bilaterally.   EKG:  EKG is ordered today. EKG showed sinus rhythm with first-degree AV block, LVH with repolarization abnormalities.   Recent Labs: 11/24/2021: TSH 3.070 07/05/2022: ALT 24 07/08/2022: Magnesium 2.6 07/10/2022: Hemoglobin 8.3; Platelets 132 07/12/2022: BUN 30; Creatinine, Ser 1.84; Potassium 4.5; Sodium 133    Lipid Panel    Component Value Date/Time   CHOL 135 11/24/2021 1606   TRIG 152 (H) 11/24/2021 1606   HDL 40 11/24/2021 1606   CHOLHDL 3.4 11/24/2021 1606   CHOLHDL 2.9 Ratio 07/16/2007 2228   VLDL 20 07/16/2007 2228   LDLCALC 69 11/24/2021 1606      Wt Readings from Last 3 Encounters:  07/21/22 223 lb (101.2 kg)  07/21/22 221 lb 9.6 oz (100.5 kg)  07/12/22 219 lb 14.4 oz (99.7 kg)          07/08/2021   10:12 AM  PAD Screen  Previous PAD dx? No  Previous surgical procedure? No  Pain with walking? No  Feet/toe relief with dangling? No  Painful, non-healing ulcers? No  Extremities discolored? Yes      ASSESSMENT AND PLAN:  1.  Coronary artery disease involving native coronary arteries status post recent CABG for severe three-vessel disease and angina.  He did not have myocardial infarction.  He is recovering nicely.  Recommend starting cardiac rehab next month.  Continue aspirin daily and will consider adding clopidogrel in the near future.  2. Peripheral arterial disease with rest pain: Fortunately, he currently has  no tissue loss and his rest pain improved.  I favor no revascularization at this time unless his symptoms worsen.  3.  Hyperlipidemia: Continue treatment with rosuvastatin.  Most recent lipid profile showed an LDL of 69.  4.  Essential hypertension: Blood pressure is controlled on current medications.  5.  Chronic systolic heart failure: Mildly reduced ejection fraction: His blood pressure is on the low side and thus not able to add Entresto at this time.  Will consider switching metoprolol to carvedilol.  If lower extremity edema persists, will consider adding a small dose furosemide.    Disposition: Follow-up in 2 months.  Signed,  Kathlyn Sacramento, MD  07/21/2022 11:11 AM    [place]

## 2022-07-21 NOTE — Progress Notes (Signed)
Established Patient Office Visit  Subjective:  Patient ID: Jonathan [name], male    DOB: May 31, 1945  Age: 77 y.o. MRN: [id number]  CC:  Chief Complaint  Patient presents with   Hospitalization [facility] follow up for open heart surgery.    HPI Jonathan [name] is a 77 y.o. male with past medical history of HTN, DM, GERD, RA, CKD stage 3, BPH and obesity who presents for f/u after recent hospitalization.  He had CABG X 5 on 07/07/22. Following the procedure, he separated from cardiopulmonary bypass without difficulty on dopamine at 2 mcg/kg/min that was used to optimize renal perfusion.  He was transferred to the surgical ICU.  Vital signs and hemodynamics remained stable.  He was weaned from mechanical ventilator and extubated by 11 PM on the day of surgery.  His chest tubes and monitoring lines were removed on the first postoperative day.  The dopamine was discontinued.  His renal function remained stable.  He was given stress dose steroids for the first 24 hours after surgery then started back on his usual prednisone 1 mg daily on postop day 2.  He is doing well since being discharged from the hospital.  Denies any episode of chest pain or dyspnea.  He has had chest tightness with the upper extremity movement in the incision area, but denies any local discharge or bleeding.  He is currently taking aspirin and statin.  He was not placed on DAPT (?).  He is going to see is cardiologist today.  He had blood loss anemia after the procedure.  He currently denies any active signs of bleeding, such as melena, hematochezia or hematuria.  Denies any easy bruising.  Outpatient interim summary: He was also complaining of a few month history of shortness of breath and fatigue as well as substernal chest burning that he thought was heartburn.  This has been occurring after eating but also during exertion such as walking.  2D echocardiogram showed a low normal ejection fraction of 50 to 55% with  grade 1 diastolic dysfunction and mild LVH.  There is no significant valvular abnormality.  A nuclear stress test was abnormal with evidence of prior infarction in the apical to basal inferolateral region.  There was no ischemia.  Ejection fraction was 53%.  He subsequently underwent cardiac catheterization on 06/24/2022 showing severe diffuse three-vessel coronary disease.  HTN: BP is well-controlled. Takes medications regularly. Patient denies headache, dizziness, chest pain, dyspnea or palpitations.   Type II DM: His HbA1C was 6.2 in 03/24.  He has been taking glipizide once daily. His blood glucose has been running between 80-120 now with Glipizide 5 mg QD.  Denies any fatigue, polyuria or polydipsia.  He also has a history of CKD stage III.  He has been taking Crestor for HLD.  RA: He takes methotrexate and low-dose oral prednisone for it.    Past Medical History:  Diagnosis Date   Arthritis    ra, sees dr Sedonia Small   Chronic kidney disease    Coronary artery disease    saw dr Lupita Shutter in [place] 20 yrs ago, released from cardiology   Diabetes mellitus without complication [facility])    type 2   Dyspnea 07/2022   Lately r/t CAD   GERD (gastroesophageal reflux disease)    Hypercholesteremia    Hypertension    Peripheral vascular disease ([place])    Stroke ([place]) 2007   "Light Stroke" per pt. No deficits   Urethral  stricture     Past Surgical History:  Procedure Laterality Date   ABDOMINAL AORTOGRAM W/LOWER EXTREMITY N/A 04/20/2022   Procedure: ABDOMINAL AORTOGRAM W/LOWER EXTREMITY;  Surgeon: Wellington Hampshire, MD;  Location: [place] [facility];  Service: Cardiovascular;  Laterality: N/A;   BACK SURGERY     x 3 lower back   CARDIAC CATHETERIZATION  2000   small amt plaque relased from cardiology 20 yrs ago   CHOLECYSTECTOMY     COLONOSCOPY WITH PROPOFOL N/A 10/26/2021   Procedure: COLONOSCOPY WITH PROPOFOL;  Surgeon: Eloise Harman, DO;  [facility];  Service:  Endoscopy;  Laterality: N/A;  To arrive at [place] N/A 07/07/2022   Procedure: CORONARY ARTERY BYPASS GRAFTING (CABG) X5 USING OPEN LEFT INTERNAL MAMMARY ARTERY AND ENDOSCOPICALLY HARVESTED RIGHT GREATER SAPHENOUS VEIN;  Surgeon: Gaye Pollack, MD;  Location: [place];  Service: Open Heart Surgery;  Laterality: N/A;   CYSTOSCOPY WITH URETHRAL DILATATION N/A 04/17/2019   Procedure: CYSTOSCOPY WITH URETHRAL DILATATION, CYSTOGRAM;  Surgeon: Ceasar Mons, MD;  Location[facility];  Service: Urology;  Laterality: N/A;   EYE SURGERY Bilateral    cataracts   IR RADIOLOGIST EVAL & MGMT  07/12/2021   PERIPHERAL VASCULAR BALLOON ANGIOPLASTY  04/20/2022   Procedure: PERIPHERAL VASCULAR BALLOON ANGIOPLASTY;  Surgeon: Wellington Hampshire, MD;  Location: [place] [facility];  Service: Cardiovascular;;  aborted; unable to cross   POLYPECTOMY  10/26/2021   Procedure: POLYPECTOMY INTESTINAL;  Surgeon: Eloise Harman, DO;  [facility];  Service: Endoscopy;;   RIGHT/LEFT HEART CATH AND CORONARY ANGIOGRAPHY Bilateral 06/24/2022   Procedure: RIGHT/LEFT HEART CATH AND CORONARY ANGIOGRAPHY;  Surgeon: Wellington Hampshire, MD;  Location: [place] [facility];  Service: Cardiovascular;  Laterality: Bilateral;   TEE WITHOUT CARDIOVERSION N/A 07/07/2022   Procedure: TRANSESOPHAGEAL ECHOCARDIOGRAM;  Surgeon: Gaye Pollack, MD;  [facility];  Service: Open Heart Surgery;  Laterality: N/A;   urethral stricture surgery  15 yrs ago    Family History  Problem Relation Age of Onset   Heart Problems Father     Social History   Socioeconomic History   Marital status: Married    Spouse name: Not on file   Number of children: Not on file   Years of education: Not on file   Highest education level: Not on file  Occupational History   Not on file  Tobacco Use   Smoking status: Never   Smokeless tobacco: Never  Vaping Use   Vaping Use: Never used   Substance and Sexual Activity   Alcohol use: No   Drug use: No   Sexual activity: Yes  Other Topics Concern   Not on file  Social History Narrative   Not on file   Social Determinants of Health   Financial Resource Strain: Low Risk  (11/15/2021)   Overall Financial Resource Strain (CARDIA)    Difficulty of Paying Living Expenses: Not hard at all  Food Insecurity: No Food Insecurity (07/11/2022)   Hunger Vital Sign    Worried About Running Out of Food in the Last Year: Never true    Ran Out of Food in the Last Year: Never true  Transportation Needs: No Transportation Needs (07/11/2022)   PRAPARE - Hydrologist (Medical): No    Lack of Transportation (Non-Medical): No  Physical Activity: Sufficiently Active (11/15/2021)   Exercise Vital Sign    Days of  Exercise per Week: 5 days    Minutes of Exercise per Session: 30 min  Stress: No Stress Concern Present (11/15/2021)   [place]    Feeling of Stress : Not at all  Social Connections: Moderately Integrated (11/15/2021)   Social Connection and Isolation Panel [NHANES]    Frequency of Communication with Friends and Family: Three times a week    Frequency of Social Gatherings with Friends and Family: Twice a week    Attends Religious Services: More than 4 times per year    Active Member of [employer] or Organizations: No    Attends Archivist Meetings: Never    Marital Status: Married  Human resources officer Violence: Not At Risk (07/11/2022)   Humiliation, Afraid, Rape, and Kick questionnaire    Fear of Current or Ex-Partner: No    Emotionally Abused: No    Physically Abused: No    Sexually Abused: No    Outpatient Medications Prior to Visit  Medication Sig Dispense Refill   acetaminophen (TYLENOL) 325 MG tablet Take 2 tablets (650 mg total) by mouth every 4 (four) hours as needed.     albuterol (VENTOLIN HFA) 108 (90 Base) MCG/ACT  inhaler Inhale 1-2 puffs into the lungs every 6 (six) hours as needed for wheezing or shortness of breath.     aspirin EC 81 MG tablet Take 81 mg by mouth daily. Swallow whole.     cholecalciferol (VITAMIN D3) 25 MCG (1000 UNIT) tablet Take 1,000 Units by mouth daily.     cyanocobalamin (VITAMIN B12) 1000 MCG tablet Take 1,000 mcg by mouth daily.     finasteride (PROSCAR) 5 MG tablet TAKE 1 TABLET BY MOUTH DAILY 123XX123 tablet 2   folic acid (FOLVITE) 1 MG tablet Take 1 mg by mouth daily.     gabapentin (NEURONTIN) 300 MG capsule Take 1 capsule (300 mg total) by mouth at bedtime. 30 capsule 5   glipiZIDE (GLUCOTROL) 5 MG tablet TAKE 1 TABLET BY MOUTH TWICE DAILY BEFORE A MEAL 30 tablet 0   hydrocortisone (ANUSOL-HC) 2.5 % rectal cream Place 1 Application rectally 2 (two) times daily. For 10 days. May repeat cycle if needed. 30 g 1   linaclotide (LINZESS) 145 MCG CAPS capsule Take 145 mcg by mouth daily as needed (constipation).     Menthol, Topical Analgesic, (BLUE-EMU MAXIMUM STRENGTH EX) Apply 1 Application topically daily as needed (pain).     methotrexate (RHEUMATREX) 2.5 MG tablet Take 5 tablets (12.5 mg total) by mouth every Sunday. Resume on Sunday 07/24/22. Caution:Chemotherapy. Protect from light. 5 tablet 0   metoprolol tartrate (LOPRESSOR) 50 MG tablet TAKE 1 TABLET BY MOUTH TWICE  DAILY 200 tablet 2   omeprazole (PRILOSEC) 20 MG capsule Take 1 capsule (20 mg total) by mouth daily. 30 capsule 5   predniSONE (DELTASONE) 1 MG tablet Take 1 mg by mouth daily with breakfast. (Patient not taking: Reported on 07/13/2022)     rosuvastatin (CRESTOR) 10 MG tablet TAKE 1 TABLET BY MOUTH DAILY 90 tablet 3   silodosin (RAPAFLO) 8 MG CAPS capsule Take 1 capsule (8 mg total) by mouth in the morning and at bedtime. 200 capsule 2   triamcinolone cream (KENALOG) 0.1 % Apply 1 Application topically daily as needed (itching). 80 g 1   vitamin E 180 MG (400 UNITS) capsule Take 400 Units by mouth daily.     No  facility-administered medications prior to visit.    Allergies  Allergen  Reactions   Ciprofloxacin Itching   Mosquito (Diagnostic) Itching   Oxycodone-Acetaminophen Rash    ROS Review of Systems  Constitutional:  Positive for fatigue. Negative for chills and fever.  HENT:  Negative for congestion and sore throat.   Eyes:  Negative for pain and discharge.  Respiratory:  Negative for cough and shortness of breath.   Cardiovascular:  Negative for chest pain and palpitations.  Gastrointestinal:  Positive for constipation. Negative for nausea and vomiting.  Endocrine: Negative for polydipsia and polyuria.  Genitourinary:  Negative for dysuria and hematuria.  Musculoskeletal:  Positive for arthralgias and back pain. Negative for neck pain and neck stiffness.  Skin:  Negative for rash.  Neurological:  Positive for numbness. Negative for weakness and headaches.  Psychiatric/Behavioral:  Negative for agitation and behavioral problems.       Objective:    Physical Exam Vitals reviewed.  Constitutional:      General: He is not in acute distress.    Appearance: He is obese. He is not diaphoretic.  HENT:     Head: Normocephalic and atraumatic.     Nose: Nose normal.     Mouth/Throat:     Mouth: Mucous membranes are moist.  Eyes:     General: No scleral icterus.    Extraocular Movements: Extraocular movements intact.  Cardiovascular:     Rate and Rhythm: Normal rate and regular rhythm.     Heart sounds: Normal heart sounds. No murmur heard. Pulmonary:     Breath sounds: Normal breath sounds. No wheezing or rales.  Musculoskeletal:        General: Tenderness (Left hip area, ROM at hip limited due to pain) present.     Cervical back: Neck supple. No tenderness.     Right lower leg: No edema.     Left lower leg: No edema.  Skin:    General: Skin is warm.     Findings: No rash.     Comments: Sternotomy scar, site C/D/I  Neurological:     General: No focal deficit present.      Mental Status: He is alert and oriented to person, place, and time.  Psychiatric:        Mood and Affect: Mood normal.        Behavior: Behavior normal.     BP 130/71 (BP Location: Left Arm, Patient Position: Sitting, Cuff Size: Large)   Pulse 88   Ht 5\' 9"  (1.753 m)   Wt 221 lb 9.6 oz (100.5 kg)   SpO2 97%   BMI 32.72 kg/m  Wt Readings from Last 3 Encounters:  07/21/22 221 lb 9.6 oz (100.5 kg)  07/12/22 219 lb 14.4 oz (99.7 kg)  07/05/22 218 lb (98.9 kg)    Lab Results  Component Value Date   TSH 3.070 11/24/2021   Lab Results  Component Value Date   WBC 11.0 (H) 07/10/2022   HGB 8.3 (L) 07/10/2022   HCT 26.6 (L) 07/10/2022   MCV 93.3 07/10/2022   PLT 132 (L) 07/10/2022   Lab Results  Component Value Date   NA 133 (L) 07/12/2022   K 4.5 07/12/2022   CO2 22 07/12/2022   GLUCOSE 161 (H) 07/12/2022   BUN 30 (H) 07/12/2022   CREATININE 1.84 (H) 07/12/2022   BILITOT 0.7 07/05/2022   ALKPHOS 80 07/05/2022   AST 30 07/05/2022   ALT 24 07/05/2022   PROT 7.7 07/05/2022   ALBUMIN 3.0 (L) 07/05/2022   CALCIUM 7.8 (L) 07/12/2022   ANIONGAP  6 07/12/2022   EGFR 35 (L) 03/28/2022   Lab Results  Component Value Date   CHOL 135 11/24/2021   Lab Results  Component Value Date   HDL 40 11/24/2021   Lab Results  Component Value Date   LDLCALC 69 11/24/2021   Lab Results  Component Value Date   TRIG 152 (H) 11/24/2021   Lab Results  Component Value Date   CHOLHDL 3.4 11/24/2021   Lab Results  Component Value Date   HGBA1C 6.1 (H) 07/05/2022      Assessment & Plan:   S/P CABG x 5 For multi-vessel CAD On Aspirin and statin, advised to stay compliant Check CBC and lipid profile Advised to ask Cardiologist about Plavix or Brilinta, needs to discuss Cardiac rehab as well  Postoperative anemia S/p CABG Hb: 8.3 Baseline Hb ~12 Check CBC No signs of active bleeding currently  Coronary artery disease S/p CABG Followed by Cardiology On Aspirin and  statin Needs to discuss Plavix or Brilinta with Cardiologist  PAD (peripheral artery disease) ([place]) On aspirin and statin Followed by cardiology  Type II diabetes mellitus with nephropathy ([place]) Lab Results  Component Value Date   HGBA1C 6.1 (H) 07/05/2022   Well-controlled On Glipizide 5 mg QD Blood glucose at home has been around 80-120 Recently decreased dose of steroid as well, which should improve glycemic profile Advised to follow diabetic diet On ARB F/u CMP and HbA1C Diabetic eye exam: Advised to follow up with Ophthalmology for diabetic eye exam  Rheumatoid arthritis ([place]) On Methotrexate and Prednisone F/u with Dr Amil Amen  Chronic kidney disease, stage III (moderate) ([place]) Check CMP Avoid nephrotoxic agents On ARB  Hospital discharge follow-up Hospital chart reviewed, including discharge summary Medications reconciled and reviewed with the patient in detail   No orders of the defined types were placed in this encounter.   Follow-up: Return in about 3 months (around 10/21/2022).    [name], MD

## 2022-07-21 NOTE — Assessment & Plan Note (Signed)
Lab Results  Component Value Date   HGBA1C 6.1 (H) 07/05/2022   Well-controlled On Glipizide 5 mg QD Blood glucose at home has been around 80-120 Recently decreased dose of steroid as well, which should improve glycemic profile Advised to follow diabetic diet On ARB F/u CMP and HbA1C Diabetic eye exam: Advised to follow up with Ophthalmology for diabetic eye exam

## 2022-07-21 NOTE — Assessment & Plan Note (Signed)
Check CMP °Avoid nephrotoxic agents °On ARB °

## 2022-07-21 NOTE — Assessment & Plan Note (Signed)
S/p CABG Hb: 8.3 Baseline Hb ~12 Check CBC No signs of active bleeding currently

## 2022-07-21 NOTE — Assessment & Plan Note (Signed)
On Methotrexate and Prednisone F/u with Dr Beekman 

## 2022-07-21 NOTE — Patient Instructions (Addendum)
Please continue to take medications as prescribed.  Please continue to follow low carb diet and perform moderate exercise/walking at least 150 mins/week.  Please ask your Cardiologist about Plavix or Brilinta.

## 2022-07-21 NOTE — Assessment & Plan Note (Signed)
For multi-vessel CAD On Aspirin and statin, advised to stay compliant Check CBC and lipid profile Advised to ask Cardiologist about Plavix or Brilinta, needs to discuss Cardiac rehab as well

## 2022-07-22 ENCOUNTER — Other Ambulatory Visit: Payer: Self-pay | Admitting: Internal Medicine

## 2022-07-22 ENCOUNTER — Telehealth: Payer: Self-pay | Admitting: Internal Medicine

## 2022-07-22 DIAGNOSIS — J309 Allergic rhinitis, unspecified: Secondary | ICD-10-CM

## 2022-07-22 LAB — CBC WITH DIFFERENTIAL/PLATELET
Basophils Absolute: 0 10*3/uL (ref 0.0–0.2)
Basos: 0 %
EOS (ABSOLUTE): 0.2 10*3/uL (ref 0.0–0.4)
Eos: 3 %
Hematocrit: 30.3 % — ABNORMAL LOW (ref 37.5–51.0)
Hemoglobin: 9.6 g/dL — ABNORMAL LOW (ref 13.0–17.7)
Immature Grans (Abs): 0 10*3/uL (ref 0.0–0.1)
Immature Granulocytes: 0 %
Lymphocytes Absolute: 1.5 10*3/uL (ref 0.7–3.1)
Lymphs: 22 %
MCH: 28.4 pg (ref 26.6–33.0)
MCHC: 31.7 g/dL (ref 31.5–35.7)
MCV: 90 fL (ref 79–97)
Monocytes Absolute: 0.9 10*3/uL (ref 0.1–0.9)
Monocytes: 13 %
Neutrophils Absolute: 4.3 10*3/uL (ref 1.4–7.0)
Neutrophils: 62 %
Platelets: 254 10*3/uL (ref 150–450)
RBC: 3.38 x10E6/uL — ABNORMAL LOW (ref 4.14–5.80)
RDW: 14.5 % (ref 11.6–15.4)
WBC: 6.9 10*3/uL (ref 3.4–10.8)

## 2022-07-22 LAB — CMP14+EGFR
ALT: 27 IU/L (ref 0–44)
AST: 38 IU/L (ref 0–40)
Albumin/Globulin Ratio: 0.9 — ABNORMAL LOW (ref 1.2–2.2)
Albumin: 3.5 g/dL — ABNORMAL LOW (ref 3.8–4.8)
Alkaline Phosphatase: 126 IU/L — ABNORMAL HIGH (ref 44–121)
BUN/Creatinine Ratio: 13 (ref 10–24)
BUN: 18 mg/dL (ref 8–27)
Bilirubin Total: 0.4 mg/dL (ref 0.0–1.2)
CO2: 21 mmol/L (ref 20–29)
Calcium: 9 mg/dL (ref 8.6–10.2)
Chloride: 100 mmol/L (ref 96–106)
Creatinine, Ser: 1.39 mg/dL — ABNORMAL HIGH (ref 0.76–1.27)
Globulin, Total: 4.1 g/dL (ref 1.5–4.5)
Glucose: 93 mg/dL (ref 70–99)
Potassium: 4.7 mmol/L (ref 3.5–5.2)
Sodium: 138 mmol/L (ref 134–144)
Total Protein: 7.6 g/dL (ref 6.0–8.5)
eGFR: 53 mL/min/{1.73_m2} — ABNORMAL LOW (ref >59)

## 2022-07-22 LAB — LIPID PANEL
Chol/HDL Ratio: 2.9 ratio (ref 0.0–5.0)
Cholesterol, Total: 118 mg/dL (ref 100–199)
HDL: 41 mg/dL (ref >39)
LDL Chol Calc (NIH): 59 mg/dL (ref 0–99)
Triglycerides: 91 mg/dL (ref 0–149)
VLDL Cholesterol Cal: 18 mg/dL (ref 5–40)

## 2022-07-22 MED ORDER — LEVOCETIRIZINE DIHYDROCHLORIDE 5 MG PO TABS: 5.0000 mg | ORAL_TABLET | Freq: Every evening | ORAL | 5 refills | Status: DC

## 2022-07-22 NOTE — Telephone Encounter (Signed)
Pt called in had visit on 3/21 forgot to mention to provider that nose has been runny from allergies. Wants to see if provider can send something in to help. Wants a cll back in regard.

## 2022-07-22 NOTE — Telephone Encounter (Signed)
Patient aware.

## 2022-07-27 ENCOUNTER — Other Ambulatory Visit: Payer: Self-pay | Admitting: Cardiovascular Disease

## 2022-07-28 ENCOUNTER — Ambulatory Visit (INDEPENDENT_AMBULATORY_CARE_PROVIDER_SITE_OTHER): Payer: 59 | Admitting: Podiatry

## 2022-07-28 ENCOUNTER — Encounter: Payer: Self-pay | Admitting: Podiatry

## 2022-07-28 ENCOUNTER — Ambulatory Visit: Payer: Medicare Other | Admitting: Internal Medicine

## 2022-07-28 DIAGNOSIS — M2041 Other hammer toe(s) (acquired), right foot: Secondary | ICD-10-CM

## 2022-07-28 DIAGNOSIS — M79675 Pain in left toe(s): Secondary | ICD-10-CM | POA: Diagnosis not present

## 2022-07-28 DIAGNOSIS — L84 Corns and callosities: Secondary | ICD-10-CM | POA: Diagnosis not present

## 2022-07-28 DIAGNOSIS — B351 Tinea unguium: Secondary | ICD-10-CM

## 2022-07-28 DIAGNOSIS — M79674 Pain in right toe(s): Secondary | ICD-10-CM | POA: Diagnosis not present

## 2022-07-28 DIAGNOSIS — E1142 Type 2 diabetes mellitus with diabetic polyneuropathy: Secondary | ICD-10-CM

## 2022-07-28 DIAGNOSIS — L97522 Non-pressure chronic ulcer of other part of left foot with fat layer exposed: Secondary | ICD-10-CM

## 2022-07-28 DIAGNOSIS — M2042 Other hammer toe(s) (acquired), left foot: Secondary | ICD-10-CM

## 2022-07-28 NOTE — Addendum Note (Signed)
Addended by: Yevonne Pax on: 07/28/2022 08:38 AM   Modules accepted: Level of Service

## 2022-07-28 NOTE — Progress Notes (Addendum)
Pt was late for apt, allowed him to be seen    Subjective:  Patient ID: Jonathan [name], male    DOB: Mar 19, 1946,  MRN: [id number]  Chief Complaint  Patient presents with   Foot Ulcer    Follow up ulcer 5th toe left   "Its still hurting, it might look a little better though"    77 y.o. male presents follow-up of small ulceration interdigitally on the left fifth toe.  Patient recently underwent coronary artery bypass grafting.  States he thinks the wound is doing slightly better on the fifth toe on the left foot.  He recently had the bypass graft so he cannot have surgery for 6 to 12 months.  He says the pain is somewhat controlled after debridement in the office.  Also having trouble toe trimming his nails which are thickened elongated and dystrophic x 10  Past Medical History:  Diagnosis Date   Arthritis    ra, sees dr Sedonia Small   Chronic kidney disease    Coronary artery disease    saw dr Lupita Shutter in [place] 20 yrs ago, released from cardiology   Diabetes mellitus without complication [facility])    type 2   Dyspnea 07/2022   Lately r/t CAD   GERD (gastroesophageal reflux disease)    Hypercholesteremia    Hypertension    Peripheral vascular disease ([place])    Stroke ([place]) 2007   "Light Stroke" per pt. No deficits   Urethral stricture     Allergies  Allergen Reactions   Ciprofloxacin Itching   Mosquito (Diagnostic) Itching   Oxycodone-Acetaminophen Rash    ROS: Negative except as per HPI above  Objective:  General: AAO x3, NAD  Dermatological: Attention to the medial aspect of the 5th toe left foot there is hyperkeratotic lesion present at the fifth toe without underlying ulceration.  Nails are thickened elongated dystrophic and discolored x 5 bilateral foot  Vascular:  Dorsalis Pedis artery and Posterior Tibial artery pedal pulses are 1/4 bilateral.  Capillary fill time < 3 sec to all digits.   Neruologic: Grossly intact via light touch bilateral. Protective threshold  intact to all sites bilateral.   Musculoskeletal: No gross boney pedal deformities bilateral. No pain, crepitus, or limitation noted with foot and ankle range of motion bilateral. Muscular strength 5/5 in all groups tested bilateral.  Gait: Unassisted, Nonantalgic.   Radiographs:  Date: 05/05/22 XR both feet Weightbearing AP/Lateral/Oblique   Findings: digital contractures of the 2nd toe bilaterally Assessment:   1. Pre-ulcerative calluses   2. Ulcer of left foot with fat layer exposed ([place])   3. Pain due to onychomycosis of toenails of both feet   4. Hammer toes of both feet   5. DM type 2 with diabetic peripheral neuropathy ([place])     Plan:  Patient was evaluated and treated and all questions answered.  Ulcer medial aspect of the L 5th toe to subcutaneous fat tissue. - healed with callus tissue present -We discussed the etiology and factors that are a part of the wound healing process.  We also discussed the risk of infection both soft tissue and osteomyelitis from open ulceration.  Discussed the risk of limb loss if this happens or worsens. -Debrided callus as below -Continue off-loading with gauze between toes.  Also dispensed gel toe caps for the patient to try and pad the area and prevent pressure in between the fourth and fifth toe.  -Last antibiotics: No abx currently indicated -Imaging: x-ray reviewed, shows no  signs of erosions, osteolysis, osteomyelitis or emphysema. -Continue with local wound care at this time. - All symptomatic hyperkeratoses x1 were safely debrided with a sterile #15 blade to patient's level of comfort without incident. We discussed preventative and palliative care of these lesions including supportive and accommodative shoegear, padding, prefabricated and custom molded accommodative orthoses, use of a pumice stone and lotions/creams daily.   #Onychomycosis with pain  -Nails palliatively debrided as below. -Educated on self-care  Procedure: Nail  Debridement Rationale: Pain Type of Debridement: manual, sharp debridement. Instrumentation: Nail nipper, rotary burr. Number of Nails: 10  No follow-ups on file.          Everitt Amber, DPM [facility]

## 2022-07-28 NOTE — Addendum Note (Signed)
Addended by: Ames Coupe F on: 07/28/2022 08:55 AM   Modules accepted: Orders, Level of Service

## 2022-08-02 ENCOUNTER — Other Ambulatory Visit: Payer: Self-pay | Admitting: Surgery

## 2022-08-02 DIAGNOSIS — Z951 Presence of aortocoronary bypass graft: Secondary | ICD-10-CM

## 2022-08-03 ENCOUNTER — Telehealth: Payer: Self-pay

## 2022-08-03 ENCOUNTER — Encounter: Payer: Self-pay | Admitting: Surgery

## 2022-08-03 ENCOUNTER — Ambulatory Visit (HOSPITAL_COMMUNITY)
Admission: RE | Admit: 2022-08-03 | Discharge: 2022-08-03 | Disposition: A | Discharge status: Home / Self Care | Payer: 59 | Source: Ambulatory Visit | Attending: Podiatry | Admitting: Podiatry

## 2022-08-03 ENCOUNTER — Ambulatory Visit (INDEPENDENT_AMBULATORY_CARE_PROVIDER_SITE_OTHER): Payer: Self-pay | Admitting: Surgery

## 2022-08-03 ENCOUNTER — Other Ambulatory Visit (INDEPENDENT_AMBULATORY_CARE_PROVIDER_SITE_OTHER): Payer: 59 | Admitting: Podiatry

## 2022-08-03 ENCOUNTER — Ambulatory Visit
Admission: RE | Admit: 2022-08-03 | Discharge: 2022-08-03 | Disposition: A | Discharge status: Home / Self Care | Payer: 59 | Source: Ambulatory Visit | Attending: Surgery | Admitting: Surgery

## 2022-08-03 VITALS — BP 105/69 | HR 74 | Resp 20 | Ht 69.0 in | Wt 218.0 lb

## 2022-08-03 DIAGNOSIS — Z951 Presence of aortocoronary bypass graft: Secondary | ICD-10-CM

## 2022-08-03 DIAGNOSIS — L84 Corns and callosities: Secondary | ICD-10-CM

## 2022-08-03 DIAGNOSIS — E1142 Type 2 diabetes mellitus with diabetic polyneuropathy: Secondary | ICD-10-CM

## 2022-08-03 DIAGNOSIS — I999 Unspecified disorder of circulatory system: Secondary | ICD-10-CM

## 2022-08-03 DIAGNOSIS — L97522 Non-pressure chronic ulcer of other part of left foot with fat layer exposed: Secondary | ICD-10-CM

## 2022-08-03 NOTE — Telephone Encounter (Signed)
Noted, thanks!

## 2022-08-03 NOTE — Progress Notes (Signed)
HPI: Patient returns for routine postoperative follow-up having undergone CABG x 5 on 07/07/2022. The patient's early postoperative recovery while in the hospital was notable for an uncomplicated postoperative course. Since hospital discharge the patient reports that he has been feeling well overall.  He is walking around his house without chest pain or shortness of breath.  Walking distances is limited due to lower extremity ischemia and a small ulceration interdigitally on the left fifth toe. He saw Dr. Fletcher Anon in the office recently.  Current Outpatient Medications  Medication Sig Dispense Refill   acetaminophen (TYLENOL) 325 MG tablet Take 2 tablets (650 mg total) by mouth every 4 (four) hours as needed.     albuterol (VENTOLIN HFA) 108 (90 Base) MCG/ACT inhaler Inhale 1-2 puffs into the lungs every 6 (six) hours as needed for wheezing or shortness of breath.     aspirin EC 81 MG tablet Take 81 mg by mouth daily. Swallow whole.     cholecalciferol (VITAMIN D3) 25 MCG (1000 UNIT) tablet Take 1,000 Units by mouth daily.     cyanocobalamin (VITAMIN B12) 1000 MCG tablet Take 1,000 mcg by mouth daily.     finasteride (PROSCAR) 5 MG tablet TAKE 1 TABLET BY MOUTH DAILY 123XX123 tablet 2   folic acid (FOLVITE) 1 MG tablet Take 1 mg by mouth daily.     gabapentin (NEURONTIN) 300 MG capsule Take 1 capsule (300 mg total) by mouth at bedtime. 30 capsule 5   glipiZIDE (GLUCOTROL) 5 MG tablet TAKE 1 TABLET BY MOUTH TWICE DAILY BEFORE A MEAL 30 tablet 0   hydrocortisone (ANUSOL-HC) 2.5 % rectal cream Place 1 Application rectally 2 (two) times daily. For 10 days. May repeat cycle if needed. 30 g 1   levocetirizine (XYZAL) 5 MG tablet Take 1 tablet (5 mg total) by mouth every evening. 30 tablet 5   linaclotide (LINZESS) 145 MCG CAPS capsule Take 145 mcg by mouth daily as needed (constipation).     Menthol, Topical Analgesic, (BLUE-EMU MAXIMUM STRENGTH EX) Apply 1 Application topically daily as needed (pain).      methotrexate (RHEUMATREX) 2.5 MG tablet Take 5 tablets (12.5 mg total) by mouth every Sunday. Resume on Sunday 07/24/22. Caution:Chemotherapy. Protect from light. 5 tablet 0   metoprolol tartrate (LOPRESSOR) 50 MG tablet TAKE 1 TABLET BY MOUTH TWICE  DAILY 200 tablet 2   omeprazole (PRILOSEC) 20 MG capsule Take 1 capsule (20 mg total) by mouth daily. 30 capsule 5   predniSONE (DELTASONE) 1 MG tablet Take 1 mg by mouth daily with breakfast.     rosuvastatin (CRESTOR) 10 MG tablet TAKE 1 TABLET BY MOUTH DAILY 90 tablet 3   silodosin (RAPAFLO) 8 MG CAPS capsule Take 1 capsule (8 mg total) by mouth in the morning and at bedtime. 200 capsule 2   triamcinolone cream (KENALOG) 0.1 % Apply 1 Application topically daily as needed (itching). 80 g 1   vitamin E 180 MG (400 UNITS) capsule Take 400 Units by mouth daily.     No current facility-administered medications for this visit.    Physical Exam: BP 105/69 (BP Location: Left Arm, Patient Position: Sitting)   Pulse 74   Resp 20   Ht 5\' 9"  (1.753 m)   Wt 218 lb (98.9 kg)   SpO2 95% Comment: RA  BMI 32.19 kg/m  He looks well. Cardiac exam shows a regular rate and rhythm with normal heart sounds.  There is no murmur. Lungs are clear. The chest incision is  healing well and the sternum is stable. His leg incisions are healing well.  There is moderate bilateral lower extremity edema to the knee.  He is wearing compression stockings.  Diagnostic Tests:  Narrative & Impression  CLINICAL DATA:  History of CABG   EXAM: CHEST - 2 VIEW   COMPARISON:  07/10/2022   FINDINGS: Previous median sternotomy and CABG. Heart size is normal. Mild linear scarring/atelectasis at the lung bases. Calcified granuloma at the right lung base. No visible effusion. No acute bone finding.   IMPRESSION: Previous median sternotomy and CABG. Mild linear scarring/atelectasis at the lung bases.     Electronically Signed   By: Nelson Chimes M.D.   On: 08/03/2022  12:16      Impression:  Overall I think he is recovering well from his surgery.  I told him he could return to driving when he and his wife feel comfortable with that.  I asked him not to lift anything heavier than 10 pounds for 3 months postoperatively.  He does have moderate bilateral lower leg edema and may benefit from a diuretic although he has stage III chronic kidney disease with a creatinine about 1.39 which increased to 2.15 postoperatively before returning to his baseline.  He is wearing lower extremity compression stockings to decrease edema.  I will leave that decision up to cardiology when they see him back.  Plan:  He has follow-up with cardiology later this month.  He will return to see me if he has any problems with his incisions.   Gaye Pollack, MD [facility] [phone]

## 2022-08-03 NOTE — Progress Notes (Signed)
Referral to vascular surgery placed

## 2022-08-04 ENCOUNTER — Other Ambulatory Visit: Payer: Self-pay

## 2022-08-04 ENCOUNTER — Telehealth: Payer: Self-pay | Admitting: Internal Medicine

## 2022-08-04 DIAGNOSIS — E1142 Type 2 diabetes mellitus with diabetic polyneuropathy: Secondary | ICD-10-CM

## 2022-08-04 MED ORDER — GABAPENTIN 300 MG PO CAPS
300.0000 mg | ORAL_CAPSULE | Freq: Every day | ORAL | 5 refills | Status: AC
Start: 2022-08-04 — End: ?

## 2022-08-04 MED ORDER — GLIPIZIDE 5 MG PO TABS: 5.0000 mg | ORAL_TABLET | Freq: Two times a day (BID) | ORAL | 0 refills | Status: DC

## 2022-08-04 NOTE — Telephone Encounter (Signed)
Refills sent to pharmacy. 

## 2022-08-04 NOTE — Telephone Encounter (Signed)
Prescription Request  08/04/2022  LOV: 07/21/2022  What is the name of the medication or equipment? gabapentin (NEURONTIN) 300 MG capsule [id number]   glipiZIDE (GLUCOTROL) 5 MG tablet [id number]   Have you contacted your pharmacy to request a refill? Yes   Which pharmacy would you like this sent to?  [facility]   Patient notified that their request is being sent to the clinical staff for review and that they should receive a response within 2 business days.   Please advise at [place]

## 2022-08-05 ENCOUNTER — Ambulatory Visit: Payer: 59 | Admitting: Vascular Surgery

## 2022-08-08 ENCOUNTER — Other Ambulatory Visit: Payer: Self-pay | Admitting: Internal Medicine

## 2022-08-25 ENCOUNTER — Other Ambulatory Visit: Payer: Self-pay | Admitting: Internal Medicine

## 2022-08-25 ENCOUNTER — Telehealth: Payer: Self-pay | Admitting: Internal Medicine

## 2022-08-25 DIAGNOSIS — I10 Essential (primary) hypertension: Secondary | ICD-10-CM

## 2022-08-25 MED ORDER — HYDROCHLOROTHIAZIDE 25 MG PO TABS
25.0000 mg | ORAL_TABLET | Freq: Every day | ORAL | 1 refills | Status: DC
Start: 2022-08-25 — End: 2022-11-02

## 2022-08-25 NOTE — Telephone Encounter (Signed)
Prescription Request  08/25/2022  LOV: 07/21/2022  What is the name of the medication or equipment? hydrochlorothiazide (HYDRODIURIL) 25 MG tablet [[id number]]   Have you contacted your pharmacy to request a refill? No   Which pharmacy would you like this sent to?  [facility] [address], [place] - [address] [place] [address] [place] [address] Phone: [phone] Fax: [phone]  [facility] [place], [place] - [address] [place] [address] Phone: [phone] Fax: [phone]  [facility] [address] [place] [address] Phone: [phone] Fax: [phone]    Patient notified that their request is being sent to the clinical staff for review and that they should receive a response within 2 business days.   Please advise at [place] [phone] (mobile)

## 2022-08-25 NOTE — Telephone Encounter (Signed)
No voice mail.

## 2022-08-26 ENCOUNTER — Other Ambulatory Visit: Payer: Self-pay | Admitting: Internal Medicine

## 2022-08-26 DIAGNOSIS — I10 Essential (primary) hypertension: Secondary | ICD-10-CM

## 2022-08-31 ENCOUNTER — Ambulatory Visit (INDEPENDENT_AMBULATORY_CARE_PROVIDER_SITE_OTHER): Payer: 59 | Admitting: Urology

## 2022-08-31 ENCOUNTER — Encounter: Payer: Self-pay | Admitting: Urology

## 2022-08-31 VITALS — BP 140/84 | HR 77

## 2022-08-31 DIAGNOSIS — R351 Nocturia: Secondary | ICD-10-CM | POA: Diagnosis not present

## 2022-08-31 DIAGNOSIS — N400 Enlarged prostate without lower urinary tract symptoms: Secondary | ICD-10-CM

## 2022-08-31 LAB — URINALYSIS, ROUTINE W REFLEX MICROSCOPIC
Bilirubin, UA: NEGATIVE
Glucose, UA: NEGATIVE
Ketones, UA: NEGATIVE
Leukocytes,UA: NEGATIVE
Nitrite, UA: NEGATIVE
Specific Gravity, UA: 1.015 (ref 1.005–1.030)
Urobilinogen, Ur: 0.2 mg/dL (ref 0.2–1.0)
pH, UA: 5.5 (ref 5.0–7.5)

## 2022-08-31 LAB — MICROSCOPIC EXAMINATION: Bacteria, UA: NONE SEEN

## 2022-08-31 LAB — BLADDER SCAN AMB NON-IMAGING: Scan Result: 225

## 2022-08-31 MED ORDER — SILODOSIN 8 MG PO CAPS: 8.0000 mg | ORAL_CAPSULE | Freq: Two times a day (BID) | ORAL | 3 refills | Status: DC

## 2022-08-31 MED ORDER — FINASTERIDE 5 MG PO TABS: 5.0000 mg | ORAL_TABLET | Freq: Every day | ORAL | 3 refills | Status: DC

## 2022-08-31 NOTE — Patient Instructions (Signed)

## 2022-08-31 NOTE — Progress Notes (Signed)
08/31/2022 10:47 AM   Jonathan [name] 1946/02/20 [id number]  Referring provider: Anabel Halon, MD [address] [place],  [place] [address]  Followup BPh and nocturia   HPI: Jonathan [name] is a 76yo here for followup for BPH and nocturia.  PVR 225cc. IPSS 12 QOL 2 on rapalfo 8mg  BID anf finasteride. Urine stream strong. Nocturia 1x. He had a CABG x5 on 07/07/2022. No other complaitns today  PMH: Past Medical History:  Diagnosis Date   Arthritis    ra, sees dr Alean Rinne   Chronic kidney disease    Coronary artery disease    saw dr Armanda Magic in [place] 20 yrs ago, released from cardiology   Diabetes mellitus without complication [facility])    type 2   Dyspnea 07/2022   Lately r/t CAD   GERD (gastroesophageal reflux disease)    Hypercholesteremia    Hypertension    Peripheral vascular disease (HCC)    Stroke (HCC) 2007   "Light Stroke" per pt. No deficits   Urethral stricture     Surgical History: Past Surgical History:  Procedure Laterality Date   ABDOMINAL AORTOGRAM W/LOWER EXTREMITY N/A 04/20/2022   Procedure: ABDOMINAL AORTOGRAM W/LOWER EXTREMITY;  Surgeon: Iran Ouch, MD;  [facility];  Service: Cardiovascular;  Laterality: N/A;   BACK SURGERY     x 3 lower back   CARDIAC CATHETERIZATION  2000   small amt plaque relased from cardiology 20 yrs ago   CHOLECYSTECTOMY     COLONOSCOPY WITH PROPOFOL N/A 10/26/2021   Procedure: COLONOSCOPY WITH PROPOFOL;  Surgeon: Lanelle Bal, DO;  [facility]: [facility];  Service: Endoscopy;  Laterality: N/A;  To arrive at 1200   CORONARY ARTERY BYPASS GRAFT N/A 07/07/2022   Procedure: CORONARY ARTERY BYPASS GRAFTING (CABG) X5 USING OPEN LEFT INTERNAL MAMMARY ARTERY AND ENDOSCOPICALLY HARVESTED RIGHT GREATER SAPHENOUS VEIN;  Surgeon: Alleen Borne, MD;  Location: [facility] OR;  Service: Open Heart Surgery;  Laterality: N/A;   CYSTOSCOPY WITH URETHRAL DILATATION N/A 04/17/2019   Procedure: CYSTOSCOPY WITH URETHRAL  DILATATION, CYSTOGRAM;  Surgeon: Rene Paci, MD;  Location: [facility];  Service: Urology;  Laterality: N/A;   EYE SURGERY Bilateral    cataracts   IR RADIOLOGIST EVAL & MGMT  07/12/2021   PERIPHERAL VASCULAR BALLOON ANGIOPLASTY  04/20/2022   Procedure: PERIPHERAL VASCULAR BALLOON ANGIOPLASTY;  Surgeon: Iran Ouch, MD;  [facility]: [facility];  Service: Cardiovascular;;  aborted; unable to cross   POLYPECTOMY  10/26/2021   Procedure: POLYPECTOMY INTESTINAL;  Surgeon: Lanelle Bal, DO;  [facility];  Service: Endoscopy;;   RIGHT/LEFT HEART CATH AND CORONARY ANGIOGRAPHY Bilateral 06/24/2022   Procedure: RIGHT/LEFT HEART CATH AND CORONARY ANGIOGRAPHY;  Surgeon: Iran Ouch, MD;  Location: [facility] INVASIVE CV LAB;  Service: Cardiovascular;  Laterality: Bilateral;   TEE WITHOUT CARDIOVERSION N/A 07/07/2022   Procedure: TRANSESOPHAGEAL ECHOCARDIOGRAM;  Surgeon: Alleen Borne, MD;  Location: [facility] OR;  Service: Open Heart Surgery;  Laterality: N/A;   urethral stricture surgery  15 yrs ago    Home Medications:  Allergies as of 08/31/2022       Reactions   Ciprofloxacin Itching   Mosquito (diagnostic) Itching   Oxycodone-acetaminophen Rash        Medication List        Accurate as of Aug 31, 2022 10:47 AM. If you have any questions, ask your nurse or doctor.  acetaminophen 325 MG tablet Commonly known as: TYLENOL Take 2 tablets (650 mg total) by mouth every 4 (four) hours as needed.   albuterol 108 (90 Base) MCG/ACT inhaler Commonly known as: VENTOLIN HFA Inhale 1-2 puffs into the lungs every 6 (six) hours as needed for wheezing or shortness of breath.   aspirin EC 81 MG tablet Take 81 mg by mouth daily. Swallow whole.   BLUE-EMU MAXIMUM STRENGTH EX Apply 1 Application topically daily as needed (pain).   cholecalciferol 25 MCG (1000 UNIT) tablet Commonly known as: VITAMIN D3 Take 1,000 Units by mouth daily.    cyanocobalamin 1000 MCG tablet Commonly known as: VITAMIN B12 Take 1,000 mcg by mouth daily.   finasteride 5 MG tablet Commonly known as: PROSCAR TAKE 1 TABLET BY MOUTH DAILY   folic acid 1 MG tablet Commonly known as: FOLVITE Take 1 mg by mouth daily.   gabapentin 300 MG capsule Commonly known as: NEURONTIN Take 1 capsule (300 mg total) by mouth at bedtime.   glipiZIDE 5 MG tablet Commonly known as: GLUCOTROL Take 1 tablet (5 mg total) by mouth 2 (two) times daily before a meal.   hydrochlorothiazide 25 MG tablet Commonly known as: HYDRODIURIL Take 1 tablet (25 mg total) by mouth daily.   hydrocortisone 2.5 % rectal cream Commonly known as: ANUSOL-HC Place 1 Application rectally 2 (two) times daily. For 10 days. May repeat cycle if needed.   levocetirizine 5 MG tablet Commonly known as: XYZAL Take 1 tablet (5 mg total) by mouth every evening.   Linzess 145 MCG Caps capsule Generic drug: linaclotide Take 145 mcg by mouth daily as needed (constipation).   methotrexate 2.5 MG tablet Commonly known as: RHEUMATREX Take 5 tablets (12.5 mg total) by mouth every Sunday. Resume on Sunday 07/24/22. Caution:Chemotherapy. Protect from light.   metoprolol tartrate 50 MG tablet Commonly known as: LOPRESSOR TAKE 1 TABLET BY MOUTH TWICE  DAILY   omeprazole 20 MG capsule Commonly known as: PRILOSEC Take 1 capsule (20 mg total) by mouth daily.   predniSONE 1 MG tablet Commonly known as: DELTASONE Take 1 mg by mouth daily with breakfast.   rosuvastatin 10 MG tablet Commonly known as: CRESTOR TAKE 1 TABLET BY MOUTH DAILY   silodosin 8 MG Caps capsule Commonly known as: RAPAFLO Take 1 capsule (8 mg total) by mouth in the morning and at bedtime.   triamcinolone cream 0.1 % Commonly known as: KENALOG Apply 1 Application topically daily as needed (itching).   vitamin E 180 MG (400 UNITS) capsule Take 400 Units by mouth daily.        Allergies:  Allergies  Allergen  Reactions   Ciprofloxacin Itching   Mosquito (Diagnostic) Itching   Oxycodone-Acetaminophen Rash    Family History: Family History  Problem Relation Age of Onset   Heart Problems Father     Social History:  reports that he has never smoked. He has never used smokeless tobacco. He reports that he does not drink alcohol and does not use drugs.  ROS: All other review of systems were reviewed and are negative except what is noted above in HPI  Physical Exam: BP (!) 140/84   Pulse 77   Constitutional:  Alert and oriented, No acute distress. HEENT: [place] AT, moist mucus membranes.  Trachea midline, no masses. Cardiovascular: No clubbing, cyanosis, or edema. Respiratory: Normal respiratory effort, no increased work of breathing. GI: Abdomen is soft, nontender, nondistended, no abdominal masses GU: No CVA tenderness.  Lymph: No cervical or  inguinal lymphadenopathy. Skin: No rashes, bruises or suspicious lesions. Neurologic: Grossly intact, no focal deficits, moving all 4 extremities. Psychiatric: Normal mood and affect.  Laboratory Data: Lab Results  Component Value Date   WBC 6.9 07/21/2022   HGB 9.6 (L) 07/21/2022   HCT 30.3 (L) 07/21/2022   MCV 90 07/21/2022   PLT 254 07/21/2022    Lab Results  Component Value Date   CREATININE 1.39 (H) 07/21/2022    No results found for: "PSA"  No results found for: "TESTOSTERONE"  Lab Results  Component Value Date   HGBA1C 6.1 (H) 07/05/2022    Urinalysis    Component Value Date/Time   COLORURINE YELLOW 07/05/2022 1651   APPEARANCEUR CLEAR 07/05/2022 1651   APPEARANCEUR Clear 06/01/2022 1519   LABSPEC 1.011 07/05/2022 1651   PHURINE 5.0 07/05/2022 1651   GLUCOSEU NEGATIVE 07/05/2022 1651   HGBUR MODERATE (A) 07/05/2022 1651   HGBUR negative 04/16/2007 0000   BILIRUBINUR NEGATIVE 07/05/2022 1651   BILIRUBINUR Negative 06/01/2022 1519   KETONESUR NEGATIVE 07/05/2022 1651   PROTEINUR NEGATIVE 07/05/2022 1651   UROBILINOGEN  0.2 12/28/2020 1532   UROBILINOGEN negative 04/16/2007 0000   NITRITE NEGATIVE 07/05/2022 1651   LEUKOCYTESUR NEGATIVE 07/05/2022 1651    Lab Results  Component Value Date   LABMICR See below: 06/01/2022   WBCUA 0-5 06/01/2022   LABEPIT 0-10 06/01/2022   MUCUS Present 05/12/2021   BACTERIA RARE (A) 07/05/2022    Pertinent Imaging:  No results found for this or any previous visit.  No results found for this or any previous visit.  No results found for this or any previous visit.  No results found for this or any previous visit.  Results for orders placed during the hospital encounter of 12/17/20  US Renal  Narrative CLINICAL DATA:  Recurrent UTI in a 77 year old male.  EXAM: RENAL / URINARY TRACT ULTRASOUND COMPLETE  COMPARISON:  More remote studies dating back to 2005.  FINDINGS: Right Kidney:  Renal measurements: 9.5 x 4.0 x 5.1 cm = volume: 100.1 mL. Limited exam due to body habitus, no hydronephrosis. Mild parenchymal thinning.  Left Kidney:  Renal measurements: 9.8 x 5.2 x 5.2 cm = volume: 138.8 mL. Limited exam due to overlying bowel gas and body habitus. No hydronephrosis. Mild parenchymal thinning. Small cyst arising from the lower pole measuring 1.2 x 1.0 x 1.0 cm.  Bladder:  Appears normal for degree of bladder distention.  Other:  Increased hepatic echogenicity.  IMPRESSION: Mild cortical thinning without signs of hydronephrosis. Limited exam due to body habitus and bowel gas.  Small LEFT lower pole cyst.  Hepatic steatosis.   Electronically Signed By: Donzetta Kohut M.D. On: 12/18/2020 14:12  No valid procedures specified. Results for orders placed during the hospital encounter of 03/24/21  CT HEMATURIA WORKUP  Narrative CLINICAL DATA:  Microhematuria  EXAM: CT ABDOMEN AND PELVIS WITHOUT AND WITH CONTRAST  TECHNIQUE: Multidetector CT imaging of the abdomen and pelvis was performed following the standard protocol before  and following the bolus administration of intravenous contrast.  CONTRAST:  OMNIPAQUE IOHEXOL 350 MG/ML SOLN  COMPARISON:  None.  FINDINGS: Lower chest: No acute abnormality. Bandlike scarring of the bilateral lung bases.  Hepatobiliary: No focal liver abnormality is seen. Status post cholecystectomy. No biliary dilatation.  Pancreas: Unremarkable. No pancreatic ductal dilatation or surrounding inflammatory changes.  Spleen: Normal in size without significant abnormality.  Adrenals/Urinary Tract: Adrenal glands are unremarkable. Occasional small bilateral renal cysts. Kidneys are otherwise normal, without  renal calculi, solid lesion, or hydronephrosis. No evidence of urinary tract filling defect on delayed phase imaging. Thickening of the urinary bladder wall. Diverticulum of the anterior bladder dome (series 7, image 74, series 12, image 69).  Stomach/Bowel: Stomach is within normal limits. Appendix appears normal. No evidence of bowel wall thickening, distention, or inflammatory changes.  Vascular/Lymphatic: Aortic atherosclerosis. No enlarged abdominal or pelvic lymph nodes.  Reproductive: Prostatomegaly with median lobe hypertrophy.  Other: No abdominal wall hernia or abnormality. No abdominopelvic ascites.  Musculoskeletal: No acute or significant osseous findings.  IMPRESSION: 1. No evidence of urinary tract calculus, mass, or hydronephrosis. No evidence of urinary tract filling defect on delayed phase imaging. 2. Thickening of the urinary bladder wall, likely related to chronic outlet obstruction although infectious or inflammatory cystitis is a differential consideration in the setting of hematuria. 3. Prostatomegaly.  Aortic Atherosclerosis (ICD10-I70.0).   Electronically Signed By: Jearld Lesch M.D. On: 03/26/2021 10:13  No results found for this or any previous visit.   Assessment & Plan:    1. Benign prostatic hyperplasia, unspecified  whether lower urinary tract symptoms present -continue rapaflo 8mg  BId and finasteride - Urinalysis, Routine w reflex microscopic - BLADDER SCAN AMB NON-IMAGING  2. Nocturia -continue rapaflo 8mg  bID and finasteride   No follow-ups on file.  Wilkie Aye, MD  [facility] [place]

## 2022-08-31 NOTE — Progress Notes (Signed)
post void residual=225 ?

## 2022-09-01 NOTE — Progress Notes (Deleted)
Office Note     CC: Bilateral lower extremity critical ischemia Requesting Provider:  Anabel Halon, MD  HPI: Jonathan [name] is a 77 y.o. (Jul 30, 1945) male presenting at the request of .Anabel Halon, MD bilateral lower extremity critical limb ischemia  Jonathan [name] is a patient of my colleague, Dr. Kirke Corin.  He recently underwent left lower extremity angiogram in an effort to define and improve perfusion or rest pain.  Angiogram demonstrated severe tibial disease with reconstitution of the dorsalis pedis and distal posterior tibial arteries.  Pedal access, retrograde approach was attempted, but failed.  I was asked to see him to assess his candidacy for open bypass surgery.  On exam, Jonathan [name] was doing well, accompanied by his wife.  Originally from [place], they now reside in [place].  Jonathan [name] has a longstanding history of type 1 diabetes. He notes bilateral lower extremity rest pain at night with left-sided tissue loss between the fourth and fifth digits in the webspace.  This has been present for roughly 1 month and has not worsened, but has not healed either.  His main complaint today is bilateral lower extremity swelling, which has been present over the last 3 weeks.  Both he and his wife feel he has never had lower extremity swelling of this magnitude.  He is also appreciated some breathing difficulties, noting he used to easily climb 1 flight of stairs without issue.  Now he is winded by the time he reaches the top.  The pt is  on a statin for cholesterol management.  The pt is  on a daily aspirin.   Other AC:  - The pt is  on medication for hypertension.   The pt is  diabetic.  Tobacco hx:  -  Past Medical History:  Diagnosis Date   Arthritis    ra, sees dr Alean Rinne   Chronic kidney disease    Coronary artery disease    saw dr Armanda Magic in [place] 20 yrs ago, released from cardiology   Diabetes mellitus without complication [facility])    type 2   Dyspnea 07/2022    Lately r/t CAD   GERD (gastroesophageal reflux disease)    Hypercholesteremia    Hypertension    Peripheral vascular disease (HCC)    Stroke (HCC) 2007   "Light Stroke" per pt. No deficits   Urethral stricture     Past Surgical History:  Procedure Laterality Date   ABDOMINAL AORTOGRAM W/LOWER EXTREMITY N/A 04/20/2022   Procedure: ABDOMINAL AORTOGRAM W/LOWER EXTREMITY;  Surgeon: Iran Ouch, MD;  Location: [facility];  Service: Cardiovascular;  Laterality: N/A;   BACK SURGERY     x 3 lower back   CARDIAC CATHETERIZATION  2000   small amt plaque relased from cardiology 20 yrs ago   CHOLECYSTECTOMY     COLONOSCOPY WITH PROPOFOL N/A 10/26/2021   Procedure: COLONOSCOPY WITH PROPOFOL;  Surgeon: Lanelle Bal, DO;  Location: [facility];  Service: Endoscopy;  Laterality: N/A;  To arrive at 1200   CORONARY ARTERY BYPASS GRAFT N/A 07/07/2022   Procedure: CORONARY ARTERY BYPASS GRAFTING (CABG) X5 USING OPEN LEFT INTERNAL MAMMARY ARTERY AND ENDOSCOPICALLY HARVESTED RIGHT GREATER SAPHENOUS VEIN;  Surgeon: Alleen Borne, MD;  Location: [facility] OR;  Service: Open Heart Surgery;  Laterality: N/A;   CYSTOSCOPY WITH URETHRAL DILATATION N/A 04/17/2019   Procedure: CYSTOSCOPY WITH URETHRAL DILATATION, CYSTOGRAM;  Surgeon: Rene Paci, MD;  Location: [facility];  Service: Urology;  Laterality:  N/A;   EYE SURGERY Bilateral    cataracts   IR RADIOLOGIST EVAL & MGMT  07/12/2021   PERIPHERAL VASCULAR BALLOON ANGIOPLASTY  04/20/2022   Procedure: PERIPHERAL VASCULAR BALLOON ANGIOPLASTY;  Surgeon: Iran Ouch, MD;  Location: [facility];  Service: Cardiovascular;;  aborted; unable to cross   POLYPECTOMY  10/26/2021   Procedure: POLYPECTOMY INTESTINAL;  Surgeon: Lanelle Bal, DO;  [facility]: [facility];  Service: Endoscopy;;   RIGHT/LEFT HEART CATH AND CORONARY ANGIOGRAPHY Bilateral 06/24/2022   Procedure: RIGHT/LEFT HEART CATH AND CORONARY  ANGIOGRAPHY;  Surgeon: Iran Ouch, MD;  Location: [facility];  Service: Cardiovascular;  Laterality: Bilateral;   TEE WITHOUT CARDIOVERSION N/A 07/07/2022   Procedure: TRANSESOPHAGEAL ECHOCARDIOGRAM;  Surgeon: Alleen Borne, MD;  Location[facility];  Service: Open Heart Surgery;  Laterality: N/A;   urethral stricture surgery  15 yrs ago    Social History   Socioeconomic History   Marital status: Married    Spouse name: Not on file   Number of children: Not on file   Years of education: Not on file   Highest education level: Not on file  Occupational History   Not on file  Tobacco Use   Smoking status: Never   Smokeless tobacco: Never  Vaping Use   Vaping Use: Never used  Substance and Sexual Activity   Alcohol use: No   Drug use: No   Sexual activity: Yes  Other Topics Concern   Not on file  Social History Narrative   Not on file   Social Determinants of Health   Financial Resource Strain: Low Risk  (11/15/2021)   Overall Financial Resource Strain (CARDIA)    Difficulty of Paying Living Expenses: Not hard at all  Food Insecurity: No Food Insecurity (07/11/2022)   Hunger Vital Sign    Worried About Running Out of Food in the Last Year: Never true    Ran Out of Food in the Last Year: Never true  Transportation Needs: No Transportation Needs (07/11/2022)   PRAPARE - Administrator, Civil Service (Medical): No    Lack of Transportation (Non-Medical): No  Physical Activity: Sufficiently Active (11/15/2021)   Exercise Vital Sign    Days of Exercise per Week: 5 days    Minutes of Exercise per Session: 30 min  Stress: No Stress Concern Present (11/15/2021)   [employer] of Occupational Health - Occupational Stress Questionnaire    Feeling of Stress : Not at all  Social Connections: Moderately Integrated (11/15/2021)   Social Connection and Isolation Panel [NHANES]    Frequency of Communication with Friends and Family: Three times a week     Frequency of Social Gatherings with Friends and Family: Twice a week    Attends Religious Services: More than 4 times per year    Active Member of [employer] or Organizations: No    Attends Banker Meetings: Never    Marital Status: Married  Catering manager Violence: Not At Risk (07/11/2022)   Humiliation, Afraid, Rape, and Kick questionnaire    Fear of Current or Ex-Partner: No    Emotionally Abused: No    Physically Abused: No    Sexually Abused: No   Family History  Problem Relation Age of Onset   Heart Problems Father     Current Outpatient Medications  Medication Sig Dispense Refill   hydrochlorothiazide (HYDRODIURIL) 25 MG tablet Take 1 tablet (25 mg total) by mouth daily. 90 tablet 1  acetaminophen (TYLENOL) 325 MG tablet Take 2 tablets (650 mg total) by mouth every 4 (four) hours as needed.     albuterol (VENTOLIN HFA) 108 (90 Base) MCG/ACT inhaler Inhale 1-2 puffs into the lungs every 6 (six) hours as needed for wheezing or shortness of breath.     aspirin EC 81 MG tablet Take 81 mg by mouth daily. Swallow whole.     cholecalciferol (VITAMIN D3) 25 MCG (1000 UNIT) tablet Take 1,000 Units by mouth daily.     cyanocobalamin (VITAMIN B12) 1000 MCG tablet Take 1,000 mcg by mouth daily.     finasteride (PROSCAR) 5 MG tablet Take 1 tablet (5 mg total) by mouth daily. 90 tablet 3   folic acid (FOLVITE) 1 MG tablet Take 1 mg by mouth daily.     gabapentin (NEURONTIN) 300 MG capsule Take 1 capsule (300 mg total) by mouth at bedtime. 30 capsule 5   glipiZIDE (GLUCOTROL) 5 MG tablet Take 1 tablet (5 mg total) by mouth 2 (two) times daily before a meal. 30 tablet 0   hydrocortisone (ANUSOL-HC) 2.5 % rectal cream Place 1 Application rectally 2 (two) times daily. For 10 days. May repeat cycle if needed. 30 g 1   levocetirizine (XYZAL) 5 MG tablet Take 1 tablet (5 mg total) by mouth every evening. 30 tablet 5   linaclotide (LINZESS) 145 MCG CAPS capsule Take 145 mcg by mouth daily  as needed (constipation).     Menthol, Topical Analgesic, (BLUE-EMU MAXIMUM STRENGTH EX) Apply 1 Application topically daily as needed (pain).     methotrexate (RHEUMATREX) 2.5 MG tablet Take 5 tablets (12.5 mg total) by mouth every Sunday. Resume on Sunday 07/24/22. Caution:Chemotherapy. Protect from light. 5 tablet 0   metoprolol tartrate (LOPRESSOR) 50 MG tablet TAKE 1 TABLET BY MOUTH TWICE  DAILY 200 tablet 2   omeprazole (PRILOSEC) 20 MG capsule Take 1 capsule (20 mg total) by mouth daily. 30 capsule 5   predniSONE (DELTASONE) 1 MG tablet Take 1 mg by mouth daily with breakfast.     rosuvastatin (CRESTOR) 10 MG tablet TAKE 1 TABLET BY MOUTH DAILY 90 tablet 3   silodosin (RAPAFLO) 8 MG CAPS capsule Take 1 capsule (8 mg total) by mouth in the morning and at bedtime. 180 capsule 3   triamcinolone cream (KENALOG) 0.1 % Apply 1 Application topically daily as needed (itching). 80 g 1   vitamin E 180 MG (400 UNITS) capsule Take 400 Units by mouth daily.     No current facility-administered medications for this visit.    Allergies  Allergen Reactions   Ciprofloxacin Itching   Mosquito (Diagnostic) Itching   Oxycodone-Acetaminophen Rash     REVIEW OF SYSTEMS:  [X]  denotes positive finding, [ ]  denotes negative finding Cardiac  Comments:  Chest pain or chest pressure:    Shortness of breath upon exertion:    Short of breath when lying flat:    Irregular heart rhythm:        Vascular    Pain in calf, thigh, or hip brought on by ambulation:    Pain in feet at night that wakes you up from your sleep:     Blood clot in your veins:    Leg swelling:         Pulmonary    Oxygen at home:    Productive cough:     Wheezing:         Neurologic    Sudden weakness in arms or legs:  Sudden numbness in arms or legs:     Sudden onset of difficulty speaking or slurred speech:    Temporary loss of vision in one eye:     Problems with dizziness:         Gastrointestinal    Blood in stool:      Vomited blood:         Genitourinary    Burning when urinating:     Blood in urine:        Psychiatric    Major depression:         Hematologic    Bleeding problems:    Problems with blood clotting too easily:        Skin    Rashes or ulcers:        Constitutional    Fever or chills:      PHYSICAL EXAMINATION:  There were no vitals filed for this visit.   General:  WDWN in NAD; vital signs documented above Gait: Not observed HENT: WNL, normocephalic Pulmonary: normal non-labored breathing , without wheezing Cardiac: regular HR Abdomen: soft, NT, no masses Skin: without rashes Vascular Exam/Pulses:  Right Left  Radial 2+ (normal) 2+ (normal)  Ulnar 2+ (normal) 2+ (normal)  Femoral    Popliteal    DP absent absent  PT absent absent   Extremities: with ischemic changes, without Gangrene , without cellulitis; with open wounds;  Musculoskeletal: no muscle wasting or atrophy  Neurologic: A&O X 3;  No focal weakness or paresthesias are detected Psychiatric:  The pt has Normal affect.   Non-Invasive Vascular Imaging:   See angiogram    ASSESSMENT/PLAN: Jonathan [name] is a 77 y.o. male presenting with left lower extremity critical limb ischemia with tissue loss, right lower extremity critical limb ischemia with rest pain.  I reviewed Jonathan [name]'s recent left lower extremity angiogram which demonstrated no named tibial vessels in the calf.  He has occlusion at the P3 segment of the popliteal artery with reconstitution in the dorsalis pedis and posterior tibial arteries.  Both of these plantar arteries are small.  Furthermore, I examined his vein mapping.  Veins in bilateral legs are borderline in regards to their candidacy to use his conduit.  I had a long discussion with Jonathan [name] and his wife.  Unfortunately, his options are few.  Jonathan [name] could continue with medical management and pursue left-sided above-knee amputation should the wound progress.  I also offered  high risk, below-knee popliteal artery to distal posterior tibial artery bypass with plans to use vein should the conduit looks suitable.  He is aware that this bypass has a high failure rate, especially if PTFE was necessary for the bypass.  I am also concerned that beyond the conduit, and poor outflow, he has poor inflow due to heart failure as demonstrated by his recent angiogram.  [facility] asked that everything be done in an effort to save his leg, therefore I am happy to attempt revascularization.  Prior to attending revascularization, I would appreciate Demarrion seeing Dr. Kary Kos in an effort to decrease the lower extremity edema he currently has and ensure he does not require cardiac catheterization.  I have attached Dr. Kary Kos to this note, and plan to call Marv immediately after he is evaluated from a cardiac standpoint to schedule surgery.   Jonathan Sparrow, MD [facility] [phone]

## 2022-09-02 ENCOUNTER — Ambulatory Visit: Payer: 59 | Admitting: Vascular Surgery

## 2022-09-07 ENCOUNTER — Encounter (HOSPITAL_COMMUNITY)
Admission: RE | Admit: 2022-09-07 | Discharge: 2022-09-07 | Disposition: A | Discharge status: Home / Self Care | Payer: 59 | Source: Ambulatory Visit | Attending: Cardiovascular Disease | Admitting: Cardiovascular Disease

## 2022-09-07 ENCOUNTER — Encounter (HOSPITAL_COMMUNITY): Payer: Self-pay

## 2022-09-07 VITALS — BP 140/88 | HR 76 | Ht 69.0 in

## 2022-09-07 DIAGNOSIS — Z951 Presence of aortocoronary bypass graft: Secondary | ICD-10-CM | POA: Insufficient documentation

## 2022-09-07 LAB — GLUCOSE, CAPILLARY
Glucose-Capillary: 51 mg/dL — ABNORMAL LOW (ref 70–99)
Glucose-Capillary: 86 mg/dL (ref 70–99)

## 2022-09-08 ENCOUNTER — Ambulatory Visit: Payer: 59 | Admitting: Podiatry

## 2022-09-13 LAB — LAB REPORT - SCANNED: EGFR: 46

## 2022-09-14 ENCOUNTER — Encounter (HOSPITAL_COMMUNITY)
Admission: RE | Admit: 2022-09-14 | Discharge: 2022-09-14 | Disposition: A | Discharge status: Home / Self Care | Payer: 59 | Source: Ambulatory Visit | Attending: Cardiovascular Disease | Admitting: Cardiovascular Disease

## 2022-09-14 VITALS — Ht 69.0 in | Wt 212.3 lb

## 2022-09-14 DIAGNOSIS — Z951 Presence of aortocoronary bypass graft: Secondary | ICD-10-CM

## 2022-09-14 LAB — GLUCOSE, CAPILLARY: Glucose-Capillary: 133 mg/dL — ABNORMAL HIGH (ref 70–99)

## 2022-09-14 NOTE — Progress Notes (Signed)
Cardiac Individual Treatment Plan  Patient Details  Name: Jonathan [name] MRN: [id number] Date of Birth: 1946-03-15 Referring Provider:   Flowsheet Row CARDIAC REHAB PHASE II EXERCISE from 09/14/2022 in [facility]  Referring Provider Bartle       Initial Encounter Date:  Flowsheet Row CARDIAC REHAB PHASE II EXERCISE from 09/14/2022 in [place] [facility]  Date 09/14/22       Visit Diagnosis: S/P CABG x 5  Patient's Home Medications on Admission:  Current Outpatient Medications:    hydrochlorothiazide (HYDRODIURIL) 25 MG tablet, Take 1 tablet (25 mg total) by mouth daily., Disp: 90 tablet, Rfl: 1   acetaminophen (TYLENOL) 325 MG tablet, Take 2 tablets (650 mg total) by mouth every 4 (four) hours as needed., Disp: , Rfl:    albuterol (VENTOLIN HFA) 108 (90 Base) MCG/ACT inhaler, Inhale 1-2 puffs into the lungs every 6 (six) hours as needed for wheezing or shortness of breath., Disp: , Rfl:    aspirin EC 81 MG tablet, Take 81 mg by mouth daily. Swallow whole., Disp: , Rfl:    cholecalciferol (VITAMIN D3) 25 MCG (1000 UNIT) tablet, Take 1,000 Units by mouth daily., Disp: , Rfl:    cyanocobalamin (VITAMIN B12) 1000 MCG tablet, Take 1,000 mcg by mouth daily., Disp: , Rfl:    finasteride (PROSCAR) 5 MG tablet, Take 1 tablet (5 mg total) by mouth daily., Disp: 90 tablet, Rfl: 3   folic acid (FOLVITE) 1 MG tablet, Take 1 mg by mouth daily., Disp: , Rfl:    gabapentin (NEURONTIN) 300 MG capsule, Take 1 capsule (300 mg total) by mouth at bedtime., Disp: 30 capsule, Rfl: 5   glipiZIDE (GLUCOTROL) 5 MG tablet, Take 1 tablet (5 mg total) by mouth 2 (two) times daily before a meal., Disp: 30 tablet, Rfl: 0   hydrocortisone (ANUSOL-HC) 2.5 % rectal cream, Place 1 Application rectally 2 (two) times daily. For 10 days. May repeat cycle if needed., Disp: 30 g, Rfl: 1   levocetirizine (XYZAL) 5 MG tablet, Take 1 tablet (5 mg total) by mouth every evening., Disp: 30  tablet, Rfl: 5   linaclotide (LINZESS) 145 MCG CAPS capsule, Take 145 mcg by mouth daily as needed (constipation)., Disp: , Rfl:    Menthol, Topical Analgesic, (BLUE-EMU MAXIMUM STRENGTH EX), Apply 1 Application topically daily as needed (pain)., Disp: , Rfl:    methotrexate (RHEUMATREX) 2.5 MG tablet, Take 5 tablets (12.5 mg total) by mouth every Sunday. Resume on Sunday 07/24/22. Caution:Chemotherapy. Protect from light., Disp: 5 tablet, Rfl: 0   metoprolol tartrate (LOPRESSOR) 50 MG tablet, TAKE 1 TABLET BY MOUTH TWICE  DAILY, Disp: 200 tablet, Rfl: 2   omeprazole (PRILOSEC) 20 MG capsule, Take 1 capsule (20 mg total) by mouth daily., Disp: 30 capsule, Rfl: 5   predniSONE (DELTASONE) 1 MG tablet, Take 1 mg by mouth daily with breakfast., Disp: , Rfl:    rosuvastatin (CRESTOR) 10 MG tablet, TAKE 1 TABLET BY MOUTH DAILY, Disp: 90 tablet, Rfl: 3   silodosin (RAPAFLO) 8 MG CAPS capsule, Take 1 capsule (8 mg total) by mouth in the morning and at bedtime., Disp: 180 capsule, Rfl: 3   triamcinolone cream (KENALOG) 0.1 %, Apply 1 Application topically daily as needed (itching)., Disp: 80 g, Rfl: 1   vitamin E 180 MG (400 UNITS) capsule, Take 400 Units by mouth daily., Disp: , Rfl:   Past Medical History: Past Medical History:  Diagnosis Date   Arthritis    ra, sees  dr Alean Rinne   Chronic kidney disease    Coronary artery disease    saw dr zachery in [place] 20 yrs ago, released from cardiology   Diabetes mellitus without complication [facility])    type 2   Dyspnea 07/2022   Lately r/t CAD   GERD (gastroesophageal reflux disease)    Hypercholesteremia    Hypertension    Peripheral vascular disease (HCC)    Stroke (HCC) 2007   "Light Stroke" per pt. No deficits   Urethral stricture     Tobacco Use: Social History   Tobacco Use  Smoking Status Never  Smokeless Tobacco Never    Labs: Review Flowsheet  More data exists      Latest Ref Rng & Units 06/24/2022 07/05/2022 07/07/2022  07/08/2022 07/21/2022  Labs for [facility]P Cardiac and Pulmonary Rehab  Cholestrol 100 - 199 mg/dL - - - - 782   LDL (calc) 0 - 99 mg/dL - - - - 59   HDL-C >95 mg/dL - - - - 41   Trlycerides 0 - 149 mg/dL - - - - 91   Hemoglobin A1c 4.8 - 5.6 % - 6.1  - - -  PH, Arterial 7.35 - 7.45 7.377  - 7.445  7.375  7.363  7.409  7.397  7.405  -  PCO2 arterial 32 - 48 mmHg 39.1  - 31.5  32.6  36.7  33.1  39.0  34.2  -  Bicarbonate 20.0 - 28.0 mmol/L 24.4  22.9  - 21.8  19.2  20.9  20.0  20.9  24.0  21.6  -  TCO2 22 - 32 mmol/L 26  24  - 23  20  24  22  25  26  21  22  24  26  25  23   -  Acid-base deficit 0.0 - 2.0 mmol/L 1.0  2.0  - 2.0  5.0  4.0  5.0  3.0  1.0  3.0  -  O2 Saturation % 71  95  - 100  99  100  87  100  100  96  -    Capillary Blood Glucose: Lab Results  Component Value Date   GLUCAP 133 (H) 09/14/2022   GLUCAP 86 09/07/2022   GLUCAP 51 (L) 09/07/2022   GLUCAP 101 (H) 07/12/2022   GLUCAP 116 (H) 07/11/2022     Exercise Target Goals: Exercise Program Goal: Individual exercise prescription set using results from initial 6 min walk test and THRR while considering  patient's activity barriers and safety.   Exercise Prescription Goal: Starting with aerobic activity 30 plus minutes a day, 3 days per week for initial exercise prescription. Provide home exercise prescription and guidelines that participant acknowledges understanding prior to discharge.  Activity Barriers & Risk Stratification:  Activity Barriers & Cardiac Risk Stratification - 09/07/22 1402       Activity Barriers & Cardiac Risk Stratification   Activity Barriers Arthritis;Back Problems;Balance Concerns    Cardiac Risk Stratification High             6 Minute Walk:  6 Minute Walk     Row Name 09/14/22 1033         6 Minute Walk   Phase Initial     Distance 900 feet     Walk Time 6 minutes     # of Rest Breaks 0     MPH 1.7     METS 1.87     RPE 12     VO2  Peak 6.54     Symptoms No     Resting HR 76  bpm     Resting BP 124/80     Resting Oxygen Saturation  98 %     Exercise Oxygen Saturation  during 6 min walk 99 %     Max Ex. HR 102 bpm     Max Ex. BP 140/80     2 Minute Post BP 128/80              Oxygen Initial Assessment:   Oxygen Re-Evaluation:   Oxygen Discharge (Final Oxygen Re-Evaluation):   Initial Exercise Prescription:  Initial Exercise Prescription - 09/14/22 1000       Date of Initial Exercise RX and Referring Provider   Date 09/14/22    Referring Provider Bartle    Expected Discharge Date 12/02/22      NuStep   Level 1    SPM 60    Minutes 22      Arm Ergometer   Level 1    RPM 45    Minutes 17      Prescription Details   Frequency (times per week) 3    Duration Progress to 30 minutes of continuous aerobic without signs/symptoms of physical distress      Intensity   THRR 40-80% of Max Heartrate 58-116    Ratings of Perceived Exertion 11-13      Resistance Training   Training Prescription Yes    Weight 3    Reps 10-15             Perform Capillary Blood Glucose checks as needed.  Exercise Prescription Changes:   Exercise Comments:   Exercise Goals and Review:   Exercise Goals     Row Name 09/14/22 1037             Exercise Goals   Increase Physical Activity Yes       Intervention Provide advice, education, support and counseling about physical activity/exercise needs.;Develop an individualized exercise prescription for aerobic and resistive training based on initial evaluation findings, risk stratification, comorbidities and participant's personal goals.       Expected Outcomes Short Term: Attend rehab on a regular basis to increase amount of physical activity.;Long Term: Add in home exercise to make exercise part of routine and to increase amount of physical activity.;Long Term: Exercising regularly at least 3-5 days a week.       Increase Strength and Stamina Yes       Intervention Provide advice, education,  support and counseling about physical activity/exercise needs.;Develop an individualized exercise prescription for aerobic and resistive training based on initial evaluation findings, risk stratification, comorbidities and participant's personal goals.       Expected Outcomes Short Term: Increase workloads from initial exercise prescription for resistance, speed, and METs.;Short Term: Perform resistance training exercises routinely during rehab and add in resistance training at home;Long Term: Improve cardiorespiratory fitness, muscular endurance and strength as measured by increased METs and functional capacity ( )       Able to understand and use rate of perceived exertion (RPE) scale Yes       Intervention Provide education and explanation on how to use RPE scale       Expected Outcomes Short Term: Able to use RPE daily in rehab to express subjective intensity level;Long Term:  Able to use RPE to guide intensity level when exercising independently       Knowledge and understanding of Target Heart Rate Range (THRR) Yes  Intervention Provide education and explanation of THRR including how the numbers were predicted and where they are located for reference       Expected Outcomes Short Term: Able to state/look up THRR;Long Term: Able to use THRR to govern intensity when exercising independently;Short Term: Able to use daily as guideline for intensity in rehab       Able to check pulse independently Yes       Intervention Provide education and demonstration on how to check pulse in carotid and radial arteries.;Review the importance of being able to check your own pulse for safety during independent exercise       Expected Outcomes Short Term: Able to explain why pulse checking is important during independent exercise;Long Term: Able to check pulse independently and accurately       Understanding of Exercise Prescription Yes       Intervention Provide education, explanation, and written materials  on patient's individual exercise prescription       Expected Outcomes Short Term: Able to explain program exercise prescription;Long Term: Able to explain home exercise prescription to exercise independently                Exercise Goals Re-Evaluation :    Discharge Exercise Prescription (Final Exercise Prescription Changes):   Nutrition:  Target Goals: Understanding of nutrition guidelines, daily intake of sodium 1500mg , cholesterol 200mg , calories 30% from fat and 7% or less from saturated fats, daily to have 5 or more servings of fruits and vegetables.  Biometrics:  Pre Biometrics - 09/14/22 1037       Pre Biometrics   Height 5\' 9"  (1.753 m)    Weight 96.3 kg    Waist Circumference 41 inches    Hip Circumference 48 inches    Waist to Hip Ratio 0.85 %    BMI (Calculated) 31.34    Triceps Skinfold 15 mm    % Body Fat 29.4 %    Grip Strength 22.6 kg    Flexibility 0 in    Single Leg Stand 0 seconds              Nutrition Therapy Plan and Nutrition Goals:  Nutrition Therapy & Goals - 09/07/22 1442       Personal Nutrition Goals   Comments Patient scored 28 on his diet assessment. Handouts explained and provided on healthier choices and DM. We provide educational sessions on heart healthy nutrition with handouts and assistance with RD referral if patient is interested.      Intervention Plan   Intervention Nutrition handout(s) given to patient.    Expected Outcomes Short Term Goal: Understand basic principles of dietary content, such as calories, fat, sodium, cholesterol and nutrients.             Nutrition Assessments:  Nutrition Assessments - 09/07/22 1442       Rate Your Plate Scores   Pre Score 28            MEDIFICTS Score Key: ?70 Need to make dietary changes  40-70 Heart Healthy Diet ? 40 Therapeutic Level Cholesterol Diet   Picture Your Plate Scores: <13 Unhealthy dietary pattern with much room for improvement. 41-50 Dietary  pattern unlikely to meet recommendations for good health and room for improvement. 51-60 More healthful dietary pattern, with some room for improvement.  >60 Healthy dietary pattern, although there may be some specific behaviors that could be improved.    Nutrition Goals Re-Evaluation:   Nutrition Goals Discharge (Final Nutrition Goals Re-Evaluation):  Psychosocial: Target Goals: Acknowledge presence or absence of significant depression and/or stress, maximize coping skills, provide positive support system. Participant is able to verbalize types and ability to use techniques and skills needed for reducing stress and depression.  Initial Review & Psychosocial Screening:  Initial Psych Review & Screening - 09/07/22 1446       Initial Review   Current issues with None Identified      Family Dynamics   Good Support System? Yes      Barriers   Psychosocial barriers to participate in program There are no identifiable barriers or psychosocial needs.      Screening Interventions   Interventions Encouraged to exercise;To provide support and resources with identified psychosocial needs    Expected Outcomes Long Term goal: The participant improves quality of Life and PHQ9 Scores as seen by post scores and/or verbalization of changes;Short Term goal: Identification and review with participant of any Quality of Life or Depression concerns found by scoring the questionnaire.             Quality of Life Scores:  Quality of Life - 09/07/22 1419       Quality of Life   Select Quality of Life      Quality of Life Scores   Health/Function Pre 22.39 %    Socioeconomic Pre 24.71 %    Psych/Spiritual Pre 30 %    Family Pre 24.5 %    GLOBAL Pre 24.82 %            Scores of 19 and below usually indicate a poorer quality of life in these areas.  A difference of  2-3 points is a clinically meaningful difference.  A difference of 2-3 points in the total score of the Quality of Life  Index has been associated with significant improvement in overall quality of life, self-image, physical symptoms, and general health in studies assessing change in quality of life.  PHQ-9: Review Flowsheet  More data exists      09/07/2022 06/28/2022 03/28/2022 03/09/2022 03/04/2022  Depression screen PHQ 2/9  Decreased Interest 0 2 0 0 0  Down, Depressed, Hopeless 0 0 0 0 0  PHQ - 2 Score 0 2 0 0 0  Altered sleeping 1 1 - - -  Tired, decreased energy 0 1 - - -  Change in appetite 0 0 - - -  Feeling bad or failure about yourself  0 0 - - -  Trouble concentrating 0 0 - - -  Moving slowly or fidgety/restless 0 0 - - -  Suicidal thoughts 0 0 - - -  PHQ-9 Score 1 4 - - -  Difficult doing work/chores Somewhat difficult Somewhat difficult - - -   Interpretation of Total Score  Total Score Depression Severity:  1-4 = Minimal depression, 5-9 = Mild depression, 10-14 = Moderate depression, 15-19 = Moderately severe depression, 20-27 = Severe depression   Psychosocial Evaluation and Intervention:  Psychosocial Evaluation - 09/07/22 1446       Psychosocial Evaluation & Interventions   Interventions Relaxation education;Stress management education;Encouraged to exercise with the program and follow exercise prescription    Comments Patient has no psychosocial barriers or issues identified at his orientation visit. His PHQ-9 score was 1 due to waking up to go the bathroom at night and sometimes having trouble getting back to sleep. He has a good support system with his wife and 3 children and his church family. He is a retired [employer] architect and has lived  in [place] most of his life. He moved to [place] 7 years ago when he married. He is looking forward to the program hoping to get back to his normal activities.    Expected Outcomes Patient will continue to have no psychosocial barriers identified.    Continue Psychosocial Services  No Follow up required             Psychosocial  Re-Evaluation:   Psychosocial Discharge (Final Psychosocial Re-Evaluation):   Vocational Rehabilitation: Provide vocational rehab assistance to qualifying candidates.   Vocational Rehab Evaluation & Intervention:  Vocational Rehab - 09/07/22 1444       Initial Vocational Rehab Evaluation & Intervention   Assessment shows need for Vocational Rehabilitation No      Vocational Rehab Re-Evaulation   Comments Patient is retired and does not need vocational rehab.             Education: Education Goals: Education classes will be provided on a weekly basis, covering required topics. Participant will state understanding/return demonstration of topics presented.  Learning Barriers/Preferences:  Learning Barriers/Preferences - 09/07/22 1443       Learning Barriers/Preferences   Learning Barriers None    Learning Preferences Audio             Education Topics: Hypertension, Hypertension Reduction -Define heart disease and high blood pressure. Discus how high blood pressure affects the body and ways to reduce high blood pressure.   Exercise and Your Heart -Discuss why it is important to exercise, the FITT principles of exercise, normal and abnormal responses to exercise, and how to exercise safely.   Angina -Discuss definition of angina, causes of angina, treatment of angina, and how to decrease risk of having angina.   Cardiac Medications -Review what the following cardiac medications are used for, how they affect the body, and side effects that may occur when taking the medications.  Medications include Aspirin, Beta blockers, calcium channel blockers, ACE Inhibitors, angiotensin receptor blockers, diuretics, digoxin, and antihyperlipidemics.   Congestive Heart Failure -Discuss the definition of CHF, how to live with CHF, the signs and symptoms of CHF, and how keep track of weight and sodium intake.   Heart Disease and Intimacy -Discus the effect sexual activity  has on the heart, how changes occur during intimacy as we age, and safety during sexual activity.   Smoking Cessation / COPD -Discuss different methods to quit smoking, the health benefits of quitting smoking, and the definition of COPD.   Nutrition I: Fats -Discuss the types of cholesterol, what cholesterol does to the heart, and how cholesterol levels can be controlled.   Nutrition II: Labels -Discuss the different components of food labels and how to read food label   Heart Parts/Heart Disease and PAD -Discuss the anatomy of the heart, the pathway of blood circulation through the heart, and these are affected by heart disease.   Stress I: Signs and Symptoms -Discuss the causes of stress, how stress may lead to anxiety and depression, and ways to limit stress.   Stress II: Relaxation -Discuss different types of relaxation techniques to limit stress.   Warning Signs of Stroke / TIA -Discuss definition of a stroke, what the signs and symptoms are of a stroke, and how to identify when someone is having stroke.   Knowledge Questionnaire Score:  Knowledge Questionnaire Score - 09/07/22 1443       Knowledge Questionnaire Score   Pre Score 10/28  Core Components/Risk Factors/Patient Goals at Admission:  Personal Goals and Risk Factors at Admission - 09/07/22 1444       Core Components/Risk Factors/Patient Goals on Admission    Weight Management Obesity    Diabetes Yes    Intervention Provide education about signs/symptoms and action to take for hypo/hyperglycemia.;Provide education about proper nutrition, including hydration, and aerobic/resistive exercise prescription along with prescribed medications to achieve blood glucose in normal ranges: Fasting glucose 65-99 mg/dL    Expected Outcomes Short Term: Participant verbalizes understanding of the signs/symptoms and immediate care of hyper/hypoglycemia, proper foot care and importance of medication,  aerobic/resistive exercise and nutrition plan for blood glucose control.;Long Term: Attainment of HbA1C < 7%.    Hypertension Yes    Intervention Provide education on lifestyle modifcations including regular physical activity/exercise, weight management, moderate sodium restriction and increased consumption of fresh fruit, vegetables, and low fat dairy, alcohol moderation, and smoking cessation.;Monitor prescription use compliance.    Expected Outcomes Short Term: Continued assessment and intervention until BP is < 140/62mm HG in hypertensive participants. < 130/62mm HG in hypertensive participants with diabetes, heart failure or chronic kidney disease.;Long Term: Maintenance of blood pressure at goal levels.    Lipids Yes    Intervention Provide education and support for participant on nutrition & aerobic/resistive exercise along with prescribed medications to achieve LDL 70mg , HDL >40mg .    Expected Outcomes Short Term: Participant states understanding of desired cholesterol values and is compliant with medications prescribed. Participant is following exercise prescription and nutrition guidelines.    Personal Goal Other Yes    Personal Goal Patient wants to get back to the activities he was doing prior to his surgery.    Intervention Patient will attend CR 3 days/week with exercise and education.    Expected Outcomes Patient will complete the program meeting both personal and program goals.             Core Components/Risk Factors/Patient Goals Review:    Core Components/Risk Factors/Patient Goals at Discharge (Final Review):    ITP Comments:   Comments: Patient arrived for 1st visit/orientation/education at 0930. Patient was referred to CR by Evelene Croon, MD due to S/P CABGx5 (Z95.1). During orientation advised patient on arrival and appointment times what to wear, what to do before, during and after exercise. Reviewed attendance and class policy.  Pt is scheduled to return Cardiac  Rehab on 09/16/22 at 0915. Pt was advised to come to class 15 minutes before class starts.  Discussed RPE/Dpysnea scales. Patient participated in warm up stretches. Patient was able to complete 6 minute walk test.  Telemetry:NSR. Patient was measured for the equipment. Discussed equipment safety with patient. Took patient pre-anthropometric measurements. Patient finished visit at 1010.

## 2022-09-14 NOTE — Progress Notes (Signed)
Daily Session Note  Patient Details  Name: Jonathan [name] MRN: [id number] Date of Birth: Mar 12, 1946 Referring Provider:    Encounter Date: 09/14/2022  Check In:  Session Check In - 09/14/22 0930       Check-In   Supervising physician immediately available to respond to emergencies [facility] MD immediately available    Physician(s) Dr. Jenene Slicker    Location [facility]    Staff Present Ross Ludwig, BS, Exercise Physiologist;Wagner Tanzi Debbe Mounts, MS, ACSM-CEP    Virtual Visit No    Medication changes reported     No    Fall or balance concerns reported    Yes    Comments Patient has history of falls and reports poor balance.    Tobacco Cessation No Change    Warm-up and Cool-down Performed on first and last piece of equipment    Resistance Training Performed Yes    VAD Patient? No    PAD/SET Patient? No      Pain Assessment   Currently in Pain? No/denies    Pain Score 0-No pain    Multiple Pain Sites No             Capillary Blood Glucose: No results found for this or any previous visit (from the past 24 hour(s)).    Social History   Tobacco Use  Smoking Status Never  Smokeless Tobacco Never    Goals Met:  Independence with exercise equipment Exercise tolerated well No report of concerns or symptoms today Strength training completed today  Goals Unmet:  Not Applicable  Comments: checkout time is 1030   Dr. Dina Rich is Medical Director for [facility] Cardiac Re[facility]

## 2022-09-15 ENCOUNTER — Telehealth: Payer: Self-pay | Admitting: Internal Medicine

## 2022-09-15 ENCOUNTER — Other Ambulatory Visit: Payer: Self-pay

## 2022-09-15 MED ORDER — GLIPIZIDE 5 MG PO TABS: 5.0000 mg | ORAL_TABLET | Freq: Two times a day (BID) | ORAL | 0 refills | Status: DC

## 2022-09-15 NOTE — Telephone Encounter (Signed)
Refills sent to pharmacy. 

## 2022-09-15 NOTE — Telephone Encounter (Signed)
Patient called completely out of this medicine.  Prescription Request  09/15/2022  LOV: 07/21/2022  What is the name of the medication or equipment? glipiZIDE (GLUCOTROL) 5 MG tablet [[id number]]   Have you contacted your pharmacy to request a refill? Yes   Which pharmacy would you like this sent to?  Pharmacy  [facility] [address], [place] - [address] [place], [place] [address] Phone: [phone]  Fax: [phone]     Patient notified that their request is being sent to the clinical staff for review and that they should receive a response within 2 business days.   Please advise at Mobile [phone] (mobile)

## 2022-09-16 ENCOUNTER — Encounter (HOSPITAL_COMMUNITY)
Admission: RE | Admit: 2022-09-16 | Discharge: 2022-09-16 | Disposition: A | Discharge status: Home / Self Care | Payer: 59 | Source: Ambulatory Visit | Attending: Cardiovascular Disease | Admitting: Cardiovascular Disease

## 2022-09-16 DIAGNOSIS — Z951 Presence of aortocoronary bypass graft: Secondary | ICD-10-CM | POA: Diagnosis not present

## 2022-09-16 LAB — GLUCOSE, CAPILLARY: Glucose-Capillary: 177 mg/dL — ABNORMAL HIGH (ref 70–99)

## 2022-09-16 NOTE — Progress Notes (Signed)
Daily Session Note  Patient Details  Name: Jonathan [name] MRN: [id number] Date of Birth: 01/27/1946 Referring Provider:   Flowsheet Row CARDIAC REHAB PHASE II EXERCISE from 09/14/2022 in [facility] CARDIAC REHABILITATION  Referring Provider Bartle       Encounter Date: 09/16/2022  Check In:  Session Check In - 09/16/22 4098       Check-In   Supervising physician immediately available to respond to emergencies [facility] MD immediately available    Physician(s) Dr. Jenene Slicker    Location [facility]    Staff Present Rodena Medin, RN, Pleas Koch, RN, BSN;Dalton Fletcher MHA, MS, ACSM-CEP    Virtual Visit No    Medication changes reported     Yes    Comments Prednisone changed to QOD for 30 days.    Fall or balance concerns reported    Yes    Comments Patient has history of falls and reports poor balance.    Tobacco Cessation No Change    Warm-up and Cool-down Performed as group-led instruction    Resistance Training Performed Yes    VAD Patient? No    PAD/SET Patient? No      Pain Assessment   Currently in Pain? No/denies    Pain Score 0-No pain    Multiple Pain Sites No             Capillary Blood Glucose: Results for orders placed or performed during the hospital encounter of 09/16/22 (from the past 24 hour(s))  Glucose, capillary     Status: Abnormal   Collection Time: 09/16/22  9:20 AM  Result Value Ref Range   Glucose-Capillary 177 (H) 70 - 99 mg/dL      Social History   Tobacco Use  Smoking Status Never  Smokeless Tobacco Never    Goals Met:  Independence with exercise equipment Exercise tolerated well No report of concerns or symptoms today Strength training completed today  Goals Unmet:  Not Applicable  Comments: Check out 1030.   Dr. Dina Rich is Medical Director for [facility] Cardiac Rehab

## 2022-09-19 ENCOUNTER — Encounter (HOSPITAL_COMMUNITY)
Admission: RE | Admit: 2022-09-19 | Discharge: 2022-09-19 | Disposition: A | Discharge status: Home / Self Care | Payer: 59 | Source: Ambulatory Visit | Attending: Cardiovascular Disease | Admitting: Cardiovascular Disease

## 2022-09-19 VITALS — Wt 213.8 lb

## 2022-09-19 DIAGNOSIS — Z951 Presence of aortocoronary bypass graft: Secondary | ICD-10-CM | POA: Diagnosis not present

## 2022-09-19 NOTE — Progress Notes (Signed)
Daily Session Note  Patient Details  Name: Jonathan [name] MRN: [id number] Date of Birth: 07-Aug-1945 Referring Provider:   Flowsheet Row CARDIAC REHAB PHASE II EXERCISE from 09/14/2022 in [facility] CARDIAC REHABILITATION  Referring Provider Bartle       Encounter Date: 09/19/2022  Check In:  Session Check In - 09/19/22 0930       Check-In   Supervising physician immediately available to respond to emergencies [facility] MD immediately available    Physician(s) Dr Francesco Runner    Location [facility]    Staff Present Ross Ludwig, BS, Exercise Physiologist;Moani Weipert Daphine Deutscher, RN, BSN;Dalton Debbe Mounts, MS, ACSM-CEP    Virtual Visit No    Medication changes reported     No    Fall or balance concerns reported    Yes    Comments Patient has history of falls and reports poor balance.    Tobacco Cessation No Change    Warm-up and Cool-down Performed as group-led instruction    Resistance Training Performed Yes      Pain Assessment   Currently in Pain? No/denies    Pain Score 0-No pain             Capillary Blood Glucose: No results found for this or any previous visit (from the past 24 hour(s)).    Social History   Tobacco Use  Smoking Status Never  Smokeless Tobacco Never    Goals Met:  Independence with exercise equipment Exercise tolerated well No report of concerns or symptoms today Strength training completed today  Goals Unmet:  Not Applicable  Comments: Checkout at 1030.   Dr. Dina Rich is Medical Director for [facility] Cardiac Rehab

## 2022-09-21 ENCOUNTER — Encounter (HOSPITAL_COMMUNITY)
Admission: RE | Admit: 2022-09-21 | Discharge: 2022-09-21 | Disposition: A | Discharge status: Home / Self Care | Payer: 59 | Source: Ambulatory Visit | Attending: Cardiovascular Disease | Admitting: Cardiovascular Disease

## 2022-09-21 DIAGNOSIS — Z951 Presence of aortocoronary bypass graft: Secondary | ICD-10-CM | POA: Diagnosis not present

## 2022-09-21 NOTE — Progress Notes (Signed)
Daily Session Note  Patient Details  Name: Jonathan [name] MRN: [id number] Date of Birth: 1945-06-12 Referring Provider:   Flowsheet Row CARDIAC REHAB PHASE II EXERCISE from 09/14/2022 in [facility] CARDIAC REHABILITATION  Referring Provider Bartle       Encounter Date: 09/21/2022  Check In:  Session Check In - 09/21/22 0930       Check-In   Supervising physician immediately available to respond to emergencies [facility] MD immediately available    Physician(s) Tenny Craw    Location [facility]    Staff Present Ross Ludwig, BS, Exercise Physiologist;Dalton Debbe Mounts, MS, ACSM-CEP    Virtual Visit No    Medication changes reported     No    Fall or balance concerns reported    Yes    Comments Patient has history of falls and reports poor balance.    Tobacco Cessation No Change    Warm-up and Cool-down Performed as group-led instruction    Resistance Training Performed Yes    VAD Patient? No    PAD/SET Patient? No      Pain Assessment   Currently in Pain? No/denies    Pain Score 0-No pain    Multiple Pain Sites No             Capillary Blood Glucose: No results found for this or any previous visit (from the past 24 hour(s)).    Social History   Tobacco Use  Smoking Status Never  Smokeless Tobacco Never    Goals Met:  Independence with exercise equipment Exercise tolerated well No report of concerns or symptoms today Strength training completed today  Goals Unmet:  Not Applicable  Comments: check out 1030   Dr. Dina Rich is Medical Director for [facility] Cardiac Rehab

## 2022-09-21 NOTE — Progress Notes (Signed)
Cardiac Individual Treatment Plan  Patient Details  Name: Jonathan [name] MRN: [id number] Date of Birth: 10-26-1945 Referring Provider:   Flowsheet Row CARDIAC REHAB PHASE II EXERCISE from 09/14/2022 in [place] [facility]  Referring Provider Bartle       Initial Encounter Date:  Flowsheet Row CARDIAC REHAB PHASE II EXERCISE from 09/14/2022 in [place] [facility]AC [facility]  Date 09/14/22       Visit Diagnosis: S/P CABG x 5  Patient's Home Medications on Admission:  Current Outpatient Medications:    hydrochlorothiazide (HYDRODIURIL) 25 MG tablet, Take 1 tablet (25 mg total) by mouth daily., Disp: 90 tablet, Rfl: 1   acetaminophen (TYLENOL) 325 MG tablet, Take 2 tablets (650 mg total) by mouth every 4 (four) hours as needed., Disp: , Rfl:    albuterol (VENTOLIN HFA) 108 (90 Base) MCG/ACT inhaler, Inhale 1-2 puffs into the lungs every 6 (six) hours as needed for wheezing or shortness of breath., Disp: , Rfl:    aspirin EC 81 MG tablet, Take 81 mg by mouth daily. Swallow whole., Disp: , Rfl:    cholecalciferol (VITAMIN D3) 25 MCG (1000 UNIT) tablet, Take 1,000 Units by mouth daily., Disp: , Rfl:    cyanocobalamin (VITAMIN B12) 1000 MCG tablet, Take 1,000 mcg by mouth daily., Disp: , Rfl:    finasteride (PROSCAR) 5 MG tablet, Take 1 tablet (5 mg total) by mouth daily., Disp: 90 tablet, Rfl: 3   folic acid (FOLVITE) 1 MG tablet, Take 1 mg by mouth daily., Disp: , Rfl:    gabapentin (NEURONTIN) 300 MG capsule, Take 1 capsule (300 mg total) by mouth at bedtime., Disp: 30 capsule, Rfl: 5   glipiZIDE (GLUCOTROL) 5 MG tablet, Take 1 tablet (5 mg total) by mouth 2 (two) times daily before a meal., Disp: 30 tablet, Rfl: 0   hydrocortisone (ANUSOL-HC) 2.5 % rectal cream, Place 1 Application rectally 2 (two) times daily. For 10 days. May repeat cycle if needed., Disp: 30 g, Rfl: 1   levocetirizine (XYZAL) 5 MG tablet, Take 1 tablet (5 mg total) by mouth every evening., Disp: 30  tablet, Rfl: 5   linaclotide (LINZESS) 145 MCG CAPS capsule, Take 145 mcg by mouth daily as needed (constipation)., Disp: , Rfl:    Menthol, Topical Analgesic, (BLUE-EMU MAXIMUM STRENGTH EX), Apply 1 Application topically daily as needed (pain)., Disp: , Rfl:    methotrexate (RHEUMATREX) 2.5 MG tablet, Take 5 tablets (12.5 mg total) by mouth every Sunday. Resume on Sunday 07/24/22. Caution:Chemotherapy. Protect from light., Disp: 5 tablet, Rfl: 0   metoprolol tartrate (LOPRESSOR) 50 MG tablet, TAKE 1 TABLET BY MOUTH TWICE  DAILY, Disp: 200 tablet, Rfl: 2   omeprazole (PRILOSEC) 20 MG capsule, Take 1 capsule (20 mg total) by mouth daily., Disp: 30 capsule, Rfl: 5   predniSONE (DELTASONE) 1 MG tablet, Take 1 mg by mouth daily with breakfast., Disp: , Rfl:    rosuvastatin (CRESTOR) 10 MG tablet, TAKE 1 TABLET BY MOUTH DAILY, Disp: 90 tablet, Rfl: 3   silodosin (RAPAFLO) 8 MG CAPS capsule, Take 1 capsule (8 mg total) by mouth in the morning and at bedtime., Disp: 180 capsule, Rfl: 3   triamcinolone cream (KENALOG) 0.1 %, Apply 1 Application topically daily as needed (itching)., Disp: 80 g, Rfl: 1   vitamin E 180 MG (400 UNITS) capsule, Take 400 Units by mouth daily., Disp: , Rfl:   Past Medical History: Past Medical History:  Diagnosis Date   Arthritis    ra, sees  dr Alean Rinne   Chronic kidney disease    Coronary artery disease    saw dr zachery in [place] 20 yrs ago, released from cardiology   Diabetes mellitus without complication [facility])    type 2   Dyspnea 07/2022   Lately r/t CAD   GERD (gastroesophageal reflux disease)    Hypercholesteremia    Hypertension    Peripheral vascular disease (HCC)    Stroke (HCC) 2007   "Light Stroke" per pt. No deficits   Urethral stricture     Tobacco Use: Social History   Tobacco Use  Smoking Status Never  Smokeless Tobacco Never    Labs: Review Flowsheet  More data exists      Latest Ref Rng & Units 06/24/2022 07/05/2022 07/07/2022  07/08/2022 07/21/2022  Labs for ITP Cardiac and Pulmonary Rehab  Cholestrol 100 - 199 mg/dL - - - - 454   LDL (calc) 0 - 99 mg/dL - - - - 59   HDL-C >09 mg/dL - - - - 41   Trlycerides 0 - 149 mg/dL - - - - 91   Hemoglobin A1c 4.8 - 5.6 % - 6.1  - - -  PH, Arterial 7.35 - 7.45 7.377  - 7.445  7.375  7.363  7.409  7.397  7.405  -  PCO2 arterial 32 - 48 mmHg 39.1  - 31.5  32.6  36.7  33.1  39.0  34.2  -  Bicarbonate 20.0 - 28.0 mmol/L 24.4  22.9  - 21.8  19.2  20.9  20.0  20.9  24.0  21.6  -  TCO2 22 - 32 mmol/L 26  24  - 23  20  24  22  25  26  21  22  24  26  25  23   -  Acid-base deficit 0.0 - 2.0 mmol/L 1.0  2.0  - 2.0  5.0  4.0  5.0  3.0  1.0  3.0  -  O2 Saturation % 71  95  - 100  99  100  87  100  100  96  -    Capillary Blood Glucose: Lab Results  Component Value Date   GLUCAP 177 (H) 09/16/2022   GLUCAP 133 (H) 09/14/2022   GLUCAP 86 09/07/2022   GLUCAP 51 (L) 09/07/2022   GLUCAP 101 (H) 07/12/2022     Exercise Target Goals: Exercise Program Goal: Individual exercise prescription set using results from initial 6 min walk test and THRR while considering  patient's activity barriers and safety.   Exercise Prescription Goal: Starting with aerobic activity 30 plus minutes a day, 3 days per week for initial exercise prescription. Provide home exercise prescription and guidelines that participant acknowledges understanding prior to discharge.  Activity Barriers & Risk Stratification:  Activity Barriers & Cardiac Risk Stratification - 09/07/22 1402       Activity Barriers & Cardiac Risk Stratification   Activity Barriers Arthritis;Back Problems;Balance Concerns    Cardiac Risk Stratification High             6 Minute Walk:  6 Minute Walk     Row Name 09/14/22 1033         6 Minute Walk   Phase Initial     Distance 900 feet     Walk Time 6 minutes     # of Rest Breaks 0     MPH 1.7     METS 1.87     RPE 12     VO2  Peak 6.54     Symptoms No     Resting HR 76  bpm     Resting BP 124/80     Resting Oxygen Saturation  98 %     Exercise Oxygen Saturation  during 6 min walk 99 %     Max Ex. HR 102 bpm     Max Ex. BP 140/80     2 Minute Post BP 128/80              Oxygen Initial Assessment:   Oxygen Re-Evaluation:   Oxygen Discharge (Final Oxygen Re-Evaluation):   Initial Exercise Prescription:  Initial Exercise Prescription - 09/14/22 1000       Date of Initial Exercise RX and Referring Provider   Date 09/14/22    Referring Provider Bartle    Expected Discharge Date 12/02/22      NuStep   Level 1    SPM 60    Minutes 22      Arm Ergometer   Level 1    RPM 45    Minutes 17      Prescription Details   Frequency (times per week) 3    Duration Progress to 30 minutes of continuous aerobic without signs/symptoms of physical distress      Intensity   THRR 40-80% of Max Heartrate 58-116    Ratings of Perceived Exertion 11-13      Resistance Training   Training Prescription Yes    Weight 3    Reps 10-15             Perform Capillary Blood Glucose checks as needed.  Exercise Prescription Changes:   Exercise Prescription Changes     Row Name 09/19/22 1000             Response to Exercise   Blood Pressure (Admit) 124/80       Blood Pressure (Exercise) 136/82       Blood Pressure (Exit) 148/78       Heart Rate (Admit) 75 bpm       Heart Rate (Exercise) 81 bpm       Heart Rate (Exit) 78 bpm       Rating of Perceived Exertion (Exercise) 13       Duration Continue with 30 min of aerobic exercise without signs/symptoms of physical distress.       Intensity THRR unchanged         Progression   Progression Continue to progress workloads to maintain intensity without signs/symptoms of physical distress.         Resistance Training   Training Prescription Yes       Weight 3       Reps 10-15       Time 10 Minutes         NuStep   Level 2       SPM 96       Minutes 22       METs 2.15         Arm  Ergometer   Level 1       RPM 69       Minutes 17       METs 1.87                Exercise Comments:   Exercise Goals and Review:   Exercise Goals     Row Name 09/14/22 1037 09/20/22 0746           Exercise Goals  Increase Physical Activity Yes Yes      Intervention Provide advice, education, support and counseling about physical activity/exercise needs.;Develop an individualized exercise prescription for aerobic and resistive training based on initial evaluation findings, risk stratification, comorbidities and participant's personal goals. Provide advice, education, support and counseling about physical activity/exercise needs.;Develop an individualized exercise prescription for aerobic and resistive training based on initial evaluation findings, risk stratification, comorbidities and participant's personal goals.      Expected Outcomes Short Term: Attend rehab on a regular basis to increase amount of physical activity.;Long Term: Add in home exercise to make exercise part of routine and to increase amount of physical activity.;Long Term: Exercising regularly at least 3-5 days a week. Short Term: Attend rehab on a regular basis to increase amount of physical activity.;Long Term: Add in home exercise to make exercise part of routine and to increase amount of physical activity.;Long Term: Exercising regularly at least 3-5 days a week.      Increase Strength and Stamina Yes Yes      Intervention Provide advice, education, support and counseling about physical activity/exercise needs.;Develop an individualized exercise prescription for aerobic and resistive training based on initial evaluation findings, risk stratification, comorbidities and participant's personal goals. Provide advice, education, support and counseling about physical activity/exercise needs.;Develop an individualized exercise prescription for aerobic and resistive training based on initial evaluation findings, risk  stratification, comorbidities and participant's personal goals.      Expected Outcomes Short Term: Increase workloads from initial exercise prescription for resistance, speed, and METs.;Short Term: Perform resistance training exercises routinely during rehab and add in resistance training at home;Long Term: Improve cardiorespiratory fitness, muscular endurance and strength as measured by increased METs and functional capacity ( ) Short Term: Increase workloads from initial exercise prescription for resistance, speed, and METs.;Short Term: Perform resistance training exercises routinely during rehab and add in resistance training at home;Long Term: Improve cardiorespiratory fitness, muscular endurance and strength as measured by increased METs and functional capacity ( )      Able to understand and use rate of perceived exertion (RPE) scale Yes Yes      Intervention Provide education and explanation on how to use RPE scale Provide education and explanation on how to use RPE scale      Expected Outcomes Short Term: Able to use RPE daily in rehab to express subjective intensity level;Long Term:  Able to use RPE to guide intensity level when exercising independently Short Term: Able to use RPE daily in rehab to express subjective intensity level;Long Term:  Able to use RPE to guide intensity level when exercising independently      Knowledge and understanding of Target Heart Rate Range (THRR) Yes Yes      Intervention Provide education and explanation of THRR including how the numbers were predicted and where they are located for reference Provide education and explanation of THRR including how the numbers were predicted and where they are located for reference      Expected Outcomes Short Term: Able to state/look up THRR;Long Term: Able to use THRR to govern intensity when exercising independently;Short Term: Able to use daily as guideline for intensity in rehab Short Term: Able to state/look up THRR;Long  Term: Able to use THRR to govern intensity when exercising independently;Short Term: Able to use daily as guideline for intensity in rehab      Able to check pulse independently Yes Yes      Intervention Provide education and demonstration on how to check pulse in carotid and  radial arteries.;Review the importance of being able to check your own pulse for safety during independent exercise Provide education and demonstration on how to check pulse in carotid and radial arteries.;Review the importance of being able to check your own pulse for safety during independent exercise      Expected Outcomes Short Term: Able to explain why pulse checking is important during independent exercise;Long Term: Able to check pulse independently and accurately Short Term: Able to explain why pulse checking is important during independent exercise;Long Term: Able to check pulse independently and accurately      Understanding of Exercise Prescription Yes Yes      Intervention Provide education, explanation, and written materials on patient's individual exercise prescription Provide education, explanation, and written materials on patient's individual exercise prescription      Expected Outcomes Short Term: Able to explain program exercise prescription;Long Term: Able to explain home exercise prescription to exercise independently Short Term: Able to explain program exercise prescription;Long Term: Able to explain home exercise prescription to exercise independently               Exercise Goals Re-Evaluation :  Exercise Goals Re-Evaluation     Row Name 09/20/22 0746             Exercise Goal Re-Evaluation   Exercise Goals Review Increase Physical Activity;Increase Strength and Stamina;Able to understand and use rate of perceived exertion (RPE) scale;Knowledge and understanding of Target Heart Rate Range (THRR);Able to check pulse independently;Understanding of Exercise Prescription       Comments Pt has  completed 3 sessions of cardiac rehab. He is egar to be in the program and is ready to work. He has shown up late the past two sessions to not be included in the full warmup. He is slighly deconditioned but has show progress in the number of SPM/RPM each class. He is currently exercising at 2.15 METs on the stepper. Will continue to monitor and progress as able.       Expected Outcomes Through exercise at rehab and home, the patient will meet their stated goals.                 Discharge Exercise Prescription (Final Exercise Prescription Changes):  Exercise Prescription Changes - 09/19/22 1000       Response to Exercise   Blood Pressure (Admit) 124/80    Blood Pressure (Exercise) 136/82    Blood Pressure (Exit) 148/78    Heart Rate (Admit) 75 bpm    Heart Rate (Exercise) 81 bpm    Heart Rate (Exit) 78 bpm    Rating of Perceived Exertion (Exercise) 13    Duration Continue with 30 min of aerobic exercise without signs/symptoms of physical distress.    Intensity THRR unchanged      Progression   Progression Continue to progress workloads to maintain intensity without signs/symptoms of physical distress.      Resistance Training   Training Prescription Yes    Weight 3    Reps 10-15    Time 10 Minutes      NuStep   Level 2    SPM 96    Minutes 22    METs 2.15      Arm Ergometer   Level 1    RPM 69    Minutes 17    METs 1.87             Nutrition:  Target Goals: Understanding of nutrition guidelines, daily intake of sodium 1500mg , cholesterol 200mg , calories  30% from fat and 7% or less from saturated fats, daily to have 5 or more servings of fruits and vegetables.  Biometrics:  Pre Biometrics - 09/14/22 1037       Pre Biometrics   Height 5\' 9"  (1.753 m)    Weight 96.3 kg    Waist Circumference 41 inches    Hip Circumference 48 inches    Waist to Hip Ratio 0.85 %    BMI (Calculated) 31.34    Triceps Skinfold 15 mm    % Body Fat 29.4 %    Grip Strength  22.6 kg    Flexibility 0 in    Single Leg Stand 0 seconds              Nutrition Therapy Plan and Nutrition Goals:  Nutrition Therapy & Goals - 09/19/22 0833       Nutrition Therapy   RD appointment deferred Yes      Personal Nutrition Goals   Additional Goals? No    Comments We provide educational sessions on heart healthy nutrition with handouts and assistance with RD referral if patient is interested.      Intervention Plan   Intervention Nutrition handout(s) given to patient.    Expected Outcomes Short Term Goal: Understand basic principles of dietary content, such as calories, fat, sodium, cholesterol and nutrients.             Nutrition Assessments:  Nutrition Assessments - 09/07/22 1442       Rate Your Plate Scores   Pre Score 28            MEDIFICTS Score Key: ?70 Need to make dietary changes  40-70 Heart Healthy Diet ? 40 Therapeutic Level Cholesterol Diet   Picture Your Plate Scores: <16 Unhealthy dietary pattern with much room for improvement. 41-50 Dietary pattern unlikely to meet recommendations for good health and room for improvement. 51-60 More healthful dietary pattern, with some room for improvement.  >60 Healthy dietary pattern, although there may be some specific behaviors that could be improved.    Nutrition Goals Re-Evaluation:   Nutrition Goals Discharge (Final Nutrition Goals Re-Evaluation):   Psychosocial: Target Goals: Acknowledge presence or absence of significant depression and/or stress, maximize coping skills, provide positive support system. Participant is able to verbalize types and ability to use techniques and skills needed for reducing stress and depression.  Initial Review & Psychosocial Screening:  Initial Psych Review & Screening - 09/07/22 1446       Initial Review   Current issues with None Identified      Family Dynamics   Good Support System? Yes      Barriers   Psychosocial barriers to participate  in program There are no identifiable barriers or psychosocial needs.      Screening Interventions   Interventions Encouraged to exercise;To provide support and resources with identified psychosocial needs    Expected Outcomes Long Term goal: The participant improves quality of Life and PHQ9 Scores as seen by post scores and/or verbalization of changes;Short Term goal: Identification and review with participant of any Quality of Life or Depression concerns found by scoring the questionnaire.             Quality of Life Scores:  Quality of Life - 09/07/22 1419       Quality of Life   Select Quality of Life      Quality of Life Scores   Health/Function Pre 22.39 %    Socioeconomic Pre 24.71 %  Psych/Spiritual Pre 30 %    Family Pre 24.5 %    GLOBAL Pre 24.82 %            Scores of 19 and below usually indicate a poorer quality of life in these areas.  A difference of  2-3 points is a clinically meaningful difference.  A difference of 2-3 points in the total score of the Quality of Life Index has been associated with significant improvement in overall quality of life, self-image, physical symptoms, and general health in studies assessing change in quality of life.  PHQ-9: Review Flowsheet  More data exists      09/07/2022 06/28/2022 03/28/2022 03/09/2022 03/04/2022  Depression screen PHQ 2/9  Decreased Interest 0 2 0 0 0  Down, Depressed, Hopeless 0 0 0 0 0  PHQ - 2 Score 0 2 0 0 0  Altered sleeping 1 1 - - -  Tired, decreased energy 0 1 - - -  Change in appetite 0 0 - - -  Feeling bad or failure about yourself  0 0 - - -  Trouble concentrating 0 0 - - -  Moving slowly or fidgety/restless 0 0 - - -  Suicidal thoughts 0 0 - - -  PHQ-9 Score 1 4 - - -  Difficult doing work/chores Somewhat difficult Somewhat difficult - - -   Interpretation of Total Score  Total Score Depression Severity:  1-4 = Minimal depression, 5-9 = Mild depression, 10-14 = Moderate depression, 15-19  = Moderately severe depression, 20-27 = Severe depression   Psychosocial Evaluation and Intervention:  Psychosocial Evaluation - 09/07/22 1446       Psychosocial Evaluation & Interventions   Interventions Relaxation education;Stress management education;Encouraged to exercise with the program and follow exercise prescription    Comments Patient has no psychosocial barriers or issues identified at his orientation visit. His PHQ-9 score was 1 due to waking up to go the bathroom at night and sometimes having trouble getting back to sleep. He has a good support system with his wife and 3 children and his church family. He is a retired [employer] and has lived in [place] most of his life. He moved to [place] 7 years ago when he married. He is looking forward to the program hoping to get back to his normal activities.    Expected Outcomes Patient will continue to have no psychosocial barriers identified.    Continue Psychosocial Services  No Follow up required             Psychosocial Re-Evaluation:  Psychosocial Re-Evaluation     Row Name 09/19/22 [id number]             Psychosocial Re-Evaluation   Current issues with None Identified       Comments Patient is new to the program. He has completed 3 sessions. He continues to have  no psychosocial barriers or issues identified. We will continue to monitor his progress.       Expected Outcomes Patient will continue to have no psychosocial barriers identified.       Interventions Relaxation education;Encouraged to attend Cardiac Rehabilitation for the exercise;Stress management education       Continue Psychosocial Services  No Follow up required                Psychosocial Discharge (Final Psychosocial Re-Evaluation):  Psychosocial Re-Evaluation - 09/19/22 0816       Psychosocial Re-Evaluation   Current issues with None Identified    Comments Patient  is new to the program. He has completed 3 sessions. He continues to have  no  psychosocial barriers or issues identified. We will continue to monitor his progress.    Expected Outcomes Patient will continue to have no psychosocial barriers identified.    Interventions Relaxation education;Encouraged to attend Cardiac Rehabilitation for the exercise;Stress management education    Continue Psychosocial Services  No Follow up required             Vocational Rehabilitation: Provide vocational rehab assistance to qualifying candidates.   Vocational Rehab Evaluation & Intervention:  Vocational Rehab - 09/07/22 1444       Initial Vocational Rehab Evaluation & Intervention   Assessment shows need for Vocational Rehabilitation No      Vocational Rehab Re-Evaulation   Comments Patient is retired and does not need vocational rehab.             Education: Education Goals: Education classes will be provided on a weekly basis, covering required topics. Participant will state understanding/return demonstration of topics presented.  Learning Barriers/Preferences:  Learning Barriers/Preferences - 09/07/22 1443       Learning Barriers/Preferences   Learning Barriers None    Learning Preferences Audio             Education Topics: Hypertension, Hypertension Reduction -Define heart disease and high blood pressure. Discus how high blood pressure affects the body and ways to reduce high blood pressure.   Exercise and Your Heart -Discuss why it is important to exercise, the FITT principles of exercise, normal and abnormal responses to exercise, and how to exercise safely.   Angina -Discuss definition of angina, causes of angina, treatment of angina, and how to decrease risk of having angina.   Cardiac Medications -Review what the following cardiac medications are used for, how they affect the body, and side effects that may occur when taking the medications.  Medications include Aspirin, Beta blockers, calcium channel blockers, ACE Inhibitors, angiotensin  receptor blockers, diuretics, digoxin, and antihyperlipidemics.   Congestive Heart Failure -Discuss the definition of CHF, how to live with CHF, the signs and symptoms of CHF, and how keep track of weight and sodium intake.   Heart Disease and Intimacy -Discus the effect sexual activity has on the heart, how changes occur during intimacy as we age, and safety during sexual activity.   Smoking Cessation / COPD -Discuss different methods to quit smoking, the health benefits of quitting smoking, and the definition of COPD.   Nutrition I: Fats -Discuss the types of cholesterol, what cholesterol does to the heart, and how cholesterol levels can be controlled.   Nutrition II: Labels -Discuss the different components of food labels and how to read food label   Heart Parts/Heart Disease and PAD -Discuss the anatomy of the heart, the pathway of blood circulation through the heart, and these are affected by heart disease.   Stress I: Signs and Symptoms -Discuss the causes of stress, how stress may lead to anxiety and depression, and ways to limit stress.   Stress II: Relaxation -Discuss different types of relaxation techniques to limit stress.   Warning Signs of Stroke / TIA -Discuss definition of a stroke, what the signs and symptoms are of a stroke, and how to identify when someone is having stroke.   Knowledge Questionnaire Score:  Knowledge Questionnaire Score - 09/07/22 1443       Knowledge Questionnaire Score   Pre Score 10/28  Core Components/Risk Factors/Patient Goals at Admission:  Personal Goals and Risk Factors at Admission - 09/07/22 1444       Core Components/Risk Factors/Patient Goals on Admission    Weight Management Obesity    Diabetes Yes    Intervention Provide education about signs/symptoms and action to take for hypo/hyperglycemia.;Provide education about proper nutrition, including hydration, and aerobic/resistive exercise prescription  along with prescribed medications to achieve blood glucose in normal ranges: Fasting glucose 65-99 mg/dL    Expected Outcomes Short Term: Participant verbalizes understanding of the signs/symptoms and immediate care of hyper/hypoglycemia, proper foot care and importance of medication, aerobic/resistive exercise and nutrition plan for blood glucose control.;Long Term: Attainment of HbA1C < 7%.    Hypertension Yes    Intervention Provide education on lifestyle modifcations including regular physical activity/exercise, weight management, moderate sodium restriction and increased consumption of fresh fruit, vegetables, and low fat dairy, alcohol moderation, and smoking cessation.;Monitor prescription use compliance.    Expected Outcomes Short Term: Continued assessment and intervention until BP is < 140/79mm HG in hypertensive participants. < 130/66mm HG in hypertensive participants with diabetes, heart failure or chronic kidney disease.;Long Term: Maintenance of blood pressure at goal levels.    Lipids Yes    Intervention Provide education and support for participant on nutrition & aerobic/resistive exercise along with prescribed medications to achieve LDL 70mg , HDL >40mg .    Expected Outcomes Short Term: Participant states understanding of desired cholesterol values and is compliant with medications prescribed. Participant is following exercise prescription and nutrition guidelines.    Personal Goal Other Yes    Personal Goal Patient wants to get back to the activities he was doing prior to his surgery.    Intervention Patient will attend CR 3 days/week with exercise and education.    Expected Outcomes Patient will complete the program meeting both personal and program goals.             Core Components/Risk Factors/Patient Goals Review:   Goals and Risk Factor Review     Row Name 09/19/22 0818             Core Components/Risk Factors/Patient Goals Review   Personal Goals Review Weight  Management/Obesity;Lipids;Diabetes;Hypertension;Other       Review Patient was referred to CR with CABGx5. He has multiple risk factors for CAD and is participating in the program for risk modification. He has completed 3 sessions and is doing well in the program. His DM is manged with Glipizide. His last A1C was 07/05/22 at 6.1%. His personal goals for the program are to get back to his activities he was doing prior ot his surgery. We will continue to monitor his progress as he works towards meeting these goals.       Expected Outcomes Patient will complete the program meeting both personal and program goals.                Core Components/Risk Factors/Patient Goals at Discharge (Final Review):   Goals and Risk Factor Review - 09/19/22 0818       Core Components/Risk Factors/Patient Goals Review   Personal Goals Review Weight Management/Obesity;Lipids;Diabetes;Hypertension;Other    Review Patient was referred to CR with CABGx5. He has multiple risk factors for CAD and is participating in the program for risk modification. He has completed 3 sessions and is doing well in the program. His DM is manged with Glipizide. His last A1C was 07/05/22 at 6.1%. His personal goals for the program are to get back to his activities  he was doing prior ot his surgery. We will continue to monitor his progress as he works towards meeting these goals.    Expected Outcomes Patient will complete the program meeting both personal and program goals.             ITP Comments:   Comments: ITP REVIEW Pt is making expected progress toward Cardiac Rehab goals after completing 4 sessions. Recommend continued exercise, life style modification, education, and increased stamina and strength.

## 2022-09-23 ENCOUNTER — Encounter (HOSPITAL_COMMUNITY)
Admission: RE | Admit: 2022-09-23 | Discharge: 2022-09-23 | Disposition: A | Discharge status: Home / Self Care | Payer: 59 | Source: Ambulatory Visit | Attending: Cardiovascular Disease | Admitting: Cardiovascular Disease

## 2022-09-23 DIAGNOSIS — Z951 Presence of aortocoronary bypass graft: Secondary | ICD-10-CM

## 2022-09-26 ENCOUNTER — Encounter (HOSPITAL_COMMUNITY): Payer: 59

## 2022-09-27 ENCOUNTER — Ambulatory Visit: Payer: 59 | Admitting: Cardiology

## 2022-09-28 ENCOUNTER — Encounter: Payer: Self-pay | Admitting: Cardiology

## 2022-09-28 ENCOUNTER — Encounter (HOSPITAL_COMMUNITY): Payer: 59

## 2022-09-28 ENCOUNTER — Ambulatory Visit: Payer: 59 | Attending: Cardiology | Admitting: Cardiology

## 2022-09-28 VITALS — BP 113/75 | HR 72 | Ht 69.0 in | Wt 215.8 lb

## 2022-09-28 DIAGNOSIS — I739 Peripheral vascular disease, unspecified: Secondary | ICD-10-CM | POA: Diagnosis not present

## 2022-09-28 DIAGNOSIS — E782 Mixed hyperlipidemia: Secondary | ICD-10-CM

## 2022-09-28 DIAGNOSIS — Z951 Presence of aortocoronary bypass graft: Secondary | ICD-10-CM

## 2022-09-28 DIAGNOSIS — I10 Essential (primary) hypertension: Secondary | ICD-10-CM

## 2022-09-28 DIAGNOSIS — I5022 Chronic systolic (congestive) heart failure: Secondary | ICD-10-CM | POA: Diagnosis not present

## 2022-09-28 DIAGNOSIS — I25118 Atherosclerotic heart disease of native coronary artery with other forms of angina pectoris: Secondary | ICD-10-CM

## 2022-09-28 NOTE — Patient Instructions (Signed)
Medication Instructions:  Your physician recommends that you continue on your current medications as directed. Please refer to the Current Medication list given to you today.  *If you need a refill on your cardiac medications before your next appointment, please call your pharmacy*  Lab Work: -None  If you have labs (blood work) drawn today and your tests are completely normal, you will receive your results only by: MyChart Message (if you have MyChart) OR A paper copy in the mail If you have any lab test that is abnormal or we need to change your treatment, we will call you to review the results.  Testing/Procedures: -None  Follow-Up: At [facility], you and your health needs are our priority.  As part of our continuing mission to provide you with exceptional heart care, we have created designated Provider Care Teams.  These Care Teams include your primary Cardiologist (physician) and Advanced Practice Providers (APPs -  Physician Assistants and Nurse Practitioners) who all work together to provide you with the care you need, when you need it.  Your next appointment:   3 month(s)  Provider:   Lorine Bears, MD    Other Instructions -None

## 2022-09-28 NOTE — Progress Notes (Signed)
Cardiology Office Note:   Date:  09/28/2022  ID:  Jonathan [name], DOB 08-Feb-1946, MRN [id number]  History of Present Illness:   Jonathan [name] is a 77 y.o. male with a past medical history of peripheral arterial disease, coronary artery disease status post CABG, essential hypertension, diabetes, hyperlipidemia, gastroesophageal reflux disease, rheumatoid arthritis, chronic kidney disease, and obesity, who is here today for follow-up.  Angiogram done in December 2023 showed no significant arterial iliac disease, on the left side, there was mild to moderate SFA stenosis and no significant popliteal disease.  All tibial and peroneal vessels were occluded with reconstitution of the anterior tibial artery posterior tibial artery via collaterals in the distal portion just above the ankle.  He had attempted angioplasty of the anterior tibial artery antegrade and retrograde was not able to cross due to long calcified occlusion.  Dr. Karin Lieu was consulted for bypass but the patient was found to have borderline quality vein conduits.  He experienced worsening lower extremity edema and exertional dyspnea with episodes of exertional chest pain.  Echocardiogram revealed LVEF of 50-55% mild mitral regurgitation.  Lexiscan Myoview was indeterminate risk study and showed medium size defect in the inferior lateral location consistent with prior infarct with mild peri-infarct ischemia.  Diagnostic catheterization was done February 23 which showed severe three-vessel coronary artery disease.  Right heart catheterization showed moderately elevated wedge pressure and pulmonary pressures.  He underwent CABG on March 7 with LIMA to LAD, SVG to diagonal, SVG to OM1/OM 2, and SVG to right PDA.  He was last seen in clinic 07/21/2022 by Dr. Kirke Corin.  He was doing fairly well overall with no recurrent angina and shortness of breath is improved.  He continued to have significant lower extremity edema.  At that time he was recommended  starting cardiac rehab continue aspirin daily with consideration of adding clopidogrel in the near future.  He returns to clinic today stating that overall he has been doing fairly well.  He has been continuing with cardiac rehab and has been increasing his activity.  He denies any chest pain or shortness of breath.  He states that he does have like a tingling sensation around his scar that happens every once in a while.  Over time it has decreased in the frequency.  He is concerned as he continues to have an ulcer on his toe that has been follow-up with podiatry since he continues to clean it with Betadine and it is not currently infected but they wanted to do some bone shaving and a referral to vascular.  He states he is currently not interested in having surgery as he would rather continue with conservative measures since he just CABG. He has an upcoming follow-up with vascular later this month.  He states that he is following up with CVTS and his chest x-ray and has been released from surgery standpoint.   ROS: 10 point review of systems has been completed and considered negative with exception of what is listed in the HPI  Studies Reviewed:    EKG: No new tracings were completed today  [facility] 06/24/22   Ost RCA to Prox RCA lesion is 30% stenosed.   RPDA lesion is 70% stenosed.   3rd RPL lesion is 100% stenosed.   Ost LAD lesion is 90% stenosed.   Ost Cx to Prox Cx lesion is 80% stenosed.   Mid Cx to Dist Cx lesion is 50% stenosed.   Mid LAD-2 lesion is 80% stenosed.   2nd  Diag lesion is 85% stenosed.   Mid LAD-1 lesion is 50% stenosed.   1st Diag lesion is 80% stenosed.   Mid LM to Dist LM lesion is 20% stenosed.   1.  Severe diffuse three-vessel coronary artery disease with involvement of ostial LAD and ostial left circumflex. 2.  Left ventricular angiography was not performed due to chronic kidney disease.  EF was mildly reduced by echo. 3.  Right heart catheterization showed normal RA  pressure, moderate pulmonary hypertension and normal cardiac output.   RA: 7 mmHg RV: 55/1 mmHg PA: 56/23 with a mean of 37 mmHg Pulmonary wedge pressure: 23 mmHg with prominent V wave suggestive of mitral regurgitation. Cardiac output is 7.63 with an index of 3.54.   Recommendations: Given three-vessel coronary artery disease, diabetic status and diffuse disease, recommend evaluation for CABG.  TTE 06/10/22 1. Left ventricular ejection fraction, by estimation, is 50 to 55%. The  left ventricle has low normal function. The left ventricle has no regional  wall motion abnormalities. There is mild left ventricular hypertrophy of  the basal-septal segment. Left  ventricular diastolic parameters are consistent with Grade I diastolic  dysfunction (impaired relaxation).   2. Right ventricular systolic function is normal. The right ventricular  size is normal.   3. The mitral valve is normal in structure. Mild mitral valve  regurgitation. No evidence of mitral stenosis.   4. The aortic valve is tricuspid. Aortic valve regurgitation is not  visualized. Aortic valve sclerosis is present, with no evidence of aortic  valve stenosis.   5. The inferior vena cava is normal in size with greater than 50%  respiratory variability, suggesting right atrial pressure of 3 mmHg.   Risk Assessment/Calculations:              Physical Exam:   VS:  BP 113/75 (BP Location: Left Arm, Patient Position: Sitting, Cuff Size: Normal)   Pulse 72   Ht 5\' 9"  (1.753 m)   Wt 215 lb 12.8 oz (97.9 kg)   SpO2 98%   BMI 31.87 kg/m    Wt Readings from Last 3 Encounters:  09/28/22 215 lb 12.8 oz (97.9 kg)  09/19/22 213 lb 13.5 oz (97 kg)  09/14/22 212 lb 4.9 oz (96.3 kg)     GEN: Well nourished, well developed in no acute distress NECK: No JVD; No carotid bruits CARDIAC: RRR, no murmurs, rubs, gallops RESPIRATORY:  Clear to auscultation without rales, wheezing or rhonchi  ABDOMEN: Soft, non-tender,  non-distended EXTREMITIES:  1+ LLE greater than RLE edema; No deformity   ASSESSMENT AND PLAN:   Coronary artery disease involving native coronary arteries without angina status post CABG.  Patient remains chest pain-free and notes improved and breathing without any shortness of breath.  He has been participating in cardiac rehab without any complaints.  He is continued on aspirin 81 mg daily and rosuvastatin 10 mg daily.    Peripheral arterial disease with rest pain that has improved.  He continues to have an ulceration to his toe that is being treated by podiatry.  He also has an upcoming appointment with vascular.  Previously it was not favored for revascularization unless his symptoms have worsened.  Hypertension with blood pressure 113/75 that has remained stable.  He is continued on HCTZ 25 mg daily, metoprolol tartrate 50 mg twice daily.  Encouraged to continue to monitor blood pressures 1 to 2 hours post medications.  Hyperlipidemia with goal LDL of 69.  Goal is less than 70.  He is continued on rosuvastatin 10 mg daily.  Chronic HFrEF with mildly reduced ejection fraction.  LVEF of 50-55% in 2/24.  He is continued on his current medications.  Blood pressure has been on the lower side, continue to escalate GDMT as tolerated by blood pressure.Consider consolidating metoprolol on return or if continued elevated blood pressure can initiate ACE/ARB/ARNI currently denies any shortness of breath and improvement in lower extremity edema.  Encouraged to continue low-sodium diet and daily weights.  If experiences increase in edema or weight gain can consider adding low-dose furosemide.  Weight is down 3 pounds today.  Disposition the patient return to clinic to see MD/APP in 2 to 3 months to reevaluate symptoms continue with escalation of GDMT as long as blood pressure allows.        Signed, Rhyan Wolters, NP

## 2022-09-29 NOTE — Progress Notes (Signed)
Office Note     CC: Bilateral lower extremity critical ischemia Requesting Provider:  Anabel Halon, MD  HPI: Jonathan [name] is a 77 y.o. (09/16/1945) male presenting in follow-up with known left lower extremity limb ischemia with tissue loss of the toe.  At his last visit, Rhylee was scheduled for high risk left lower extremity bypass surgery, but during cardiac workup was noted to have severe three-vessel disease requiring CABG.  His CABG went well, and he presents today to discuss his left lower extremity further.    Originally from [place], they now reside in [place].  Per has a longstanding history of type 1 diabetes.  On exam, starting was doing well, accompanied by his wife.  He was all smiles, and states he feels much better than he did months ago.  The wound on his left foot between his fourth and fifth toe has healed.  He continues to see Dr. Annamary Rummage for all of his podiatric needs.   Certainly continues to have rest pain, which bothers him most at night.  He sleeps in a chair with his left leg in the dependent position which alleviates the pain.    Surgical history includes left lower extremity angiogram with Dr. Renato Gails in December 2023 in an effort to define and improve perfusion or rest pain.  Angiogram demonstrated severe tibial disease with reconstitution of the dorsalis pedis and distal posterior tibial arteries.  Pedal access, retrograde approach was attempted, but failed.   The pt is  on a statin for cholesterol management.  The pt is  on a daily aspirin.   Other AC:  - The pt is  on medication for hypertension.   The pt is  diabetic.  Tobacco hx:  -  Past Medical History:  Diagnosis Date   Arthritis    ra, sees dr Alean Rinne   Chronic kidney disease    Coronary artery disease    saw dr Armanda Magic in [place] 20 yrs ago, released from cardiology   Diabetes mellitus without complication [facility])    type 2   Dyspnea 07/2022   Lately r/t CAD   GERD  (gastroesophageal reflux disease)    Hypercholesteremia    Hypertension    Peripheral vascular disease (HCC)    Stroke (HCC) 2007   "Light Stroke" per pt. No deficits   Urethral stricture     Past Surgical History:  Procedure Laterality Date   ABDOMINAL AORTOGRAM W/LOWER EXTREMITY N/A 04/20/2022   Procedure: ABDOMINAL AORTOGRAM W/LOWER EXTREMITY;  Surgeon: Iran Ouch, MD;  Location: [facility];  Service: Cardiovascular;  Laterality: N/A;   BACK SURGERY     x 3 lower back   CARDIAC CATHETERIZATION  2000   small amt plaque relased from cardiology 20 yrs ago   CHOLECYSTECTOMY     COLONOSCOPY WITH PROPOFOL N/A 10/26/2021   Procedure: COLONOSCOPY WITH PROPOFOL;  Surgeon: Lanelle Bal, DO;  Location: [facility];  Service: Endoscopy;  Laterality: N/A;  To arrive at 1200   CORONARY ARTERY BYPASS GRAFT N/A 07/07/2022   Procedure: CORONARY ARTERY BYPASS GRAFTING (CABG) X5 USING OPEN LEFT INTERNAL MAMMARY ARTERY AND ENDOSCOPICALLY HARVESTED RIGHT GREATER SAPHENOUS VEIN;  Surgeon: Alleen Borne, MD;  Location: [facility] OR;  Service: Open Heart Surgery;  Laterality: N/A;   CYSTOSCOPY WITH URETHRAL DILATATION N/A 04/17/2019   Procedure: CYSTOSCOPY WITH URETHRAL DILATATION, CYSTOGRAM;  Surgeon: Rene Paci, MD;  Location: [facility];  Service: Urology;  Laterality: N/A;  EYE SURGERY Bilateral    cataracts   IR RADIOLOGIST EVAL & MGMT  07/12/2021   PERIPHERAL VASCULAR BALLOON ANGIOPLASTY  04/20/2022   Procedure: PERIPHERAL VASCULAR BALLOON ANGIOPLASTY;  Surgeon: Iran Ouch, MD;  Location: [facility];  Service: Cardiovascular;;  aborted; unable to cross   POLYPECTOMY  10/26/2021   Procedure: POLYPECTOMY INTESTINAL;  Surgeon: Lanelle Bal, DO;  Location: [facility];  Service: Endoscopy;;   RIGHT/LEFT HEART CATH AND CORONARY ANGIOGRAPHY Bilateral 06/24/2022   Procedure: RIGHT/LEFT HEART CATH AND CORONARY ANGIOGRAPHY;  Surgeon: Iran Ouch, MD;  Location: [facility];  Service: Cardiovascular;  Laterality: Bilateral;   TEE WITHOUT CARDIOVERSION N/A 07/07/2022   Procedure: TRANSESOPHAGEAL ECHOCARDIOGRAM;  Surgeon: Alleen Borne, MD;  Location: [facility] OR;  Service: Open Heart Surgery;  Laterality: N/A;   urethral stricture surgery  15 yrs ago    Social History   Socioeconomic History   Marital status: Married    Spouse name: Not on file   Number of children: Not on file   Years of education: Not on file   Highest education level: Not on file  Occupational History   Not on file  Tobacco Use   Smoking status: Never   Smokeless tobacco: Never  Vaping Use   Vaping Use: Never used  Substance and Sexual Activity   Alcohol use: No   Drug use: No   Sexual activity: Yes  Other Topics Concern   Not on file  Social History Narrative   Not on file   Social Determinants of Health   Financial Resource Strain: Low Risk  (11/15/2021)   Overall Financial Resource Strain (CARDIA)    Difficulty of Paying Living Expenses: Not hard at all  Food Insecurity: No Food Insecurity (07/11/2022)   Hunger Vital Sign    Worried About Running Out of Food in the Last Year: Never true    Ran Out of Food in the Last Year: Never true  Transportation Needs: No Transportation Needs (07/11/2022)   PRAPARE - Administrator, Civil Service (Medical): No    Lack of Transportation (Non-Medical): No  Physical Activity: Sufficiently Active (11/15/2021)   Exercise Vital Sign    Days of Exercise per Week: 5 days    Minutes of Exercise per Session: 30 min  Stress: No Stress Concern Present (11/15/2021)   [employer] of Occupational Health - Occupational Stress Questionnaire    Feeling of Stress : Not at all  Social Connections: Moderately Integrated (11/15/2021)   Social Connection and Isolation Panel [NHANES]    Frequency of Communication with Friends and Family: Three times a week    Frequency of Social Gatherings with  Friends and Family: Twice a week    Attends Religious Services: More than 4 times per year    Active Member of [employer] or Organizations: No    Attends Banker Meetings: Never    Marital Status: Married  Catering manager Violence: Not At Risk (07/11/2022)   Humiliation, Afraid, Rape, and Kick questionnaire    Fear of Current or Ex-Partner: No    Emotionally Abused: No    Physically Abused: No    Sexually Abused: No   Family History  Problem Relation Age of Onset   Heart Problems Father     Current Outpatient Medications  Medication Sig Dispense Refill   acetaminophen (TYLENOL) 325 MG tablet Take 2 tablets (650 mg total) by mouth every 4 (four) hours as needed.  albuterol (VENTOLIN HFA) 108 (90 Base) MCG/ACT inhaler Inhale 1-2 puffs into the lungs every 6 (six) hours as needed for wheezing or shortness of breath.     aspirin EC 81 MG tablet Take 81 mg by mouth daily. Swallow whole.     cholecalciferol (VITAMIN D3) 25 MCG (1000 UNIT) tablet Take 1,000 Units by mouth daily.     cyanocobalamin (VITAMIN B12) 1000 MCG tablet Take 1,000 mcg by mouth daily.     finasteride (PROSCAR) 5 MG tablet Take 1 tablet (5 mg total) by mouth daily. 90 tablet 3   folic acid (FOLVITE) 1 MG tablet Take 1 mg by mouth daily.     gabapentin (NEURONTIN) 300 MG capsule Take 1 capsule (300 mg total) by mouth at bedtime. 30 capsule 5   glipiZIDE (GLUCOTROL) 5 MG tablet Take 1 tablet (5 mg total) by mouth 2 (two) times daily before a meal. 30 tablet 0   hydrochlorothiazide (HYDRODIURIL) 25 MG tablet Take 1 tablet (25 mg total) by mouth daily. 90 tablet 1   hydrocortisone (ANUSOL-HC) 2.5 % rectal cream Place 1 Application rectally 2 (two) times daily. For 10 days. May repeat cycle if needed. (Patient not taking: Reported on 09/28/2022) 30 g 1   levocetirizine (XYZAL) 5 MG tablet Take 1 tablet (5 mg total) by mouth every evening. 30 tablet 5   linaclotide (LINZESS) 145 MCG CAPS capsule Take 145 mcg by mouth  daily as needed (constipation).     Menthol, Topical Analgesic, (BLUE-EMU MAXIMUM STRENGTH EX) Apply 1 Application topically daily as needed (pain).     methotrexate (RHEUMATREX) 2.5 MG tablet Take 5 tablets (12.5 mg total) by mouth every Sunday. Resume on Sunday 07/24/22. Caution:Chemotherapy. Protect from light. 5 tablet 0   metoprolol tartrate (LOPRESSOR) 50 MG tablet TAKE 1 TABLET BY MOUTH TWICE  DAILY 200 tablet 2   omeprazole (PRILOSEC) 20 MG capsule Take 1 capsule (20 mg total) by mouth daily. 30 capsule 5   predniSONE (DELTASONE) 1 MG tablet Take 1 mg by mouth every other day. Takes 1 tablet every other day for 30 days     rosuvastatin (CRESTOR) 10 MG tablet TAKE 1 TABLET BY MOUTH DAILY 90 tablet 3   silodosin (RAPAFLO) 8 MG CAPS capsule Take 1 capsule (8 mg total) by mouth in the morning and at bedtime. (Patient taking differently: Take 8 mg by mouth daily.) 180 capsule 3   triamcinolone cream (KENALOG) 0.1 % Apply 1 Application topically daily as needed (itching). (Patient not taking: Reported on 09/28/2022) 80 g 1   vitamin E 180 MG (400 UNITS) capsule Take 400 Units by mouth daily.     No current facility-administered medications for this visit.    Allergies  Allergen Reactions   Ciprofloxacin Itching   Mosquito (Diagnostic) Itching   Oxycodone-Acetaminophen Rash     REVIEW OF SYSTEMS:  [X]  denotes positive finding, [ ]  denotes negative finding Cardiac  Comments:  Chest pain or chest pressure:    Shortness of breath upon exertion:    Short of breath when lying flat:    Irregular heart rhythm:        Vascular    Pain in calf, thigh, or hip brought on by ambulation:    Pain in feet at night that wakes you up from your sleep:     Blood clot in your veins:    Leg swelling:         Pulmonary    Oxygen at home:    Productive  cough:     Wheezing:         Neurologic    Sudden weakness in arms or legs:     Sudden numbness in arms or legs:     Sudden onset of difficulty  speaking or slurred speech:    Temporary loss of vision in one eye:     Problems with dizziness:         Gastrointestinal    Blood in stool:     Vomited blood:         Genitourinary    Burning when urinating:     Blood in urine:        Psychiatric    Major depression:         Hematologic    Bleeding problems:    Problems with blood clotting too easily:        Skin    Rashes or ulcers:        Constitutional    Fever or chills:      PHYSICAL EXAMINATION:  There were no vitals filed for this visit.   General:  WDWN in NAD; vital signs documented above Gait: Not observed HENT: WNL, normocephalic Pulmonary: normal non-labored breathing , without wheezing Cardiac: regular HR Abdomen: soft, NT, no masses Skin: without rashes Vascular Exam/Pulses:  Right Left  Radial 2+ (normal) 2+ (normal)  Ulnar 2+ (normal) 2+ (normal)  Femoral    Popliteal    DP absent absent  PT absent absent   Extremities: with ischemic changes, without Gangrene , without cellulitis; with open wounds;  Musculoskeletal: no muscle wasting or atrophy  Neurologic: A&O X 3;  No focal weakness or paresthesias are detected Psychiatric:  The pt has Normal affect.   Non-Invasive Vascular Imaging:   See angiogram    ASSESSMENT/PLAN: FREEMONT KLUS is a 77 y.o. male presenting with left lower extremity critical limb ischemia with rest pain.  The lesion on the inner portion of the left fifth digit appears to have healed.  The lesion likely occurred due to moisture.  We discussed the importance of keeping the webspaces dry.    We had a long discussion regarding management of his critical limb ischemia with rest pain.  Prior angiography demonstrates poor tibial outflow with reconstitution of the pedal arteries.  Retrograde approach was unsuccessful.  A bypass to the pedal artery for rest pain has a very high failure rate due to poor outflow. Being that his rest pain is tolerable, and it appears that his  tissue loss has, I think the best course of action is continued medical management as he does not have a good bypass target.  Being that his rest pain is tolerable, appears tissue loss, right lower extremity critical limb ischemia with rest pain.  I have started him to continue his current medication regimen.  My plan is to see him in 6 months.  He was asked to call my office should new ulcerations occur, or rest pain worsen.   Victorino Sparrow, MD [facility] [phone]

## 2022-09-30 ENCOUNTER — Encounter (HOSPITAL_COMMUNITY): Payer: 59

## 2022-09-30 ENCOUNTER — Ambulatory Visit (INDEPENDENT_AMBULATORY_CARE_PROVIDER_SITE_OTHER): Payer: 59 | Admitting: Vascular Surgery

## 2022-09-30 ENCOUNTER — Encounter: Payer: Self-pay | Admitting: Vascular Surgery

## 2022-09-30 VITALS — BP 167/97 | HR 70 | Temp 97.8°F | Resp 20 | Ht 69.0 in | Wt 217.0 lb

## 2022-09-30 DIAGNOSIS — I70222 Atherosclerosis of native arteries of extremities with rest pain, left leg: Secondary | ICD-10-CM | POA: Diagnosis not present

## 2022-10-03 ENCOUNTER — Encounter (HOSPITAL_COMMUNITY): Payer: 59

## 2022-10-04 ENCOUNTER — Other Ambulatory Visit: Payer: Self-pay

## 2022-10-04 DIAGNOSIS — I70223 Atherosclerosis of native arteries of extremities with rest pain, bilateral legs: Secondary | ICD-10-CM

## 2022-10-05 ENCOUNTER — Encounter (HOSPITAL_COMMUNITY): Payer: 59

## 2022-10-06 ENCOUNTER — Other Ambulatory Visit: Payer: Self-pay | Admitting: Internal Medicine

## 2022-10-07 ENCOUNTER — Encounter (HOSPITAL_COMMUNITY): Payer: 59

## 2022-10-10 ENCOUNTER — Encounter (HOSPITAL_COMMUNITY): Payer: 59

## 2022-10-12 ENCOUNTER — Encounter (HOSPITAL_COMMUNITY): Payer: 59

## 2022-10-13 NOTE — Addendum Note (Signed)
Encounter addended by: Suann Larry, RN on: 10/13/2022 2:08 PM  Actions taken: Flowsheet data copied forward, Flowsheet accepted, Clinical Note Signed

## 2022-10-13 NOTE — Addendum Note (Signed)
Encounter addended by: Suann Larry, RN on: 10/13/2022 2:09 PM  Actions taken: Episode resolved

## 2022-10-13 NOTE — Progress Notes (Signed)
Discharge Progress Report  Patient Details  Name: Jonathan [name] MRN: [id number] Date of Birth: 1945/07/23 Referring Provider:   Flowsheet Row CARDIAC REHAB PHASE II EXERCISE from 09/14/2022 in [place] [facility]  Referring Provider Bartle        Number of Visits: 3  Reason for Discharge:  Early Exit:  Lack of attendance  Smoking History:  Social History   Tobacco Use  Smoking Status Never  Smokeless Tobacco Never    Diagnosis:  S/P CABG x 5  ADL UCSD:   Initial Exercise Prescription:  Initial Exercise Prescription - 09/14/22 1000       Date of Initial Exercise RX and Referring Provider   Date 09/14/22    Referring Provider Bartle    Expected Discharge Date 12/02/22      NuStep   Level 1    SPM 60    Minutes 22      Arm Ergometer   Level 1    RPM 45    Minutes 17      Prescription Details   Frequency (times per week) 3    Duration Progress to 30 minutes of continuous aerobic without signs/symptoms of physical distress      Intensity   THRR 40-80% of Max Heartrate 58-116    Ratings of Perceived Exertion 11-13      Resistance Training   Training Prescription Yes    Weight 3    Reps 10-15             Discharge Exercise Prescription (Final Exercise Prescription Changes):  Exercise Prescription Changes - 09/21/22 1200       Response to Exercise   Blood Pressure (Admit) 130/70    Blood Pressure (Exercise) 132/70    Blood Pressure (Exit) 124/66    Heart Rate (Admit) 83 bpm    Heart Rate (Exercise) 98 bpm    Heart Rate (Exit) 70 bpm    Rating of Perceived Exertion (Exercise) 13    Duration Continue with 30 min of aerobic exercise without signs/symptoms of physical distress.    Intensity THRR unchanged      Progression   Progression Continue to progress workloads to maintain intensity without signs/symptoms of physical distress.      Resistance Training   Training Prescription Yes    Weight 3    Reps 10-15    Time 10  Minutes      NuStep   Level 2    SPM 95    Minutes 22    METs 2.18      Arm Ergometer   Level 1.5    RPM 69    Minutes 17    METs 2.25             Functional Capacity:  6 Minute Walk     Row Name 09/14/22 1033         6 Minute Walk   Phase Initial     Distance 900 feet     Walk Time 6 minutes     # of Rest Breaks 0     MPH 1.7     METS 1.87     RPE 12     VO2 Peak 6.54     Symptoms No     Resting HR 76 bpm     Resting BP 124/80     Resting Oxygen Saturation  98 %     Exercise Oxygen Saturation  during 6 min walk 99 %     Max  Ex. HR 102 bpm     Max Ex. BP 140/80     2 Minute Post BP 128/80              Psychological, QOL, Others - Outcomes: PHQ 2/9:    09/07/2022    2:05 PM 06/28/2022   10:05 AM 03/28/2022    3:00 PM 03/09/2022    9:19 AM 03/04/2022   11:18 AM  Depression screen PHQ 2/9  Decreased Interest 0 2 0 0 0  Down, Depressed, Hopeless 0 0 0 0 0  PHQ - 2 Score 0 2 0 0 0  Altered sleeping 1 1     Tired, decreased energy 0 1     Change in appetite 0 0     Feeling bad or failure about yourself  0 0     Trouble concentrating 0 0     Moving slowly or fidgety/restless 0 0     Suicidal thoughts 0 0     PHQ-9 Score 1 4     Difficult doing work/chores Somewhat difficult Somewhat difficult       Quality of Life:  Quality of Life - 09/07/22 1419       Quality of Life   Select Quality of Life      Quality of Life Scores   Health/Function Pre 22.39 %    Socioeconomic Pre 24.71 %    Psych/Spiritual Pre 30 %    Family Pre 24.5 %    GLOBAL Pre 24.82 %             Personal Goals: Goals established at orientation with interventions provided to work toward goal.  Personal Goals and Risk Factors at Admission - 09/07/22 1444       Core Components/Risk Factors/Patient Goals on Admission    Weight Management Obesity    Diabetes Yes    Intervention Provide education about signs/symptoms and action to take for hypo/hyperglycemia.;Provide  education about proper nutrition, including hydration, and aerobic/resistive exercise prescription along with prescribed medications to achieve blood glucose in normal ranges: Fasting glucose 65-99 mg/dL    Expected Outcomes Short Term: Participant verbalizes understanding of the signs/symptoms and immediate care of hyper/hypoglycemia, proper foot care and importance of medication, aerobic/resistive exercise and nutrition plan for blood glucose control.;Long Term: Attainment of HbA1C < 7%.    Hypertension Yes    Intervention Provide education on lifestyle modifcations including regular physical activity/exercise, weight management, moderate sodium restriction and increased consumption of fresh fruit, vegetables, and low fat dairy, alcohol moderation, and smoking cessation.;Monitor prescription use compliance.    Expected Outcomes Short Term: Continued assessment and intervention until BP is < 140/71mm HG in hypertensive participants. < 130/30mm HG in hypertensive participants with diabetes, heart failure or chronic kidney disease.;Long Term: Maintenance of blood pressure at goal levels.    Lipids Yes    Intervention Provide education and support for participant on nutrition & aerobic/resistive exercise along with prescribed medications to achieve LDL 70mg , HDL >40mg .    Expected Outcomes Short Term: Participant states understanding of desired cholesterol values and is compliant with medications prescribed. Participant is following exercise prescription and nutrition guidelines.    Personal Goal Other Yes    Personal Goal Patient wants to get back to the activities he was doing prior to his surgery.    Intervention Patient will attend CR 3 days/week with exercise and education.    Expected Outcomes Patient will complete the program meeting both personal and program goals.  Personal Goals Discharge:  Goals and Risk Factor Review     Row Name 09/19/22 0818 10/13/22 1401            Core Components/Risk Factors/Patient Goals Review   Personal Goals Review Weight Management/Obesity;Lipids;Diabetes;Hypertension;Other Weight Management/Obesity;Lipids;Diabetes;Hypertension;Other      Review Patient was referred to CR with CABGx5. He has multiple risk factors for CAD and is participating in the program for risk modification. He has completed 3 sessions and is doing well in the program. His DM is manged with Glipizide. His last A1C was 07/05/22 at 6.1%. His personal goals for the program are to get back to his activities he was doing prior ot his surgery. We will continue to monitor his progress as he works towards meeting these goals. Patient was discharged due to lack of attendance. He completed 3 sessions. No goals measured.      Expected Outcomes Patient will complete the program meeting both personal and program goals. --               Exercise Goals and Review:  Exercise Goals     Row Name 09/14/22 1037 09/20/22 0746           Exercise Goals   Increase Physical Activity Yes Yes      Intervention Provide advice, education, support and counseling about physical activity/exercise needs.;Develop an individualized exercise prescription for aerobic and resistive training based on initial evaluation findings, risk stratification, comorbidities and participant's personal goals. Provide advice, education, support and counseling about physical activity/exercise needs.;Develop an individualized exercise prescription for aerobic and resistive training based on initial evaluation findings, risk stratification, comorbidities and participant's personal goals.      Expected Outcomes Short Term: Attend rehab on a regular basis to increase amount of physical activity.;Long Term: Add in home exercise to make exercise part of routine and to increase amount of physical activity.;Long Term: Exercising regularly at least 3-5 days a week. Short Term: Attend rehab on a regular basis to increase  amount of physical activity.;Long Term: Add in home exercise to make exercise part of routine and to increase amount of physical activity.;Long Term: Exercising regularly at least 3-5 days a week.      Increase Strength and Stamina Yes Yes      Intervention Provide advice, education, support and counseling about physical activity/exercise needs.;Develop an individualized exercise prescription for aerobic and resistive training based on initial evaluation findings, risk stratification, comorbidities and participant's personal goals. Provide advice, education, support and counseling about physical activity/exercise needs.;Develop an individualized exercise prescription for aerobic and resistive training based on initial evaluation findings, risk stratification, comorbidities and participant's personal goals.      Expected Outcomes Short Term: Increase workloads from initial exercise prescription for resistance, speed, and METs.;Short Term: Perform resistance training exercises routinely during rehab and add in resistance training at home;Long Term: Improve cardiorespiratory fitness, muscular endurance and strength as measured by increased METs and functional capacity ( ) Short Term: Increase workloads from initial exercise prescription for resistance, speed, and METs.;Short Term: Perform resistance training exercises routinely during rehab and add in resistance training at home;Long Term: Improve cardiorespiratory fitness, muscular endurance and strength as measured by increased METs and functional capacity ( )      Able to understand and use rate of perceived exertion (RPE) scale Yes Yes      Intervention Provide education and explanation on how to use RPE scale Provide education and explanation on how to use RPE scale      Expected  Outcomes Short Term: Able to use RPE daily in rehab to express subjective intensity level;Long Term:  Able to use RPE to guide intensity level when exercising independently  Short Term: Able to use RPE daily in rehab to express subjective intensity level;Long Term:  Able to use RPE to guide intensity level when exercising independently      Knowledge and understanding of Target Heart Rate Range (THRR) Yes Yes      Intervention Provide education and explanation of THRR including how the numbers were predicted and where they are located for reference Provide education and explanation of THRR including how the numbers were predicted and where they are located for reference      Expected Outcomes Short Term: Able to state/look up THRR;Long Term: Able to use THRR to govern intensity when exercising independently;Short Term: Able to use daily as guideline for intensity in rehab Short Term: Able to state/look up THRR;Long Term: Able to use THRR to govern intensity when exercising independently;Short Term: Able to use daily as guideline for intensity in rehab      Able to check pulse independently Yes Yes      Intervention Provide education and demonstration on how to check pulse in carotid and radial arteries.;Review the importance of being able to check your own pulse for safety during independent exercise Provide education and demonstration on how to check pulse in carotid and radial arteries.;Review the importance of being able to check your own pulse for safety during independent exercise      Expected Outcomes Short Term: Able to explain why pulse checking is important during independent exercise;Long Term: Able to check pulse independently and accurately Short Term: Able to explain why pulse checking is important during independent exercise;Long Term: Able to check pulse independently and accurately      Understanding of Exercise Prescription Yes Yes      Intervention Provide education, explanation, and written materials on patient's individual exercise prescription Provide education, explanation, and written materials on patient's individual exercise prescription      Expected  Outcomes Short Term: Able to explain program exercise prescription;Long Term: Able to explain home exercise prescription to exercise independently Short Term: Able to explain program exercise prescription;Long Term: Able to explain home exercise prescription to exercise independently               Exercise Goals Re-Evaluation:  Exercise Goals Re-Evaluation     Row Name 09/20/22 0746             Exercise Goal Re-Evaluation   Exercise Goals Review Increase Physical Activity;Increase Strength and Stamina;Able to understand and use rate of perceived exertion (RPE) scale;Knowledge and understanding of Target Heart Rate Range (THRR);Able to check pulse independently;Understanding of Exercise Prescription       Comments Pt has completed 3 sessions of cardiac rehab. He is egar to be in the program and is ready to work. He has shown up late the past two sessions to not be included in the full warmup. He is slighly deconditioned but has show progress in the number of SPM/RPM each class. He is currently exercising at 2.15 METs on the stepper. Will continue to monitor and progress as able.       Expected Outcomes Through exercise at rehab and home, the patient will meet their stated goals.                Nutrition & Weight - Outcomes:  Pre Biometrics - 09/14/22 1037  Pre Biometrics   Height 5\' 9"  (1.753 m)    Weight 96.3 kg    Waist Circumference 41 inches    Hip Circumference 48 inches    Waist to Hip Ratio 0.85 %    BMI (Calculated) 31.34    Triceps Skinfold 15 mm    % Body Fat 29.4 %    Grip Strength 22.6 kg    Flexibility 0 in    Single Leg Stand 0 seconds              Nutrition:  Nutrition Therapy & Goals - 09/19/22 0833       Nutrition Therapy   RD appointment deferred Yes      Personal Nutrition Goals   Additional Goals? No    Comments We provide educational sessions on heart healthy nutrition with handouts and assistance with RD referral if patient is  interested.      Intervention Plan   Intervention Nutrition handout(s) given to patient.    Expected Outcomes Short Term Goal: Understand basic principles of dietary content, such as calories, fat, sodium, cholesterol and nutrients.             Nutrition Discharge:  Nutrition Assessments - 09/07/22 1442       Rate Your Plate Scores   Pre Score 28             Education Questionnaire Score:  Knowledge Questionnaire Score - 09/07/22 1443       Knowledge Questionnaire Score   Pre Score 10/28             Patient was discharged due to lack of attendance. He completed 3 sessions. No goals measured.

## 2022-10-14 ENCOUNTER — Other Ambulatory Visit: Payer: Self-pay | Admitting: Cardiovascular Disease

## 2022-10-14 ENCOUNTER — Encounter (HOSPITAL_COMMUNITY): Payer: 59

## 2022-10-17 ENCOUNTER — Encounter (HOSPITAL_COMMUNITY): Payer: 59

## 2022-10-19 ENCOUNTER — Encounter (HOSPITAL_COMMUNITY): Payer: 59

## 2022-10-20 ENCOUNTER — Ambulatory Visit (INDEPENDENT_AMBULATORY_CARE_PROVIDER_SITE_OTHER): Payer: 59 | Admitting: Podiatry

## 2022-10-20 DIAGNOSIS — L97522 Non-pressure chronic ulcer of other part of left foot with fat layer exposed: Secondary | ICD-10-CM | POA: Diagnosis not present

## 2022-10-20 DIAGNOSIS — E1142 Type 2 diabetes mellitus with diabetic polyneuropathy: Secondary | ICD-10-CM

## 2022-10-20 DIAGNOSIS — I999 Unspecified disorder of circulatory system: Secondary | ICD-10-CM | POA: Diagnosis not present

## 2022-10-20 NOTE — Progress Notes (Signed)
Subjective:  Patient ID: Jonathan [name], male    DOB: [birth year],  MRN: [id number]  Chief Complaint  Patient presents with   Foot Pain    Pt states bil foot hurts but the left one hurts worse he says he did not hit it on nothing pt is asking if we do shots for the pain     77 y.o. male presents follow-up of maceration and prior ulceration in the fourth webspace on the left foot and pain related to ischemic pain in the left foot.  He has previously had injections and steroids in the past have not been helpful.  Believe most of his pain is related to vascular insufficiency and ischemic pain.  He has been seen by vascular unfortunately he is not a candidate for bypass as he is not an endovascular candidate either.  Per their note he can tolerate his ischemic pain plan medical management.  Past Medical History:  Diagnosis Date   Arthritis    ra, sees dr Alean Rinne   Chronic kidney disease    Coronary artery disease    saw dr Armanda Magic in [place] 20 yrs ago, released from cardiology   Diabetes mellitus without complication [facility])    type 2   Dyspnea 07/2022   Lately r/t CAD   GERD (gastroesophageal reflux disease)    Hypercholesteremia    Hypertension    Peripheral vascular disease (HCC)    Stroke (HCC) 2007   "Light Stroke" per pt. No deficits   Urethral stricture     Allergies  Allergen Reactions   Ciprofloxacin Itching   Mosquito (Diagnostic) Itching   Oxycodone-Acetaminophen Rash    ROS: Negative except as per HPI above  Objective:  General: AAO x3, NAD  Dermatological: Attention to the medial aspect of the 5th toe left foot there is no significant maceration or open wound in the fourth interdigital space.   Vascular:  Dorsalis Pedis artery and Posterior Tibial artery pedal pulses are 2/4 bilateral.  Capillary fill time < 3 sec to all digits.   Neruologic: Grossly intact via light touch bilateral. Protective threshold intact to all sites bilateral.    Musculoskeletal: Pain with palpation of the left fifth toe and fifth MPJ as well as the lateral forefoot.  Also with some pain on the right medial ankle along the course the posterior tibial tendon  Gait: Unassisted, Nonantalgic.   Radiographs:  Date: 05/05/22 XR both feet Weightbearing AP/Lateral/Oblique   Findings: digital contractures of the 2nd toe bilaterally Assessment:   1. Vascular abnormality   2. Ulcer of left foot with fat layer exposed (HCC)   3. DM type 2 with diabetic peripheral neuropathy (HCC)     Plan:  Patient was evaluated and treated and all questions answered.  # Prior ulceration of the fourth webspace of the left foot -Improved with wound care -Continue Betadine solution and gauze to dry out in the fourth webspace.  Do not let the skin over macerated.  # Ischemic pain of the left foot -Discussed his pain is primarily related to ischemic pain.  At this point unfortunately he is not a candidate for vascular intervention. -Continue to monitor -Would see potential benefit from nerve medication like gabapentin  Return if symptoms worsen or fail to improve.          Corinna Gab, DPM [facility]

## 2022-10-21 ENCOUNTER — Encounter (HOSPITAL_COMMUNITY): Payer: 59

## 2022-10-24 ENCOUNTER — Encounter (HOSPITAL_COMMUNITY): Payer: 59

## 2022-10-25 ENCOUNTER — Ambulatory Visit: Payer: 59 | Admitting: Internal Medicine

## 2022-10-26 ENCOUNTER — Encounter (HOSPITAL_COMMUNITY): Payer: 59

## 2022-10-28 ENCOUNTER — Encounter (HOSPITAL_COMMUNITY): Payer: 59

## 2022-10-31 ENCOUNTER — Encounter (HOSPITAL_COMMUNITY): Payer: 59

## 2022-11-02 ENCOUNTER — Encounter (HOSPITAL_COMMUNITY): Payer: 59

## 2022-11-02 ENCOUNTER — Other Ambulatory Visit: Payer: Self-pay | Admitting: Internal Medicine

## 2022-11-02 DIAGNOSIS — I10 Essential (primary) hypertension: Secondary | ICD-10-CM

## 2022-11-04 ENCOUNTER — Encounter (HOSPITAL_COMMUNITY): Payer: 59

## 2022-11-07 ENCOUNTER — Encounter (HOSPITAL_COMMUNITY): Payer: 59

## 2022-11-09 ENCOUNTER — Encounter (HOSPITAL_COMMUNITY): Payer: 59

## 2022-11-11 ENCOUNTER — Encounter (HOSPITAL_COMMUNITY): Payer: 59

## 2022-11-14 ENCOUNTER — Encounter (HOSPITAL_COMMUNITY): Payer: 59

## 2022-11-16 ENCOUNTER — Encounter (HOSPITAL_COMMUNITY): Payer: 59

## 2022-11-18 ENCOUNTER — Encounter (HOSPITAL_COMMUNITY): Payer: 59

## 2022-11-21 ENCOUNTER — Encounter (HOSPITAL_COMMUNITY): Payer: 59

## 2022-11-23 ENCOUNTER — Other Ambulatory Visit: Payer: Self-pay | Admitting: Interventional Radiology

## 2022-11-23 ENCOUNTER — Encounter (HOSPITAL_COMMUNITY): Payer: 59

## 2022-11-23 DIAGNOSIS — I739 Peripheral vascular disease, unspecified: Secondary | ICD-10-CM

## 2022-11-25 ENCOUNTER — Encounter (HOSPITAL_COMMUNITY): Payer: 59

## 2022-11-28 ENCOUNTER — Encounter (HOSPITAL_COMMUNITY): Payer: 59

## 2022-11-30 ENCOUNTER — Encounter (HOSPITAL_COMMUNITY): Payer: 59

## 2022-12-02 ENCOUNTER — Encounter (HOSPITAL_COMMUNITY): Payer: 59

## 2022-12-05 ENCOUNTER — Other Ambulatory Visit (HOSPITAL_COMMUNITY): Payer: Self-pay | Admitting: Family Medicine

## 2022-12-05 DIAGNOSIS — R1310 Dysphagia, unspecified: Secondary | ICD-10-CM

## 2022-12-13 ENCOUNTER — Encounter: Payer: Self-pay | Admitting: Internal Medicine

## 2022-12-14 ENCOUNTER — Ambulatory Visit
Admission: RE | Admit: 2022-12-14 | Discharge: 2022-12-14 | Disposition: A | Discharge status: Home / Self Care | Payer: 59 | Source: Ambulatory Visit | Attending: Interventional Radiology | Admitting: Interventional Radiology

## 2022-12-14 DIAGNOSIS — I739 Peripheral vascular disease, unspecified: Secondary | ICD-10-CM

## 2022-12-14 HISTORY — PX: IR RADIOLOGIST EVAL & MGMT: IMG5224

## 2022-12-14 NOTE — Progress Notes (Signed)
Chief Complaint: Leg pain   Referring Physician(s): Patel,Kevin P   PCP: Dr. Patterson Hammersmith   History of Present Illness: Jonathan [name] is a 77 y.o. male presenting as a scheduled follow up to [facility] clinic, with history of lower extremity pain.    Jonathan [name] joins [place] today with his wife in the clinic.     Hx:  He was referred to [place] 07/12/21 having right greater than left foot pain and calf pain, present for about 6 months that is occurring daily.  He describes pain when he walks short distances routinely.  He also is describing pain at night in his feet, that routinely wakes him up nightly.  The only way that he can make the pain better at night time is by getting out of bed and walking around the house.  He describes clearly the right foot is the side with worse pain at night time.    No history of prior wound.  He sometimes takes some tylenol for the pain.    He denies resting chest pain or history of MI.  He says he had a clean heart catheterization in [place] about 20 years ago.  He says he was treated for a "small stroke" about 20 years ago in [place].     CV risk factors: HTN, CVD, DM   Interval Hx:  We say Jonathan [name] last 11/24/21.   Since then he has undergone CABG at [facility], performed 07/07/22, with 5 vessel CABG, for severe disease.  He tells me that he is feeling fine after surgery.  .    Regarding his lower extremity pain, he tells me that he no longer has pain in his left foot at night waking him  He is able to sleep comfortably each night, laying flat in bed.  He does not have any current lower extremity wound.   He is able to walk around his house, and to his mailbox down his driveway comfortably, without pain.  He uses a walker for support and balance.    04/20/22 he underwent angiogram and attempt at left tibial reconstruction. His last non-invasive was 06/29/21, which shows evidence of tibial disease.  Right ABI was 0.65, left was 0.64.      Past Medical  History:  Diagnosis Date   Arthritis    ra, sees dr Alean Rinne   Chronic kidney disease    Coronary artery disease    saw dr Armanda Magic in [place] 20 yrs ago, released from cardiology   Diabetes mellitus without complication [facility])    type 2   Dyspnea 07/2022   Lately r/t CAD   GERD (gastroesophageal reflux disease)    Hypercholesteremia    Hypertension    Peripheral vascular disease (HCC)    Stroke (HCC) 2007   "Light Stroke" per pt. No deficits   Urethral stricture     Past Surgical History:  Procedure Laterality Date   ABDOMINAL AORTOGRAM W/LOWER EXTREMITY N/A 04/20/2022   Procedure: ABDOMINAL AORTOGRAM W/LOWER EXTREMITY;  Surgeon: Iran Ouch, MD;  Location: [facility];  Service: Cardiovascular;  Laterality: N/A;   BACK SURGERY     x 3 lower back   CARDIAC CATHETERIZATION  2000   small amt plaque relased from cardiology 20 yrs ago   CHOLECYSTECTOMY     COLONOSCOPY WITH PROPOFOL N/A 10/26/2021   Procedure: COLONOSCOPY WITH PROPOFOL;  Surgeon: Lanelle Bal, DO;  [facility]: [facility];  Service: Endoscopy;  Laterality:  N/A;  To arrive at 1200   CORONARY ARTERY BYPASS GRAFT N/A 07/07/2022   Procedure: CORONARY ARTERY BYPASS GRAFTING (CABG) X5 USING OPEN LEFT INTERNAL MAMMARY ARTERY AND ENDOSCOPICALLY HARVESTED RIGHT GREATER SAPHENOUS VEIN;  Surgeon: Alleen Borne, MD;  Location: [facility] OR;  Service: Open Heart Surgery;  Laterality: N/A;   CYSTOSCOPY WITH URETHRAL DILATATION N/A 04/17/2019   Procedure: CYSTOSCOPY WITH URETHRAL DILATATION, CYSTOGRAM;  Surgeon: Rene Paci, MD;  Location: [facility];  Service: Urology;  Laterality: N/A;   EYE SURGERY Bilateral    cataracts   IR RADIOLOGIST EVAL & MGMT  07/12/2021   PERIPHERAL VASCULAR BALLOON ANGIOPLASTY  04/20/2022   Procedure: PERIPHERAL VASCULAR BALLOON ANGIOPLASTY;  Surgeon: Iran Ouch, MD;  Location: [facility];  Service: Cardiovascular;;  aborted; unable to cross    POLYPECTOMY  10/26/2021   Procedure: POLYPECTOMY INTESTINAL;  Surgeon: Lanelle Bal, DO;  [facility]: [facility];  Service: Endoscopy;;   RIGHT/LEFT HEART CATH AND CORONARY ANGIOGRAPHY Bilateral 06/24/2022   Procedure: RIGHT/LEFT HEART CATH AND CORONARY ANGIOGRAPHY;  Surgeon: Iran Ouch, MD;  Location: [facility] INVASIVE CV LAB;  Service: Cardiovascular;  Laterality: Bilateral;   TEE WITHOUT CARDIOVERSION N/A 07/07/2022   Procedure: TRANSESOPHAGEAL ECHOCARDIOGRAM;  Surgeon: Alleen Borne, MD;  Location: [facility] OR;  Service: Open Heart Surgery;  Laterality: N/A;   urethral stricture surgery  15 yrs ago    Allergies: Ciprofloxacin, Mosquito (diagnostic), and Oxycodone-acetaminophen  Medications: Prior to Admission medications   Medication Sig Start Date End Date Taking? Authorizing Provider  acetaminophen (TYLENOL) 325 MG tablet Take 2 tablets (650 mg total) by mouth every 4 (four) hours as needed. 07/12/22   Leary Roca, PA-C  albuterol (VENTOLIN HFA) 108 (90 Base) MCG/ACT inhaler Inhale 1-2 puffs into the lungs every 6 (six) hours as needed for wheezing or shortness of breath.    [provider]  aspirin EC 81 MG tablet Take 81 mg by mouth daily. Swallow whole.    [provider]  cholecalciferol (VITAMIN D3) 25 MCG (1000 UNIT) tablet Take 1,000 Units by mouth daily.    [provider]  cyanocobalamin (VITAMIN B12) 1000 MCG tablet Take 1,000 mcg by mouth daily.    [provider]  finasteride (PROSCAR) 5 MG tablet Take 1 tablet (5 mg total) by mouth daily. 08/31/22   McKenzie, Mardene Celeste, MD  folic acid (FOLVITE) 1 MG tablet Take 1 mg by mouth daily.    [provider]  gabapentin (NEURONTIN) 300 MG capsule Take 1 capsule (300 mg total) by mouth at bedtime. 08/04/22   Anabel Halon, MD  glipiZIDE (GLUCOTROL) 5 MG tablet TAKE 1 TABLET BY MOUTH TWICE DAILY BEFORE A MEAL 10/07/22   Anabel Halon, MD  hydrochlorothiazide (HYDRODIURIL) 25 MG  tablet TAKE 1 TABLET BY MOUTH DAILY 11/02/22   Anabel Halon, MD  hydrocortisone (ANUSOL-HC) 2.5 % rectal cream Place 1 Application rectally 2 (two) times daily. For 10 days. May repeat cycle if needed. 03/16/22   Tiffany Kocher, PA-C  levocetirizine (XYZAL) 5 MG tablet Take 1 tablet (5 mg total) by mouth every evening. 07/22/22   Anabel Halon, MD  linaclotide [facility]) 145 MCG CAPS capsule Take 145 mcg by mouth daily as needed (constipation).    [provider]  Menthol, Topical Analgesic, (BLUE-EMU MAXIMUM STRENGTH EX) Apply 1 Application topically daily as needed (pain).    [provider]  methotrexate (RHEUMATREX) 2.5 MG tablet Take 5  tablets (12.5 mg total) by mouth every Sunday. Resume on Sunday 07/24/22. Caution:Chemotherapy. Protect from light. 07/17/22   Leary Roca, PA-C  metoprolol tartrate (LOPRESSOR) 50 MG tablet TAKE 1 TABLET BY MOUTH TWICE  DAILY 08/29/22   Anabel Halon, MD  omeprazole (PRILOSEC) 20 MG capsule Take 1 capsule (20 mg total) by mouth daily. 05/03/22   Anabel Halon, MD  predniSONE (DELTASONE) 1 MG tablet Take 1 mg by mouth every other day. Takes 1 tablet every other day for 30 days    [provider]  rosuvastatin (CRESTOR) 10 MG tablet TAKE 1 TABLET BY MOUTH DAILY 12/01/21   Anabel Halon, MD  silodosin (RAPAFLO) 8 MG CAPS capsule Take 1 capsule (8 mg total) by mouth in the morning and at bedtime. Patient taking differently: Take 8 mg by mouth daily. 08/31/22   McKenzie, Mardene Celeste, MD  triamcinolone cream (KENALOG) 0.1 % Apply 1 Application topically daily as needed (itching). 11/24/21   Anabel Halon, MD  vitamin E 180 MG (400 UNITS) capsule Take 400 Units by mouth daily.    [provider]     Family History  Problem Relation Age of Onset   Heart Problems Father     Social History   Socioeconomic History   Marital status: Married    Spouse name: Not on file   Number of children: Not on file   Years of  education: Not on file   Highest education level: Not on file  Occupational History   Not on file  Tobacco Use   Smoking status: Never   Smokeless tobacco: Never  Vaping Use   Vaping status: Never Used  Substance and Sexual Activity   Alcohol use: No   Drug use: No   Sexual activity: Yes  Other Topics Concern   Not on file  Social History Narrative   Not on file   Social Determinants of Health   Financial Resource Strain: Low Risk  (11/15/2021)   Overall Financial Resource Strain (CARDIA)    Difficulty of Paying Living Expenses: Not hard at all  Food Insecurity: No Food Insecurity (07/11/2022)   Hunger Vital Sign    Worried About Running Out of Food in the Last Year: Never true    Ran Out of Food in the Last Year: Never true  Transportation Needs: No Transportation Needs (07/11/2022)   PRAPARE - Administrator, Civil Service (Medical): No    Lack of Transportation (Non-Medical): No  Physical Activity: Sufficiently Active (11/15/2021)   Exercise Vital Sign    Days of Exercise per Week: 5 days    Minutes of Exercise per Session: 30 min  Stress: No Stress Concern Present (11/15/2021)   [employer] of Occupational Health - Occupational Stress Questionnaire    Feeling of Stress : Not at all  Social Connections: Moderately Integrated (11/15/2021)   Social Connection and Isolation Panel [NHANES]    Frequency of Communication with Friends and Family: Three times a week    Frequency of Social Gatherings with Friends and Family: Twice a week    Attends Religious Services: More than 4 times per year    Active Member of [employer] or Organizations: No    Attends Banker Meetings: Never    Marital Status: Married       Review of Systems: A 12 point ROS discussed and pertinent positives are indicated in the HPI above.  All other systems are negative.  Review of Systems  Vital Signs: BP (!) 188/76   Pulse 66   Temp 98.2 F (36.8 C)   Resp 18   SpO2 100%    Advance Care Plan: The advanced care plan/surrogate decision maker was discussed at the time of visit and documented in the medical record.    Physical Exam General: 77 yo male appearing stated age.  Well-developed, well-nourished.  No distress. HEENT: Atraumatic, normocephalic.  Conjugate gaze, extra-ocular motor intact. No scleral icterus or scleral injection. No lesions on external ears, nose, lips, or gums.  Oral mucosa moist, pink.  Neck: Symmetric with no goiter enlargement.  Chest/Lungs:  Symmetric chest with inspiration/expiration.  No labored breathing.  Clear to auscultation with no wheezes, rhonchi, or rales.  Heart:  RRR, with no third heart sounds appreciated. No JVD appreciated.  Abdomen:  Soft, NT/ND, with + bowel sounds.   Genito-urinary: Deferred Neurologic: Alert & Oriented to person, place, and time.   Normal affect and insight.  Appropriate questions.  Moving all 4 extremities with gross sensory intact.  Pulse Exam:  + doppler at the right AT, negative PT, + doppler at the left PT, negative AT Extremities: No wound.  Firm edema bilateral ankles.  No discoloration. No intertriginous wounds.   Mallampati Score:     Imaging: No results found.  Labs:  CBC: Recent Labs    07/08/22 1810 07/09/22 0446 07/10/22 0301 07/21/22 0919  WBC 12.7* 9.7 11.0* 6.9  HGB 9.1* 8.6* 8.3* 9.6*  HCT 28.8* 26.1* 26.6* 30.3*  PLT 141* 125* 132* 254    COAGS: Recent Labs    07/05/22 1627 07/07/22 1425  INR 1.1 1.4*  APTT 30 35    BMP: Recent Labs    07/09/22 1531 07/10/22 0301 07/11/22 0050 07/12/22 0114 07/21/22 0919  NA 128* 129* 129* 133* 138  K 3.8 4.2 4.4 4.5 4.7  CL 99 98 99 105 100  CO2 24 21* 21* 22 21  GLUCOSE 146* 69* 88 161* 93  BUN 25* 27* 33* 30* 18  CALCIUM 7.6* 7.9* 7.9* 7.8* 9.0  CREATININE 1.79* 1.74* 2.15* 1.84* 1.39*  GFRNONAA 39* 41* 32* 38*  --     LIVER FUNCTION TESTS: Recent Labs    07/05/22 1627 07/21/22 0919  BILITOT 0.7 0.4   AST 30 38  ALT 24 27  ALKPHOS 80 126*  PROT 7.7 7.6  ALBUMIN 3.0* 3.5*    TUMOR MARKERS: No results for input(s): "AFPTM", "CEA", "CA199", "CHROMGRNA" in the last 8760 hours.     Assessment:  Jonathan Lahrman is a 77yo male with lower extremity PAD, previous symptoms of [place] 4/rest pain.    Non-invasive lower extremity exam and prior angiogram shows evidence of distal fem-pop disease and advanced tibial disease.     Today Jonathan Jankiewicz is asymptomatic, with no resting pain, and no wounds of the lower extremity.  At this time, he is recovering from CABG in March, and is probably not ambulating far enough to have claudication.     At this time, there is no indication for any aggressive intervention.  He understands and agrees.    Maximal medical management should continue as recommended by updated AHA guidelines1.  This includes anti-platelet medication, tight blood glucose control to a HbA1c < 7, tight blood pressure control, maximum-dose HMG-CoA reductase inhibitor, and smoking cessation.    Annual flu vaccination is also recommended, with Class 1 recommendation1.    Plan: - Continue max medical therapy.   - We will set up a 1  year follow up clinic visit.  No reason for surveillance imaging at this time.  -  We are happy to see him back earlier if he has any worsening of symptoms/recurrent rest pain, or a new wound.   -Recommend continue maximal medical therapy for cardiovascular risk reduction, including anti-platelet therapy.    Electronically Signed: Gilmer Mor 12/14/2022, 9:36 AM   I spent a total of    40 Minutes in face to face in clinical consultation, greater than 50% of which was counseling/coordinating care for PAD, bilateral tibial disease, possible intervention

## 2022-12-16 ENCOUNTER — Other Ambulatory Visit (HOSPITAL_COMMUNITY): Payer: 59

## 2022-12-16 ENCOUNTER — Encounter (HOSPITAL_COMMUNITY): Payer: Self-pay

## 2022-12-16 ENCOUNTER — Ambulatory Visit (HOSPITAL_COMMUNITY)
Admission: RE | Admit: 2022-12-16 | Discharge: 2022-12-16 | Disposition: A | Discharge status: Home / Self Care | Payer: 59 | Source: Ambulatory Visit | Attending: Family Medicine | Admitting: Family Medicine

## 2022-12-16 DIAGNOSIS — R1310 Dysphagia, unspecified: Secondary | ICD-10-CM | POA: Diagnosis not present

## 2022-12-21 ENCOUNTER — Ambulatory Visit: Payer: 59 | Admitting: Urology

## 2022-12-21 VITALS — BP 169/72 | HR 76

## 2022-12-21 DIAGNOSIS — R351 Nocturia: Secondary | ICD-10-CM

## 2022-12-21 DIAGNOSIS — N400 Enlarged prostate without lower urinary tract symptoms: Secondary | ICD-10-CM | POA: Diagnosis not present

## 2022-12-21 LAB — URINALYSIS, ROUTINE W REFLEX MICROSCOPIC
Bilirubin, UA: NEGATIVE
Glucose, UA: NEGATIVE
Ketones, UA: NEGATIVE
Leukocytes,UA: NEGATIVE
Nitrite, UA: NEGATIVE
Protein,UA: NEGATIVE
Specific Gravity, UA: 1.01 (ref 1.005–1.030)
Urobilinogen, Ur: 0.2 mg/dL (ref 0.2–1.0)
pH, UA: 6 (ref 5.0–7.5)

## 2022-12-21 LAB — BLADDER SCAN AMB NON-IMAGING: Scan Result: 173

## 2022-12-21 LAB — MICROSCOPIC EXAMINATION: Bacteria, UA: NONE SEEN

## 2022-12-21 MED ORDER — SILODOSIN 8 MG PO CAPS
8.0000 mg | ORAL_CAPSULE | Freq: Every day | ORAL | 3 refills | Status: DC
Start: 2022-12-21 — End: 2023-06-02

## 2022-12-21 MED ORDER — FINASTERIDE 5 MG PO TABS: 5.0000 mg | ORAL_TABLET | Freq: Every day | ORAL | 3 refills | Status: DC

## 2022-12-21 NOTE — Progress Notes (Signed)
post void residual= 173

## 2022-12-21 NOTE — Progress Notes (Signed)
12/21/2022 9:53 AM   Jonathan [name] 1945-05-12 [id number]  Referring provider: Anabel Halon, MD [address] [place],  [place] [address]  Followup BPh and nocturia   HPI: Jonathan [name] is a 77yo here for followup for BPh and nocturia. IPSS 12 QOL 4 on rapaflo 8mg  BID and finasteride. Urine stream strong. No straining to urinate. Nocturia 2-3x. NO other complaints today   PMH: Past Medical History:  Diagnosis Date   Arthritis    ra, sees dr Alean Rinne   Chronic kidney disease    Coronary artery disease    saw dr Armanda Magic in [place] 20 yrs ago, released from cardiology   Diabetes mellitus without complication [facility])    type 2   Dyspnea 07/2022   Lately r/t CAD   GERD (gastroesophageal reflux disease)    Hypercholesteremia    Hypertension    Peripheral vascular disease (HCC)    Stroke (HCC) 2007   "Light Stroke" per pt. No deficits   Urethral stricture     Surgical History: Past Surgical History:  Procedure Laterality Date   ABDOMINAL AORTOGRAM W/LOWER EXTREMITY N/A 04/20/2022   Procedure: ABDOMINAL AORTOGRAM W/LOWER EXTREMITY;  Surgeon: Iran Ouch, MD;  [facility];  Service: Cardiovascular;  Laterality: N/A;   BACK SURGERY     x 3 lower back   CARDIAC CATHETERIZATION  2000   small amt plaque relased from cardiology 20 yrs ago   CHOLECYSTECTOMY     COLONOSCOPY WITH PROPOFOL N/A 10/26/2021   Procedure: COLONOSCOPY WITH PROPOFOL;  Surgeon: Lanelle Bal, DO;  [facility];  Service: Endoscopy;  Laterality: N/A;  To arrive at 1200   CORONARY ARTERY BYPASS GRAFT N/A 07/07/2022   Procedure: CORONARY ARTERY BYPASS GRAFTING (CABG) X5 USING OPEN LEFT INTERNAL MAMMARY ARTERY AND ENDOSCOPICALLY HARVESTED RIGHT GREATER SAPHENOUS VEIN;  Surgeon: Alleen Borne, MD;  [facility]: [facility];  Service: Open Heart Surgery;  Laterality: N/A;   CYSTOSCOPY WITH URETHRAL DILATATION N/A 04/17/2019   Procedure: CYSTOSCOPY WITH URETHRAL DILATATION,  CYSTOGRAM;  Surgeon: Rene Paci, MD;  Location[facility];  Service: Urology;  Laterality: N/A;   EYE SURGERY Bilateral    cataracts   IR RADIOLOGIST EVAL & MGMT  07/12/2021   IR RADIOLOGIST EVAL & MGMT  12/14/2022   PERIPHERAL VASCULAR BALLOON ANGIOPLASTY  04/20/2022   Procedure: PERIPHERAL VASCULAR BALLOON ANGIOPLASTY;  Surgeon: Iran Ouch, MD;  [facility];  Service: Cardiovascular;;  aborted; unable to cross   POLYPECTOMY  10/26/2021   Procedure: POLYPECTOMY INTESTINAL;  Surgeon: Lanelle Bal, DO;  [facility];  Service: Endoscopy;;   RIGHT/LEFT HEART CATH AND CORONARY ANGIOGRAPHY Bilateral 06/24/2022   Procedure: RIGHT/LEFT HEART CATH AND CORONARY ANGIOGRAPHY;  Surgeon: Iran Ouch, MD;  Location: [facility] INVASIVE CV LAB;  Service: Cardiovascular;  Laterality: Bilateral;   TEE WITHOUT CARDIOVERSION N/A 07/07/2022   Procedure: TRANSESOPHAGEAL ECHOCARDIOGRAM;  Surgeon: Alleen Borne, MD;  Location: [facility] OR;  Service: Open Heart Surgery;  Laterality: N/A;   urethral stricture surgery  15 yrs ago    Home Medications:  Allergies as of 12/21/2022       Reactions   Ciprofloxacin Itching   Mosquito (diagnostic) Itching   Oxycodone-acetaminophen Rash        Medication List        Accurate as of December 21, 2022  9:53 AM. If you have any questions, ask your nurse or doctor.  acetaminophen 325 MG tablet Commonly known as: TYLENOL Take 2 tablets (650 mg total) by mouth every 4 (four) hours as needed.   albuterol 108 (90 Base) MCG/ACT inhaler Commonly known as: VENTOLIN HFA Inhale 1-2 puffs into the lungs every 6 (six) hours as needed for wheezing or shortness of breath.   aspirin EC 81 MG tablet Take 81 mg by mouth daily. Swallow whole.   BLUE-EMU MAXIMUM STRENGTH EX Apply 1 Application topically daily as needed (pain).   cholecalciferol 25 MCG (1000 UNIT) tablet Commonly known as: VITAMIN  D3 Take 1,000 Units by mouth daily.   cyanocobalamin 1000 MCG tablet Commonly known as: VITAMIN B12 Take 1,000 mcg by mouth daily.   finasteride 5 MG tablet Commonly known as: PROSCAR Take 1 tablet (5 mg total) by mouth daily.   folic acid 1 MG tablet Commonly known as: FOLVITE Take 1 mg by mouth daily.   gabapentin 300 MG capsule Commonly known as: NEURONTIN Take 1 capsule (300 mg total) by mouth at bedtime.   glipiZIDE 5 MG tablet Commonly known as: GLUCOTROL TAKE 1 TABLET BY MOUTH TWICE DAILY BEFORE A MEAL   hydrochlorothiazide 25 MG tablet Commonly known as: HYDRODIURIL TAKE 1 TABLET BY MOUTH DAILY   hydrocortisone 2.5 % rectal cream Commonly known as: ANUSOL-HC Place 1 Application rectally 2 (two) times daily. For 10 days. May repeat cycle if needed.   levocetirizine 5 MG tablet Commonly known as: XYZAL Take 1 tablet (5 mg total) by mouth every evening.   Linzess 145 MCG Caps capsule Generic drug: linaclotide Take 145 mcg by mouth daily as needed (constipation).   methotrexate 2.5 MG tablet Commonly known as: RHEUMATREX Take 5 tablets (12.5 mg total) by mouth every Sunday. Resume on Sunday 07/24/22. Caution:Chemotherapy. Protect from light.   metoprolol tartrate 50 MG tablet Commonly known as: LOPRESSOR TAKE 1 TABLET BY MOUTH TWICE  DAILY   omeprazole 20 MG capsule Commonly known as: PRILOSEC Take 1 capsule (20 mg total) by mouth daily.   predniSONE 1 MG tablet Commonly known as: DELTASONE Take 1 mg by mouth every other day. Takes 1 tablet every other day for 30 days   rosuvastatin 10 MG tablet Commonly known as: CRESTOR TAKE 1 TABLET BY MOUTH DAILY   silodosin 8 MG Caps capsule Commonly known as: RAPAFLO Take 1 capsule (8 mg total) by mouth in the morning and at bedtime. What changed: when to take this   triamcinolone cream 0.1 % Commonly known as: KENALOG Apply 1 Application topically daily as needed (itching).   vitamin E 180 MG (400 UNITS)  capsule Take 400 Units by mouth daily.        Allergies:  Allergies  Allergen Reactions   Ciprofloxacin Itching   Mosquito (Diagnostic) Itching   Oxycodone-Acetaminophen Rash    Family History: Family History  Problem Relation Age of Onset   Heart Problems Father     Social History:  reports that he has never smoked. He has never used smokeless tobacco. He reports that he does not drink alcohol and does not use drugs.  ROS: All other review of systems were reviewed and are negative except what is noted above in HPI  Physical Exam: BP (!) 169/72   Pulse 76   Constitutional:  Alert and oriented, No acute distress. HEENT: [place] AT, moist mucus membranes.  Trachea midline, no masses. Cardiovascular: No clubbing, cyanosis, or edema. Respiratory: Normal respiratory effort, no increased work of breathing. GI: Abdomen is soft, nontender, nondistended, no abdominal  masses GU: No CVA tenderness.  Lymph: No cervical or inguinal lymphadenopathy. Skin: No rashes, bruises or suspicious lesions. Neurologic: Grossly intact, no focal deficits, moving all 4 extremities. Psychiatric: Normal mood and affect.  Laboratory Data: Lab Results  Component Value Date   WBC 6.9 07/21/2022   HGB 9.6 (L) 07/21/2022   HCT 30.3 (L) 07/21/2022   MCV 90 07/21/2022   PLT 254 07/21/2022    Lab Results  Component Value Date   CREATININE 1.39 (H) 07/21/2022    No results found for: "PSA"  No results found for: "TESTOSTERONE"  Lab Results  Component Value Date   HGBA1C 6.1 (H) 07/05/2022    Urinalysis    Component Value Date/Time   COLORURINE YELLOW 07/05/2022 1651   APPEARANCEUR Clear 08/31/2022 1037   LABSPEC 1.011 07/05/2022 1651   PHURINE 5.0 07/05/2022 1651   GLUCOSEU Negative 08/31/2022 1037   HGBUR MODERATE (A) 07/05/2022 1651   HGBUR negative 04/16/2007 0000   BILIRUBINUR Negative 08/31/2022 1037   KETONESUR NEGATIVE 07/05/2022 1651   PROTEINUR Trace 08/31/2022 1037    PROTEINUR NEGATIVE 07/05/2022 1651   UROBILINOGEN 0.2 12/28/2020 1532   UROBILINOGEN negative 04/16/2007 0000   NITRITE Negative 08/31/2022 1037   NITRITE NEGATIVE 07/05/2022 1651   LEUKOCYTESUR Negative 08/31/2022 1037   LEUKOCYTESUR NEGATIVE 07/05/2022 1651    Lab Results  Component Value Date   LABMICR See below: 08/31/2022   WBCUA 0-5 08/31/2022   LABEPIT 0-10 08/31/2022   MUCUS Present 05/12/2021   BACTERIA None seen 08/31/2022    Pertinent Imaging:  No results found for this or any previous visit.  No results found for this or any previous visit.  No results found for this or any previous visit.  No results found for this or any previous visit.  Results for orders placed during the hospital encounter of 12/17/20  US Renal  Narrative CLINICAL DATA:  Recurrent UTI in a 77 year old male.  EXAM: RENAL / URINARY TRACT ULTRASOUND COMPLETE  COMPARISON:  More remote studies dating back to 2005.  FINDINGS: Right Kidney:  Renal measurements: 9.5 x 4.0 x 5.1 cm = volume: 100.1 mL. Limited exam due to body habitus, no hydronephrosis. Mild parenchymal thinning.  Left Kidney:  Renal measurements: 9.8 x 5.2 x 5.2 cm = volume: 138.8 mL. Limited exam due to overlying bowel gas and body habitus. No hydronephrosis. Mild parenchymal thinning. Small cyst arising from the lower pole measuring 1.2 x 1.0 x 1.0 cm.  Bladder:  Appears normal for degree of bladder distention.  Other:  Increased hepatic echogenicity.  IMPRESSION: Mild cortical thinning without signs of hydronephrosis. Limited exam due to body habitus and bowel gas.  Small LEFT lower pole cyst.  Hepatic steatosis.   Electronically Signed By: Donzetta Kohut M.D. On: 12/18/2020 14:12  No valid procedures specified. Results for orders placed during the hospital encounter of 03/24/21  CT HEMATURIA WORKUP  Narrative CLINICAL DATA:  Microhematuria  EXAM: CT ABDOMEN AND PELVIS WITHOUT AND WITH  CONTRAST  TECHNIQUE: Multidetector CT imaging of the abdomen and pelvis was performed following the standard protocol before and following the bolus administration of intravenous contrast.  CONTRAST:  OMNIPAQUE IOHEXOL 350 MG/ML SOLN  COMPARISON:  None.  FINDINGS: Lower chest: No acute abnormality. Bandlike scarring of the bilateral lung bases.  Hepatobiliary: No focal liver abnormality is seen. Status post cholecystectomy. No biliary dilatation.  Pancreas: Unremarkable. No pancreatic ductal dilatation or surrounding inflammatory changes.  Spleen: Normal in size without significant abnormality.  Adrenals/Urinary Tract: Adrenal glands are unremarkable. Occasional small bilateral renal cysts. Kidneys are otherwise normal, without renal calculi, solid lesion, or hydronephrosis. No evidence of urinary tract filling defect on delayed phase imaging. Thickening of the urinary bladder wall. Diverticulum of the anterior bladder dome (series 7, image 74, series 12, image 69).  Stomach/Bowel: Stomach is within normal limits. Appendix appears normal. No evidence of bowel wall thickening, distention, or inflammatory changes.  Vascular/Lymphatic: Aortic atherosclerosis. No enlarged abdominal or pelvic lymph nodes.  Reproductive: Prostatomegaly with median lobe hypertrophy.  Other: No abdominal wall hernia or abnormality. No abdominopelvic ascites.  Musculoskeletal: No acute or significant osseous findings.  IMPRESSION: 1. No evidence of urinary tract calculus, mass, or hydronephrosis. No evidence of urinary tract filling defect on delayed phase imaging. 2. Thickening of the urinary bladder wall, likely related to chronic outlet obstruction although infectious or inflammatory cystitis is a differential consideration in the setting of hematuria. 3. Prostatomegaly.  Aortic Atherosclerosis (ICD10-I70.0).   Electronically Signed By: Jearld Lesch M.D. On: 03/26/2021  10:13  No results found for this or any previous visit.   Assessment & Plan:    1. Benign prostatic hyperplasia, unspecified whether lower urinary tract symptoms present Continue rapaflo and finasteride - BLADDER SCAN AMB NON-IMAGING - Urinalysis, Routine w reflex microscopic  2. Nocturia -continue rapaflo 8mg  BID and finasteride   No follow-ups on file.  Wilkie Aye, MD  [facility] [place]

## 2022-12-27 ENCOUNTER — Encounter: Payer: Self-pay | Admitting: Urology

## 2022-12-27 NOTE — Patient Instructions (Signed)

## 2022-12-29 ENCOUNTER — Ambulatory Visit: Payer: 59 | Admitting: Cardiovascular Disease

## 2022-12-29 ENCOUNTER — Encounter: Payer: Self-pay | Admitting: Cardiovascular Disease

## 2022-12-29 VITALS — BP 100/64 | HR 70 | Ht 69.0 in | Wt 217.4 lb

## 2022-12-29 DIAGNOSIS — I2510 Atherosclerotic heart disease of native coronary artery without angina pectoris: Secondary | ICD-10-CM | POA: Diagnosis not present

## 2022-12-29 DIAGNOSIS — E785 Hyperlipidemia, unspecified: Secondary | ICD-10-CM | POA: Diagnosis not present

## 2022-12-29 DIAGNOSIS — I739 Peripheral vascular disease, unspecified: Secondary | ICD-10-CM | POA: Diagnosis not present

## 2022-12-29 DIAGNOSIS — I10 Essential (primary) hypertension: Secondary | ICD-10-CM | POA: Diagnosis not present

## 2022-12-29 DIAGNOSIS — I5022 Chronic systolic (congestive) heart failure: Secondary | ICD-10-CM

## 2022-12-29 DIAGNOSIS — I25118 Atherosclerotic heart disease of native coronary artery with other forms of angina pectoris: Secondary | ICD-10-CM

## 2022-12-29 MED ORDER — CLOPIDOGREL BISULFATE 75 MG PO TABS: 75.0000 mg | ORAL_TABLET | Freq: Every day | ORAL | 3 refills | Status: DC

## 2022-12-29 MED ORDER — METOPROLOL SUCCINATE ER 100 MG PO TB24: 100.0000 mg | ORAL_TABLET | Freq: Every day | ORAL | 3 refills | Status: DC

## 2022-12-29 MED ORDER — ROSUVASTATIN CALCIUM 20 MG PO TABS
20.0000 mg | ORAL_TABLET | Freq: Every day | ORAL | 3 refills | Status: DC
Start: 2022-12-29 — End: 2023-09-08

## 2022-12-29 NOTE — Progress Notes (Signed)
Cardiology Office Note   Date:  12/29/2022   ID:  Jonathan [name], DOB April 28, 1946, MRN [id number]  PCP:  Anabel Halon, MD  Cardiologist:   Lorine Bears, MD   Chief Complaint  Patient presents with   3 month follow up     Patient c/o a "pin sticking" sensation at the CABG incision site. Medications reviewed by the patient verbally.       History of Present Illness: Jonathan [name] is a 77 y.o. male who is here today for follow-up visit regarding peripheral arterial disease and coronary artery disease status post recent CABG. He has past medical history of essential hypertension, diabetes mellitus, GERD, rheumatoid arthritis, chronic kidney disease and obesity.   He is followed for peripheral arterial disease mainly due to below the knee disease. Angiography was done in December 2023 and showed no significant aortoiliac disease, on the left side, there was mild to moderate SFA stenosis and no significant popliteal disease.  All tibial and peroneal vessels were occluded with reconstitution of the anterior tibial artery and posterior tibial artery via collaterals in the distal portion just above the ankle.  I attempted angioplasty of the anterior tibial artery antegrade and retrograde but was not able to cross due to long calcified occlusion.  I consulted Dr. Karin Lieu for bypass but the patient was found to have borderline quality vein conduits.  He had worsening angina in early 2024.  Cardiac catheterization was done in February of this year which showed severe three-vessel coronary artery disease.  Right heart catheterization showed moderately elevated wedge pressure and pulmonary pressure. He underwent CABG on March 7 with LIMA to LAD, SVG to diagonal, SVG to OM1/OM 2 and SVG to right PDA.  He has been doing reasonably well with no chest pain or worsening dyspnea.  Left leg rest pain improved significantly.  Past Medical History:  Diagnosis Date   Arthritis    ra, sees dr Alean Rinne   Chronic kidney disease    Coronary artery disease    saw dr Armanda Magic in [place] 20 yrs ago, released from cardiology   Diabetes mellitus without complication [facility])    type 2   Dyspnea 07/2022   Lately r/t CAD   GERD (gastroesophageal reflux disease)    Hypercholesteremia    Hypertension    Peripheral vascular disease (HCC)    Stroke (HCC) 2007   "Light Stroke" per pt. No deficits   Urethral stricture     Past Surgical History:  Procedure Laterality Date   ABDOMINAL AORTOGRAM W/LOWER EXTREMITY N/A 04/20/2022   Procedure: ABDOMINAL AORTOGRAM W/LOWER EXTREMITY;  Surgeon: Iran Ouch, MD;  [facility];  Service: Cardiovascular;  Laterality: N/A;   BACK SURGERY     x 3 lower back   CARDIAC CATHETERIZATION  2000   small amt plaque relased from cardiology 20 yrs ago   CHOLECYSTECTOMY     COLONOSCOPY WITH PROPOFOL N/A 10/26/2021   Procedure: COLONOSCOPY WITH PROPOFOL;  Surgeon: Lanelle Bal, DO;  [facility];  Service: Endoscopy;  Laterality: N/A;  To arrive at 1200   CORONARY ARTERY BYPASS GRAFT N/A 07/07/2022   Procedure: CORONARY ARTERY BYPASS GRAFTING (CABG) X5 USING OPEN LEFT INTERNAL MAMMARY ARTERY AND ENDOSCOPICALLY HARVESTED RIGHT GREATER SAPHENOUS VEIN;  Surgeon: Alleen Borne, MD;  [facility];  Service: Open Heart Surgery;  Laterality: N/A;   CYSTOSCOPY WITH URETHRAL DILATATION N/A 04/17/2019   Procedure: CYSTOSCOPY WITH URETHRAL DILATATION,  CYSTOGRAM;  Surgeon: Rene Paci, MD;  [facility];  Service: Urology;  Laterality: N/A;   EYE SURGERY Bilateral    cataracts   IR RADIOLOGIST EVAL & MGMT  07/12/2021   IR RADIOLOGIST EVAL & MGMT  12/14/2022   PERIPHERAL VASCULAR BALLOON ANGIOPLASTY  04/20/2022   Procedure: PERIPHERAL VASCULAR BALLOON ANGIOPLASTY;  Surgeon: Iran Ouch, MD;  [facility];  Service: Cardiovascular;;  aborted; unable to cross   POLYPECTOMY   10/26/2021   Procedure: POLYPECTOMY INTESTINAL;  Surgeon: Lanelle Bal, DO;  [facility];  Service: Endoscopy;;   RIGHT/LEFT HEART CATH AND CORONARY ANGIOGRAPHY Bilateral 06/24/2022   Procedure: RIGHT/LEFT HEART CATH AND CORONARY ANGIOGRAPHY;  Surgeon: Iran Ouch, MD;  Location: [facility] INVASIVE CV LAB;  Service: Cardiovascular;  Laterality: Bilateral;   TEE WITHOUT CARDIOVERSION N/A 07/07/2022   Procedure: TRANSESOPHAGEAL ECHOCARDIOGRAM;  Surgeon: Alleen Borne, MD;  Location: [facility] OR;  Service: Open Heart Surgery;  Laterality: N/A;   urethral stricture surgery  15 yrs ago     Current Outpatient Medications  Medication Sig Dispense Refill   acetaminophen (TYLENOL) 325 MG tablet Take 2 tablets (650 mg total) by mouth every 4 (four) hours as needed.     albuterol (VENTOLIN HFA) 108 (90 Base) MCG/ACT inhaler Inhale 1-2 puffs into the lungs every 6 (six) hours as needed for wheezing or shortness of breath.     aspirin EC 81 MG tablet Take 81 mg by mouth daily. Swallow whole.     cholecalciferol (VITAMIN D3) 25 MCG (1000 UNIT) tablet Take 1,000 Units by mouth daily.     cyanocobalamin (VITAMIN B12) 1000 MCG tablet Take 1,000 mcg by mouth daily.     finasteride (PROSCAR) 5 MG tablet Take 1 tablet (5 mg total) by mouth daily. 90 tablet 3   folic acid (FOLVITE) 1 MG tablet Take 1 mg by mouth daily.     gabapentin (NEURONTIN) 300 MG capsule Take 1 capsule (300 mg total) by mouth at bedtime. 30 capsule 5   glipiZIDE (GLUCOTROL) 5 MG tablet TAKE 1 TABLET BY MOUTH TWICE DAILY BEFORE A MEAL 30 tablet 0   hydrochlorothiazide (HYDRODIURIL) 25 MG tablet TAKE 1 TABLET BY MOUTH DAILY 100 tablet 2   hydrocortisone (ANUSOL-HC) 2.5 % rectal cream Place 1 Application rectally 2 (two) times daily. For 10 days. May repeat cycle if needed. 30 g 1   levocetirizine (XYZAL) 5 MG tablet Take 1 tablet (5 mg total) by mouth every evening. 30 tablet 5   linaclotide (LINZESS) 145 MCG CAPS capsule Take 145  mcg by mouth daily as needed (constipation).     Menthol, Topical Analgesic, (BLUE-EMU MAXIMUM STRENGTH EX) Apply 1 Application topically daily as needed (pain).     methotrexate (RHEUMATREX) 2.5 MG tablet Take 5 tablets (12.5 mg total) by mouth every Sunday. Resume on Sunday 07/24/22. Caution:Chemotherapy. Protect from light. 5 tablet 0   metoprolol tartrate (LOPRESSOR) 50 MG tablet TAKE 1 TABLET BY MOUTH TWICE  DAILY 200 tablet 2   omeprazole (PRILOSEC) 20 MG capsule Take 1 capsule (20 mg total) by mouth daily. 30 capsule 5   predniSONE (DELTASONE) 1 MG tablet Take 1 mg by mouth every other day. Takes 1 tablet every other day for 30 days     rosuvastatin (CRESTOR) 10 MG tablet TAKE 1 TABLET BY MOUTH DAILY 90 tablet 3   silodosin (RAPAFLO) 8 MG CAPS capsule Take 1 capsule (8 mg total) by mouth at  bedtime. 90 capsule 3   triamcinolone cream (KENALOG) 0.1 % Apply 1 Application topically daily as needed (itching). 80 g 1   vitamin E 180 MG (400 UNITS) capsule Take 400 Units by mouth daily.     No current facility-administered medications for this visit.    Allergies:   Ciprofloxacin, Mosquito (diagnostic), and Oxycodone-acetaminophen    Social History:  The patient  reports that he has never smoked. He has never used smokeless tobacco. He reports that he does not drink alcohol and does not use drugs.   Family History:  The patient's family history includes Heart Problems in his father.    ROS:  Please see the history of present illness.   Otherwise, review of systems are positive for none.   All other systems are reviewed and negative.    PHYSICAL EXAM: VS:  BP 100/64 (BP Location: Left Arm, Patient Position: Sitting, Cuff Size: Normal)   Pulse 70   Ht 5\' 9"  (1.753 m)   Wt 217 lb 6 oz (98.6 kg)   SpO2 95%   BMI 32.10 kg/m  , BMI Body mass index is 32.1 kg/m. GEN: Well nourished, well developed, in no acute distress  HEENT: normal  Neck: no JVD, carotid bruits, or masses Cardiac:  RRR; no murmurs, rubs, or gallops, mild bilateral leg edema. Respiratory:  clear to auscultation bilaterally, normal work of breathing GI: soft, nontender, nondistended, + BS MS: no deformity or atrophy  Skin: warm and dry, no rash Neuro:  Strength and sensation are intact Psych: euthymic mood, full affect    EKG:  EKG is not ordered today.   Recent Labs: 07/08/2022: Magnesium 2.6 07/21/2022: ALT 27; BUN 18; Creatinine, Ser 1.39; Hemoglobin 9.6; Platelets 254; Potassium 4.7; Sodium 138    Lipid Panel    Component Value Date/Time   CHOL 118 07/21/2022 0919   TRIG 91 07/21/2022 0919   HDL 41 07/21/2022 0919   CHOLHDL 2.9 07/21/2022 0919   CHOLHDL 2.9 Ratio 07/16/2007 2228   VLDL 20 07/16/2007 2228   LDLCALC 59 07/21/2022 0919      Wt Readings from Last 3 Encounters:  12/29/22 217 lb 6 oz (98.6 kg)  09/30/22 217 lb (98.4 kg)  09/28/22 215 lb 12.8 oz (97.9 kg)          07/08/2021   10:12 AM  PAD Screen  Previous PAD dx? No  Previous surgical procedure? No  Pain with walking? No  Feet/toe relief with dangling? No  Painful, non-healing ulcers? No  Extremities discolored? Yes      ASSESSMENT AND PLAN:  1.  Coronary artery disease involving native coronary arteries status post CABG for severe three-vessel disease and angina.  He did not have myocardial infarction.  He is doing very well at the present time with no recurrent anginal symptoms.  Given his extensive coronary artery disease and peripheral arterial disease, I elected to add clopidogrel to aspirin.  2. Peripheral arterial disease with rest pain: Fortunately, his symptoms improved with resolution of rest pain.  He now has claudication which does not seem to be lifestyle limiting.  Continue medical therapy.  3.  Hyperlipidemia: Most recent lipid profile showed an LDL of 59.  I increased his rosuvastatin to 20 mg daily.  4.  Essential hypertension: Blood pressure is controlled on current medications.  5.   Chronic systolic heart failure: His ejection fraction was 50 to 55%.  I elected to switch metoprolol to tartrate to metoprolol succinate 100 mg once daily.  Will consider switching hydrochlorothiazide to [facility] upon follow-up.     Disposition: Follow-up in 6 months.  Signed,  Lorine Bears, MD  12/29/2022 11:17 AM    [facility]

## 2022-12-29 NOTE — Patient Instructions (Signed)
Medication Instructions:  Your physician recommends the following medication changes.  STOP TAKING: Metoprolol TARTRATE 100 mg  START TAKING: Metoprolol SUCCINATE 100 mg once daily Plavix 75 mg once daily  INCREASE: Crestor 20 mg once daily   *If you need a refill on your cardiac medications before your next appointment, please call your pharmacy*   Lab Work: None If you have labs (blood work) drawn today and your tests are completely normal, you will receive your results only by: MyChart Message (if you have MyChart) OR A paper copy in the mail If you have any lab test that is abnormal or we need to change your treatment, we will call you to review the results.   Testing/Procedures: None   Follow-Up: At [facility], you and your health needs are our priority.  As part of our continuing mission to provide you with exceptional heart care, we have created designated Provider Care Teams.  These Care Teams include your primary Cardiologist (physician) and Advanced Practice Providers (APPs -  Physician Assistants and Nurse Practitioners) who all work together to provide you with the care you need, when you need it.  We recommend signing up for the patient portal called "MyChart".  Sign up information is provided on this After Visit Summary.  MyChart is used to connect with patients for Virtual Visits (Telemedicine).  Patients are able to view lab/test results, encounter notes, upcoming appointments, etc.  Non-urgent messages can be sent to your provider as well.   To learn more about what you can do with MyChart, go to [email or web address].    Your next appointment:   6 month(s)  Provider:   You may see Dr Mady Gemma, or one of the following Advanced Practice Providers on your designated Care Team:   Nicolasa Ducking, NP Eula Listen, PA-C Cadence Fransico Michael, PA-C Charlsie Quest, NP

## 2023-01-16 ENCOUNTER — Ambulatory Visit (INDEPENDENT_AMBULATORY_CARE_PROVIDER_SITE_OTHER): Payer: 59 | Admitting: Podiatry

## 2023-01-16 ENCOUNTER — Encounter: Payer: Self-pay | Admitting: Podiatry

## 2023-01-16 DIAGNOSIS — E1151 Type 2 diabetes mellitus with diabetic peripheral angiopathy without gangrene: Secondary | ICD-10-CM | POA: Diagnosis not present

## 2023-01-16 DIAGNOSIS — B351 Tinea unguium: Secondary | ICD-10-CM

## 2023-01-16 DIAGNOSIS — L84 Corns and callosities: Secondary | ICD-10-CM

## 2023-01-16 DIAGNOSIS — M79675 Pain in left toe(s): Secondary | ICD-10-CM | POA: Diagnosis not present

## 2023-01-16 DIAGNOSIS — M79674 Pain in right toe(s): Secondary | ICD-10-CM

## 2023-01-17 NOTE — Progress Notes (Signed)
Subjective:   Patient ID: Jonathan [name], male   DOB: 77 y.o.   MRN: [id number]   HPI Patient states the fifth toe on the left foot has been so sore and he has had evaluation for revascularization but it does not appear that it would be of benefit    ROS      Objective:  Physical Exam  Vascular status is diminished with the patient having had history of cardiac bypass in a few months ago.  Patient has a distal keratotic lesion digit 5 left very painful when pressed and extends slightly proximal to this area     Assessment:  Difficult condition as vascular is a big part of the problem along with lesion which may be preulcerative     Plan:  H&P reviewed anesthetized 60 mg Xylocaine Marcaine mixture then debrided the distal lesion discussed the possibility for digital amputation even though there would be risk associated with this if this does not improve given the level of pain the patient is experiencing currently

## 2023-02-06 ENCOUNTER — Other Ambulatory Visit: Payer: Self-pay | Admitting: Internal Medicine

## 2023-02-06 DIAGNOSIS — N400 Enlarged prostate without lower urinary tract symptoms: Secondary | ICD-10-CM

## 2023-02-23 ENCOUNTER — Other Ambulatory Visit: Payer: Self-pay | Admitting: Internal Medicine

## 2023-02-23 ENCOUNTER — Ambulatory Visit (HOSPITAL_COMMUNITY): Payer: 59

## 2023-02-23 ENCOUNTER — Ambulatory Visit: Payer: 59 | Admitting: Vascular Surgery

## 2023-02-23 DIAGNOSIS — N400 Enlarged prostate without lower urinary tract symptoms: Secondary | ICD-10-CM

## 2023-02-27 ENCOUNTER — Other Ambulatory Visit: Payer: Self-pay | Admitting: Internal Medicine

## 2023-02-27 DIAGNOSIS — M7989 Other specified soft tissue disorders: Secondary | ICD-10-CM

## 2023-02-27 DIAGNOSIS — M069 Rheumatoid arthritis, unspecified: Secondary | ICD-10-CM

## 2023-03-03 ENCOUNTER — Ambulatory Visit: Payer: 59 | Admitting: Vascular Surgery

## 2023-03-03 ENCOUNTER — Encounter (HOSPITAL_COMMUNITY): Payer: 59

## 2023-03-07 ENCOUNTER — Other Ambulatory Visit: Payer: Self-pay | Admitting: Family Medicine

## 2023-03-07 DIAGNOSIS — R1310 Dysphagia, unspecified: Secondary | ICD-10-CM

## 2023-03-23 ENCOUNTER — Ambulatory Visit (INDEPENDENT_AMBULATORY_CARE_PROVIDER_SITE_OTHER): Payer: 59 | Admitting: Podiatry

## 2023-03-23 ENCOUNTER — Encounter: Payer: Self-pay | Admitting: Podiatry

## 2023-03-23 ENCOUNTER — Ambulatory Visit (INDEPENDENT_AMBULATORY_CARE_PROVIDER_SITE_OTHER): Payer: 59

## 2023-03-23 DIAGNOSIS — M19071 Primary osteoarthritis, right ankle and foot: Secondary | ICD-10-CM

## 2023-03-23 MED ORDER — METHYLPREDNISOLONE 4 MG PO TBPK: ORAL_TABLET | ORAL | 0 refills | Status: DC

## 2023-03-23 NOTE — Progress Notes (Signed)
He presents today chief complaint of a painful red hot swollen midfoot right.  Patient states that has been doing this for the past couple of weeks started out during the daytime cannot relate that it started in the middle of the night or in first thing in the morning.  Relates that he has been using blue emu cream Aspercreme pain patches he says nothing really helps it.  He relates that his diabetes hemoglobin A1c is 6.1 and well-controlled.  Objective: Vital signs stable he is alert oriented x 3 pulses are palpable.  Large red swollen area to the midfoot right.  Radiographs do demonstrate osteoarthritic changes in this area particularly talonavicular joint and the tarsometatarsal joints.  Assessment: Cannot rule out gouty capsulitis or osteoarthritis of this right foot he does have a history of rheumatoid arthritis.  Plan: Discussed etiology pathology conservative surgical therapies at this point offered him an injection he declined to write him a prescription for Sterapred Dosepak to be utilized for the next 6 days.  If does not resolve she will notify us.  I am also requesting blood work complete metabolic panel as well.  CBC

## 2023-03-23 NOTE — Progress Notes (Deleted)
He  Subjective:  Patient ID: Jonathan [name], male    DOB: 11-17-1945,  MRN: [id number] HPI Chief Complaint  Patient presents with   Foot Pain    Dorsal midfoot right - knot, red, swollen x couple weeks, feels pain radiating into toes, tried Blue Emu, aspercreme, and pain patches - no help, diabetic - 6.1    77 y.o. male presents with the above complaint.   ***  Past Medical History:  Diagnosis Date   Arthritis    ra, sees dr Alean Rinne   Chronic kidney disease    Coronary artery disease    saw dr Armanda Magic in [place] 20 yrs ago, released from cardiology   Diabetes mellitus without complication [facility])    type 2   Dyspnea 07/2022   Lately r/t CAD   GERD (gastroesophageal reflux disease)    Hypercholesteremia    Hypertension    Peripheral vascular disease (HCC)    Stroke (HCC) 2007   "Light Stroke" per pt. No deficits   Urethral stricture    Past Surgical History:  Procedure Laterality Date   ABDOMINAL AORTOGRAM W/LOWER EXTREMITY N/A 04/20/2022   Procedure: ABDOMINAL AORTOGRAM W/LOWER EXTREMITY;  Surgeon: Iran Ouch, MD;  Location: [facility];  Service: Cardiovascular;  Laterality: N/A;   BACK SURGERY     x 3 lower back   CARDIAC CATHETERIZATION  2000   small amt plaque relased from cardiology 20 yrs ago   CHOLECYSTECTOMY     COLONOSCOPY WITH PROPOFOL N/A 10/26/2021   Procedure: COLONOSCOPY WITH PROPOFOL;  Surgeon: Lanelle Bal, DO;  Location: [facility];  Service: Endoscopy;  Laterality: N/A;  To arrive at 1200   CORONARY ARTERY BYPASS GRAFT N/A 07/07/2022   Procedure: CORONARY ARTERY BYPASS GRAFTING (CABG) X5 USING OPEN LEFT INTERNAL MAMMARY ARTERY AND ENDOSCOPICALLY HARVESTED RIGHT GREATER SAPHENOUS VEIN;  Surgeon: Alleen Borne, MD;  Location: [facility] OR;  Service: Open Heart Surgery;  Laterality: N/A;   CYSTOSCOPY WITH URETHRAL DILATATION N/A 04/17/2019   Procedure: CYSTOSCOPY WITH URETHRAL DILATATION, CYSTOGRAM;  Surgeon: Rene Paci,  MD;  Location: [facility];  Service: Urology;  Laterality: N/A;   EYE SURGERY Bilateral    cataracts   IR RADIOLOGIST EVAL & MGMT  07/12/2021   IR RADIOLOGIST EVAL & MGMT  12/14/2022   PERIPHERAL VASCULAR BALLOON ANGIOPLASTY  04/20/2022   Procedure: PERIPHERAL VASCULAR BALLOON ANGIOPLASTY;  Surgeon: Iran Ouch, MD;  Location: [facility];  Service: Cardiovascular;;  aborted; unable to cross   POLYPECTOMY  10/26/2021   Procedure: POLYPECTOMY INTESTINAL;  Surgeon: Lanelle Bal, DO;  Location: [facility];  Service: Endoscopy;;   RIGHT/LEFT HEART CATH AND CORONARY ANGIOGRAPHY Bilateral 06/24/2022   Procedure: RIGHT/LEFT HEART CATH AND CORONARY ANGIOGRAPHY;  Surgeon: Iran Ouch, MD;  Location: [facility];  Service: Cardiovascular;  Laterality: Bilateral;   TEE WITHOUT CARDIOVERSION N/A 07/07/2022   Procedure: TRANSESOPHAGEAL ECHOCARDIOGRAM;  Surgeon: Alleen Borne, MD;  [facility];  Service: Open Heart Surgery;  Laterality: N/A;   urethral stricture surgery  15 yrs ago    Current Outpatient Medications:    methylPREDNISolone (MEDROL DOSEPAK) 4 MG TBPK tablet, 6 day dose pack - take as directed, Disp: 21 tablet, Rfl: 0   metoprolol tartrate (LOPRESSOR) 50 MG tablet, Take 50 mg by mouth 2 (two) times daily., Disp: , Rfl:    acetaminophen (TYLENOL) 325 MG tablet, Take 2 tablets (650 mg total) by mouth  every 4 (four) hours as needed., Disp: , Rfl:    albuterol (VENTOLIN HFA) 108 (90 Base) MCG/ACT inhaler, Inhale 1-2 puffs into the lungs every 6 (six) hours as needed for wheezing or shortness of breath., Disp: , Rfl:    aspirin EC 81 MG tablet, Take 81 mg by mouth daily. Swallow whole., Disp: , Rfl:    cholecalciferol (VITAMIN D3) 25 MCG (1000 UNIT) tablet, Take 1,000 Units by mouth daily., Disp: , Rfl:    clopidogrel (PLAVIX) 75 MG tablet, Take 1 tablet (75 mg total) by mouth daily., Disp: 90 tablet, Rfl: 3   cyanocobalamin (VITAMIN B12) 1000 MCG  tablet, Take 1,000 mcg by mouth daily., Disp: , Rfl:    finasteride (PROSCAR) 5 MG tablet, TAKE 1 TABLET BY MOUTH DAILY, Disp: 100 tablet, Rfl: 2   folic acid (FOLVITE) 1 MG tablet, Take 1 mg by mouth daily., Disp: , Rfl:    gabapentin (NEURONTIN) 300 MG capsule, Take 1 capsule (300 mg total) by mouth at bedtime., Disp: 30 capsule, Rfl: 5   glipiZIDE (GLUCOTROL) 5 MG tablet, TAKE 1 TABLET BY MOUTH TWICE DAILY BEFORE A MEAL, Disp: 30 tablet, Rfl: 0   hydrochlorothiazide (HYDRODIURIL) 25 MG tablet, TAKE 1 TABLET BY MOUTH DAILY, Disp: 100 tablet, Rfl: 2   hydrocortisone (ANUSOL-HC) 2.5 % rectal cream, Place 1 Application rectally 2 (two) times daily. For 10 days. May repeat cycle if needed., Disp: 30 g, Rfl: 1   levocetirizine (XYZAL) 5 MG tablet, Take 1 tablet (5 mg total) by mouth every evening., Disp: 30 tablet, Rfl: 5   linaclotide (LINZESS) 145 MCG CAPS capsule, Take 145 mcg by mouth daily as needed (constipation)., Disp: , Rfl:    losartan (COZAAR) 100 MG tablet, 1 tablet Orally Once a day, Disp: , Rfl:    Menthol, Topical Analgesic, (BLUE-EMU MAXIMUM STRENGTH EX), Apply 1 Application topically daily as needed (pain)., Disp: , Rfl:    methotrexate (RHEUMATREX) 2.5 MG tablet, Take 5 tablets (12.5 mg total) by mouth every Sunday. Resume on Sunday 07/24/22. Caution:Chemotherapy. Protect from light., Disp: 5 tablet, Rfl: 0   metoprolol succinate (TOPROL-XL) 100 MG 24 hr tablet, Take 1 tablet (100 mg total) by mouth daily. Take with or immediately following a meal., Disp: 90 tablet, Rfl: 3   omeprazole (PRILOSEC) 20 MG capsule, Take 1 capsule (20 mg total) by mouth daily., Disp: 30 capsule, Rfl: 5   predniSONE (DELTASONE) 1 MG tablet, Take 1 mg by mouth every other day. Takes 1 tablet every other day for 30 days, Disp: , Rfl:    rosuvastatin (CRESTOR) 20 MG tablet, Take 1 tablet (20 mg total) by mouth daily., Disp: 90 tablet, Rfl: 3   silodosin (RAPAFLO) 8 MG CAPS capsule, Take 1 capsule (8 mg total) by  mouth at bedtime., Disp: 90 capsule, Rfl: 3   triamcinolone cream (KENALOG) 0.1 %, Apply 1 Application topically daily as needed (itching)., Disp: 80 g, Rfl: 1   vitamin E 180 MG (400 UNITS) capsule, Take 400 Units by mouth daily., Disp: , Rfl:   Allergies  Allergen Reactions   Ciprofloxacin Itching   Mosquito (Diagnostic) Itching   Oxycodone-Acetaminophen Rash   Review of Systems Objective:  There were no vitals filed for this visit.  General: Well developed, nourished, in no acute distress, alert and oriented x3   Dermatological: Skin is warm, dry and supple bilateral. Nails x 10 are well maintained; remaining integument appears unremarkable at this time. There are no open sores, no preulcerative lesions,  no rash or signs of infection present.  Vascular: Dorsalis Pedis artery and Posterior Tibial artery pedal pulses are 2/4 bilateral with immedate capillary fill time. Pedal hair growth present. No varicosities and no lower extremity edema present bilateral.   Neruologic: Grossly intact via light touch bilateral. Vibratory intact via tuning fork bilateral. Protective threshold with Semmes Wienstein monofilament intact to all pedal sites bilateral. Patellar and Achilles deep tendon reflexes 2+ bilateral. No Babinski or clonus noted bilateral.   Musculoskeletal: No gross boney pedal deformities bilateral. No pain, crepitus, or limitation noted with foot and ankle range of motion bilateral. Muscular strength 5/5 in all groups tested bilateral.  Gait: Unassisted, Nonantalgic.    Radiographs:  ***  Assessment & Plan:   Assessment: ***  Plan: ***     Corky Blumstein T. [place], [place]

## 2023-03-24 LAB — CBC WITH DIFFERENTIAL/PLATELET
Absolute Lymphocytes: 2804 {cells}/uL (ref 850–3900)
Absolute Monocytes: 1195 {cells}/uL — ABNORMAL HIGH (ref 200–950)
Basophils Absolute: 17 {cells}/uL (ref 0–200)
Basophils Relative: 0.2 %
Eosinophils Absolute: 181 {cells}/uL (ref 15–500)
Eosinophils Relative: 2.1 %
HCT: 38.5 % (ref 38.5–50.0)
Hemoglobin: 12.4 g/dL — ABNORMAL LOW (ref 13.2–17.1)
MCH: 29.3 pg (ref 27.0–33.0)
MCHC: 32.2 g/dL (ref 32.0–36.0)
MCV: 91 fL (ref 80.0–100.0)
MPV: 11.7 fL (ref 7.5–12.5)
Monocytes Relative: 13.9 %
Neutro Abs: 4403 {cells}/uL (ref 1500–7800)
Neutrophils Relative %: 51.2 %
Platelets: 213 10*3/uL (ref 140–400)
RBC: 4.23 10*6/uL (ref 4.20–5.80)
RDW: 15.1 % — ABNORMAL HIGH (ref 11.0–15.0)
Total Lymphocyte: 32.6 %
WBC: 8.6 10*3/uL (ref 3.8–10.8)

## 2023-03-24 LAB — ANA: Anti Nuclear Antibody (ANA): NEGATIVE

## 2023-03-24 LAB — C-REACTIVE PROTEIN: CRP: 11.9 mg/L — ABNORMAL HIGH (ref <8.0)

## 2023-03-24 LAB — SEDIMENTATION RATE: Sed Rate: 70 mm/h — ABNORMAL HIGH (ref 0–20)

## 2023-03-24 LAB — URIC ACID: Uric Acid, Serum: 7.8 mg/dL (ref 4.0–8.0)

## 2023-03-24 LAB — RHEUMATOID FACTOR: Rheumatoid fact SerPl-aCnc: <10 [IU]/mL (ref <14)

## 2023-04-06 ENCOUNTER — Ambulatory Visit: Payer: 59 | Admitting: Vascular Surgery

## 2023-04-06 ENCOUNTER — Encounter (HOSPITAL_COMMUNITY): Payer: 59

## 2023-04-07 ENCOUNTER — Telehealth: Payer: Self-pay

## 2023-04-07 NOTE — Telephone Encounter (Signed)
Called patient to notify, no answer, mailbox full

## 2023-04-07 NOTE — Telephone Encounter (Signed)
-----   Message from Lottie Rater sent at 04/05/2023  2:30 PM EST -----  ----- Message ----- From: Elinor Parkinson, DPM Sent: 04/05/2023   7:25 AM EST To: Kristian Covey, PMAC  Looks like it was probably gout.  Could start him on allopurinol 100 mg twice daily.  Follow-up with me in 1 month after he starts that.

## 2023-04-12 ENCOUNTER — Other Ambulatory Visit: Payer: Self-pay

## 2023-04-12 MED ORDER — ALLOPURINOL 100 MG PO TABS: 100.0000 mg | ORAL_TABLET | Freq: Two times a day (BID) | ORAL | 6 refills | Status: AC

## 2023-04-13 ENCOUNTER — Other Ambulatory Visit: Payer: Self-pay | Admitting: Internal Medicine

## 2023-04-18 ENCOUNTER — Ambulatory Visit (INDEPENDENT_AMBULATORY_CARE_PROVIDER_SITE_OTHER): Payer: 59 | Admitting: Podiatry

## 2023-04-18 ENCOUNTER — Encounter: Payer: Self-pay | Admitting: Podiatry

## 2023-04-18 DIAGNOSIS — M19071 Primary osteoarthritis, right ankle and foot: Secondary | ICD-10-CM

## 2023-04-18 DIAGNOSIS — D2371 Other benign neoplasm of skin of right lower limb, including hip: Secondary | ICD-10-CM

## 2023-04-18 NOTE — Progress Notes (Signed)
He presents today for chief complaint gout and he has been taking his allopurinol twice a day since we sent in for him has not had a follow-up gout attack.  Does state that the hammertoe of the second digit is resulting in a painful callus on the end of the toe.  Objective: Vital signs stable alert oriented x 3 pulses are palpable.  Physical exam is unremarkable for gout.  He does have rigid hammertoe deformities which have resulted in a distal clavus of the second digit of the right foot which is tender on palpation.  Blood work did demonstrate inflammation and a slightly high uric acid at 7.8.  Most likely coming down from a higher number.  Assessment: Resolving gout.  Hammertoe deformity with distal clavus.  Plan: Discussed etiology pathology and surgical therapies at this point debrided the distal clavus today instructed him to stay on the allopurinol 100 mg in the morning 100 mg at night.  [facility]  I did trim the callus for him today.

## 2023-05-11 ENCOUNTER — Ambulatory Visit (HOSPITAL_COMMUNITY): Payer: 59

## 2023-05-11 ENCOUNTER — Ambulatory Visit: Payer: 59 | Admitting: Vascular Surgery

## 2023-05-16 ENCOUNTER — Ambulatory Visit
Admission: RE | Admit: 2023-05-16 | Discharge: 2023-05-16 | Disposition: A | Discharge status: Home / Self Care | Payer: 59 | Source: Ambulatory Visit | Attending: Family Medicine | Admitting: Family Medicine

## 2023-05-16 DIAGNOSIS — R1310 Dysphagia, unspecified: Secondary | ICD-10-CM

## 2023-05-22 ENCOUNTER — Other Ambulatory Visit: Payer: Self-pay | Admitting: Internal Medicine

## 2023-05-22 DIAGNOSIS — K219 Gastro-esophageal reflux disease without esophagitis: Secondary | ICD-10-CM

## 2023-05-30 ENCOUNTER — Encounter: Payer: Self-pay | Admitting: Podiatry

## 2023-05-30 ENCOUNTER — Ambulatory Visit (INDEPENDENT_AMBULATORY_CARE_PROVIDER_SITE_OTHER): Payer: 59 | Admitting: Podiatry

## 2023-05-30 DIAGNOSIS — D2371 Other benign neoplasm of skin of right lower limb, including hip: Secondary | ICD-10-CM | POA: Diagnosis not present

## 2023-05-30 DIAGNOSIS — M19071 Primary osteoarthritis, right ankle and foot: Secondary | ICD-10-CM

## 2023-05-31 NOTE — Progress Notes (Signed)
He presents today for follow-up of his calluses and goes on to states that since we put him on the allopurinol he has not had a gout attack.  He is very happy with that.  Objective: Vital signs stable he is alert oriented x 3 he has very small minor calluses to the distal aspect of the second toe on the right foot and subfifth metatarsal heads bilaterally.  Pulses are palpable there is no signs of gout.  Assessment: Benign skin lesions with nail dystrophy.  History of gout well-controlled with allopurinol 100 mg twice daily  Plan: Discussed etiology pathology conservative versus surgical therapies.  I am going to request that he follow-up with Dr. Eloy End for routine nail and callus debridement.  He will follow-up with me for any gout issues.

## 2023-06-02 ENCOUNTER — Ambulatory Visit (INDEPENDENT_AMBULATORY_CARE_PROVIDER_SITE_OTHER): Payer: 59 | Admitting: Urology

## 2023-06-02 VITALS — BP 179/84 | HR 85

## 2023-06-02 DIAGNOSIS — N400 Enlarged prostate without lower urinary tract symptoms: Secondary | ICD-10-CM | POA: Diagnosis not present

## 2023-06-02 DIAGNOSIS — R351 Nocturia: Secondary | ICD-10-CM | POA: Diagnosis not present

## 2023-06-02 DIAGNOSIS — N3001 Acute cystitis with hematuria: Secondary | ICD-10-CM | POA: Diagnosis not present

## 2023-06-02 LAB — URINALYSIS, ROUTINE W REFLEX MICROSCOPIC
Bilirubin, UA: NEGATIVE
Glucose, UA: NEGATIVE
Ketones, UA: NEGATIVE
Nitrite, UA: NEGATIVE
Protein,UA: NEGATIVE
Specific Gravity, UA: 1.015 (ref 1.005–1.030)
Urobilinogen, Ur: 0.2 mg/dL (ref 0.2–1.0)
pH, UA: 6 (ref 5.0–7.5)

## 2023-06-02 LAB — BLADDER SCAN AMB NON-IMAGING: Scan Result: 188

## 2023-06-02 LAB — MICROSCOPIC EXAMINATION: Bacteria, UA: NONE SEEN

## 2023-06-02 MED ORDER — FINASTERIDE 5 MG PO TABS: 5.0000 mg | ORAL_TABLET | Freq: Every day | ORAL | 2 refills | Status: DC

## 2023-06-02 MED ORDER — SILODOSIN 8 MG PO CAPS: 8.0000 mg | ORAL_CAPSULE | Freq: Every day | ORAL | 3 refills | Status: DC

## 2023-06-02 MED ORDER — NITROFURANTOIN MONOHYD MACRO 100 MG PO CAPS: 100.0000 mg | ORAL_CAPSULE | Freq: Two times a day (BID) | ORAL | 0 refills | Status: DC

## 2023-06-02 NOTE — Progress Notes (Unsigned)
post void residual= 188

## 2023-06-02 NOTE — Progress Notes (Unsigned)
06/02/2023 12:27 PM   Jonathan [name] [birth year] [id number]  Referring provider: Anabel Halon, MD [address] [place],  [place] [address]  Followup BPH   HPI: Jonathan [name] is a 77yo here for folowup for BPH and nocturia. IPSS 8 QOL 3. For the past 3-4 weeks he has noticed intermittent dysuria. His nocturia resolved on rapaflo 8mg . Uirne stream is strong. No straining to urinate. No gross hematuria. UA is concerning for infection    PMH: Past Medical History:  Diagnosis Date   Arthritis    ra, sees dr Alean Rinne   Chronic kidney disease    Coronary artery disease    saw dr Armanda Magic in [place] 20 yrs ago, released from cardiology   Diabetes mellitus without complication [facility])    type 2   Dyspnea 07/2022   Lately r/t CAD   GERD (gastroesophageal reflux disease)    Hypercholesteremia    Hypertension    Peripheral vascular disease (HCC)    Stroke (HCC) 2007   "Light Stroke" per pt. No deficits   Urethral stricture     Surgical History: Past Surgical History:  Procedure Laterality Date   ABDOMINAL AORTOGRAM W/LOWER EXTREMITY N/A 04/20/2022   Procedure: ABDOMINAL AORTOGRAM W/LOWER EXTREMITY;  Surgeon: Iran Ouch, MD;  Location: [facility];  Service: Cardiovascular;  Laterality: N/A;   BACK SURGERY     x 3 lower back   CARDIAC CATHETERIZATION  2000   small amt plaque relased from cardiology 20 yrs ago   CHOLECYSTECTOMY     COLONOSCOPY WITH PROPOFOL N/A 10/26/2021   Procedure: COLONOSCOPY WITH PROPOFOL;  Surgeon: Lanelle Bal, DO;  Location: [facility];  Service: Endoscopy;  Laterality: N/A;  To arrive at 1200   CORONARY ARTERY BYPASS GRAFT N/A 07/07/2022   Procedure: CORONARY ARTERY BYPASS GRAFTING (CABG) X5 USING OPEN LEFT INTERNAL MAMMARY ARTERY AND ENDOSCOPICALLY HARVESTED RIGHT GREATER SAPHENOUS VEIN;  Surgeon: Alleen Borne, MD;  Location: [facility] OR;  Service: Open Heart Surgery;  Laterality: N/A;   CYSTOSCOPY WITH URETHRAL DILATATION N/A  04/17/2019   Procedure: CYSTOSCOPY WITH URETHRAL DILATATION, CYSTOGRAM;  Surgeon: Rene Paci, MD;  [facility];  Service: Urology;  Laterality: N/A;   EYE SURGERY Bilateral    cataracts   IR RADIOLOGIST EVAL & MGMT  07/12/2021   IR RADIOLOGIST EVAL & MGMT  12/14/2022   PERIPHERAL VASCULAR BALLOON ANGIOPLASTY  04/20/2022   Procedure: PERIPHERAL VASCULAR BALLOON ANGIOPLASTY;  Surgeon: Iran Ouch, MD;  [facility]: [facility];  Service: Cardiovascular;;  aborted; unable to cross   POLYPECTOMY  10/26/2021   Procedure: POLYPECTOMY INTESTINAL;  Surgeon: Lanelle Bal, DO;  [facility]: [facility];  Service: Endoscopy;;   RIGHT/LEFT HEART CATH AND CORONARY ANGIOGRAPHY Bilateral 06/24/2022   Procedure: RIGHT/LEFT HEART CATH AND CORONARY ANGIOGRAPHY;  Surgeon: Iran Ouch, MD;  Location: [facility] INVASIVE CV [facility];  Service: Cardiovascular;  Laterality: Bilateral;   TEE WITHOUT CARDIOVERSION N/A 07/07/2022   Procedure: TRANSESOPHAGEAL ECHOCARDIOGRAM;  Surgeon: Alleen Borne, MD;  Location: [facility] OR;  Service: Open Heart Surgery;  Laterality: N/A;   urethral stricture surgery  15 yrs ago    Home Medications:  Allergies as of 06/02/2023       Reactions   Ciprofloxacin Itching   Mosquito (diagnostic) Itching   Oxycodone-acetaminophen Rash        Medication List        Accurate as of June 02, 2023 12:27  PM. If you have any questions, ask your nurse or doctor.          acetaminophen 325 MG tablet Commonly known as: TYLENOL Take 2 tablets (650 mg total) by mouth every 4 (four) hours as needed.   albuterol 108 (90 Base) MCG/ACT inhaler Commonly known as: VENTOLIN HFA Inhale 1-2 puffs into the lungs every 6 (six) hours as needed for wheezing or shortness of breath.   allopurinol 100 MG tablet Commonly known as: ZYLOPRIM Take 1 tablet (100 mg total) by mouth 2 (two) times daily.   aspirin EC 81 MG tablet Take 81 mg by mouth daily.  Swallow whole.   BLUE-EMU MAXIMUM STRENGTH EX Apply 1 Application topically daily as needed (pain).   cholecalciferol 25 MCG (1000 UNIT) tablet Commonly known as: VITAMIN D3 Take 1,000 Units by mouth daily.   clopidogrel 75 MG tablet Commonly known as: PLAVIX Take 1 tablet (75 mg total) by mouth daily.   cyanocobalamin 1000 MCG tablet Commonly known as: VITAMIN B12 Take 1,000 mcg by mouth daily.   finasteride 5 MG tablet Commonly known as: PROSCAR TAKE 1 TABLET BY MOUTH DAILY   folic acid 1 MG tablet Commonly known as: FOLVITE Take 1 mg by mouth daily.   gabapentin 300 MG capsule Commonly known as: NEURONTIN Take 1 capsule (300 mg total) by mouth at bedtime.   glipiZIDE 5 MG tablet Commonly known as: GLUCOTROL TAKE 1 TABLET BY MOUTH TWICE DAILY BEFORE A MEAL   hydrochlorothiazide 25 MG tablet Commonly known as: HYDRODIURIL TAKE 1 TABLET BY MOUTH DAILY   hydrocortisone 2.5 % rectal cream Commonly known as: ANUSOL-HC Place 1 Application rectally 2 (two) times daily. For 10 days. May repeat cycle if needed.   levocetirizine 5 MG tablet Commonly known as: XYZAL Take 1 tablet (5 mg total) by mouth every evening.   Linzess 145 MCG Caps capsule Generic drug: linaclotide Take 145 mcg by mouth daily as needed (constipation).   losartan 100 MG tablet Commonly known as: COZAAR 1 tablet Orally Once a day   methotrexate 2.5 MG tablet Commonly known as: RHEUMATREX Take 5 tablets (12.5 mg total) by mouth every Sunday. Resume on Sunday 07/24/22. Caution:Chemotherapy. Protect from light.   metoprolol succinate 100 MG 24 hr tablet Commonly known as: TOPROL-XL Take 1 tablet (100 mg total) by mouth daily. Take with or immediately following a meal.   metoprolol tartrate 50 MG tablet Commonly known as: LOPRESSOR TAKE 1 TABLET BY MOUTH TWICE  DAILY   omeprazole 20 MG capsule Commonly known as: PRILOSEC Take 1 capsule by mouth once daily   predniSONE 1 MG tablet Commonly  known as: DELTASONE Take 1 mg by mouth every other day. Takes 1 tablet every other day for 30 days   rosuvastatin 20 MG tablet Commonly known as: CRESTOR Take 1 tablet (20 mg total) by mouth daily.   silodosin 8 MG Caps capsule Commonly known as: RAPAFLO Take 1 capsule (8 mg total) by mouth at bedtime.   triamcinolone cream 0.1 % Commonly known as: KENALOG Apply 1 Application topically daily as needed (itching).   vitamin E 180 MG (400 UNITS) capsule Take 400 Units by mouth daily.        Allergies:  Allergies  Allergen Reactions   Ciprofloxacin Itching   Mosquito (Diagnostic) Itching   Oxycodone-Acetaminophen Rash    Family History: Family History  Problem Relation Age of Onset   Heart Problems Father     Social History:  reports that he has  never smoked. He has never used smokeless tobacco. He reports that he does not drink alcohol and does not use drugs.  ROS: All other review of systems were reviewed and are negative except what is noted above in HPI  Physical Exam: BP (!) 179/84   Pulse 85   Constitutional:  Alert and oriented, No acute distress. HEENT: [place] AT, moist mucus membranes.  Trachea midline, no masses. Cardiovascular: No clubbing, cyanosis, or edema. Respiratory: Normal respiratory effort, no increased work of breathing. GI: Abdomen is soft, nontender, nondistended, no abdominal masses GU: No CVA tenderness.  Lymph: No cervical or inguinal lymphadenopathy. Skin: No rashes, bruises or suspicious lesions. Neurologic: Grossly intact, no focal deficits, moving all 4 extremities. Psychiatric: Normal mood and affect.  Laboratory Data: Lab Results  Component Value Date   WBC 8.6 03/23/2023   HGB 12.4 (L) 03/23/2023   HCT 38.5 03/23/2023   MCV 91.0 03/23/2023   PLT 213 03/23/2023    Lab Results  Component Value Date   CREATININE 1.39 (H) 07/21/2022    No results found for: "PSA"  No results found for: "TESTOSTERONE"  Lab Results   Component Value Date   HGBA1C 6.1 (H) 07/05/2022    Urinalysis    Component Value Date/Time   COLORURINE YELLOW 07/05/2022 1651   APPEARANCEUR Clear 12/21/2022 0935   LABSPEC 1.011 07/05/2022 1651   PHURINE 5.0 07/05/2022 1651   GLUCOSEU Negative 12/21/2022 0935   HGBUR MODERATE (A) 07/05/2022 1651   HGBUR negative 04/16/2007 0000   BILIRUBINUR Negative 12/21/2022 0935   KETONESUR NEGATIVE 07/05/2022 1651   PROTEINUR Negative 12/21/2022 0935   PROTEINUR NEGATIVE 07/05/2022 1651   UROBILINOGEN 0.2 12/28/2020 1532   UROBILINOGEN negative 04/16/2007 0000   NITRITE Negative 12/21/2022 0935   NITRITE NEGATIVE 07/05/2022 1651   LEUKOCYTESUR Negative 12/21/2022 0935   LEUKOCYTESUR NEGATIVE 07/05/2022 1651    Lab Results  Component Value Date   LABMICR See below: 12/21/2022   WBCUA 0-5 12/21/2022   LABEPIT 0-10 12/21/2022   MUCUS Present 05/12/2021   BACTERIA None seen 12/21/2022    Pertinent Imaging:  No results found for this or any previous visit.  No results found for this or any previous visit.  No results found for this or any previous visit.  No results found for this or any previous visit.  Results for orders placed during the hospital encounter of 12/17/20  US Renal  Narrative CLINICAL DATA:  Recurrent UTI in a 78 year old male.  EXAM: RENAL / URINARY TRACT ULTRASOUND COMPLETE  COMPARISON:  More remote studies dating back to 2005.  FINDINGS: Right Kidney:  Renal measurements: 9.5 x 4.0 x 5.1 cm = volume: 100.1 mL. Limited exam due to body habitus, no hydronephrosis. Mild parenchymal thinning.  Left Kidney:  Renal measurements: 9.8 x 5.2 x 5.2 cm = volume: 138.8 mL. Limited exam due to overlying bowel gas and body habitus. No hydronephrosis. Mild parenchymal thinning. Small cyst arising from the lower pole measuring 1.2 x 1.0 x 1.0 cm.  Bladder:  Appears normal for degree of bladder distention.  Other:  Increased hepatic  echogenicity.  IMPRESSION: Mild cortical thinning without signs of hydronephrosis. Limited exam due to body habitus and bowel gas.  Small LEFT lower pole cyst.  Hepatic steatosis.   Electronically Signed By: Donzetta Kohut M.D. On: 12/18/2020 14:12  No results found for this or any previous visit.  Results for orders placed during the hospital encounter of 03/24/21  CT HEMATURIA WORKUP  Narrative CLINICAL  DATA:  Microhematuria  EXAM: CT ABDOMEN AND PELVIS WITHOUT AND WITH CONTRAST  TECHNIQUE: Multidetector CT imaging of the abdomen and pelvis was performed following the standard protocol before and following the bolus administration of intravenous contrast.  CONTRAST:  OMNIPAQUE IOHEXOL 350 MG/ML SOLN  COMPARISON:  None.  FINDINGS: Lower chest: No acute abnormality. Bandlike scarring of the bilateral lung bases.  Hepatobiliary: No focal liver abnormality is seen. Status post cholecystectomy. No biliary dilatation.  Pancreas: Unremarkable. No pancreatic ductal dilatation or surrounding inflammatory changes.  Spleen: Normal in size without significant abnormality.  Adrenals/Urinary Tract: Adrenal glands are unremarkable. Occasional small bilateral renal cysts. Kidneys are otherwise normal, without renal calculi, solid lesion, or hydronephrosis. No evidence of urinary tract filling defect on delayed phase imaging. Thickening of the urinary bladder wall. Diverticulum of the anterior bladder dome (series 7, image 74, series 12, image 69).  Stomach/Bowel: Stomach is within normal limits. Appendix appears normal. No evidence of bowel wall thickening, distention, or inflammatory changes.  Vascular/Lymphatic: Aortic atherosclerosis. No enlarged abdominal or pelvic lymph nodes.  Reproductive: Prostatomegaly with median lobe hypertrophy.  Other: No abdominal wall hernia or abnormality. No abdominopelvic ascites.  Musculoskeletal: No acute or significant  osseous findings.  IMPRESSION: 1. No evidence of urinary tract calculus, mass, or hydronephrosis. No evidence of urinary tract filling defect on delayed phase imaging. 2. Thickening of the urinary bladder wall, likely related to chronic outlet obstruction although infectious or inflammatory cystitis is a differential consideration in the setting of hematuria. 3. Prostatomegaly.  Aortic Atherosclerosis (ICD10-I70.0).   Electronically Signed By: Jearld Lesch M.D. On: 03/26/2021 10:13  No results found for this or any previous visit.   Assessment & Plan:    1. Benign prostatic hyperplasia, unspecified whether lower urinary tract symptoms present (Primary) Continue rapaflo 8mg  daily - Urinalysis, Routine w reflex microscopic - BLADDER SCAN AMB NON-IMAGING  2. Nocturia Continue rapaflo 8mg  daily.   3. Acute cystitis -urine for culture -macrobid 100mg  BID for 7 days   No follow-ups on file.  Wilkie Aye, MD  [facility] [place]

## 2023-06-04 LAB — URINE CULTURE: Organism ID, Bacteria: NO GROWTH

## 2023-06-06 ENCOUNTER — Other Ambulatory Visit: Payer: 59

## 2023-06-06 ENCOUNTER — Encounter: Payer: Self-pay | Admitting: Urology

## 2023-06-06 NOTE — Patient Instructions (Signed)

## 2023-06-13 NOTE — Progress Notes (Signed)
Office Note     HPI: Jonathan [name] is a 78 y.o. ([birth year]) male presenting in follow-up with known left lower extremity limb ischemia with tissue loss of the toe.    At his last visit, he had a toe wound that had healed without intervention.    Jonathan [name] was originally scheduled for high risk left lower extremity bypass surgery with a very poor target, but during cardiac workup was noted to have severe three-vessel disease requiring CABG.  His CABG went well, and in the interim, his toe healed.  Originally from [place], he and his wife now reside in [place].  Jonathan [name] has a longstanding history of type 1 diabetes.  On exam, Jonathan [name] was doing well, accompanied by his wife.  He was all smiles, dressed very nicely.  Overall, he states that he has had no progression and lower extremity symptoms.  He still has short distance claudication, but denies rest pain, tissue loss.    Surgical history includes left lower extremity angiogram with Dr. Renato Gails in December 2023 in an effort to define and improve perfusion or rest pain.  Angiogram demonstrated severe tibial disease with reconstitution of the dorsalis pedis and distal posterior tibial arteries.  Pedal access, retrograde approach was attempted, but failed.   The pt is  on a statin for cholesterol management.  The pt is  on a daily aspirin.   Other AC:  - The pt is  on medication for hypertension.   The pt is  diabetic.  Tobacco hx:  -  Past Medical History:  Diagnosis Date   Arthritis    ra, sees dr Alean Rinne   Chronic kidney disease    Coronary artery disease    saw dr Armanda Magic in [place] 20 yrs ago, released from cardiology   Diabetes mellitus without complication [facility])    type 2   Dyspnea 07/2022   Lately r/t CAD   GERD (gastroesophageal reflux disease)    Hypercholesteremia    Hypertension    Peripheral vascular disease (HCC)    Stroke (HCC) 2007   "Light Stroke" per pt. No deficits   Urethral stricture      Past Surgical History:  Procedure Laterality Date   ABDOMINAL AORTOGRAM W/LOWER EXTREMITY N/A 04/20/2022   Procedure: ABDOMINAL AORTOGRAM W/LOWER EXTREMITY;  Surgeon: Iran Ouch, MD;  Location: [facility];  Service: Cardiovascular;  Laterality: N/A;   BACK SURGERY     x 3 lower back   CARDIAC CATHETERIZATION  2000   small amt plaque relased from cardiology 20 yrs ago   CHOLECYSTECTOMY     COLONOSCOPY WITH PROPOFOL N/A 10/26/2021   Procedure: COLONOSCOPY WITH PROPOFOL;  Surgeon: Lanelle Bal, DO;  Location: [facility];  Service: Endoscopy;  Laterality: N/A;  To arrive at 1200   CORONARY ARTERY BYPASS GRAFT N/A 07/07/2022   Procedure: CORONARY ARTERY BYPASS GRAFTING (CABG) X5 USING OPEN LEFT INTERNAL MAMMARY ARTERY AND ENDOSCOPICALLY HARVESTED RIGHT GREATER SAPHENOUS VEIN;  Surgeon: Alleen Borne, MD;  Location: [facility] OR;  Service: Open Heart Surgery;  Laterality: N/A;   CYSTOSCOPY WITH URETHRAL DILATATION N/A 04/17/2019   Procedure: CYSTOSCOPY WITH URETHRAL DILATATION, CYSTOGRAM;  Surgeon: Rene Paci, MD;  Location: [facility];  Service: Urology;  Laterality: N/A;   EYE SURGERY Bilateral    cataracts   IR RADIOLOGIST EVAL & MGMT  07/12/2021   IR RADIOLOGIST EVAL & MGMT  12/14/2022   PERIPHERAL VASCULAR BALLOON ANGIOPLASTY  04/20/2022  Procedure: PERIPHERAL VASCULAR BALLOON ANGIOPLASTY;  Surgeon: Iran Ouch, MD;  Location: [facility];  Service: Cardiovascular;;  aborted; unable to cross   POLYPECTOMY  10/26/2021   Procedure: POLYPECTOMY INTESTINAL;  Surgeon: Lanelle Bal, DO;  Location: [facility];  Service: Endoscopy;;   RIGHT/LEFT HEART CATH AND CORONARY ANGIOGRAPHY Bilateral 06/24/2022   Procedure: RIGHT/LEFT HEART CATH AND CORONARY ANGIOGRAPHY;  Surgeon: Iran Ouch, MD;  Location: [facility] INVASIVE CV LAB;  Service: Cardiovascular;  Laterality: Bilateral;   TEE WITHOUT CARDIOVERSION N/A 07/07/2022   Procedure:  TRANSESOPHAGEAL ECHOCARDIOGRAM;  Surgeon: Alleen Borne, MD;  Location: [facility] OR;  Service: Open Heart Surgery;  Laterality: N/A;   urethral stricture surgery  15 yrs ago    Social History   Socioeconomic History   Marital status: Married    Spouse name: Not on file   Number of children: Not on file   Years of education: Not on file   Highest education level: Not on file  Occupational History   Not on file  Tobacco Use   Smoking status: Never   Smokeless tobacco: Never  Vaping Use   Vaping status: Never Used  Substance and Sexual Activity   Alcohol use: No   Drug use: No   Sexual activity: Yes  Other Topics Concern   Not on file  Social History Narrative   Not on file   Social Drivers of Health   Financial Resource Strain: Low Risk  (11/15/2021)   Overall Financial Resource Strain (CARDIA)    Difficulty of Paying Living Expenses: Not hard at all  Food Insecurity: No Food Insecurity (07/11/2022)   Hunger Vital Sign    Worried About Running Out of Food in the Last Year: Never true    Ran Out of Food in the Last Year: Never true  Transportation Needs: No Transportation Needs (07/11/2022)   PRAPARE - Administrator, Civil Service (Medical): No    Lack of Transportation (Non-Medical): No  Physical Activity: Sufficiently Active (11/15/2021)   Exercise Vital Sign    Days of Exercise per Week: 5 days    Minutes of Exercise per Session: 30 min  Stress: No Stress Concern Present (11/15/2021)   [name] [employer] - Occupational Stress Questionnaire    Feeling of Stress : Not at all  Social Connections: Moderately Integrated (11/15/2021)   Social Connection and Isolation Panel [NHANES]    Frequency of Communication with Friends and Family: Three times a week    Frequency of Social Gatherings with Friends and Family: Twice a week    Attends Religious Services: More than 4 times per year    Active Member of [employer] or Organizations: No    Attends Occupational hygienist Meetings: Never    Marital Status: Married  Catering manager Violence: Not At Risk (07/11/2022)   Humiliation, Afraid, Rape, and Kick questionnaire    Fear of Current or Ex-Partner: No    Emotionally Abused: No    Physically Abused: No    Sexually Abused: No   Family History  Problem Relation Age of Onset   Heart Problems Father     Current Outpatient Medications  Medication Sig Dispense Refill   allopurinol (ZYLOPRIM) 100 MG tablet Take 1 tablet (100 mg total) by mouth 2 (two) times daily. 60 tablet 6   acetaminophen (TYLENOL) 325 MG tablet Take 2 tablets (650 mg total) by mouth every 4 (four) hours as needed.     albuterol (VENTOLIN  HFA) 108 (90 Base) MCG/ACT inhaler Inhale 1-2 puffs into the lungs every 6 (six) hours as needed for wheezing or shortness of breath.     aspirin EC 81 MG tablet Take 81 mg by mouth daily. Swallow whole.     cholecalciferol (VITAMIN D3) 25 MCG (1000 UNIT) tablet Take 1,000 Units by mouth daily.     clopidogrel (PLAVIX) 75 MG tablet Take 1 tablet (75 mg total) by mouth daily. 90 tablet 3   cyanocobalamin (VITAMIN B12) 1000 MCG tablet Take 1,000 mcg by mouth daily.     finasteride (PROSCAR) 5 MG tablet Take 1 tablet (5 mg total) by mouth daily. 90 tablet 2   folic acid (FOLVITE) 1 MG tablet Take 1 mg by mouth daily.     gabapentin (NEURONTIN) 300 MG capsule Take 1 capsule (300 mg total) by mouth at bedtime. 30 capsule 5   glipiZIDE (GLUCOTROL) 5 MG tablet TAKE 1 TABLET BY MOUTH TWICE DAILY BEFORE A MEAL 30 tablet 0   hydrochlorothiazide (HYDRODIURIL) 25 MG tablet TAKE 1 TABLET BY MOUTH DAILY 100 tablet 2   hydrocortisone (ANUSOL-HC) 2.5 % rectal cream Place 1 Application rectally 2 (two) times daily. For 10 days. May repeat cycle if needed. 30 g 1   levocetirizine (XYZAL) 5 MG tablet Take 1 tablet (5 mg total) by mouth every evening. 30 tablet 5   linaclotide (LINZESS) 145 MCG CAPS capsule Take 145 mcg by mouth daily as needed (constipation).      losartan (COZAAR) 100 MG tablet 1 tablet Orally Once a day     Menthol, Topical Analgesic, (BLUE-EMU MAXIMUM STRENGTH EX) Apply 1 Application topically daily as needed (pain).     methotrexate (RHEUMATREX) 2.5 MG tablet Take 5 tablets (12.5 mg total) by mouth every Sunday. Resume on Sunday 07/24/22. Caution:Chemotherapy. Protect from light. 5 tablet 0   metoprolol succinate (TOPROL-XL) 100 MG 24 hr tablet Take 1 tablet (100 mg total) by mouth daily. Take with or immediately following a meal. 90 tablet 3   metoprolol tartrate (LOPRESSOR) 50 MG tablet TAKE 1 TABLET BY MOUTH TWICE  DAILY 200 tablet 2   nitrofurantoin, macrocrystal-monohydrate, (MACROBID) 100 MG capsule Take 1 capsule (100 mg total) by mouth every 12 (twelve) hours. 14 capsule 0   omeprazole (PRILOSEC) 20 MG capsule Take 1 capsule by mouth once daily 30 capsule 0   predniSONE (DELTASONE) 1 MG tablet Take 1 mg by mouth every other day. Takes 1 tablet every other day for 30 days     rosuvastatin (CRESTOR) 20 MG tablet Take 1 tablet (20 mg total) by mouth daily. 90 tablet 3   silodosin (RAPAFLO) 8 MG CAPS capsule Take 1 capsule (8 mg total) by mouth at bedtime. 90 capsule 3   triamcinolone cream (KENALOG) 0.1 % Apply 1 Application topically daily as needed (itching). 80 g 1   vitamin E 180 MG (400 UNITS) capsule Take 400 Units by mouth daily.     No current facility-administered medications for this visit.    Allergies  Allergen Reactions   Ciprofloxacin Itching   Mosquito (Diagnostic) Itching   Oxycodone-Acetaminophen Rash     REVIEW OF SYSTEMS:  [X]  denotes positive finding, [ ]  denotes negative finding Cardiac  Comments:  Chest pain or chest pressure:    Shortness of breath upon exertion:    Short of breath when lying flat:    Irregular heart rhythm:        Vascular    Pain in calf, thigh, or hip  brought on by ambulation:    Pain in feet at night that wakes you up from your sleep:     Blood clot in your veins:     Leg swelling:         Pulmonary    Oxygen at home:    Productive cough:     Wheezing:         Neurologic    Sudden weakness in arms or legs:     Sudden numbness in arms or legs:     Sudden onset of difficulty speaking or slurred speech:    Temporary loss of vision in one eye:     Problems with dizziness:         Gastrointestinal    Blood in stool:     Vomited blood:         Genitourinary    Burning when urinating:     Blood in urine:        Psychiatric    Major depression:         Hematologic    Bleeding problems:    Problems with blood clotting too easily:        Skin    Rashes or ulcers:        Constitutional    Fever or chills:      PHYSICAL EXAMINATION:  There were no vitals filed for this visit.   General:  WDWN in NAD; vital signs documented above Gait: Not observed HENT: WNL, normocephalic Pulmonary: normal non-labored breathing , without wheezing Cardiac: regular HR Abdomen: soft, NT, no masses Skin: without rashes Vascular Exam/Pulses:  Right Left  Radial 2+ (normal) 2+ (normal)  Ulnar 2+ (normal) 2+ (normal)  Femoral    Popliteal    DP absent absent  PT absent absent   Extremities: with ischemic changes, without Gangrene , without cellulitis; with open wounds;  Musculoskeletal: no muscle wasting or atrophy  Neurologic: A&O X 3;  No focal weakness or paresthesias are detected Psychiatric:  The pt has Normal affect.   Non-Invasive Vascular Imaging:   See angiogram    ASSESSMENT/PLAN: Jonathan [name] is a 78 y.o. male presenting with left lower extremity critical limb ischemia with short distance claudication.  He has no wounds on his feet. We discussed the importance of keeping the webspaces dry.    We had a long discussion regarding management of his short distance claudication.  Prior angiography demonstrates poor tibial outflow with reconstitution of the pedal arteries.  Retrograde approach was unsuccessful.  A bypass to the pedal  artery for rest pain has a very high failure rate due to poor outflow. Being that his rest pain is tolerable, and he has no tissue loss, I think the best course of action is continued medical management as he does not have a good bypass target.    I have asked him to continue his current medication regimen.  My plan is to see him in 12 months.  He was asked to call my office should new ulcerations occur, or rest pain worsen.   Victorino Sparrow, MD [facility] [phone]  Total time of patient care including pre-visit research, consultation, and documentation greater than 30 minutes

## 2023-06-15 ENCOUNTER — Ambulatory Visit (HOSPITAL_COMMUNITY)
Admission: RE | Admit: 2023-06-15 | Discharge: 2023-06-15 | Disposition: A | Discharge status: Home / Self Care | Payer: 59 | Source: Ambulatory Visit | Attending: Vascular Surgery | Admitting: Vascular Surgery

## 2023-06-15 ENCOUNTER — Encounter: Payer: Self-pay | Admitting: Vascular Surgery

## 2023-06-15 ENCOUNTER — Ambulatory Visit (INDEPENDENT_AMBULATORY_CARE_PROVIDER_SITE_OTHER): Payer: 59 | Admitting: Vascular Surgery

## 2023-06-15 VITALS — BP 112/73 | HR 67 | Temp 97.9°F | Ht 69.0 in | Wt 223.2 lb

## 2023-06-15 DIAGNOSIS — I739 Peripheral vascular disease, unspecified: Secondary | ICD-10-CM

## 2023-06-15 DIAGNOSIS — I70223 Atherosclerosis of native arteries of extremities with rest pain, bilateral legs: Secondary | ICD-10-CM | POA: Insufficient documentation

## 2023-06-15 LAB — VAS US ABI WITH/WO TBI
Left ABI: 0.47
Right ABI: 0.5

## 2023-06-28 ENCOUNTER — Ambulatory Visit: Payer: 59 | Admitting: Urology

## 2023-07-06 ENCOUNTER — Other Ambulatory Visit: Payer: 59

## 2023-07-06 ENCOUNTER — Ambulatory Visit
Admission: RE | Admit: 2023-07-06 | Discharge: 2023-07-06 | Disposition: A | Discharge status: Home / Self Care | Source: Ambulatory Visit | Attending: Family Medicine | Admitting: Family Medicine

## 2023-08-01 ENCOUNTER — Encounter: Payer: Self-pay | Admitting: Emergency Medicine

## 2023-08-01 ENCOUNTER — Other Ambulatory Visit: Payer: Self-pay

## 2023-08-01 ENCOUNTER — Ambulatory Visit
Admission: EM | Admit: 2023-08-01 | Discharge: 2023-08-01 | Disposition: A | Discharge status: Home / Self Care | Attending: Family Medicine | Admitting: Family Medicine

## 2023-08-01 DIAGNOSIS — R220 Localized swelling, mass and lump, head: Secondary | ICD-10-CM | POA: Diagnosis not present

## 2023-08-01 MED ORDER — CETIRIZINE HCL 5 MG PO TABS: 5.0000 mg | ORAL_TABLET | Freq: Every day | ORAL | 0 refills | Status: DC

## 2023-08-01 MED ORDER — FAMOTIDINE 20 MG PO TABS: 20.0000 mg | ORAL_TABLET | Freq: Two times a day (BID) | ORAL | 0 refills | Status: AC

## 2023-08-01 NOTE — ED Triage Notes (Addendum)
 Pt reports bilateral upper lip swelling approx 830 this am. Pt reports swelling has improved and reports "feeling is coming back." Since taking benadryl. Airway patent.  Denies any extremity weakness. Pt alert and oriented. Speech clear. Steady gait.denies any new self care products.

## 2023-08-01 NOTE — Discharge Instructions (Signed)
 Take the prescribed medications until symptoms have fully resolved.  You may also apply ice to the lips to help with swelling.  Follow-up in the emergency department for significantly worsening symptoms anytime.

## 2023-08-01 NOTE — ED Provider Notes (Signed)
 [facility]    CSN: [id number] Arrival date & time: 08/01/23  1243      History   Chief Complaint Chief Complaint  Patient presents with   Oral Swelling    HPI Jonathan [name] is a 78 y.o. male.   Patient presenting today with new onset upper lip swelling that started around 830 this morning.  He states it started about 45 minutes after he ate breakfast, though he ate nothing new this morning.  States he took a Benadryl and his symptoms are starting to significantly improve.  Denies associated throat itching or swelling, rashes, chest tightness, wheezing, nausea, diarrhea.  No past history of similar issues.  No new change to medications or facial products.    Past Medical History:  Diagnosis Date   Arthritis    ra, sees dr Alean Rinne   Chronic kidney disease    Coronary artery disease    saw dr Armanda Magic in [place] 20 yrs ago, released from cardiology   Diabetes mellitus without complication [facility])    type 2   Dyspnea 07/2022   Lately r/t CAD   GERD (gastroesophageal reflux disease)    Hypercholesteremia    Hypertension    Peripheral vascular disease (HCC)    Stroke (HCC) 2007   "Light Stroke" per pt. No deficits   Urethral stricture     Patient Active Problem List   Diagnosis Date Noted   Postoperative anemia 07/21/2022   Hospital discharge follow-up 07/21/2022   S/P CABG x 5 07/07/2022   Falls, initial encounter 06/28/2022   Chronic systolic heart failure (HCC) 06/24/2022   Coronary artery disease 06/24/2022   Alternating constipation and diarrhea 03/16/2022   Hip arthritis 03/09/2022   Encounter for examination following treatment at hospital 03/09/2022   Iron deficiency anemia 01/13/2022   Other fatigue 01/13/2022   PAD (peripheral artery disease) (HCC) 11/26/2021   Encounter for general adult medical examination with abnormal findings 11/26/2021   Eczema 11/26/2021   Onychomycosis 11/26/2021   Diabetic polyneuropathy associated with  type 2 diabetes mellitus (HCC) 10/04/2021   Acute pain of right shoulder 09/17/2021   Subclinical hypothyroidism 02/24/2021   Current chronic use of systemic steroids 02/24/2021   Microhematuria 02/03/2021   Internal hemorrhoids 12/22/2020   UTI (urinary tract infection) 10/26/2020   Type II diabetes mellitus with nephropathy (HCC)    Chronic kidney disease, stage III (moderate) (HCC) 07/16/2007   MONOCYTOSIS SYMPTOMATIC 04/23/2007   BPH (benign prostatic hyperplasia) 04/16/2007   Constipation 10/16/2006   Hyperlipidemia LDL goal <100 04/06/2006   Essential hypertension 04/06/2006   Cardiac dysrhythmia 04/06/2006   Allergic rhinitis 04/06/2006   GERD (gastroesophageal reflux disease) 04/06/2006   Diaphragmatic hernia 04/06/2006   Rheumatoid arthritis (HCC) 04/06/2006   LOW BACK PAIN 04/06/2006    Past Surgical History:  Procedure Laterality Date   ABDOMINAL AORTOGRAM W/LOWER EXTREMITY N/A 04/20/2022   Procedure: ABDOMINAL AORTOGRAM W/LOWER EXTREMITY;  Surgeon: Iran Ouch, MD;  Location: [facility];  Service: Cardiovascular;  Laterality: N/A;   BACK SURGERY     x 3 lower back   CARDIAC CATHETERIZATION  2000   small amt plaque relased from cardiology 20 yrs ago   CHOLECYSTECTOMY     COLONOSCOPY WITH PROPOFOL N/A 10/26/2021   Procedure: COLONOSCOPY WITH PROPOFOL;  Surgeon: Lanelle Bal, DO;  Location: [facility];  Service: Endoscopy;  Laterality: N/A;  To arrive at 1200   CORONARY ARTERY BYPASS GRAFT N/A 07/07/2022   Procedure:  CORONARY ARTERY BYPASS GRAFTING (CABG) X5 USING OPEN LEFT INTERNAL MAMMARY ARTERY AND ENDOSCOPICALLY HARVESTED RIGHT GREATER SAPHENOUS VEIN;  Surgeon: Alleen Borne, MD;  Location: [facility];  Service: Open Heart Surgery;  Laterality: N/A;   CYSTOSCOPY WITH URETHRAL DILATATION N/A 04/17/2019   Procedure: CYSTOSCOPY WITH URETHRAL DILATATION, CYSTOGRAM;  Surgeon: Rene Paci, MD;  Location: [facility];  Service:  Urology;  Laterality: N/A;   EYE SURGERY Bilateral    cataracts   IR RADIOLOGIST EVAL & MGMT  07/12/2021   IR RADIOLOGIST EVAL & MGMT  12/14/2022   PERIPHERAL VASCULAR BALLOON ANGIOPLASTY  04/20/2022   Procedure: PERIPHERAL VASCULAR BALLOON ANGIOPLASTY;  Surgeon: Iran Ouch, MD;  Location: [facility];  Service: Cardiovascular;;  aborted; unable to cross   POLYPECTOMY  10/26/2021   Procedure: POLYPECTOMY INTESTINAL;  Surgeon: Lanelle Bal, DO;  Location: [facility];  Service: Endoscopy;;   RIGHT/LEFT HEART CATH AND CORONARY ANGIOGRAPHY Bilateral 06/24/2022   Procedure: RIGHT/LEFT HEART CATH AND CORONARY ANGIOGRAPHY;  Surgeon: Iran Ouch, MD;  Location: [facility];  Service: Cardiovascular;  Laterality: Bilateral;   TEE WITHOUT CARDIOVERSION N/A 07/07/2022   Procedure: TRANSESOPHAGEAL ECHOCARDIOGRAM;  Surgeon: Alleen Borne, MD;  Location: [facility] OR;  Service: Open Heart Surgery;  Laterality: N/A;   urethral stricture surgery  15 yrs ago       Home Medications    Prior to Admission medications   Medication Sig Start Date End Date Taking? Authorizing Provider  cetirizine (ZYRTEC) 5 MG tablet Take 1 tablet (5 mg total) by mouth daily. 08/01/23  Yes Particia Nearing, PA-C  famotidine (PEPCID) 20 MG tablet Take 1 tablet (20 mg total) by mouth 2 (two) times daily. 08/01/23  Yes Particia Nearing, PA-C  acetaminophen (TYLENOL) 325 MG tablet Take 2 tablets (650 mg total) by mouth every 4 (four) hours as needed. 07/12/22   Leary Roca, PA-C  albuterol (VENTOLIN HFA) 108 (90 Base) MCG/ACT inhaler Inhale 1-2 puffs into the lungs every 6 (six) hours as needed for wheezing or shortness of breath.    [provider]  allopurinol (ZYLOPRIM) 100 MG tablet Take 1 tablet (100 mg total) by mouth 2 (two) times daily. 04/12/23   Hyatt, Max T, DPM  aspirin EC 81 MG tablet Take 81 mg by mouth daily. Swallow whole.    [provider]   cholecalciferol (VITAMIN D3) 25 MCG (1000 UNIT) tablet Take 1,000 Units by mouth daily.    [provider]  clopidogrel (PLAVIX) 75 MG tablet Take 1 tablet (75 mg total) by mouth daily. 12/29/22   Iran Ouch, MD  cyanocobalamin (VITAMIN B12) 1000 MCG tablet Take 1,000 mcg by mouth daily.    [provider]  finasteride (PROSCAR) 5 MG tablet Take 1 tablet (5 mg total) by mouth daily. 06/02/23   McKenzie, Mardene Celeste, MD  folic acid (FOLVITE) 1 MG tablet Take 1 mg by mouth daily.    [provider]  gabapentin (NEURONTIN) 300 MG capsule Take 1 capsule (300 mg total) by mouth at bedtime. 08/04/22   Anabel Halon, MD  glipiZIDE (GLUCOTROL) 5 MG tablet TAKE 1 TABLET BY MOUTH TWICE DAILY BEFORE A MEAL 05/22/23   Anabel Halon, MD  hydrochlorothiazide (HYDRODIURIL) 25 MG tablet TAKE 1 TABLET BY MOUTH DAILY 11/02/22   Anabel Halon, MD  hydrocortisone (ANUSOL-HC) 2.5 % rectal cream Place 1 Application rectally 2 (two) times daily. For 10 days. May  repeat cycle if needed. 03/16/22   Tiffany Kocher, PA-C  levocetirizine (XYZAL) 5 MG tablet Take 1 tablet (5 mg total) by mouth every evening. 07/22/22   Anabel Halon, MD  linaclotide [facility]) 145 MCG CAPS capsule Take 145 mcg by mouth daily as needed (constipation).    [provider]  losartan (COZAAR) 100 MG tablet 1 tablet Orally Once a day    [provider]  Menthol, Topical Analgesic, (BLUE-EMU MAXIMUM STRENGTH EX) Apply 1 Application topically daily as needed (pain).    [provider]  methotrexate (RHEUMATREX) 2.5 MG tablet Take 5 tablets (12.5 mg total) by mouth every Sunday. Resume on Sunday 07/24/22. Caution:Chemotherapy. Protect from light. 07/17/22   Leary Roca, PA-C  metoprolol succinate (TOPROL-XL) 100 MG 24 hr tablet Take 1 tablet (100 mg total) by mouth daily. Take with or immediately following a meal. 12/29/22 03/29/23  Iran Ouch, MD  metoprolol tartrate (LOPRESSOR)  50 MG tablet TAKE 1 TABLET BY MOUTH TWICE  DAILY 04/14/23   Anabel Halon, MD  nitrofurantoin, macrocrystal-monohydrate, (MACROBID) 100 MG capsule Take 1 capsule (100 mg total) by mouth every 12 (twelve) hours. 06/02/23   McKenzie, Mardene Celeste, MD  omeprazole (PRILOSEC) 20 MG capsule Take 1 capsule by mouth once daily 05/22/23   Anabel Halon, MD  predniSONE (DELTASONE) 1 MG tablet Take 1 mg by mouth every other day. Takes 1 tablet every other day for 30 days    [provider]  rosuvastatin (CRESTOR) 20 MG tablet Take 1 tablet (20 mg total) by mouth daily. 12/29/22   Iran Ouch, MD  silodosin (RAPAFLO) 8 MG CAPS capsule Take 1 capsule (8 mg total) by mouth at bedtime. 06/02/23   McKenzie, Mardene Celeste, MD  triamcinolone cream (KENALOG) 0.1 % Apply 1 Application topically daily as needed (itching). 11/24/21   Anabel Halon, MD  vitamin E 180 MG (400 UNITS) capsule Take 400 Units by mouth daily.    [provider]    Family History Family History  Problem Relation Age of Onset   Heart Problems Father     Social History Social History   Tobacco Use   Smoking status: Never   Smokeless tobacco: Never  Vaping Use   Vaping status: Never Used  Substance Use Topics   Alcohol use: No   Drug use: No     Allergies   Ciprofloxacin, Mosquito (diagnostic), and Oxycodone-acetaminophen   Review of Systems Review of Systems Per HPI  Physical Exam Triage Vital Signs ED Triage Vitals  Encounter Vitals Group     BP 08/01/23 1334 (!) 142/83     Systolic BP Percentile --      Diastolic BP Percentile --      Pulse Rate 08/01/23 1334 66     Resp 08/01/23 1334 20     Temp 08/01/23 1334 99.1 F (37.3 C)     Temp Source 08/01/23 1334 Oral     SpO2 08/01/23 1334 97 %     Weight --      Height --      Head Circumference --      Peak Flow --      Pain Score 08/01/23 1333 0     Pain Loc --      Pain Education --      Exclude from Growth Chart --    No data  found.  Updated Vital Signs BP (!) 142/83 (BP Location: Right Arm)   Pulse  66   Temp 99.1 F (37.3 C) (Oral)   Resp 20   SpO2 97%   Visual Acuity Right Eye Distance:   Left Eye Distance:   Bilateral Distance:    Right Eye Near:   Left Eye Near:    Bilateral Near:     Physical Exam Vitals and nursing note reviewed.  Constitutional:      Appearance: Normal appearance.  HENT:     Head: Atraumatic.     Mouth/Throat:     Mouth: Mucous membranes are moist.     Pharynx: Oropharynx is clear. No posterior oropharyngeal erythema.     Comments: Mild diffuse edema to the upper lip.  No discoloration or lesions noted Eyes:     Extraocular Movements: Extraocular movements intact.     Conjunctiva/sclera: Conjunctivae normal.  Cardiovascular:     Rate and Rhythm: Normal rate and regular rhythm.  Pulmonary:     Effort: Pulmonary effort is normal.     Breath sounds: Normal breath sounds.  Musculoskeletal:        General: Normal range of motion.     Cervical back: Normal range of motion and neck supple.  Skin:    General: Skin is warm and dry.  Neurological:     General: No focal deficit present.     Mental Status: He is oriented to person, place, and time.  Psychiatric:        Mood and Affect: Mood normal.        Thought Content: Thought content normal.        Judgment: Judgment normal.      UC Treatments / Results  Labs (all labs ordered are listed, but only abnormal results are displayed) Labs Reviewed - No data to display  EKG   Radiology No results found.  Procedures Procedures (including critical care time)  Medications Ordered in UC Medications - No data to display  Initial Impression / Assessment and Plan / UC Course  I have reviewed the triage vital signs and the nursing notes.  Pertinent labs & imaging results that were available during my care of the patient were reviewed by me and considered in my medical decision making (see chart for details).      Suspect mild allergic reaction.  No evidence of anaphylaxis or other generalized symptoms at this time and did respond very well to Benadryl.  Will treat with Pepcid and Zyrtec, ice, avoidance of any potential triggers.  PCP follow-up recommended, ED for worsening symptoms at any time.  Final Clinical Impressions(s) / UC Diagnoses   Final diagnoses:  Lip swelling     Discharge Instructions      Take the prescribed medications until symptoms have fully resolved.  You may also apply ice to the lips to help with swelling.  Follow-up in the emergency department for significantly worsening symptoms anytime.    ED Prescriptions     Medication Sig Dispense Auth. Provider   famotidine (PEPCID) 20 MG tablet Take 1 tablet (20 mg total) by mouth 2 (two) times daily. 20 tablet Particia Nearing, [place]   cetirizine (ZYRTEC) 5 MG tablet Take 1 tablet (5 mg total) by mouth daily. 20 tablet Particia Nearing, [place]      PDMP not reviewed this encounter.   Particia Nearing, [place] 08/01/23 1425

## 2023-09-08 ENCOUNTER — Other Ambulatory Visit: Payer: Self-pay | Admitting: Cardiovascular Disease

## 2023-09-08 DIAGNOSIS — E785 Hyperlipidemia, unspecified: Secondary | ICD-10-CM

## 2023-09-13 ENCOUNTER — Other Ambulatory Visit: Payer: Self-pay | Admitting: Family Medicine

## 2023-09-13 DIAGNOSIS — D696 Thrombocytopenia, unspecified: Secondary | ICD-10-CM

## 2023-09-16 ENCOUNTER — Ambulatory Visit
Admission: EM | Admit: 2023-09-16 | Discharge: 2023-09-16 | Disposition: A | Discharge status: Home / Self Care | Attending: Family Medicine | Admitting: Family Medicine

## 2023-09-16 DIAGNOSIS — R220 Localized swelling, mass and lump, head: Secondary | ICD-10-CM

## 2023-09-16 DIAGNOSIS — R030 Elevated blood-pressure reading, without diagnosis of hypertension: Secondary | ICD-10-CM | POA: Diagnosis not present

## 2023-09-16 DIAGNOSIS — R21 Rash and other nonspecific skin eruption: Secondary | ICD-10-CM | POA: Diagnosis not present

## 2023-09-16 MED ORDER — METHYLPREDNISOLONE SODIUM SUCC 125 MG IJ SOLR
80.0000 mg | Freq: Once | INTRAMUSCULAR | Status: AC
  Administered 2023-09-16: 80 mg via INTRAMUSCULAR

## 2023-09-16 NOTE — ED Triage Notes (Signed)
 Pt reports he has upper lip swelling since this morning, and itching red dots on his arms x 2 weeks    Denies taking any new meds or foods. Reports no extremity weakness or throat closing.

## 2023-09-16 NOTE — Discharge Instructions (Signed)
 I recommend taking an antihistamine such as Zyrtec  daily until symptoms resolve.  You may use Benadryl cream or hydrocortisone  cream as needed for the itchy rash.  We have given you a steroid shot today to help additionally.  If your symptoms worsen at any time go to the emergency department for further evaluation.  Follow-up with primary care next week for recheck

## 2023-09-16 NOTE — ED Provider Notes (Addendum)
 [facility]    CSN: [id number] Arrival date & time: 09/16/23  1033      History   Chief Complaint No chief complaint on file.   HPI Jonathan [name] is a 78 y.o. male.   Patient presenting today with 1 day history of facial redness, itching initially yesterday on the right side of face near temple and now today on the left side extending from bridge of nose under eye to the side of the face, upper lip swelling that started this morning.  Denies throat itching or swelling, chest tightness, shortness of breath, wheezing, nausea, vomiting, new foods or medications, new exposures to outdoor elements.  So far not tried anything over-the-counter for symptoms, but did put some Benadryl cream on the right side of face redness yesterday with good relief.  Does take losartan  for hypertension but states he has been on this medication for many years without issue.    Past Medical History:  Diagnosis Date   Arthritis    ra, sees dr Velna Ghee   Chronic kidney disease    Coronary artery disease    saw dr Alen Amy in [place] 20 yrs ago, released from cardiology   Diabetes mellitus without complication [facility])    type 2   Dyspnea 07/2022   Lately r/t CAD   GERD (gastroesophageal reflux disease)    Hypercholesteremia    Hypertension    Peripheral vascular disease (HCC)    Stroke (HCC) 2007   "Light Stroke" per pt. No deficits   Urethral stricture     Patient Active Problem List   Diagnosis Date Noted   Postoperative anemia 07/21/2022   Hospital discharge follow-up 07/21/2022   S/P CABG x 5 07/07/2022   Falls, initial encounter 06/28/2022   Chronic systolic heart failure (HCC) 06/24/2022   Coronary artery disease 06/24/2022   Alternating constipation and diarrhea 03/16/2022   Hip arthritis 03/09/2022   Encounter for examination following treatment at hospital 03/09/2022   Iron deficiency anemia 01/13/2022   Other fatigue 01/13/2022   PAD (peripheral artery disease)  (HCC) 11/26/2021   Encounter for general adult medical examination with abnormal findings 11/26/2021   Eczema 11/26/2021   Onychomycosis 11/26/2021   Diabetic polyneuropathy associated with type 2 diabetes mellitus (HCC) 10/04/2021   Acute pain of right shoulder 09/17/2021   Subclinical hypothyroidism 02/24/2021   Current chronic use of systemic steroids 02/24/2021   Microhematuria 02/03/2021   Internal hemorrhoids 12/22/2020   UTI (urinary tract infection) 10/26/2020   Type II diabetes mellitus with nephropathy (HCC)    Chronic kidney disease, stage III (moderate) (HCC) 07/16/2007   MONOCYTOSIS SYMPTOMATIC 04/23/2007   BPH (benign prostatic hyperplasia) 04/16/2007   Constipation 10/16/2006   Hyperlipidemia LDL goal <100 04/06/2006   Essential hypertension 04/06/2006   Cardiac dysrhythmia 04/06/2006   Allergic rhinitis 04/06/2006   GERD (gastroesophageal reflux disease) 04/06/2006   Diaphragmatic hernia 04/06/2006   Rheumatoid arthritis (HCC) 04/06/2006   LOW BACK PAIN 04/06/2006    Past Surgical History:  Procedure Laterality Date   ABDOMINAL AORTOGRAM W/LOWER EXTREMITY N/A 04/20/2022   Procedure: ABDOMINAL AORTOGRAM W/LOWER EXTREMITY;  Surgeon: Wenona Hamilton, MD;  Location: [facility];  Service: Cardiovascular;  Laterality: N/A;   BACK SURGERY     x 3 lower back   CARDIAC CATHETERIZATION  2000   small amt plaque relased from cardiology 20 yrs ago   CHOLECYSTECTOMY     COLONOSCOPY WITH PROPOFOL  N/A 10/26/2021   Procedure: COLONOSCOPY WITH PROPOFOL ;  Surgeon: Vinetta Greening, DO;  Location: [facility];  Service: Endoscopy;  Laterality: N/A;  To arrive at 1200   CORONARY ARTERY BYPASS GRAFT N/A 07/07/2022   Procedure: CORONARY ARTERY BYPASS GRAFTING (CABG) X5 USING OPEN LEFT INTERNAL MAMMARY ARTERY AND ENDOSCOPICALLY HARVESTED RIGHT GREATER SAPHENOUS VEIN;  Surgeon: Bartley Lightning, MD;  Location: [facility] OR;  Service: Open Heart Surgery;  Laterality: N/A;   CYSTOSCOPY  WITH URETHRAL DILATATION N/A 04/17/2019   Procedure: CYSTOSCOPY WITH URETHRAL DILATATION, CYSTOGRAM;  Surgeon: Adelbert Homans, MD;  Location: [facility];  Service: Urology;  Laterality: N/A;   EYE SURGERY Bilateral    cataracts   IR RADIOLOGIST EVAL & MGMT  07/12/2021   IR RADIOLOGIST EVAL & MGMT  12/14/2022   PERIPHERAL VASCULAR BALLOON ANGIOPLASTY  04/20/2022   Procedure: PERIPHERAL VASCULAR BALLOON ANGIOPLASTY;  Surgeon: Wenona Hamilton, MD;  Location: [facility];  Service: Cardiovascular;;  aborted; unable to cross   POLYPECTOMY  10/26/2021   Procedure: POLYPECTOMY INTESTINAL;  Surgeon: Vinetta Greening, DO;  Location: [facility];  Service: Endoscopy;;   RIGHT/LEFT HEART CATH AND CORONARY ANGIOGRAPHY Bilateral 06/24/2022   Procedure: RIGHT/LEFT HEART CATH AND CORONARY ANGIOGRAPHY;  Surgeon: Wenona Hamilton, MD;  Location: [facility];  Service: Cardiovascular;  Laterality: Bilateral;   TEE WITHOUT CARDIOVERSION N/A 07/07/2022   Procedure: TRANSESOPHAGEAL ECHOCARDIOGRAM;  Surgeon: Bartley Lightning, MD;  Location: [facility] OR;  Service: Open Heart Surgery;  Laterality: N/A;   urethral stricture surgery  15 yrs ago       Home Medications    Prior to Admission medications   Medication Sig Start Date End Date Taking? Authorizing Provider  acetaminophen  (TYLENOL ) 325 MG tablet Take 2 tablets (650 mg total) by mouth every 4 (four) hours as needed. 07/12/22   Roddenberry, Myron G, PA-C  albuterol (VENTOLIN HFA) 108 (90 Base) MCG/ACT inhaler Inhale 1-2 puffs into the lungs every 6 (six) hours as needed for wheezing or shortness of breath.    [provider]  allopurinol  (ZYLOPRIM ) 100 MG tablet Take 1 tablet (100 mg total) by mouth 2 (two) times daily. 04/12/23   Hyatt, Max T, DPM  aspirin  EC 81 MG tablet Take 81 mg by mouth daily. Swallow whole.    [provider]  cetirizine  (ZYRTEC ) 5 MG tablet Take 1 tablet (5 mg total) by mouth  daily. 08/01/23   Corbin Dess, PA-C  cholecalciferol  (VITAMIN D3) 25 MCG (1000 UNIT) tablet Take 1,000 Units by mouth daily.    [provider]  clopidogrel  (PLAVIX ) 75 MG tablet Take 1 tablet (75 mg total) by mouth daily. 12/29/22   Wenona Hamilton, MD  cyanocobalamin  (VITAMIN B12) 1000 MCG tablet Take 1,000 mcg by mouth daily.    [provider]  famotidine  (PEPCID ) 20 MG tablet Take 1 tablet (20 mg total) by mouth 2 (two) times daily. 08/01/23   Corbin Dess, PA-C  finasteride  (PROSCAR ) 5 MG tablet Take 1 tablet (5 mg total) by mouth daily. 06/02/23   McKenzie, Arden Beck, MD  folic acid  (FOLVITE ) 1 MG tablet Take 1 mg by mouth daily.    [provider]  gabapentin  (NEURONTIN ) 300 MG capsule Take 1 capsule (300 mg total) by mouth at bedtime. 08/04/22   Meldon Sport, MD  glipiZIDE  (GLUCOTROL ) 5 MG tablet TAKE 1 TABLET BY MOUTH TWICE DAILY BEFORE A MEAL 05/22/23   Meldon Sport, MD  hydrochlorothiazide  (HYDRODIURIL ) 25 MG tablet  TAKE 1 TABLET BY MOUTH DAILY 11/02/22   Patel, Rutwik K, MD  hydrocortisone  (ANUSOL -HC) 2.5 % rectal cream Place 1 Application rectally 2 (two) times daily. For 10 days. May repeat cycle if needed. 03/16/22   Lanney Pitts, PA-C  levocetirizine (XYZAL ) 5 MG tablet Take 1 tablet (5 mg total) by mouth every evening. 07/22/22   Meldon Sport, MD  linaclotide [facility]) 145 MCG CAPS capsule Take 145 mcg by mouth daily as needed (constipation).    [provider]  losartan  (COZAAR ) 100 MG tablet 1 tablet Orally Once a day    [provider]  Menthol, Topical Analgesic, (BLUE-EMU MAXIMUM STRENGTH EX) Apply 1 Application topically daily as needed (pain).    [provider]  methotrexate  (RHEUMATREX) 2.5 MG tablet Take 5 tablets (12.5 mg total) by mouth every Sunday. Resume on Sunday 07/24/22. Caution:Chemotherapy. Protect from light. 07/17/22   Roddenberry, Myron G, PA-C  metoprolol  succinate (TOPROL -XL) 100 MG  24 hr tablet Take 1 tablet (100 mg total) by mouth daily. Take with or immediately following a meal. 12/29/22 03/29/23  Wenona Hamilton, MD  metoprolol  tartrate (LOPRESSOR ) 50 MG tablet TAKE 1 TABLET BY MOUTH TWICE  DAILY 04/14/23   Meldon Sport, MD  nitrofurantoin , macrocrystal-monohydrate, (MACROBID ) 100 MG capsule Take 1 capsule (100 mg total) by mouth every 12 (twelve) hours. 06/02/23   McKenzie, Arden Beck, MD  omeprazole  (PRILOSEC) 20 MG capsule Take 1 capsule by mouth once daily 05/22/23   Patel, Rutwik K, MD  predniSONE  (DELTASONE ) 1 MG tablet Take 1 mg by mouth every other day. Takes 1 tablet every other day for 30 days    [provider]  rosuvastatin  (CRESTOR ) 20 MG tablet Take 1 tablet by mouth once daily 09/08/23   Arida, Muhammad A, MD  silodosin  (RAPAFLO ) 8 MG CAPS capsule Take 1 capsule (8 mg total) by mouth at bedtime. 06/02/23   McKenzie, Arden Beck, MD  triamcinolone  cream (KENALOG ) 0.1 % Apply 1 Application topically daily as needed (itching). 11/24/21   Meldon Sport, MD  vitamin E  180 MG (400 UNITS) capsule Take 400 Units by mouth daily.    [provider]    Family History Family History  Problem Relation Age of Onset   Heart Problems Father     Social History Social History   Tobacco Use   Smoking status: Never   Smokeless tobacco: Never  Vaping Use   Vaping status: Never Used  Substance Use Topics   Alcohol  use: No   Drug use: No     Allergies   Ciprofloxacin , Mosquito (diagnostic), and Oxycodone-acetaminophen    Review of Systems Review of Systems Per HPI  Physical Exam Triage Vital Signs ED Triage Vitals [09/16/23 1051]  Encounter Vitals Group     BP (!) 178/93     Systolic BP Percentile      Diastolic BP Percentile      Pulse Rate 67     Resp 18     Temp 98.3 F (36.8 C)     Temp Source Oral     SpO2 94 %     Weight      Height      Head Circumference      Peak Flow      Pain Score 0     Pain Loc      Pain Education       Exclude from Growth Chart    No data found.  Updated Vital Signs BP Aaron Aas)  178/93 (BP Location: Right Arm) Comment: states he did not take his bp meds this morning  Pulse 67   Temp 98.3 F (36.8 C) (Oral)   Resp 18   SpO2 94%   Visual Acuity Right Eye Distance:   Left Eye Distance:   Bilateral Distance:    Right Eye Near:   Left Eye Near:    Bilateral Near:     Physical Exam Vitals and nursing note reviewed.  Constitutional:      Appearance: Normal appearance.  HENT:     Head: Atraumatic.     Mouth/Throat:     Mouth: Mucous membranes are moist.     Pharynx: Oropharynx is clear.     Comments: Trace edema to the upper lip diffusely.  No rashes or lesions to this area.  Oropharynx benign Eyes:     Extraocular Movements: Extraocular movements intact.     Conjunctiva/sclera: Conjunctivae normal.  Cardiovascular:     Rate and Rhythm: Normal rate and regular rhythm.  Pulmonary:     Effort: Pulmonary effort is normal.     Breath sounds: Normal breath sounds. No wheezing or rales.  Musculoskeletal:        General: Normal range of motion.     Cervical back: Normal range of motion and neck supple.  Skin:    General: Skin is warm and dry.     Comments: Trace edema and erythema to the left cheek region  Neurological:     General: No focal deficit present.     Mental Status: He is oriented to person, place, and time.  Psychiatric:        Mood and Affect: Mood normal.        Thought Content: Thought content normal.        Judgment: Judgment normal.      UC Treatments / Results  Labs (all labs ordered are listed, but only abnormal results are displayed) Labs Reviewed - No data to display  EKG   Radiology No results found.  Procedures Procedures (including critical care time)  Medications Ordered in UC Medications  methylPREDNISolone  sodium succinate  (SOLU-MEDROL ) 125 mg/2 mL injection 80 mg (80 mg Intramuscular Given 09/16/23 1202)    Initial Impression /  Assessment and Plan / UC Course  I have reviewed the triage vital signs and the nursing notes.  Pertinent labs & imaging results that were available during my care of the patient were reviewed by me and considered in my medical decision making (see chart for details).     Consistent with mild allergic reaction, no evidence of anaphylaxis or distress at this time.  Trial antihistamines, IM Solu-Medrol , avoidance of any potential triggers.  Very low suspicion for angioedema from losartan  at this time but follow-up with PCP next week for recheck and consideration of this if persisting.  ED for worsening symptoms at any time.  Blood pressure was elevated in triage, discussed to continue monitoring this and logging home readings and follow-up with PCP  Final Clinical Impressions(s) / UC Diagnoses   Final diagnoses:  Lip swelling  Facial rash  Elevated blood pressure reading     Discharge Instructions      I recommend taking an antihistamine such as Zyrtec  daily until symptoms resolve.  You may use Benadryl cream or hydrocortisone  cream as needed for the itchy rash.  We have given you a steroid shot today to help additionally.  If your symptoms worsen at any time go to the emergency department for further evaluation.  Follow-up with primary care next week for recheck  ED Prescriptions   None    PDMP not reviewed this encounter.   Corbin Dess, [place] 09/16/23 1305    Corbin Dess, [place] 09/16/23 1306

## 2023-09-19 ENCOUNTER — Ambulatory Visit (INDEPENDENT_AMBULATORY_CARE_PROVIDER_SITE_OTHER): Payer: 59 | Admitting: Podiatry

## 2023-09-19 ENCOUNTER — Encounter: Payer: Self-pay | Admitting: Podiatry

## 2023-09-19 ENCOUNTER — Ambulatory Visit
Admission: RE | Admit: 2023-09-19 | Discharge: 2023-09-19 | Disposition: A | Discharge status: Home / Self Care | Source: Ambulatory Visit | Attending: Family Medicine | Admitting: Family Medicine

## 2023-09-19 ENCOUNTER — Other Ambulatory Visit

## 2023-09-19 VITALS — Ht 69.0 in | Wt 223.2 lb

## 2023-09-19 DIAGNOSIS — M2041 Other hammer toe(s) (acquired), right foot: Secondary | ICD-10-CM

## 2023-09-19 DIAGNOSIS — M79674 Pain in right toe(s): Secondary | ICD-10-CM | POA: Diagnosis not present

## 2023-09-19 DIAGNOSIS — E1151 Type 2 diabetes mellitus with diabetic peripheral angiopathy without gangrene: Secondary | ICD-10-CM | POA: Diagnosis not present

## 2023-09-19 DIAGNOSIS — M2042 Other hammer toe(s) (acquired), left foot: Secondary | ICD-10-CM | POA: Diagnosis not present

## 2023-09-19 DIAGNOSIS — L84 Corns and callosities: Secondary | ICD-10-CM

## 2023-09-19 DIAGNOSIS — B351 Tinea unguium: Secondary | ICD-10-CM

## 2023-09-19 DIAGNOSIS — M79675 Pain in left toe(s): Secondary | ICD-10-CM

## 2023-09-19 DIAGNOSIS — E119 Type 2 diabetes mellitus without complications: Secondary | ICD-10-CM

## 2023-09-19 DIAGNOSIS — D696 Thrombocytopenia, unspecified: Secondary | ICD-10-CM

## 2023-09-20 ENCOUNTER — Encounter: Payer: Self-pay | Admitting: Podiatry

## 2023-09-20 NOTE — Progress Notes (Signed)
 ANNUAL DIABETIC FOOT EXAM  Subjective: Jonathan [name] presents today for annual diabetic foot exam. He is accompanied by his wife on today's visit. Chief Complaint  Patient presents with   Nail Problem    Pt is here for [facility] unsure of last A1C PCP is Dr Lydia Sams and LOV was last year.   Patient confirms h/o diabetes.  Patient has h/o foot wound LLE which has healed with wound care and vascular team.  Patient has h/o PAD and is followed by [facility]. Has h/o CABG.  Meldon Sport, MD is patient's PCP.  Past Medical History:  Diagnosis Date   Arthritis    ra, sees dr Velna Ghee   Chronic kidney disease    Coronary artery disease    saw dr Alen Amy in [place] 20 yrs ago, released from cardiology   Diabetes mellitus without complication [facility])    type 2   Dyspnea 07/2022   Lately r/t CAD   GERD (gastroesophageal reflux disease)    Hypercholesteremia    Hypertension    Peripheral vascular disease (HCC)    Stroke (HCC) 2007   "Light Stroke" per pt. No deficits   Urethral stricture    Patient Active Problem List   Diagnosis Date Noted   Postoperative anemia 07/21/2022   Hospital discharge follow-up 07/21/2022   S/P CABG x 5 07/07/2022   Falls, initial encounter 06/28/2022   Chronic systolic heart failure (HCC) 06/24/2022   Coronary artery disease 06/24/2022   Alternating constipation and diarrhea 03/16/2022   Hip arthritis 03/09/2022   Encounter for examination following treatment at hospital 03/09/2022   Iron deficiency anemia 01/13/2022   Other fatigue 01/13/2022   PAD (peripheral artery disease) (HCC) 11/26/2021   Encounter for general adult medical examination with abnormal findings 11/26/2021   Eczema 11/26/2021   Onychomycosis 11/26/2021   Diabetic polyneuropathy associated with type 2 diabetes mellitus (HCC) 10/04/2021   Acute pain of right shoulder 09/17/2021   Subclinical hypothyroidism 02/24/2021   Current chronic use of systemic steroids 02/24/2021    Microhematuria 02/03/2021   Internal hemorrhoids 12/22/2020   UTI (urinary tract infection) 10/26/2020   Type II diabetes mellitus with nephropathy (HCC)    Chronic kidney disease, stage III (moderate) (HCC) 07/16/2007   MONOCYTOSIS SYMPTOMATIC 04/23/2007   BPH (benign prostatic hyperplasia) 04/16/2007   Constipation 10/16/2006   Hyperlipidemia LDL goal <100 04/06/2006   Essential hypertension 04/06/2006   Cardiac dysrhythmia 04/06/2006   Allergic rhinitis 04/06/2006   GERD (gastroesophageal reflux disease) 04/06/2006   Diaphragmatic hernia 04/06/2006   Rheumatoid arthritis (HCC) 04/06/2006   LOW BACK PAIN 04/06/2006   Past Surgical History:  Procedure Laterality Date   ABDOMINAL AORTOGRAM W/LOWER EXTREMITY N/A 04/20/2022   Procedure: ABDOMINAL AORTOGRAM W/LOWER EXTREMITY;  Surgeon: Wenona Hamilton, MD;  [facility];  Service: Cardiovascular;  Laterality: N/A;   BACK SURGERY     x 3 lower back   CARDIAC CATHETERIZATION  2000   small amt plaque relased from cardiology 20 yrs ago   CHOLECYSTECTOMY     COLONOSCOPY WITH PROPOFOL  N/A 10/26/2021   Procedure: COLONOSCOPY WITH PROPOFOL ;  Surgeon: Vinetta Greening, DO;  [facility]: [facility];  Service: Endoscopy;  Laterality: N/A;  To arrive at 1200   CORONARY ARTERY BYPASS GRAFT N/A 07/07/2022   Procedure: CORONARY ARTERY BYPASS GRAFTING (CABG) X5 USING OPEN LEFT INTERNAL MAMMARY ARTERY AND ENDOSCOPICALLY HARVESTED RIGHT GREATER SAPHENOUS VEIN;  Surgeon: Bartley Lightning, MD;  Location: [facility] OR;  Service: Open Heart Surgery;  Laterality: N/A;   CYSTOSCOPY WITH URETHRAL DILATATION N/A 04/17/2019   Procedure: CYSTOSCOPY WITH URETHRAL DILATATION, CYSTOGRAM;  Surgeon: Adelbert Homans, MD;  Location: [facility] [place] [facility];  Service: Urology;  Laterality: N/A;   EYE SURGERY Bilateral    cataracts   IR RADIOLOGIST EVAL & MGMT  07/12/2021   IR RADIOLOGIST EVAL & MGMT  12/14/2022   PERIPHERAL VASCULAR BALLOON  ANGIOPLASTY  04/20/2022   Procedure: PERIPHERAL VASCULAR BALLOON ANGIOPLASTY;  Surgeon: Wenona Hamilton, MD;  Location: [facility];  Service: Cardiovascular;;  aborted; unable to cross   POLYPECTOMY  10/26/2021   Procedure: POLYPECTOMY INTESTINAL;  Surgeon: Vinetta Greening, DO;  [facility];  Service: Endoscopy;;   RIGHT/LEFT HEART CATH AND CORONARY ANGIOGRAPHY Bilateral 06/24/2022   Procedure: RIGHT/LEFT HEART CATH AND CORONARY ANGIOGRAPHY;  Surgeon: Wenona Hamilton, MD;  Location: [facility] INVASIVE CV LAB;  Service: Cardiovascular;  Laterality: Bilateral;   TEE WITHOUT CARDIOVERSION N/A 07/07/2022   Procedure: TRANSESOPHAGEAL ECHOCARDIOGRAM;  Surgeon: Bartley Lightning, MD;  Location: [facility] OR;  Service: Open Heart Surgery;  Laterality: N/A;   urethral stricture surgery  15 yrs ago   Current Outpatient Medications on File Prior to Visit  Medication Sig Dispense Refill   acetaminophen  (TYLENOL ) 325 MG tablet Take 2 tablets (650 mg total) by mouth every 4 (four) hours as needed.     albuterol (VENTOLIN HFA) 108 (90 Base) MCG/ACT inhaler Inhale 1-2 puffs into the lungs every 6 (six) hours as needed for wheezing or shortness of breath.     allopurinol  (ZYLOPRIM ) 100 MG tablet Take 1 tablet (100 mg total) by mouth 2 (two) times daily. 60 tablet 6   aspirin  EC 81 MG tablet Take 81 mg by mouth daily. Swallow whole.     cetirizine  (ZYRTEC ) 5 MG tablet Take 1 tablet (5 mg total) by mouth daily. 20 tablet 0   cholecalciferol  (VITAMIN D3) 25 MCG (1000 UNIT) tablet Take 1,000 Units by mouth daily.     clopidogrel  (PLAVIX ) 75 MG tablet Take 1 tablet (75 mg total) by mouth daily. 90 tablet 3   cyanocobalamin  (VITAMIN B12) 1000 MCG tablet Take 1,000 mcg by mouth daily.     famotidine  (PEPCID ) 20 MG tablet Take 1 tablet (20 mg total) by mouth 2 (two) times daily. 20 tablet 0   finasteride  (PROSCAR ) 5 MG tablet Take 1 tablet (5 mg total) by mouth daily. 90 tablet 2   folic acid  (FOLVITE ) 1 MG  tablet Take 1 mg by mouth daily.     gabapentin  (NEURONTIN ) 300 MG capsule Take 1 capsule (300 mg total) by mouth at bedtime. 30 capsule 5   glipiZIDE  (GLUCOTROL ) 5 MG tablet TAKE 1 TABLET BY MOUTH TWICE DAILY BEFORE A MEAL 30 tablet 0   hydrochlorothiazide  (HYDRODIURIL ) 25 MG tablet TAKE 1 TABLET BY MOUTH DAILY 100 tablet 2   hydrocortisone  (ANUSOL -HC) 2.5 % rectal cream Place 1 Application rectally 2 (two) times daily. For 10 days. May repeat cycle if needed. 30 g 1   levocetirizine (XYZAL ) 5 MG tablet Take 1 tablet (5 mg total) by mouth every evening. 30 tablet 5   linaclotide (LINZESS) 145 MCG CAPS capsule Take 145 mcg by mouth daily as needed (constipation).     losartan  (COZAAR ) 100 MG tablet 1 tablet Orally Once a day     Menthol, Topical Analgesic, (BLUE-EMU MAXIMUM STRENGTH EX) Apply 1 Application topically daily as needed (pain).     methotrexate  (  RHEUMATREX) 2.5 MG tablet Take 5 tablets (12.5 mg total) by mouth every Sunday. Resume on Sunday 07/24/22. Caution:Chemotherapy. Protect from light. 5 tablet 0   metoprolol  tartrate (LOPRESSOR ) 50 MG tablet TAKE 1 TABLET BY MOUTH TWICE  DAILY 200 tablet 2   nitrofurantoin , macrocrystal-monohydrate, (MACROBID ) 100 MG capsule Take 1 capsule (100 mg total) by mouth every 12 (twelve) hours. 14 capsule 0   omeprazole  (PRILOSEC) 20 MG capsule Take 1 capsule by mouth once daily 30 capsule 0   predniSONE  (DELTASONE ) 1 MG tablet Take 1 mg by mouth every other day. Takes 1 tablet every other day for 30 days     rosuvastatin  (CRESTOR ) 20 MG tablet Take 1 tablet by mouth once daily 90 tablet 3   silodosin  (RAPAFLO ) 8 MG CAPS capsule Take 1 capsule (8 mg total) by mouth at bedtime. 90 capsule 3   triamcinolone  cream (KENALOG ) 0.1 % Apply 1 Application topically daily as needed (itching). 80 g 1   vitamin E  180 MG (400 UNITS) capsule Take 400 Units by mouth daily.     metoprolol  succinate (TOPROL -XL) 100 MG 24 hr tablet Take 1 tablet (100 mg total) by mouth  daily. Take with or immediately following a meal. 90 tablet 3   No current facility-administered medications on file prior to visit.    Allergies  Allergen Reactions   Ciprofloxacin  Itching   Mosquito (Diagnostic) Itching   Oxycodone-Acetaminophen  Rash   Social History   Occupational History   Not on file  Tobacco Use   Smoking status: Never   Smokeless tobacco: Never  Vaping Use   Vaping status: Never Used  Substance and Sexual Activity   Alcohol  use: No   Drug use: No   Sexual activity: Yes   Family History  Problem Relation Age of Onset   Heart Problems Father    Immunization History  Administered Date(s) Administered   Fluad Quad(high Dose 65+) 03/02/2021, 03/09/2022   Influenza Whole 02/21/2006, 04/16/2007, 02/15/2008   Influenza, High Dose Seasonal PF 01/28/2019   Influenza-Unspecified 01/30/2018   PFIZER(Purple Top)SARS-COV-2 Vaccination 07/04/2019, 07/31/2019   PNEUMOCOCCAL CONJUGATE-20 11/24/2021     Review of Systems: Negative except as noted in the HPI.   Objective: There were no vitals filed for this visit.  Len L Hayashi is a pleasant 78 y.o. male in NAD. AAO X 3.  Diabetic foot exam was performed with the following findings:   Vascular Examination: CFT <4 seconds b/l. DP pulses diminished b/l. PT pulses diminished b/l. Digital hair absent. Skin temperature gradient warm to cool b/l. No ischemia or gangrene. No cyanosis or clubbing noted b/l. Trace edema noted BLE.   Neurological Examination: Sensation grossly intact b/l with 10 gram monofilament. Vibratory sensation intact b/l.   Dermatological Examination: Pedal skin thin, shiny and atrophic b/l. No open wounds. No interdigital macerations.   Toenails 1-5 b/l thick, discolored, elongated with subungual debris and pain on dorsal palpation.   Preulcerative lesion(s) submet head 1 right foot and submet head 5 b/l.  No erythema, no edema, no drainage, no fluctuance.  Musculoskeletal  Examination: Muscle strength 5/5 to all lower extremity muscle groups bilaterally. Hammertoe deformity noted 2-5 b/l. Patient ambulates independent of any assistive aids.  Radiographs: None     Lab Results  Component Value Date   HGBA1C 6.1 (H) 07/05/2022   High Risk  Patient has one or more of the following: Loss of protective sensation Absent pedal pulses Severe Foot deformity History of foot ulcer  Assessment: 1. Pain  due to onychomycosis of toenails of both feet   2. Pre-ulcerative calluses   3. Hammer toes of both feet   4. Type II diabetes mellitus with peripheral circulatory disorder (HCC)   5. Encounter for diabetic foot exam (HCC)     Plan: Orders Placed This Encounter  Procedures   For Home Use Only DME Diabetic Shoe    To [employer]:  Dispense one pair extra depth shoes and 3 pair custom diabetic insoles. Offload calluses submet head 5 bilaterally and submet head 1 right foot.   Orders Placed This Encounter  Procedures   For Home Use Only DME Diabetic Shoe    To [employer] and [employer]st[employer]:  Dispense one pair extra depth shoes and 3 pair custom diabetic insoles. Offload calluses submet head 5 bilaterally and submet head 1 right foot.   FOR HOME USE ONLY DME DIABETIC SHOE  -Patient was evaluated today. All questions/concerns addressed on today's visit. -Patient's family member present. All questions/concerns addressed on today's visit. -Discussed routine follow up with Vascular Surgery and advised to call and schedule appointment for this year. His last visit was August 2024. -Diabetic foot examination performed today. -Continue diabetic foot care principles: inspect feet daily, monitor glucose as recommended by PCP and/or Endocrinologist, and follow prescribed diet per PCP, Endocrinologist and/or dietician. -Patient to continue soft, supportive shoe gear daily. -Rx for [employer] dispensed to patient for one  pair diabetic shoes and 3 pair custom insoles. -Toenails 1-5 b/l were debrided in length and girth with sterile nail nippers and dremel without iatrogenic bleeding.  -Callus(es) submet head 1 right foot and submet head 5 b/l pared utilizing sterile scalpel blade without complication or incident. Total number debrided =3. -Patient/POA to call should there be question/concern in the interim. Return in about 3 months (around 12/20/2023).  Luella Sager, DPM      [place] LOCATION: [address], [place] [address]                   Office [phone]   [facility] LOCATION: [address] [place], [place] [address] Office [phone]

## 2023-10-09 ENCOUNTER — Other Ambulatory Visit: Payer: Self-pay | Admitting: Cardiovascular Disease

## 2023-10-09 DIAGNOSIS — E785 Hyperlipidemia, unspecified: Secondary | ICD-10-CM

## 2023-10-12 NOTE — Progress Notes (Signed)
 Cardiology Clinic Note   Date: 10/13/2023 ID: REFOEL PALLADINO, DOB [birth year], MRN [id number]  Primary Cardiologist:  Antionette Kirks, MD  Chief Complaint   Jonathan [name] is a 78 y.o. male who presents to the clinic today for routine follow up.   Patient Profile   Jonathan [name] is followed by Dr. Alvenia Aus for the history outlined below.      Past medical history significant for: CAD. R/LHC 06/24/2022: Ostial proximal RCA 30%.  RPDA 70%.  Third RPL 100%.  Ostial LAD 90%.  Mid LAD #1 50%, #2 80%.  D2 85%.  D1 80%.  Mid to distal LM 20%.  Ostial proximal LCx 80%.  Mid to distal LCx 50%.  Severe diffuse three-vessel coronary artery disease with involvement of ostial LAD and ostial LCx.  Recommend CT surgery consult for CABG. CABG x 5 07/07/2022: LIMA to LAD.  SVG to diagonal.  Sequential SVG to OM1 and OM 2.  SVG to PDA. Chronic HFmrEF. Echo 06/10/2022: EF 50 to 55%.  No RWMA.  Mild LVH of the basal-septal segment.  Grade I DD.  Normal RV size/function.  Mild MR.  Aortic valve sclerosis without stenosis. R/LHC 06/24/2022: Normal RA pressure, moderate pulmonary hypertension, normal cardiac output. PAD. Abdominal aortogram with lower extremity 04/20/2022: Left lower extremity with mild to moderate distal SFA stenosis with occluded anterior tibial artery proximally, occluded peroneal artery and occluded posterior tibial arteries.  Reconstitution of the anterior tibial artery and posterior tibial arteries via collaterals in the distal portion just above the calf.  Attempted unsuccessful angioplasty of the left anterior tibial artery via the antegrade and retrograde approach due to inability to cross the long calcified occlusion.  Recommend evaluation for left femoropopliteal to posterior tibial artery bypass. ABI 06/15/2023: Moderate right lower extremity arterial disease.  Severe left lower extremity arterial disease.  ABI 0.5 on the right and 0.47 on the left. Hypertension. Hyperlipidemia. Lipid  panel 07/21/2022: LDL 59, HDL 41, TG 91, total 118. GERD. Subclinical hypothyroidism. T2DM. RA. CKD stage III.  In summary, patient had an angiogram in December 2023 which showed all tibial peroneal vessels occluded with reconstitution of the anterior tibial artery posterior tibial artery via collaterals in the distal portion just above the ankle as detailed above.  Attempted angioplasty of the anterior tibial artery was unsuccessful secondary to inability to cross long calcified occlusion.  Dr. Rosalva Comber was consulted for bypass but patient was found to have borderline quality vein conduits.  In February 2024 patient complained of exertional dyspnea and episodes of chest pain.  Echo showed EF 50 to 55% with mild MR.  Lexiscan  was indeterminate risk and showed medium size defect in the inferior lateral location consistent with prior infarct with mild peri-infarct ischemia.  LHC February 2023 showed severe three-vessel CAD.  RHC showed moderate pulmonary hypertension.  Patient underwent CABG x 5 on 07/07/2022.  Patient was last seen in the office by Dr. Alvenia Aus on 12/29/2022 for routine follow-up.  He was doing well at that time with no anginal symptoms.  He reported resolution of lower extremity rest pain.  He did complain of claudication that was not lifestyle limiting.  Metoprolol  tartrate was switched to metoprolol  succinate.     History of Present Illness    Today, patient is accompanied by his wife. Patient denies shortness of breath, dyspnea on exertion, lower extremity edema, orthopnea or PND. He does not weigh at home. No chest pain, pressure, or tightness. He reports occasional mild nerve pain  on either side of sternal incision. No palpitations. He denies claudication. He does have numbness in bilateral feet. When he was last seen in August he had a wound on his little toe that was not healing. He reports he did finally heal shortly after his visit. He continues to inspect his feet regularly for  wounds. His biggest concern today is seasonal allergies. He has only been using Flonase and inquires about taking over the counter oral allergy medication.     ROS: All other systems reviewed and are otherwise negative except as noted in History of Present Illness.  EKGs/Labs Reviewed    EKG Interpretation Date/Time:  Friday October 13 2023 10:43:51 EDT Ventricular Rate:  76 PR Interval:  242 QRS Duration:  74 QT Interval:  390 QTC Calculation: 438 R Axis:   -14  Text Interpretation: Sinus rhythm with 1st degree A-V block Minimal voltage criteria for LVH, may be normal variant ( R in aVL ) When compared with ECG of 08-Jul-2022 06:44, No significant change was found Confirmed by Morey Ar [phone]) on 10/13/2023 10:56:57 AM   03/23/2023: Hemoglobin 12.4; WBC 8.6    Physical Exam    VS:  BP 132/86   Pulse 76   Ht 5' 9 (1.753 m)   Wt 218 lb 6.4 oz (99.1 kg)   SpO2 97%   BMI 32.25 kg/m  , BMI Body mass index is 32.25 kg/m.  GEN: Well nourished, well developed, in no acute distress. Neck: No JVD or carotid bruits. Cardiac:  RRR.  No murmur. No rubs or gallops.   Respiratory:  Respirations regular and unlabored. Clear to auscultation without rales, wheezing or rhonchi. GI: Soft, nontender, nondistended. Extremities: Radials 2+ and equal bilaterally. No clubbing or cyanosis. No edema.  Skin: Warm and dry, no rash. Neuro: Strength intact.  Assessment & Plan   CAD S/p CABG x 05 July 2022.  Patient denies chest pain, pressure or tightness. He has occasional, mild nerve pain on either side of sternal incision. EKG today without acute changes.  - Continue aspirin , Plavix , Toprol , rosuvastatin .  Chronic HFmrEF Echo February 2024 showed EF 50 to 55%, Grade I DD, normal RV size/function, mild MR.  Patient denies lower extremity edema, DOE, orthopnea or PND. He does not weigh at home.  Euvolemic and well compensated on exam. Discussed changing to Entresto. He is concerned about  how insurance will cover it. He would like to hold off on any changes and check with insurance company first. He will contact the office after he obtains that information.  - Continue losartan , Toprol , hydrochlorothiazide .   PAD ABI February 2025 showed moderate right lower extremity arterial disease and severe left lower extremity arterial disease.  Patient denies claudication. He continues to have numbness of bilateral feet. He previously had a poor healing wound on his little toe (at last visit) that healed shortly after. He regularly inspects his feet for wounds.  - Continue aspirin , Plavix , rosuvastatin .  Hypertension BP today 132/86. No dizziness or headaches.  - Continue Toprol , losartan , hydrochlorothiazide .   Hyperlipidemia LDL 59 March 2024, at goal. - Continue rosuvastatin . - Repeat lipid panel and CMP today.   Disposition: Lipid panel and CMP today. Return in 6 months or sooner as needed.          Signed, Lonell Rives. Devine Dant, DNP, NP-C

## 2023-10-13 ENCOUNTER — Encounter: Payer: Self-pay | Admitting: Student

## 2023-10-13 ENCOUNTER — Ambulatory Visit: Attending: Student | Admitting: Student

## 2023-10-13 VITALS — BP 132/86 | HR 76 | Ht 69.0 in | Wt 218.4 lb

## 2023-10-13 DIAGNOSIS — I5022 Chronic systolic (congestive) heart failure: Secondary | ICD-10-CM | POA: Diagnosis not present

## 2023-10-13 DIAGNOSIS — I10 Essential (primary) hypertension: Secondary | ICD-10-CM | POA: Diagnosis not present

## 2023-10-13 DIAGNOSIS — I739 Peripheral vascular disease, unspecified: Secondary | ICD-10-CM

## 2023-10-13 DIAGNOSIS — I2510 Atherosclerotic heart disease of native coronary artery without angina pectoris: Secondary | ICD-10-CM | POA: Diagnosis not present

## 2023-10-13 DIAGNOSIS — Z79899 Other long term (current) drug therapy: Secondary | ICD-10-CM

## 2023-10-13 DIAGNOSIS — E785 Hyperlipidemia, unspecified: Secondary | ICD-10-CM

## 2023-10-13 NOTE — Patient Instructions (Signed)
 Medication Instructions:  Your physician recommends that you continue on your current medications as directed. Please refer to the Current Medication list given to you today.   *If you need a refill on your cardiac medications before your next appointment, please call your pharmacy*  Lab Work: Your provider would like for you to have following labs drawn today CMet, Lipid Panel.   If you have labs (blood work) drawn today and your tests are completely normal, you will receive your results only by: MyChart Message (if you have MyChart) OR A paper copy in the mail If you have any lab test that is abnormal or we need to change your treatment, we will call you to review the results.  Testing/Procedures: None ordered at this time   Follow-Up: At [facility], you and your health needs are our priority.  As part of our continuing mission to provide you with exceptional heart care, our providers are all part of one team.  This team includes your primary Cardiologist (physician) and Advanced Practice Providers or APPs (Physician Assistants and Nurse Practitioners) who all work together to provide you with the care you need, when you need it.  Your next appointment:   6 month(s)  Provider:   You may see Antionette Kirks, MD or one of the following Advanced Practice Providers on your designated Care Team:   Laneta Pintos, NP Gildardo Labrador, PA-C Varney Gentleman, PA-C Cadence Burt, PA-C Ronald Cockayne, NP Morey Ar, NP    We recommend signing up for the patient portal called MyChart.  Sign up information is provided on this After Visit Summary.  MyChart is used to connect with patients for Virtual Visits (Telemedicine).  Patients are able to view lab/test results, encounter notes, upcoming appointments, etc.  Non-urgent messages can be sent to your provider as well.   To learn more about what you can do with MyChart, go to [email or web address].   Other Instructions  Please  contact your insurance provider to find out if they will cover Entresto for you.  You can call us  back with that information at [phone] or send us  a MyChart message with that information.

## 2023-10-14 LAB — LIPID PANEL
Chol/HDL Ratio: 3 ratio (ref 0.0–5.0)
Cholesterol, Total: 123 mg/dL (ref 100–199)
HDL: 41 mg/dL (ref >39)
LDL Chol Calc (NIH): 63 mg/dL (ref 0–99)
Triglycerides: 99 mg/dL (ref 0–149)
VLDL Cholesterol Cal: 19 mg/dL (ref 5–40)

## 2023-10-14 LAB — COMPREHENSIVE METABOLIC PANEL WITH GFR
ALT: 25 IU/L (ref 0–44)
AST: 44 IU/L — ABNORMAL HIGH (ref 0–40)
Albumin: 3.9 g/dL (ref 3.8–4.8)
Alkaline Phosphatase: 74 IU/L (ref 44–121)
BUN/Creatinine Ratio: 14 (ref 10–24)
BUN: 31 mg/dL — ABNORMAL HIGH (ref 8–27)
Bilirubin Total: 0.4 mg/dL (ref 0.0–1.2)
CO2: 17 mmol/L — ABNORMAL LOW (ref 20–29)
Calcium: 9.1 mg/dL (ref 8.6–10.2)
Chloride: 104 mmol/L (ref 96–106)
Creatinine, Ser: 2.15 mg/dL — ABNORMAL HIGH (ref 0.76–1.27)
Globulin, Total: 3.4 g/dL (ref 1.5–4.5)
Glucose: 115 mg/dL — ABNORMAL HIGH (ref 70–99)
Potassium: 4.7 mmol/L (ref 3.5–5.2)
Sodium: 139 mmol/L (ref 134–144)
Total Protein: 7.3 g/dL (ref 6.0–8.5)
eGFR: 31 mL/min/{1.73_m2} — ABNORMAL LOW (ref >59)

## 2023-10-16 ENCOUNTER — Ambulatory Visit: Payer: Self-pay | Admitting: Student

## 2023-10-24 ENCOUNTER — Encounter (HOSPITAL_COMMUNITY): Payer: Self-pay | Admitting: Speech Pathology

## 2023-10-24 ENCOUNTER — Ambulatory Visit (HOSPITAL_COMMUNITY): Attending: Family Medicine | Admitting: Speech Pathology

## 2023-10-24 ENCOUNTER — Other Ambulatory Visit: Payer: Self-pay

## 2023-10-24 DIAGNOSIS — R1312 Dysphagia, oropharyngeal phase: Secondary | ICD-10-CM | POA: Diagnosis present

## 2023-10-24 DIAGNOSIS — R058 Other specified cough: Secondary | ICD-10-CM | POA: Diagnosis present

## 2023-10-24 NOTE — Therapy (Signed)
 OUTPATIENT SPEECH LANGUAGE PATHOLOGY SWALLOW EVALUATION   Patient Name: Jonathan Jonathan MRN: [id number] DOB:1946-02-25, 78 y.o., male 36 Date: 10/24/2023  PCP: Jonathan Leni Edyth DELENA, MD REFERRING PROVIDER: Maree Leni Edyth DELENA, MD  END OF SESSION:  End of Session - 10/24/23 1551     Visit Number 1    Number of Visits 2    Authorization Type [employer] Medicare Dual complete    SLP Start Time 1520    SLP Stop Time  1600    SLP Time Calculation (min) 40 min    Activity Tolerance Patient tolerated treatment well          Past Medical History:  Diagnosis Date   Arthritis    ra, sees dr lynwood bent   Chronic kidney disease    Coronary artery disease    saw dr orren in [place] 20 yrs ago, released from cardiology   Diabetes mellitus without complication (HCC)    type 2   Dyspnea 07/2022   Lately r/t CAD   GERD (gastroesophageal reflux disease)    Hypercholesteremia    Hypertension    Peripheral vascular disease (HCC)    Stroke (HCC) 2007   Light Stroke per pt. No deficits   Urethral stricture    Past Surgical History:  Procedure Laterality Date   ABDOMINAL AORTOGRAM W/LOWER EXTREMITY N/A 04/20/2022   Procedure: ABDOMINAL AORTOGRAM W/LOWER EXTREMITY;  Surgeon: Jonathan Deatrice DELENA, MD;  Location: [facility];  Service: Cardiovascular;  Laterality: N/A;   BACK SURGERY     x 3 lower back   CARDIAC CATHETERIZATION  2000   small amt plaque relased from cardiology 20 yrs ago   CHOLECYSTECTOMY     COLONOSCOPY WITH PROPOFOL  N/A 10/26/2021   Procedure: COLONOSCOPY WITH PROPOFOL ;  Surgeon: Jonathan Carlin POUR, DO;  Location: [facility];  Service: Endoscopy;  Laterality: N/A;  To arrive at 1200   CORONARY ARTERY BYPASS GRAFT N/A 07/07/2022   Procedure: CORONARY ARTERY BYPASS GRAFTING (CABG) X5 USING OPEN LEFT INTERNAL MAMMARY ARTERY AND ENDOSCOPICALLY HARVESTED RIGHT GREATER SAPHENOUS VEIN;  Surgeon: Jonathan Dorise POUR, MD;  Location: [facility];  Service: Open Heart Surgery;   Laterality: N/A;   CYSTOSCOPY WITH URETHRAL DILATATION N/A 04/17/2019   Procedure: CYSTOSCOPY WITH URETHRAL DILATATION, CYSTOGRAM;  Surgeon: Jonathan Lonni Righter, MD;  Location: [facility];  Service: Urology;  Laterality: N/A;   EYE SURGERY Bilateral    cataracts   IR RADIOLOGIST EVAL & MGMT  07/12/2021   IR RADIOLOGIST EVAL & MGMT  12/14/2022   PERIPHERAL VASCULAR BALLOON ANGIOPLASTY  04/20/2022   Procedure: PERIPHERAL VASCULAR BALLOON ANGIOPLASTY;  Surgeon: Jonathan Deatrice DELENA, MD;  Location: [facility];  Service: Cardiovascular;;  aborted; unable to cross   POLYPECTOMY  10/26/2021   Procedure: POLYPECTOMY INTESTINAL;  Surgeon: Jonathan Carlin POUR, DO;  Location: [facility];  Service: Endoscopy;;   RIGHT/LEFT HEART CATH AND CORONARY ANGIOGRAPHY Bilateral 06/24/2022   Procedure: RIGHT/LEFT HEART CATH AND CORONARY ANGIOGRAPHY;  Surgeon: Jonathan Deatrice DELENA, MD;  Location: [facility];  Service: Cardiovascular;  Laterality: Bilateral;   TEE WITHOUT CARDIOVERSION N/A 07/07/2022   Procedure: TRANSESOPHAGEAL ECHOCARDIOGRAM;  Surgeon: Jonathan Dorise POUR, MD;  Location: [facility] OR;  Service: Open Heart Surgery;  Laterality: N/A;   urethral stricture surgery  15 yrs ago   Patient Active Problem List   Diagnosis Date Noted   Postoperative anemia 07/21/2022   Hospital discharge follow-up 07/21/2022   S/P CABG x 5  07/07/2022   Falls, initial encounter 06/28/2022   Chronic systolic heart failure (HCC) 06/24/2022   Coronary artery disease 06/24/2022   Alternating constipation and diarrhea 03/16/2022   Hip arthritis 03/09/2022   Encounter for examination following treatment at hospital 03/09/2022   Iron deficiency anemia 01/13/2022   Other fatigue 01/13/2022   PAD (peripheral artery disease) (HCC) 11/26/2021   Encounter for general adult medical examination with abnormal findings 11/26/2021   Eczema 11/26/2021   Onychomycosis 11/26/2021   Diabetic polyneuropathy associated  with type 2 diabetes mellitus (HCC) 10/04/2021   Acute pain of right shoulder 09/17/2021   Subclinical hypothyroidism 02/24/2021   Current chronic use of systemic steroids 02/24/2021   Microhematuria 02/03/2021   Internal hemorrhoids 12/22/2020   UTI (urinary tract infection) 10/26/2020   Type II diabetes mellitus with nephropathy (HCC)    Chronic kidney disease, stage III (moderate) (HCC) 07/16/2007   MONOCYTOSIS SYMPTOMATIC 04/23/2007   BPH (benign prostatic hyperplasia) 04/16/2007   Constipation 10/16/2006   Hyperlipidemia LDL goal <100 04/06/2006   Essential hypertension 04/06/2006   Cardiac dysrhythmia 04/06/2006   Allergic rhinitis 04/06/2006   GERD (gastroesophageal reflux disease) 04/06/2006   Diaphragmatic hernia 04/06/2006   Rheumatoid arthritis (HCC) 04/06/2006   LOW BACK PAIN 04/06/2006    ONSET DATE: ~July 2024   REFERRING DIAG: R13.10 (ICD-10-CM) - Dysphagia, unspecified  THERAPY DIAG:  Dysphagia, oropharyngeal phase  Rationale for Evaluation and Treatment: Rehabilitation  SUBJECTIVE:   SUBJECTIVE STATEMENT: Some foods feel like they don't want to go down.  Pt accompanied by: self and significant other  PERTINENT HISTORY: Jonathan Jonathan is a 78 yo male who was referred by Dr. Maree for a clinical swallow evaluation due to Pt reports of difficulty swallowing solid foods. He had an UGI in 12/2022 (radiologist recommended MBSS with SLP) and another UGI in 07/2023 (see results below). He reports a history of stroke in ~2007 without residual deficits (had slurred speech, twisted face). Pt tells SLP that he wears U/L dentures that fit well, but are dull. He is reaching out to his dentist to see about a new set. He indicates that he has trouble swallowing bread and meat (bacon) and does well with soft foods and pills.   PAIN:  Are you having pain? No  FALLS: Has patient fallen in last 6 months?  No  LIVING ENVIRONMENT: Lives with: lives with their spouse Lives  in: House/apartment  PLOF:  Level of assistance: Independent with ADLs, Independent with IADLs Employment: Retired  PATIENT GOALS: Improve swallow  OBJECTIVE:  Note: Objective measures were completed at Evaluation unless otherwise noted. OBJECTIVE:   DIAGNOSTIC FINDINGS:   UGI 12/16/2022: IMPRESSION: 1. No significant abnormality of the esophagus, stomach, or doudenum. No hiatal hernia. No significant reflux. 2. Significant vallecular and pyriform sinus residue noted during cervical esophagram. Patient would benefit from speech pathologist evaluation. No aspiration or penetration.  UGI 07/06/2023: IMPRESSION: 1. Prominent vallecular, piriform sinus, and hypopharynx residual again noted with evidence of penetration. No aspiration. Recommend speech pathology evaluation with modified barium swallow study. 2. Moderate gastroesophageal reflux. 3. Tiny sliding hiatal hernia.  COGNITION: Overall cognitive status: Within functional limits for tasks assessed  SUBJECTIVE DYSPHAGIA REPORTS:  Date of onset: 8/20024 Reported symptoms: coughing with solids, globus sensation, and difficulty chewing foods  Current diet: regular and thin liquids  Co-morbid voice changes: No  FACTORS WHICH MAY INCREASE RISK OF ADVERSE EVENT IN PRESENCE OF ASPIRATION:  General health: well appearing  Risk factors: none evident  ORAL MOTOR EXAMINATION: Overall status: WFL Comments: N/A  CLINICAL SWALLOW ASSESSMENT:   Dentition: dentures (upper), dentures (lower) , and Pt states they are dull Vocal quality at baseline: normal Patient directly observed with POs: Yes: regular, dysphagia 3 (soft), and thin liquids  Feeding: able to feed self Liquids provided by: cup [facility] Swallow Protocol: Pass Oral phase signs and symptoms: N/A Pharyngeal phase signs and symptoms: multiple swallows and delayed throat clear                                                                                                                       TREATMENT DATE: 10/24/23 Clinical swallow evaluation completed this date. Plan for MBSS  PATIENT EDUCATION: Education details: Pt encouraged to keep food/swallow journal until MBSS, swallow 2x/each bite/sip, effortful/hard swallow Person educated: Patient and Spouse Education method: Explanation, Demonstration, and Handouts Education comprehension: verbalized understanding   ASSESSMENT:  CLINICAL IMPRESSION: Patient is a 78 y.o. male who was seen today for a clinical swallow evaluation at the request of his PCP, Dr. Maree. Pt presents with mild oral phase dysphagia in setting of U/L dull dentures and suspected pharyngeal phase dysphagia (Pt with multiple swallows, occasional delayed throat clearing, reports of globus, and evidence of vallecular and pyriform sinus residue on his UGI studies). Recommend regular textures (chop meats and add moisture to dry solids) and thin liquids with Pt to swallow 2x for each bite/sip and adhere to reflux precautions. Plan for MBSS.   OBJECTIVE IMPAIRMENTS: include dysphagia. These impairments are limiting patient from safety when swallowing. Factors affecting potential to achieve goals and functional outcome are none. Patient will benefit from skilled SLP services to address above impairments and improve overall function.  REHAB POTENTIAL: Excellent   GOALS: Goals reviewed with patient? Yes  SHORT TERM GOALS: Target date: ~3 weeks  MBSS to objectively evaluate swallow, identify appropriate strategies as needed, and determine plan of care with Pt Baseline: Goal status: INITIAL  PLAN:  SLP FREQUENCY: MBSS  PLANNED INTERVENTIONS: Aspiration precaution training, Pharyngeal strengthening exercises, SLP instruction and feedback, Compensatory strategies, Patient/family education, [phone] Treatment of swallowing function, and MBSS   Thank you,  Lamar Candy, CCC-SLP [phone]  Lavenia Stumpo, CCC-SLP 10/24/2023, 4:11  PM

## 2023-10-24 NOTE — Addendum Note (Signed)
 Addended by: FAYETTE LAMAR GAILS on: 10/24/2023 04:52 PM   Modules accepted: Orders

## 2023-10-25 ENCOUNTER — Other Ambulatory Visit (HOSPITAL_COMMUNITY): Payer: Self-pay | Admitting: Occupational Therapy

## 2023-10-26 NOTE — Telephone Encounter (Signed)
 Letter sent.

## 2023-10-30 ENCOUNTER — Other Ambulatory Visit (HOSPITAL_COMMUNITY): Payer: Self-pay | Admitting: Nephrology

## 2023-10-30 DIAGNOSIS — N1832 Chronic kidney disease, stage 3b: Secondary | ICD-10-CM

## 2023-10-30 DIAGNOSIS — E1122 Type 2 diabetes mellitus with diabetic chronic kidney disease: Secondary | ICD-10-CM

## 2023-10-30 DIAGNOSIS — R809 Proteinuria, unspecified: Secondary | ICD-10-CM

## 2023-11-02 ENCOUNTER — Other Ambulatory Visit (HOSPITAL_COMMUNITY): Payer: Self-pay | Admitting: Occupational Therapy

## 2023-11-02 DIAGNOSIS — R059 Cough, unspecified: Secondary | ICD-10-CM

## 2023-11-02 DIAGNOSIS — R1312 Dysphagia, oropharyngeal phase: Secondary | ICD-10-CM

## 2023-11-07 ENCOUNTER — Ambulatory Visit (HOSPITAL_COMMUNITY)
Admission: RE | Admit: 2023-11-07 | Discharge: 2023-11-07 | Disposition: A | Discharge status: Home / Self Care | Source: Ambulatory Visit | Attending: Nephrology | Admitting: Nephrology

## 2023-11-07 DIAGNOSIS — N1832 Chronic kidney disease, stage 3b: Secondary | ICD-10-CM | POA: Insufficient documentation

## 2023-11-07 DIAGNOSIS — R809 Proteinuria, unspecified: Secondary | ICD-10-CM | POA: Insufficient documentation

## 2023-11-07 DIAGNOSIS — E1122 Type 2 diabetes mellitus with diabetic chronic kidney disease: Secondary | ICD-10-CM | POA: Insufficient documentation

## 2023-11-08 ENCOUNTER — Encounter (HOSPITAL_COMMUNITY): Admitting: Speech Pathology

## 2023-11-08 ENCOUNTER — Telehealth (HOSPITAL_COMMUNITY): Payer: Self-pay | Admitting: Speech Pathology

## 2023-11-08 ENCOUNTER — Other Ambulatory Visit (HOSPITAL_COMMUNITY)

## 2023-11-08 NOTE — Telephone Encounter (Signed)
 Telephone Call  Pt did not show for his 11:30 AM MBSS appointment. SLP called the number listed, but his voice mailbox was full.   Thank you,  Lamar Candy, CCC-SLP [phone]

## 2023-11-12 ENCOUNTER — Other Ambulatory Visit: Payer: Self-pay | Admitting: Internal Medicine

## 2023-11-12 DIAGNOSIS — N400 Enlarged prostate without lower urinary tract symptoms: Secondary | ICD-10-CM

## 2023-11-13 ENCOUNTER — Other Ambulatory Visit (HOSPITAL_COMMUNITY)
Admission: RE | Admit: 2023-11-13 | Discharge: 2023-11-13 | Disposition: A | Discharge status: Home / Self Care | Source: Ambulatory Visit | Attending: Nephrology | Admitting: Nephrology

## 2023-11-13 DIAGNOSIS — N189 Chronic kidney disease, unspecified: Secondary | ICD-10-CM | POA: Insufficient documentation

## 2023-11-13 LAB — RENAL FUNCTION PANEL
Albumin: 3.4 g/dL — ABNORMAL LOW (ref 3.5–5.0)
Anion gap: 14 (ref 5–15)
BUN: 40 mg/dL — ABNORMAL HIGH (ref 8–23)
CO2: 20 mmol/L — ABNORMAL LOW (ref 22–32)
Calcium: 8.8 mg/dL — ABNORMAL LOW (ref 8.9–10.3)
Chloride: 106 mmol/L (ref 98–111)
Creatinine, Ser: 2.2 mg/dL — ABNORMAL HIGH (ref 0.61–1.24)
GFR, Estimated: 30 mL/min — ABNORMAL LOW (ref >60)
Glucose, Bld: 76 mg/dL (ref 70–99)
Phosphorus: 3.1 mg/dL (ref 2.5–4.6)
Potassium: 3.8 mmol/L (ref 3.5–5.1)
Sodium: 140 mmol/L (ref 135–145)

## 2023-11-17 NOTE — Telephone Encounter (Signed)
 NP appt scheduled for 02/26/2024 at 1:30 PM with a 1:00 PM arrival time. Confirmed address. NP packet mailed. Informed referring.

## 2023-12-01 ENCOUNTER — Ambulatory Visit: Payer: 59 | Admitting: Urology

## 2023-12-01 ENCOUNTER — Ambulatory Visit (HOSPITAL_COMMUNITY)
Admission: RE | Admit: 2023-12-01 | Discharge: 2023-12-01 | Disposition: A | Discharge status: Home / Self Care | Source: Ambulatory Visit | Attending: Family Medicine | Admitting: Family Medicine

## 2023-12-01 ENCOUNTER — Encounter: Payer: Self-pay | Admitting: Urology

## 2023-12-01 ENCOUNTER — Other Ambulatory Visit (HOSPITAL_COMMUNITY)

## 2023-12-01 ENCOUNTER — Ambulatory Visit (HOSPITAL_COMMUNITY): Attending: Family Medicine | Admitting: Speech Pathology

## 2023-12-01 VITALS — BP 179/75 | HR 64

## 2023-12-01 DIAGNOSIS — R059 Cough, unspecified: Secondary | ICD-10-CM | POA: Diagnosis present

## 2023-12-01 DIAGNOSIS — N1330 Unspecified hydronephrosis: Secondary | ICD-10-CM

## 2023-12-01 DIAGNOSIS — R351 Nocturia: Secondary | ICD-10-CM

## 2023-12-01 DIAGNOSIS — R1312 Dysphagia, oropharyngeal phase: Secondary | ICD-10-CM | POA: Diagnosis present

## 2023-12-01 DIAGNOSIS — N400 Enlarged prostate without lower urinary tract symptoms: Secondary | ICD-10-CM | POA: Diagnosis not present

## 2023-12-01 DIAGNOSIS — N3001 Acute cystitis with hematuria: Secondary | ICD-10-CM

## 2023-12-01 DIAGNOSIS — N323 Diverticulum of bladder: Secondary | ICD-10-CM

## 2023-12-01 LAB — MICROSCOPIC EXAMINATION

## 2023-12-01 LAB — URINALYSIS, ROUTINE W REFLEX MICROSCOPIC
Bilirubin, UA: NEGATIVE
Glucose, UA: NEGATIVE
Ketones, UA: NEGATIVE
Leukocytes,UA: NEGATIVE
Nitrite, UA: NEGATIVE
Protein,UA: NEGATIVE
Specific Gravity, UA: 1.015 (ref 1.005–1.030)
Urobilinogen, Ur: 0.2 mg/dL (ref 0.2–1.0)
pH, UA: 6 (ref 5.0–7.5)

## 2023-12-01 LAB — BLADDER SCAN AMB NON-IMAGING: Scan Result: 199

## 2023-12-01 NOTE — Patient Instructions (Signed)
 Hydronephrosis  Hydronephrosis is the swelling of one or both kidneys due to a blockage that stops urine from flowing out of the body. Kidneys filter waste from the blood and produce urine. This condition can lead to kidney failure and may become life-threatening if not treated promptly. What are the causes? In infants and children, common causes include problems that occur when a baby is developing in the womb. These can include problems in the kidneys or in the tubes that drain urine into the bladder (ureters). In adults, common causes include: Kidney stones. Pregnancy. A tumor or cyst in the abdomen or pelvis. An enlarged prostate gland. Other causes include: Bladder infection. Scar tissue from a previous surgery or injury. A blood clot. Cancer of the prostate, bladder, uterus, ovary, or colon. What are the signs or symptoms? Symptoms of this condition include: Pain or discomfort in your side (flank) or abdomen. Swelling in your abdomen. Nausea and vomiting. Fever. Pain when passing urine. Feelings of urgency when you need to urinate. Urinating more often than normal. In some cases, you may not have any symptoms. How is this diagnosed? This condition may be diagnosed based on: Your symptoms and medical history. A physical exam. Blood and urine tests. Imaging tests, such as an ultrasound, CT scan, or MRI. A procedure to look at your urinary tract and bladder by inserting a scope into the urethra (cystoscopy). How is this treated? Treatment for this condition depends on where the blockage is, how long it has been there, and what caused it. The goal of treatment is to remove the blockage. Treatment may include: Antibiotic medicines to treat or prevent infection. A procedure to place a small, thin tube (stent) into a blocked ureter. The stent will keep the ureter open so that urine can drain through it. A nonsurgical procedure that crushes kidney stones with shock waves  (extracorporeal shock wave lithotripsy). If kidney failure occurs, treatment may include dialysis or a kidney transplant. Follow these instructions at home:  Take over-the-counter and prescription medicines only as told by your health care provider. If you were prescribed an antibiotic medicine, take it exactly as told by your health care provider. Do not stop taking the antibiotic even if you start to feel better. Rest and return to your normal activities as told by your health care provider. Ask your health care provider what activities are safe for you. Drink enough fluid to keep your urine pale yellow. Keep all follow-up visits. This is important. Contact a health care provider if: You continue to have symptoms after treatment. You develop new symptoms. Your urine becomes cloudy or bloody. You have a fever. Get help right away if: You have severe flank or abdominal pain. You cannot drink fluids without vomiting. Summary Hydronephrosis is the swelling of one or both kidneys due to a blockage that stops urine from flowing out of the body. Hydronephrosis can lead to kidney failure and may become life-threatening if not treated promptly. The goal of treatment is to remove the blockage. It may include a procedure to insert a stent into a blocked ureter, a procedure to break up kidney stones, or taking antibiotic medicines. Follow your health care provider's instructions for taking care of yourself at home, including instructions about drinking fluids, taking medicines, and limiting activities. This information is not intended to replace advice given to you by your health care provider. Make sure you discuss any questions you have with your health care provider. Document Revised: 01/17/2023 Document Reviewed: 01/17/2023 Elsevier  Patient Education  2024 [employer].

## 2023-12-01 NOTE — Progress Notes (Signed)
 12/01/2023 12:09 PM   Jonathan [name] 05-18-1945 [id number]  Referring provider: Tobie Suzzane POUR, MD [address] [place],  [place] [address]  Followup BPH   HPI: Jonathan [name] is a 77yo here for followup for BPH with nocturia. PVR 199cc. UA today shows RBCs and few bacteria. Renal US  7/8 showed bilateral mild to moderate hydronephrosis post void. IPSS 15 QOL 3    PMH: Past Medical History:  Diagnosis Date   Arthritis    ra, sees dr lynwood bent   Chronic kidney disease    Coronary artery disease    saw dr orren in [place] 20 yrs ago, released from cardiology   Diabetes mellitus without complication [facility])    type 2   Dyspnea 07/2022   Lately r/t CAD   GERD (gastroesophageal reflux disease)    Hypercholesteremia    Hypertension    Peripheral vascular disease (HCC)    Stroke (HCC) 2007   Light Stroke per pt. No deficits   Urethral stricture     Surgical History: Past Surgical History:  Procedure Laterality Date   ABDOMINAL AORTOGRAM W/LOWER EXTREMITY N/A 04/20/2022   Procedure: ABDOMINAL AORTOGRAM W/LOWER EXTREMITY;  Surgeon: Darron Deatrice LABOR, MD;  Location: [facility];  Service: Cardiovascular;  Laterality: N/A;   BACK SURGERY     x 3 lower back   CARDIAC CATHETERIZATION  2000   small amt plaque relased from cardiology 20 yrs ago   CHOLECYSTECTOMY     COLONOSCOPY WITH PROPOFOL  N/A 10/26/2021   Procedure: COLONOSCOPY WITH PROPOFOL ;  Surgeon: Cindie Carlin POUR, DO;  Location: [facility];  Service: Endoscopy;  Laterality: N/A;  To arrive at 1200   CORONARY ARTERY BYPASS GRAFT N/A 07/07/2022   Procedure: CORONARY ARTERY BYPASS GRAFTING (CABG) X5 USING OPEN LEFT INTERNAL MAMMARY ARTERY AND ENDOSCOPICALLY HARVESTED RIGHT GREATER SAPHENOUS VEIN;  Surgeon: [name] Dorise POUR, MD;  Location: [facility] OR;  Service: Open Heart Surgery;  Laterality: N/A;   CYSTOSCOPY WITH URETHRAL DILATATION N/A 04/17/2019   Procedure: CYSTOSCOPY WITH URETHRAL DILATATION, CYSTOGRAM;   Surgeon: Devere Lonni Righter, MD;  Location: [facility];  Service: Urology;  Laterality: N/A;   EYE SURGERY Bilateral    cataracts   IR RADIOLOGIST EVAL & MGMT  07/12/2021   IR RADIOLOGIST EVAL & MGMT  12/14/2022   PERIPHERAL VASCULAR BALLOON ANGIOPLASTY  04/20/2022   Procedure: PERIPHERAL VASCULAR BALLOON ANGIOPLASTY;  Surgeon: Darron Deatrice LABOR, MD;  Location: [facility];  Service: Cardiovascular;;  aborted; unable to cross   POLYPECTOMY  10/26/2021   Procedure: POLYPECTOMY INTESTINAL;  Surgeon: Cindie Carlin POUR, DO;  [facility]: [facility];  Service: Endoscopy;;   RIGHT/LEFT HEART CATH AND CORONARY ANGIOGRAPHY Bilateral 06/24/2022   Procedure: RIGHT/LEFT HEART CATH AND CORONARY ANGIOGRAPHY;  Surgeon: Darron Deatrice LABOR, MD;  Location: [facility] INVASIVE CV LAB;  Service: Cardiovascular;  Laterality: Bilateral;   TEE WITHOUT CARDIOVERSION N/A 07/07/2022   Procedure: TRANSESOPHAGEAL ECHOCARDIOGRAM;  Surgeon: [name] Dorise POUR, MD;  Location: [facility] OR;  Service: Open Heart Surgery;  Laterality: N/A;   urethral stricture surgery  15 yrs ago    Home Medications:  Allergies as of 12/01/2023       Reactions   Ciprofloxacin  Itching   Mosquito (diagnostic) Itching   Oxycodone-acetaminophen  Rash        Medication List        Accurate as of December 01, 2023 12:09 PM. If you have any questions, ask your nurse or doctor.  acetaminophen  325 MG tablet Commonly known as: TYLENOL  Take 2 tablets (650 mg total) by mouth every 4 (four) hours as needed.   albuterol 108 (90 Base) MCG/ACT inhaler Commonly known as: VENTOLIN HFA Inhale 1-2 puffs into the lungs every 6 (six) hours as needed for wheezing or shortness of breath.   allopurinol  100 MG tablet Commonly known as: ZYLOPRIM  Take 1 tablet (100 mg total) by mouth 2 (two) times daily.   aspirin  EC 81 MG tablet Take 81 mg by mouth daily. Swallow whole.   BLUE-EMU MAXIMUM STRENGTH EX Apply 1 Application topically  daily as needed (pain).   cholecalciferol  25 MCG (1000 UNIT) tablet Commonly known as: VITAMIN D3 Take 1,000 Units by mouth daily.   clopidogrel  75 MG tablet Commonly known as: PLAVIX  TAKE 1 TABLET BY MOUTH ONCE DAILY   cyanocobalamin  1000 MCG tablet Commonly known as: VITAMIN B12 Take 1,000 mcg by mouth daily.   famotidine  20 MG tablet Commonly known as: Pepcid  Take 1 tablet (20 mg total) by mouth 2 (two) times daily.   finasteride  5 MG tablet Commonly known as: PROSCAR  Take 1 tablet (5 mg total) by mouth daily.   folic acid  1 MG tablet Commonly known as: FOLVITE  Take 1 mg by mouth daily.   gabapentin  300 MG capsule Commonly known as: NEURONTIN  Take 1 capsule (300 mg total) by mouth at bedtime.   glipiZIDE  5 MG tablet Commonly known as: GLUCOTROL  TAKE 1 TABLET BY MOUTH TWICE DAILY BEFORE A MEAL   hydrochlorothiazide  25 MG tablet Commonly known as: HYDRODIURIL  TAKE 1 TABLET BY MOUTH DAILY   hydrocortisone  2.5 % rectal cream Commonly known as: ANUSOL -HC Place 1 Application rectally 2 (two) times daily. For 10 days. May repeat cycle if needed.   Linzess 145 MCG Caps capsule Generic drug: linaclotide Take 145 mcg by mouth daily as needed (constipation).   losartan  100 MG tablet Commonly known as: COZAAR  1 tablet Orally Once a day   methotrexate  2.5 MG tablet Commonly known as: RHEUMATREX Take 5 tablets (12.5 mg total) by mouth every Sunday. Resume on Sunday 07/24/22. Caution:Chemotherapy. Protect from light.   metoprolol  succinate 100 MG 24 hr tablet Commonly known as: TOPROL -XL TAKE 1 TABLET BY MOUTH ONCE DAILY WITH OR IMMEDIATELY FOLLOWING A MEAL   rosuvastatin  20 MG tablet Commonly known as: CRESTOR  TAKE 1 TABLET BY MOUTH ONCE DAILY   silodosin  8 MG Caps capsule Commonly known as: RAPAFLO  Take 1 capsule (8 mg total) by mouth at bedtime.   triamcinolone  cream 0.1 % Commonly known as: KENALOG  Apply 1 Application topically daily as needed (itching).    vitamin E  180 MG (400 UNITS) capsule Take 400 Units by mouth daily.        Allergies:  Allergies  Allergen Reactions   Ciprofloxacin  Itching   Mosquito (Diagnostic) Itching   Oxycodone-Acetaminophen  Rash    Family History: Family History  Problem Relation Age of Onset   Heart Problems Father     Social History:  reports that he has never smoked. He has never used smokeless tobacco. He reports that he does not drink alcohol  and does not use drugs.  ROS: All other review of systems were reviewed and are negative except what is noted above in HPI  Physical Exam: BP (!) 179/75   Pulse 64   Constitutional:  Alert and oriented, No acute distress. HEENT: [place] AT, moist mucus membranes.  Trachea midline, no masses. Cardiovascular: No clubbing, cyanosis, or edema. Respiratory: Normal respiratory effort, no increased work of  breathing. GI: Abdomen is soft, nontender, nondistended, no abdominal masses GU: No CVA tenderness.  Lymph: No cervical or inguinal lymphadenopathy. Skin: No rashes, bruises or suspicious lesions. Neurologic: Grossly intact, no focal deficits, moving all 4 extremities. Psychiatric: Normal mood and affect.  Laboratory Data: Lab Results  Component Value Date   WBC 8.6 03/23/2023   HGB 12.4 (L) 03/23/2023   HCT 38.5 03/23/2023   MCV 91.0 03/23/2023   PLT 213 03/23/2023    Lab Results  Component Value Date   CREATININE 2.20 (H) 11/13/2023    No results found for: PSA  No results found for: TESTOSTERONE  Lab Results  Component Value Date   HGBA1C 6.1 (H) 07/05/2022    Urinalysis    Component Value Date/Time   COLORURINE YELLOW 07/05/2022 1651   APPEARANCEUR Clear 06/02/2023 1206   LABSPEC 1.011 07/05/2022 1651   PHURINE 5.0 07/05/2022 1651   GLUCOSEU Negative 06/02/2023 1206   HGBUR MODERATE (A) 07/05/2022 1651   HGBUR negative 04/16/2007 0000   BILIRUBINUR Negative 06/02/2023 1206   KETONESUR NEGATIVE 07/05/2022 1651   PROTEINUR  Negative 06/02/2023 1206   PROTEINUR NEGATIVE 07/05/2022 1651   UROBILINOGEN 0.2 12/28/2020 1532   UROBILINOGEN negative 04/16/2007 0000   NITRITE Negative 06/02/2023 1206   NITRITE NEGATIVE 07/05/2022 1651   LEUKOCYTESUR Trace (A) 06/02/2023 1206   LEUKOCYTESUR NEGATIVE 07/05/2022 1651    Lab Results  Component Value Date   LABMICR See below: 06/02/2023   WBCUA 0-5 06/02/2023   LABEPIT 0-10 06/02/2023   MUCUS Present 05/12/2021   BACTERIA None seen 06/02/2023    Pertinent Imaging: Renal US  11/07/2023: Images reviewed and discussed with the patient  No results found for this or any previous visit.  No results found for this or any previous visit.  No results found for this or any previous visit.  No results found for this or any previous visit.  Results for orders placed during the hospital encounter of 11/07/23  US  RENAL  Narrative CLINICAL DATA:  Chronic kidney disease stage 3 B.  EXAM: RENAL / URINARY TRACT ULTRASOUND COMPLETE  COMPARISON:  None Available.  FINDINGS: Right Kidney:  Renal measurements: 8.2 cm x 4.3 cm x 5.2 cm = volume: 96.8 mL. Echogenicity within normal limits. No mass or perinephric fluid. Moderate hydronephrosis which persists after voiding.  Left Kidney:  Renal measurements: 8.1 cm x 3.8 cm x 4.1 cm = volume: 66.2 mL. Echogenicity within normal limits. No mass or perinephric fluid. Moderate hydronephrosis which remained postvoiding.  Bladder:  Appears normal for degree of bladder distention. There is a bladder diverticulum measuring 3.4 cm x 3.4 cm x 4.5 cm. Prevoid volume 292.7 and post void volume 205.9  Other:  None.  IMPRESSION: Moderate bilateral hydronephrosis which remained postvoiding. Large bladder diverticulum. Large postvoid volume.   Electronically Signed By: Cordella Banner On: 11/07/2023 15:54  No results found for this or any previous visit.  Results for orders placed during the hospital encounter of  03/24/21  CT HEMATURIA WORKUP  Narrative CLINICAL DATA:  Microhematuria  EXAM: CT ABDOMEN AND PELVIS WITHOUT AND WITH CONTRAST  TECHNIQUE: Multidetector CT imaging of the abdomen and pelvis was performed following the standard protocol before and following the bolus administration of intravenous contrast.  CONTRAST:  OMNIPAQUE  IOHEXOL  350 MG/ML SOLN  COMPARISON:  None.  FINDINGS: Lower chest: No acute abnormality. Bandlike scarring of the bilateral lung bases.  Hepatobiliary: No focal liver abnormality is seen. Status post cholecystectomy. No biliary dilatation.  Pancreas: Unremarkable. No pancreatic ductal dilatation or surrounding inflammatory changes.  Spleen: Normal in size without significant abnormality.  Adrenals/Urinary Tract: Adrenal glands are unremarkable. Occasional small bilateral renal cysts. Kidneys are otherwise normal, without renal calculi, solid lesion, or hydronephrosis. No evidence of urinary tract filling defect on delayed phase imaging. Thickening of the urinary bladder wall. Diverticulum of the anterior bladder dome (series 7, image 74, series 12, image 69).  Stomach/Bowel: Stomach is within normal limits. Appendix appears normal. No evidence of bowel wall thickening, distention, or inflammatory changes.  Vascular/Lymphatic: Aortic atherosclerosis. No enlarged abdominal or pelvic lymph nodes.  Reproductive: Prostatomegaly with median lobe hypertrophy.  Other: No abdominal wall hernia or abnormality. No abdominopelvic ascites.  Musculoskeletal: No acute or significant osseous findings.  IMPRESSION: 1. No evidence of urinary tract calculus, mass, or hydronephrosis. No evidence of urinary tract filling defect on delayed phase imaging. 2. Thickening of the urinary bladder wall, likely related to chronic outlet obstruction although infectious or inflammatory cystitis is a differential consideration in the setting of hematuria. 3.  Prostatomegaly.  Aortic Atherosclerosis (ICD10-I70.0).   Electronically Signed By: Marolyn JONETTA Jaksch M.D. On: 03/26/2021 10:13  No results found for this or any previous visit.   Assessment & Plan:    1. Benign prostatic hyperplasia, unspecified whether lower urinary tract symptoms present (Primary) Rapaflo  8mg  daily - BLADDER SCAN AMB NON-IMAGING - Urinalysis, Routine w reflex microscopic  2. Nocturia Continue rapalfo 8mg   3. Bilateral hydronephrosis -CT stone study, will call with results   No follow-ups on file.  Belvie Clara, MD  [facility] [place]

## 2023-12-01 NOTE — Therapy (Signed)
 Modified Barium Swallow Study  Patient Details  Name: Jonathan [name] MRN: [id number] Date of Birth: Jun 16, 1945  Today's Date: 12/01/2023  Modified Barium Swallow completed.  Full report located under Chart Review in the Imaging Section.  History of Present Illness Pt with a hx of globus sensation/regurgitation and reported GERD.  He has been referred for a MBS by outpatient SLP to assess baseline swallow function related to dysphagia symptoms.   Clinical Impression Pt exhibits mild pharyngoesophageal dysphagia c/b mild vallecular/pyriform sinus residue d/t decreased UES opening/mild decreased tongue base retraction with trace column of air between tongue base and posterior pharyngeal wall noted.  PAS 2 (material entered airway, but remains above vocal cords and ejected out) with thin liquids in larger volumes and during mixed consistency of thin/pill.  Prolonged mastication efforts noted with solids d/t ill-fitting dentures.  Timely swallow observed and adequate oral clearance.  No aspiration noted throughout study.  Pt has a history of GERD/globus sensation which impacts swallow function overall.  Recommend continue current diet of Regular/thin with pt preferred foods and esophageal precautions followed during all po intake.  Pt was given a hand-out with these precautions.  F/U with Outpatient SLP for dysphagia tx/management.  Thank you for this consult. Factors that may increase risk of adverse event in presence of aspiration Noe & Lianne 2021): Poor general health and/or compromised immunity;Respiratory or GI disease;Reduced cognitive function;Frail or deconditioned;Aspiration of thick, dense, and/or acidic materials;Frequent aspiration of large volumes  Swallow Evaluation Recommendations Recommendations: PO diet PO Diet Recommendation: Regular;Thin liquids (Level 0) Liquid Administration via: Cup Medication Administration: Whole meds with liquid (or with puree if larger) Supervision:  Patient able to self-feed Swallowing strategies  : Slow rate;Small bites/sips;effortful swallow;Multiple dry swallows after each bite/sip;Follow solids with liquids Postural changes: Position pt fully upright for meals;Stay upright 30-60 min after meals Oral care recommendations: Oral care BID (2x/day)      Pat Lenny Fiumara,M.S.,CCC-SLP 12/01/2023,2:18 PM

## 2023-12-01 NOTE — Progress Notes (Signed)
 Bladder Scan completed today.  Patient can void prior to the bladder scan. Bladder scan result: 199  Performed By: [facility] LPN

## 2023-12-03 LAB — URINE CULTURE: Organism ID, Bacteria: NO GROWTH

## 2023-12-12 ENCOUNTER — Ambulatory Visit (HOSPITAL_COMMUNITY)
Admission: RE | Admit: 2023-12-12 | Discharge: 2023-12-12 | Disposition: A | Discharge status: Home / Self Care | Source: Ambulatory Visit | Attending: Urology | Admitting: Urology

## 2023-12-12 DIAGNOSIS — N1339 Other hydronephrosis: Secondary | ICD-10-CM | POA: Diagnosis not present

## 2023-12-12 DIAGNOSIS — K5730 Diverticulosis of large intestine without perforation or abscess without bleeding: Secondary | ICD-10-CM | POA: Diagnosis not present

## 2023-12-12 DIAGNOSIS — N323 Diverticulum of bladder: Secondary | ICD-10-CM | POA: Insufficient documentation

## 2023-12-19 ENCOUNTER — Other Ambulatory Visit: Payer: Self-pay

## 2023-12-19 DIAGNOSIS — N3001 Acute cystitis with hematuria: Secondary | ICD-10-CM

## 2023-12-19 MED ORDER — CEPHALEXIN 500 MG PO CAPS: 500.0000 mg | ORAL_CAPSULE | Freq: Once | ORAL | Status: DC

## 2023-12-19 NOTE — Addendum Note (Signed)
 Addended by: SAMMIE EXIE HERO on: 12/19/2023 09:10 AM   Modules accepted: Orders

## 2023-12-20 ENCOUNTER — Other Ambulatory Visit: Admitting: Urology

## 2023-12-21 ENCOUNTER — Other Ambulatory Visit: Payer: Self-pay | Admitting: Internal Medicine

## 2023-12-21 ENCOUNTER — Telehealth: Payer: Self-pay

## 2023-12-21 NOTE — Telephone Encounter (Signed)
 Patient missed Wednesday's appointment (December 20, 2023).  Wanting to speak with a nurse to ask why he is having a cysto done and to possibly reschedule if needed.  Please advise.  Call: [phone]

## 2023-12-22 NOTE — Telephone Encounter (Signed)
 Will reach out to patient once MD response with reasoning for CYSTO

## 2023-12-25 ENCOUNTER — Emergency Department (HOSPITAL_COMMUNITY)
Admission: EM | Admit: 2023-12-25 | Discharge: 2023-12-25 | Disposition: A | Discharge status: Home / Self Care | Attending: Emergency Medicine | Admitting: Emergency Medicine

## 2023-12-25 ENCOUNTER — Emergency Department (HOSPITAL_COMMUNITY)

## 2023-12-25 ENCOUNTER — Other Ambulatory Visit: Payer: Self-pay

## 2023-12-25 ENCOUNTER — Encounter (HOSPITAL_COMMUNITY): Payer: Self-pay | Admitting: Emergency Medicine

## 2023-12-25 DIAGNOSIS — S161XXA Strain of muscle, fascia and tendon at neck level, initial encounter: Secondary | ICD-10-CM | POA: Insufficient documentation

## 2023-12-25 DIAGNOSIS — Z7982 Long term (current) use of aspirin: Secondary | ICD-10-CM | POA: Diagnosis not present

## 2023-12-25 DIAGNOSIS — Y92410 Unspecified street and highway as the place of occurrence of the external cause: Secondary | ICD-10-CM | POA: Insufficient documentation

## 2023-12-25 DIAGNOSIS — Z7902 Long term (current) use of antithrombotics/antiplatelets: Secondary | ICD-10-CM | POA: Insufficient documentation

## 2023-12-25 DIAGNOSIS — M542 Cervicalgia: Secondary | ICD-10-CM | POA: Diagnosis present

## 2023-12-25 MED ORDER — CYCLOBENZAPRINE HCL 10 MG PO TABS: 10.0000 mg | ORAL_TABLET | Freq: Three times a day (TID) | ORAL | 0 refills | Status: AC | PRN

## 2023-12-25 NOTE — Discharge Instructions (Signed)
Take Tylenol for pain and follow-up with your doctor if any problems °
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
## 2023-12-25 NOTE — Telephone Encounter (Signed)
 Patient calling back to find out when appointment will be rescheduled.   Please advise.

## 2023-12-25 NOTE — Telephone Encounter (Signed)
 Patient called with no answer. Detailed message left with rescheduled cysto date/time.
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
## 2023-12-25 NOTE — ED Notes (Signed)
 Patient transported to X-ray

## 2023-12-26 ENCOUNTER — Ambulatory Visit: Payer: Self-pay | Admitting: Urology

## 2023-12-26 NOTE — Telephone Encounter (Signed)
-----   Message from Belvie Clara sent at 12/26/2023  9:37 AM EDT ----- Ct shows bladder wall thickening. I need to see him for cystoscpy ----- Message ----- From: Interface, Rad Results In Sent: 12/24/2023   8:01 PM EDT To: Belvie LITTIE Clara, MD

## 2024-01-03 ENCOUNTER — Ambulatory Visit: Admitting: Urology

## 2024-01-03 VITALS — BP 186/92 | HR 76

## 2024-01-03 DIAGNOSIS — N35812 Other urethral bulbous stricture, male: Secondary | ICD-10-CM

## 2024-01-03 DIAGNOSIS — R39198 Other difficulties with micturition: Secondary | ICD-10-CM

## 2024-01-03 DIAGNOSIS — N323 Diverticulum of bladder: Secondary | ICD-10-CM

## 2024-01-03 LAB — URINALYSIS, ROUTINE W REFLEX MICROSCOPIC
Bilirubin, UA: NEGATIVE
Glucose, UA: NEGATIVE
Ketones, UA: NEGATIVE
Leukocytes,UA: NEGATIVE
Nitrite, UA: NEGATIVE
Protein,UA: NEGATIVE
Specific Gravity, UA: 1.015 (ref 1.005–1.030)
Urobilinogen, Ur: 0.2 mg/dL (ref 0.2–1.0)
pH, UA: 6 (ref 5.0–7.5)

## 2024-01-03 LAB — MICROSCOPIC EXAMINATION: Bacteria, UA: NONE SEEN

## 2024-01-03 MED ORDER — CEPHALEXIN 500 MG PO CAPS
500.0000 mg | ORAL_CAPSULE | Freq: Once | ORAL | Status: AC
  Administered 2024-01-03: 500 mg via ORAL

## 2024-01-03 NOTE — Progress Notes (Signed)
   01/03/24  CC: difficulty urinating   HPI: Jonathan [name] is a 78yo here for cystoscopy for difficulty urinating Blood pressure (!) 186/92, pulse 76. NED. A&Ox3.   No respiratory distress   Abd soft, NT, ND Normal phallus with bilateral descended testicles  Cystoscopy Procedure Note  Patient identification was confirmed, informed consent was obtained, and patient was prepped using Betadine solution.  Lidocaine  jelly was administered per urethral meatus.     Pre-Procedure: - Inspection reveals a normal caliber ureteral meatus.  Procedure: The flexible cystoscope was introduced without difficulty - bulbar urethral stricture dilated from 8 french to 20 french - Enlarged prostate  - Normal bladder neck - Bilateral ureteral orifices identified - Bladder mucosa  reveals no ulcers, tumors, or lesions - No bladder stones - No trabeculation  After scope was removed a 16 french foley was placed   Post-Procedure: - Patient tolerated the procedure well  Assessment/ Plan: Followup 1 week for a voiding trial  No follow-ups on file.  Belvie Clara, MD

## 2024-01-04 LAB — CYTOLOGY, URINE

## 2024-01-08 ENCOUNTER — Other Ambulatory Visit: Payer: Self-pay | Admitting: Internal Medicine

## 2024-01-08 ENCOUNTER — Other Ambulatory Visit: Payer: Self-pay | Admitting: Cardiovascular Disease

## 2024-01-08 DIAGNOSIS — E785 Hyperlipidemia, unspecified: Secondary | ICD-10-CM

## 2024-01-09 ENCOUNTER — Encounter: Payer: Self-pay | Admitting: Podiatry

## 2024-01-09 ENCOUNTER — Ambulatory Visit (INDEPENDENT_AMBULATORY_CARE_PROVIDER_SITE_OTHER): Admitting: Podiatry

## 2024-01-09 ENCOUNTER — Encounter: Payer: Self-pay | Admitting: Urology

## 2024-01-09 ENCOUNTER — Ambulatory Visit

## 2024-01-09 DIAGNOSIS — M79674 Pain in right toe(s): Secondary | ICD-10-CM | POA: Diagnosis not present

## 2024-01-09 DIAGNOSIS — E1151 Type 2 diabetes mellitus with diabetic peripheral angiopathy without gangrene: Secondary | ICD-10-CM

## 2024-01-09 DIAGNOSIS — N323 Diverticulum of bladder: Secondary | ICD-10-CM | POA: Diagnosis not present

## 2024-01-09 DIAGNOSIS — L84 Corns and callosities: Secondary | ICD-10-CM | POA: Diagnosis not present

## 2024-01-09 DIAGNOSIS — B351 Tinea unguium: Secondary | ICD-10-CM

## 2024-01-09 DIAGNOSIS — M79675 Pain in left toe(s): Secondary | ICD-10-CM

## 2024-01-09 LAB — BLADDER SCAN AMB NON-IMAGING: Scan Result: 150

## 2024-01-09 MED ORDER — CEPHALEXIN 250 MG PO CAPS
500.0000 mg | ORAL_CAPSULE | Freq: Once | ORAL | Status: AC
  Administered 2024-01-09: 500 mg via ORAL

## 2024-01-09 NOTE — Progress Notes (Addendum)
 Fill and Pull Catheter Removal  Patient is present today for a catheter removal due to BPH.  225 ml of sterile water  was instilled into the bladder when the patient felt the urge to urinate. 10 ml of water  was then drained from the balloon.  A 16 FR foley cath was removed from the bladder no complications were noted .  Foley catheter intact and time of removal. Patient as then given some time to void on their own.  Patient can void  100 ml on their own after some time.  Patient tolerated well.  One oral prophylactic antibiotic given per MD orders  Performed by: Jonathan [name], CMA  Follow up/ Additional notes: Return @ 12:30 pm PVR Check  Bladder Scan completed today.  Bladder Scan completed today.  Patient can void prior to the bladder scan. Bladder scan result: 150  Performed By: Jonathan [name], CMA  Additional notes-

## 2024-01-09 NOTE — Patient Instructions (Signed)
 Urethral Stricture  Urethral stricture is when the tube that drains pee (urine) from the bladder out of the body (urethra) becomes too narrow. The urethra can become narrow because of scar tissue, infection, surgery, or an injury. This can make it difficult to pee (urinate). In females, the urethra opens above the vaginal opening. In males, the urethra opens at the tip of the penis, and the urethra is much longer than it is in females. Because of the length of the male urethra, urethral stricture is much more common in males. What are the causes? In males and females, common causes of urethral stricture include: Urinary tract infection (UTI). Sexually transmitted infection (STI). Using a soft tube in the urethra to drain pee from the bladder (urinary catheter). Urinary tract surgery. In males, common causes of urethral stricture include: A severe injury to the pelvis. Prostate surgery. Injury to the penis. In many cases, the cause of urethral stricture is not known. What increases the risk? You are more likely to develop this condition if you: Are male. Males who have had prostate surgery are at risk of developing this condition. Use a urinary catheter. Have had urinary tract surgery. What are the signs or symptoms? The main symptom of this condition is trouble peeing. This may cause decreased pee flow, dribbling, or spraying of pee. Other symptom of this condition may include: Frequent UTIs. Blood in the pee. Pain when peeing. Swelling of the penis in males. Not being able to pee. How is this diagnosed? This condition may be diagnosed based on: Your medical history and a physical exam. Tests of your pee to check for infection or bleeding. X-rays. Ultrasound. Retrograde urethrogram. With this test, a dye is injected into the urethra and then an X-ray is taken. Urethroscopy. This is when a thin tube with a light and camera on the end (urethroscope) is used to look at the urethra. A  CT scan or MRI. How is this treated? This condition is treated with surgery or other procedures. The type of surgery that you have depends on the severity of your condition. You may have: Urethral dilation. In this procedure, the narrow part of the urethra is stretched open (dilated) with dilating instruments or a small balloon. Urethrotomy. In this procedure, a urethroscope is placed into the urethra, and the narrow part of the urethra is cut open with a surgical blade or laser inserted through the urethroscope. Urethroplasty. In this procedure, an incision is made in the urethra and the narrow part is removed. Then, the urethra is reconstructed. Follow these instructions at home: Take over-the-counter and prescription medicines only as told by your health care provider. If you were prescribed antibiotics, take them as told by your provider. Do not stop using the antibiotic even if you start to feel better. Drink enough fluid to keep your pee pale yellow. Keep all follow-up visits. Your provider will check your healing and adjust your treatment plan as needed. Contact a health care provider if: You have frequent peeing or you are only peeing small amounts often. You feel the need to pee urgently. You have pain or burning when you pee. Your pee smells bad or unusual. Your pee is bloody or cloudy. You have pain in your lower abdomen or back. Your genital area is swollen, bruised, or discolored. This includes: The penis, scrotum, and inner thighs for males. The outer genital organs (vulva) and inner thighs for females. You have a fever. You develop swelling in your legs. Get help  right away if: You cannot pee. You have trouble breathing. These symptoms may be an emergency. Get help right away. Call 911. Do not wait to see if the symptoms will go away. Do not drive yourself to the hospital. This information is not intended to replace advice given to you by your health care provider. Make  sure you discuss any questions you have with your health care provider. Document Revised: 02/10/2022 Document Reviewed: 02/10/2022 Elsevier Patient Education  2024 [employer].

## 2024-01-14 NOTE — Progress Notes (Signed)
  Subjective:  Patient ID: Jonathan [name], male    DOB: 11/14/1945,  MRN: [id number]  Jonathan [name] presents to clinic today for at risk foot care. Pt has h/o NIDDM with PAD and preulcerative lesion(s) of both feet and painful mycotic toenails that limit ambulation. Painful toenails interfere with ambulation. Aggravating factors include wearing enclosed shoe gear. Pain is relieved with periodic professional debridement. Painful preulcerative lesion(s) is/are aggravated when weightbearing with and without shoegear. Pain is relieved with periodic professional debridement.   He is followed by Vascular Surgery. Chief Complaint  Patient presents with   Nail Problem    Thick painful toenails, 4 month follow up    New problem(s): None.   Jonathan Suzzane POUR, MD. LOV   Allergies  Allergen Reactions   Ciprofloxacin  Itching   Mosquito (Diagnostic) Itching   Oxycodone-Acetaminophen  Rash    Review of Systems: Negative except as noted in the HPI.  Objective: No changes noted in today's physical examination. There were no vitals filed for this visit. Jonathan [name] is a pleasant 78 y.o. male in NAD. AAO x 3.  Vascular Examination: CFT <4 seconds b/l. DP pulses diminished b/l. PT pulses diminished b/l. Digital hair absent. Skin temperature gradient warm to cool b/l. No ischemia or gangrene. No cyanosis or clubbing noted b/l. Trace edema noted BLE.   Neurological Examination: Sensation grossly intact b/l with 10 gram monofilament. Vibratory sensation intact b/l.   Dermatological Examination: Pedal skin thin, shiny and atrophic b/l. No open wounds. No interdigital macerations.   Toenails 1-5 b/l thick, discolored, elongated with subungual debris and pain on dorsal palpation.   Preulcerative lesion(s) submet head 1 right foot and submet head 5 b/l.  No erythema, no edema, no drainage, no fluctuance.  Musculoskeletal Examination: Muscle strength 5/5 to all lower extremity muscle groups  bilaterally. Hammertoe deformity noted 2-5 b/l. Patient ambulates independent of any assistive aids.  Radiographs: None  Assessment/Plan: 1. Pain due to onychomycosis of toenails of both feet   2. Pre-ulcerative calluses   3. Type II diabetes mellitus with peripheral circulatory disorder [facility])   Consent given for treatment. Patient examined. All patient's and/or POA's questions/concerns addressed on today's visit. Mycotic toenails 1-5 debrided in length and girth without incident. Preulcerative lesion(s) submet head 1 right foot and submet head 5 left foot pared with sharp debridement without incident.Continue daily foot inspections and monitor blood glucose per PCP/Endocrinologist's recommendations. Continue soft, supportive shoe gear daily. Report any pedal injuries to medical professional. Call office if there are any quesitons/concerns. -Patient to continue soft, supportive shoe gear daily. -Offloaded callus(es)/porokeratotic lesion(s) both feet with felt dancers pad applied to insole(s) of shoe. -Patient/POA to call should there be question/concern in the interim.   Return in about 3 months (around 04/09/2024).  Jonathan [name], DPM      [place] LOCATION: [address] [place] [address]                   Office [phone]   [facility] LOCATION: [address] [place], [place] [address] Office [phone]

## 2024-01-26 ENCOUNTER — Telehealth: Payer: Self-pay

## 2024-01-26 ENCOUNTER — Other Ambulatory Visit (HOSPITAL_COMMUNITY)
Admission: RE | Admit: 2024-01-26 | Discharge: 2024-01-26 | Disposition: A | Discharge status: Home / Self Care | Source: Ambulatory Visit | Attending: Nephrology | Admitting: Nephrology

## 2024-01-26 DIAGNOSIS — N1330 Unspecified hydronephrosis: Secondary | ICD-10-CM | POA: Insufficient documentation

## 2024-01-26 LAB — CREATININE, URINE, 24 HOUR
Collection Interval-UCRE24: 24 h
Creatinine, 24H Ur: 1070 mg/d (ref 800–2000)
Creatinine, Urine: 93 mg/dL
Urine Total Volume-UCRE24: 1150 mL

## 2024-01-26 LAB — RENAL FUNCTION PANEL
Albumin: 3.2 g/dL — ABNORMAL LOW (ref 3.5–5.0)
Anion gap: 8 (ref 5–15)
BUN: 26 mg/dL — ABNORMAL HIGH (ref 8–23)
CO2: 23 mmol/L (ref 22–32)
Calcium: 8.4 mg/dL — ABNORMAL LOW (ref 8.9–10.3)
Chloride: 105 mmol/L (ref 98–111)
Creatinine, Ser: 1.87 mg/dL — ABNORMAL HIGH (ref 0.61–1.24)
GFR, Estimated: 37 mL/min — ABNORMAL LOW (ref >60)
Glucose, Bld: 172 mg/dL — ABNORMAL HIGH (ref 70–99)
Phosphorus: 2.4 mg/dL — ABNORMAL LOW (ref 2.5–4.6)
Potassium: 3.4 mmol/L — ABNORMAL LOW (ref 3.5–5.1)
Sodium: 136 mmol/L (ref 135–145)

## 2024-01-26 LAB — PROTEIN, URINE, 24 HOUR
Collection Interval-UPROT: 24 h
Protein, 24H Urine: 196 mg/d — ABNORMAL HIGH (ref 50–100)
Protein, Urine: 17 mg/dL
Urine Total Volume-UPROT: 1150 mL

## 2024-01-26 NOTE — Telephone Encounter (Signed)
 Return call to Dr. Edna and spoke to stacy. Stacy reach out about 24 hour urine. Glade is made aware pt came in on the 09/09 for voiding trial. Stacy voiced it's been resolved.

## 2024-01-30 ENCOUNTER — Ambulatory Visit (HOSPITAL_COMMUNITY): Admitting: Speech Pathology

## 2024-02-06 ENCOUNTER — Ambulatory Visit

## 2024-02-07 ENCOUNTER — Ambulatory Visit: Payer: Self-pay | Admitting: *Deleted

## 2024-02-07 NOTE — Telephone Encounter (Signed)
 Patient scheduled 10/10 with Jonathan [name] , declined UC for today

## 2024-02-07 NOTE — Telephone Encounter (Signed)
 Recommended UC . Patient reports he needs referral and to see PCP or other provider instead of UC. Scheduled appt for 02/09/24 with other provider in practice to review for referral request. Please advise if patient can be seen sooner.     FYI Only or Action Required?: Action required by provider: request for appointment, referral request, and update on patient condition.  Patient was last seen in primary care on 07/21/2022 by Jonathan Suzzane POUR, MD.  Called Nurse Triage reporting Back Pain.  Symptoms began a week ago.  Interventions attempted: Rest, hydration, or home remedies.  Symptoms are: gradually worsening.  Triage Disposition: See HCP Within 4 Hours (Or PCP Triage)  Patient/caregiver understands and will follow disposition?: Unsure                   Copied from CRM #[id number]. Topic: Clinical - Red Word Triage >> Feb 07, 2024  8:02 AM Jonathan [name] wrote: Jonathan [name] that prompted transfer to Nurse Triage: SEVERE PAIN  Patient called in reporting severe pain in his left leg that radiates up to his lower back. He stated the pain is so intense that it prevents him from walking. He rated the pain as 10 out of 10 and said it worsens with movement or walking. Reason for Disposition  [1] SEVERE back pain (e.g., excruciating, unable to do any normal activities) AND [2] not improved 2 hours after pain medicine  Answer Assessment - Initial Assessment Questions No available appt with PCP or [name], Jonathan [name] as requested. Recommended UC and patient requesting appt. Scheduled appt 02/09/24 with other provider in practice. Patient would like to know if he can see [name], Jonathan [name] due to seeing her in the past.      1. ONSET: When did the pain begin? (e.g., minutes, hours, days)     1 week  2. LOCATION: Where does it hurt? (upper, mid or lower back)     Low back  3. SEVERITY: How bad is the pain?  (e.g., Scale 1-10; mild, moderate, or severe)     10/10 4. PATTERN: Is the pain  constant? (e.g., yes, no; constant, intermittent)      Sitting intermittent, but standing or walking constant  5. RADIATION: Does the pain shoot into your legs or somewhere else?     Shoots down left leg 6. CAUSE:  What do you think is causing the back pain?      Not sure  7. BACK OVERUSE:  Any recent lifting of heavy objects, strenuous work or exercise?     Na  8. MEDICINES: What have you taken so far for the pain? (e.g., nothing, acetaminophen , NSAIDS)     Tylenol  not effective  9. NEUROLOGIC SYMPTOMS: Do you have any weakness, numbness, or problems with bowel/bladder control?     Bilateral feet numbness due to arthritis  10. OTHER SYMPTOMS: Do you have any other symptoms? (e.g., fever, abdomen pain, burning with urination, blood in urine)       Low back pain, severe pain walking feels like will fall or left leg give out  11. PREGNANCY: Is there any chance you are pregnant? When was your last menstrual period?       na  Protocols used: Back Pain-A-AH

## 2024-02-09 ENCOUNTER — Ambulatory Visit: Payer: Self-pay

## 2024-02-13 ENCOUNTER — Encounter: Payer: Self-pay | Admitting: Internal Medicine

## 2024-02-26 NOTE — Telephone Encounter (Signed)
 Pt no showed appt today. Mailed no show letter and policy print out to patient. Informed referring provider.

## 2024-02-26 NOTE — Telephone Encounter (Signed)
 Called to reschedule no show appt. Vmail full. Sent doximity message.

## 2024-02-27 ENCOUNTER — Encounter (HOSPITAL_COMMUNITY): Payer: Self-pay | Admitting: Speech Pathology

## 2024-02-27 ENCOUNTER — Ambulatory Visit (HOSPITAL_COMMUNITY): Attending: Family Medicine | Admitting: Speech Pathology

## 2024-02-27 ENCOUNTER — Other Ambulatory Visit: Payer: Self-pay

## 2024-02-27 ENCOUNTER — Telehealth (HOSPITAL_COMMUNITY): Payer: Self-pay | Admitting: Speech Pathology

## 2024-02-27 DIAGNOSIS — R1312 Dysphagia, oropharyngeal phase: Secondary | ICD-10-CM | POA: Insufficient documentation

## 2024-02-27 NOTE — Telephone Encounter (Signed)
 Telephone Call:  Pt did not show for his SLP evaluation. His voice mailbox was full and unable to leave a message. If Pt is doing well with this swallowing, he does not have to reschedule.  Thank you,  Lamar Candy, CCC-SLP [phone]

## 2024-02-27 NOTE — Telephone Encounter (Addendum)
 Called to reschedule no show appt. Vmail full. Sent doximity message. Mailed unable to contact letter.

## 2024-02-27 NOTE — Therapy (Signed)
 OUTPATIENT SPEECH LANGUAGE PATHOLOGY SWALLOW TREATMENT   Patient Name: Jonathan Jonathan MRN: [id number] DOB:06-03-1945, 78 y.o., male Today's Date: 02/27/2024  PCP: Jonathan Suzzane POUR, MD REFERRING PROVIDER: Maree Leni Edyth DELENA, MD  END OF SESSION:  End of Session - 02/27/24 1653     Visit Number 2    Number of Visits 2    Authorization Type U[employer] Medicare Dual complete    SLP Start Time 1533    SLP Stop Time  1630    SLP Time Calculation (min) 57 min    Activity Tolerance Patient tolerated treatment well          Past Medical History:  Diagnosis Date   Arthritis    ra, sees dr lynwood bent   Chronic kidney disease    Coronary artery disease    saw dr orren in [place] 20 yrs ago, released from cardiology   Diabetes mellitus without complication (HCC)    type 2   Dyspnea 07/2022   Lately r/t CAD   GERD (gastroesophageal reflux disease)    Hypercholesteremia    Hypertension    Peripheral vascular disease    Stroke (HCC) 2007   Light Stroke per pt. No deficits   Urethral stricture    Past Surgical History:  Procedure Laterality Date   ABDOMINAL AORTOGRAM W/LOWER EXTREMITY N/A 04/20/2022   Procedure: ABDOMINAL AORTOGRAM W/LOWER EXTREMITY;  Surgeon: Jonathan Deatrice DELENA, MD;  Location: [facility];  Service: Cardiovascular;  Laterality: N/A;   BACK SURGERY     x 3 lower back   CARDIAC CATHETERIZATION  2000   small amt plaque relased from cardiology 20 yrs ago   CHOLECYSTECTOMY     COLONOSCOPY WITH PROPOFOL  N/A 10/26/2021   Procedure: COLONOSCOPY WITH PROPOFOL ;  Surgeon: Jonathan Carlin POUR, DO;  Location: [facility];  Service: Endoscopy;  Laterality: N/A;  To arrive at 1200   CORONARY ARTERY BYPASS GRAFT N/A 07/07/2022   Procedure: CORONARY ARTERY BYPASS GRAFTING (CABG) X5 USING OPEN LEFT INTERNAL MAMMARY ARTERY AND ENDOSCOPICALLY HARVESTED RIGHT GREATER SAPHENOUS VEIN;  Surgeon: Jonathan Dorise POUR, MD;  Location: [facility];  Service: Open Heart Surgery;  Laterality:  N/A;   CYSTOSCOPY WITH URETHRAL DILATATION N/A 04/17/2019   Procedure: CYSTOSCOPY WITH URETHRAL DILATATION, CYSTOGRAM;  Surgeon: Jonathan Lonni Righter, MD;  Location: [facility];  Service: Urology;  Laterality: N/A;   EYE SURGERY Bilateral    cataracts   IR RADIOLOGIST EVAL & MGMT  07/12/2021   IR RADIOLOGIST EVAL & MGMT  12/14/2022   PERIPHERAL VASCULAR BALLOON ANGIOPLASTY  04/20/2022   Procedure: PERIPHERAL VASCULAR BALLOON ANGIOPLASTY;  Surgeon: Jonathan Deatrice DELENA, MD;  Location: [facility];  Service: Cardiovascular;;  aborted; unable to cross   POLYPECTOMY  10/26/2021   Procedure: POLYPECTOMY INTESTINAL;  Surgeon: Jonathan Carlin POUR, DO;  Location: [facility];  Service: Endoscopy;;   RIGHT/LEFT HEART CATH AND CORONARY ANGIOGRAPHY Bilateral 06/24/2022   Procedure: RIGHT/LEFT HEART CATH AND CORONARY ANGIOGRAPHY;  Surgeon: Jonathan Deatrice DELENA, MD;  Location: [facility];  Service: Cardiovascular;  Laterality: Bilateral;   TEE WITHOUT CARDIOVERSION N/A 07/07/2022   Procedure: TRANSESOPHAGEAL ECHOCARDIOGRAM;  Surgeon: Jonathan Dorise POUR, MD;  Location: [facility] OR;  Service: Open Heart Surgery;  Laterality: N/A;   urethral stricture surgery  15 yrs ago   Patient Active Problem List   Diagnosis Date Noted   Postoperative anemia 07/21/2022   Hospital discharge follow-up 07/21/2022   S/P CABG x 5 07/07/2022  Falls, initial encounter 06/28/2022   Chronic systolic heart failure (HCC) 06/24/2022   Coronary artery disease 06/24/2022   Alternating constipation and diarrhea 03/16/2022   Hip arthritis 03/09/2022   Encounter for examination following treatment at hospital 03/09/2022   Iron deficiency anemia 01/13/2022   Other fatigue 01/13/2022   PAD (peripheral artery disease) 11/26/2021   Encounter for general adult medical examination with abnormal findings 11/26/2021   Eczema 11/26/2021   Onychomycosis 11/26/2021   Diabetic polyneuropathy associated with type 2  diabetes mellitus (HCC) 10/04/2021   Acute pain of right shoulder 09/17/2021   Subclinical hypothyroidism 02/24/2021   Current chronic use of systemic steroids 02/24/2021   Microhematuria 02/03/2021   Internal hemorrhoids 12/22/2020   UTI (urinary tract infection) 10/26/2020   Type II diabetes mellitus with nephropathy (HCC)    Chronic kidney disease, stage III (moderate) (HCC) 07/16/2007   MONOCYTOSIS SYMPTOMATIC 04/23/2007   BPH (benign prostatic hyperplasia) 04/16/2007   Constipation 10/16/2006   Hyperlipidemia LDL goal <100 04/06/2006   Essential hypertension 04/06/2006   Cardiac dysrhythmia 04/06/2006   Allergic rhinitis 04/06/2006   GERD (gastroesophageal reflux disease) 04/06/2006   Diaphragmatic hernia 04/06/2006   Rheumatoid arthritis (HCC) 04/06/2006   LOW BACK PAIN 04/06/2006    ONSET DATE: ~July 2024   REFERRING DIAG: R13.10 (ICD-10-CM) - Dysphagia, unspecified  THERAPY DIAG:  Dysphagia, oropharyngeal phase  Rationale for Evaluation and Treatment: Rehabilitation  SUBJECTIVE:   SUBJECTIVE STATEMENT: It still feels like some foods won't go down.  Pt accompanied by: self and significant other  PERTINENT HISTORY: Jonathan Jonathan is a 78 yo male who was referred by Jonathan Jonathan for a clinical swallow evaluation due to Pt reports of difficulty swallowing solid foods. He had an UGI in 12/2022 (radiologist recommended MBSS with SLP) and another UGI in 07/2023 (see results below). He reports a history of stroke in ~2007 without residual deficits (had slurred speech, twisted face). Pt tells SLP that he wears U/L dentures that fit well, but are dull. He indicates that he has trouble swallowing bread and meat (bacon) and does well with soft foods and pills. Following the clinical swallow evaluation in June 2025, he had MBSS (see results below).  PAIN:  Are you having pain? No  FALLS: Has patient fallen in last 6 months?  No  LIVING ENVIRONMENT: Lives with: lives with  their spouse Lives in: House/apartment  PLOF:  Level of assistance: Independent with ADLs, Independent with IADLs Employment: Retired  PATIENT GOALS: Improve swallow  OBJECTIVE:  Note: Objective measures were completed at Evaluation unless otherwise noted. OBJECTIVE:   DIAGNOSTIC FINDINGS:   UGI 12/16/2022: IMPRESSION: 1. No significant abnormality of the esophagus, stomach, or doudenum. No hiatal hernia. No significant reflux. 2. Significant vallecular and pyriform sinus residue noted during cervical esophagram. Patient would benefit from speech pathologist evaluation. No aspiration or penetration.  UGI 07/06/2023: IMPRESSION: 1. Prominent vallecular, piriform sinus, and hypopharynx residual again noted with evidence of penetration. No aspiration. Recommend speech pathology evaluation with modified barium swallow study. 2. Moderate gastroesophageal reflux. 3. Tiny sliding hiatal hernia.  COGNITION: Overall cognitive status: Within functional limits for tasks assessed  SUBJECTIVE DYSPHAGIA REPORTS:  Date of onset: 8/20024 Reported symptoms: coughing with solids, globus sensation, and difficulty chewing foods  Current diet: regular and thin liquids  Co-morbid voice changes: No  FACTORS WHICH MAY INCREASE RISK OF ADVERSE EVENT IN PRESENCE OF ASPIRATION:  General health: well appearing  Risk factors: none evident     ORAL MOTOR EXAMINATION: Overall  status: WFL Comments: N/A  CLINICAL SWALLOW ASSESSMENT:   Dentition: dentures (upper), dentures (lower) , and Pt states they are dull Vocal quality at baseline: normal Patient directly observed with POs: Yes: regular, dysphagia 3 (soft), and thin liquids  Feeding: able to feed self Liquids provided by: cup [facility] Swallow Protocol: Pass Oral phase signs and symptoms: N/A Pharyngeal phase signs and symptoms: multiple swallows and delayed throat clear  Pharyngeal Impairment Domain: Pharyngeal Impairment Domain Soft palate  elevation: No bolus between soft palate (SP)/pharyngeal wall (PW) Laryngeal elevation: Complete superior movement of thyroid  cartilage with complete approximation of arytenoids to epiglottic petiole Anterior hyoid excursion: Complete anterior movement Epiglottic movement: Complete inversion Laryngeal vestibule closure: Complete, no air/contrast in laryngeal vestibule Pharyngeal stripping wave : Present - complete Pharyngeal contraction (A/P view only): N/A Pharyngoesophageal segment opening: Partial distention/partial duration, partial obstruction of flow (resulting in pyriform sinus/vallecular residue, but only noted intermittently (first swallow most evident)) Tongue base retraction: Trace column of contrast or air between tongue base and PPW Pharyngeal residue: Collection of residue within or on pharyngeal structures Location of pharyngeal residue: Valleculae; Pyriform sinuses  Swallowing strategies  : Slow rate; Small bites/sips; effortful swallow; Multiple dry swallows after each bite/sip; Follow solids with liquids Postural changes: Position pt fully upright for meals; Stay upright 30-60 min after meals Oral care recommendations: Oral care BID (2x/day)  Clinical Impression: Pt exhibits mild pharyngoesophageal dysphagia c/b mild vallecular/pyriform sinus residue d/t decreased UES opening/mild decreased tongue base retraction with trace column of air between tongue base and posterior pharyngeal wall noted. PAS 2 (material entered airway, but remains above vocal cords and ejected out) with thin liquids in larger volumes and during mixed consistency of thin/pill. Prolonged mastication efforts noted with solids d/t ill-fitting dentures. Timely swallow observed and adequate oral clearance. No aspiration noted throughout study. Pt has a history of GERD/globus sensation which impacts swallow function overall. Recommend continue current diet of Regular/thin with pt preferred foods and esophageal  precautions followed during all po intake. Pt was given a hand-out with these precautions. F/U with Outpatient SLP for dysphagia tx/management. Thank you for this consult.                                                                                                                      TREATMENT DATE: 02/27/24 Pt seen for dysphagia treatment and review/interpretation of MBSS completed in September. Pt reports that his swallowing is not any better and has to swallow several times to get solid foods to clear from his throat. He reports greater difficulty with dry solids, meats, and bread. SLP reviewed imaging from MBSS and shared imaging with Pt and spouse. Pt's globus reports are consistent with imaging with significant vallecular and pyriform residue with solid (cracker), pill (expectorated), and liquids. Puree traversed the pharynx without incident. Pt with reduced lingual retraction, epiglottic deflection, and decreased UES opening/timing. SLP provided Pt with water , puree, mech soft, and peanut butter crackers in our session today and Pt implemented head  turn to his LEFT in attempt to reduce reports of globus and he verbalized that this did seem to help (did not help with head turn to the RIGHT). Pt also cued to take a small sip of water  with his crackers to add moisture and he subjectively reported that this helped. SLP showed Pt how to complete CTAR (chin tuck against resistance) and lingual strengthening with use of his fist to chin and then thumb beneath chin while pushing tongue to the roof of his mouth. Recommend that Pt continue with self regulated regular textures and thin liquids, adding moisture to dry solids, cutting meats and breads very well, masticate thoroughly, implement effortful swallow and head turn to the LEFT prn, and completed swallowing exercises 3x daily going forward. He will contact SLP if he has further questions/concerns and knows to contact his doctor his symptoms increase. Pt to  be discharged from SLP services at this time.   PATIENT EDUCATION: Education details: Pt to complete lingual strengthening exercises and CTAR Person educated: Patient and Spouse Education method: Explanation, Demonstration, and Handouts Education comprehension: verbalized understanding   ASSESSMENT:  CLINICAL IMPRESSION: (from in clinic eval JUne 2025) Patient is a 78 y.o. male who was seen today for a clinical swallow evaluation at the request of his PCP, Jonathan Jonathan. Pt presents with mild oral phase dysphagia in setting of U/L dull dentures and suspected pharyngeal phase dysphagia (Pt with multiple swallows, occasional delayed throat clearing, reports of globus, and evidence of vallecular and pyriform sinus residue on his UGI studies). Recommend regular textures (chop meats and add moisture to dry solids) and thin liquids with Pt to swallow 2x for each bite/sip and adhere to reflux precautions. Plan for MBSS.   OBJECTIVE IMPAIRMENTS: include dysphagia. These impairments are limiting patient from safety when swallowing. Factors affecting potential to achieve goals and functional outcome are none. Patient will benefit from skilled SLP services to address above impairments and improve overall function.  REHAB POTENTIAL: Excellent   GOALS: Goals reviewed with patient? Yes  SHORT TERM GOALS: Target date: ~3 weeks  MBSS to objectively evaluate swallow, identify appropriate strategies as needed, and determine plan of care with Pt Baseline: Goal status: MET  PLAN:  SLP FREQUENCY: Pt to complete pharyngeal strengthening exercises at home and will contact SLP if he would like to return for therapy.  PLANNED INTERVENTIONS: Aspiration precaution training, Pharyngeal strengthening exercises, SLP instruction and feedback, Compensatory strategies, Patient/family education, [phone] Treatment of swallowing function, and MBSS   Thank you,  Lamar Candy, CCC-SLP [phone]  Eulas Schweitzer,  CCC-SLP 02/27/2024, 5:00 PM

## 2024-03-12 DIAGNOSIS — M2042 Other hammer toe(s) (acquired), left foot: Secondary | ICD-10-CM | POA: Diagnosis not present

## 2024-03-12 DIAGNOSIS — R748 Abnormal levels of other serum enzymes: Secondary | ICD-10-CM | POA: Diagnosis not present

## 2024-03-12 DIAGNOSIS — R7989 Other specified abnormal findings of blood chemistry: Secondary | ICD-10-CM | POA: Diagnosis not present

## 2024-03-12 DIAGNOSIS — R21 Rash and other nonspecific skin eruption: Secondary | ICD-10-CM | POA: Diagnosis not present

## 2024-03-12 DIAGNOSIS — E669 Obesity, unspecified: Secondary | ICD-10-CM | POA: Diagnosis not present

## 2024-03-12 DIAGNOSIS — Z6833 Body mass index (BMI) 33.0-33.9, adult: Secondary | ICD-10-CM | POA: Diagnosis not present

## 2024-03-12 DIAGNOSIS — M0579 Rheumatoid arthritis with rheumatoid factor of multiple sites without organ or systems involvement: Secondary | ICD-10-CM | POA: Diagnosis not present

## 2024-03-12 DIAGNOSIS — M5136 Other intervertebral disc degeneration, lumbar region with discogenic back pain only: Secondary | ICD-10-CM | POA: Diagnosis not present

## 2024-03-12 DIAGNOSIS — M2041 Other hammer toe(s) (acquired), right foot: Secondary | ICD-10-CM | POA: Diagnosis not present

## 2024-04-06 NOTE — Progress Notes (Unsigned)
 Cardiology Clinic Note   Date: 04/06/2024 ID: Jonathan [name], DOB [birth year], MRN [id number]  Primary Cardiologist:  Deatrice Cage, MD  Chief Complaint   Jonathan [name] is a 78 y.o. male who presents to the clinic today for ***  Patient Profile   Jonathan [name] is followed by *** for the history outlined below.       Past medical history significant for: CAD. R/LHC 06/24/2022: Ostial proximal RCA 30%.  RPDA 70%.  Third RPL 100%.  Ostial LAD 90%.  Mid LAD #1 50%, #2 80%.  D2 85%.  D1 80%.  Mid to distal LM 20%.  Ostial proximal LCx 80%.  Mid to distal LCx 50%.  Severe diffuse three-vessel coronary artery disease with involvement of ostial LAD and ostial LCx.  Recommend CT surgery consult for CABG. CABG x 5 07/07/2022: LIMA to LAD.  SVG to diagonal.  Sequential SVG to OM1 and OM 2.  SVG to PDA. Chronic HFmrEF. Echo 06/10/2022: EF 50 to 55%.  No RWMA.  Mild LVH of the basal-septal segment.  Grade I DD.  Normal RV size/function.  Mild MR.  Aortic valve sclerosis without stenosis. R/LHC 06/24/2022: Normal RA pressure, moderate pulmonary hypertension, normal cardiac output. PAD. Abdominal aortogram with lower extremity 04/20/2022: Left lower extremity with mild to moderate distal SFA stenosis with occluded anterior tibial artery proximally, occluded peroneal artery and occluded posterior tibial arteries.  Reconstitution of the anterior tibial artery and posterior tibial arteries via collaterals in the distal portion just above the calf.  Attempted unsuccessful angioplasty of the left anterior tibial artery via the antegrade and retrograde approach due to inability to cross the long calcified occlusion.  Recommend evaluation for left femoropopliteal to posterior tibial artery bypass. ABI 06/15/2023: Moderate right lower extremity arterial disease.  Severe left lower extremity arterial disease.  ABI 0.5 on the right and 0.47 on the left. Hypertension. Hyperlipidemia. Lipid panel 10/13/2023: LDL  63, HDL 41, TG 99, total 123. GERD. Subclinical hypothyroidism. T2DM. RA. CKD stage III.  In summary, patient had an angiogram in December 2023 which showed all tibial peroneal vessels occluded with reconstitution of the anterior tibial artery posterior tibial artery via collaterals in the distal portion just above the ankle as detailed above.  Attempted angioplasty of the anterior tibial artery was unsuccessful secondary to inability to cross long calcified occlusion.  Dr. Lanis was consulted for bypass but patient was found to have borderline quality vein conduits.  In February 2024 patient complained of exertional dyspnea and episodes of chest pain.  Echo showed EF 50 to 55% with mild MR.  Lexiscan  was indeterminate risk and showed medium size defect in the inferior lateral location consistent with prior infarct with mild peri-infarct ischemia.  LHC February 2023 showed severe three-vessel CAD.  RHC showed moderate pulmonary hypertension.  Patient underwent CABG x 5 on 07/07/2022.  Patient was seen in clinic on 12/29/2022 for routine follow-up.  He was doing well at that time with no anginal symptoms.  He reported resolution of lower extremity rest pain.  He did complain of claudication that was not lifestyle limiting.  Metoprolol  tartrate was switched to metoprolol  succinate.   Patient was last seen in the office by me on 10/13/2023 for routine follow-up.  He was doing well from a cardiac perspective with occasional nerve pain on either side of sternal incision.  He denied claudication.  He was dealing with seasonal allergies and inquired about safe medications for treatment.     History  of Present Illness    Today, patient ***  CAD S/p CABG x 05 July 2022.  Patient*** - Continue aspirin , Plavix , Toprol , rosuvastatin .   Chronic HFmrEF Echo February 2024 showed EF 50 to 55%, Grade I DD, normal RV size/function, mild MR.  Patient*** - Continue losartan , Toprol , hydrochlorothiazide .     PAD ABI February 2025 showed moderate right lower extremity arterial disease and severe left lower extremity arterial disease.  Patient*** - Continue aspirin , Plavix , rosuvastatin .   Hypertension BP today***. No dizziness or headaches.  - Continue Toprol , losartan , hydrochlorothiazide .    Hyperlipidemia LDL 63 June 2025, at goal. - Continue rosuvastatin .  ROS: All other systems reviewed and are otherwise negative except as noted in History of Present Illness.  EKGs/Labs Reviewed        10/13/2023: ALT 25; AST 44 01/26/2024: BUN 26; Creatinine, Ser 1.87; Potassium 3.4; Sodium 136   No results found for requested labs within last 365 days.   No results found for requested labs within last 365 days.   No results found for requested labs within last 365 days.  ***  Risk Assessment/Calculations    {Does this patient have ATRIAL FIBRILLATION?:[phone]} No BP recorded.  {Refresh Note OR Click here to enter BP  :1}***        Physical Exam    VS:  There were no vitals taken for this visit. , BMI There is no height or weight on file to calculate BMI.  GEN: Well nourished, well developed, in no acute distress. Neck: No JVD or carotid bruits. Cardiac: *** RRR. *** No murmur. No rubs or gallops.   Respiratory:  Respirations regular and unlabored. Clear to auscultation without rales, wheezing or rhonchi. GI: Soft, nontender, nondistended. Extremities: Radials/DP/PT 2+ and equal bilaterally. No clubbing or cyanosis. No edema ***  Skin: Warm and dry, no rash. Neuro: Strength intact.  Assessment & Plan   ***  Disposition: ***     {Are you ordering a CV Procedure (e.g. stress test, cath, DCCV, TEE, etc)?   Press F2        :[id number]}   Signed, Barnie HERO. Mia Milan, DNP, NP-C

## 2024-04-08 DIAGNOSIS — R809 Proteinuria, unspecified: Secondary | ICD-10-CM | POA: Diagnosis not present

## 2024-04-08 DIAGNOSIS — N1832 Chronic kidney disease, stage 3b: Secondary | ICD-10-CM | POA: Diagnosis not present

## 2024-04-08 DIAGNOSIS — I5042 Chronic combined systolic (congestive) and diastolic (congestive) heart failure: Secondary | ICD-10-CM | POA: Diagnosis not present

## 2024-04-08 DIAGNOSIS — E1122 Type 2 diabetes mellitus with diabetic chronic kidney disease: Secondary | ICD-10-CM | POA: Diagnosis not present

## 2024-04-10 ENCOUNTER — Encounter: Payer: Self-pay | Admitting: Student

## 2024-04-10 ENCOUNTER — Ambulatory Visit: Attending: Student | Admitting: Student

## 2024-04-10 VITALS — BP 136/80 | HR 70 | Resp 19 | Ht 69.0 in | Wt 219.0 lb

## 2024-04-10 DIAGNOSIS — E785 Hyperlipidemia, unspecified: Secondary | ICD-10-CM

## 2024-04-10 DIAGNOSIS — I739 Peripheral vascular disease, unspecified: Secondary | ICD-10-CM

## 2024-04-10 DIAGNOSIS — I25810 Atherosclerosis of coronary artery bypass graft(s) without angina pectoris: Secondary | ICD-10-CM

## 2024-04-10 DIAGNOSIS — I10 Essential (primary) hypertension: Secondary | ICD-10-CM | POA: Diagnosis not present

## 2024-04-10 DIAGNOSIS — I5020 Unspecified systolic (congestive) heart failure: Secondary | ICD-10-CM | POA: Diagnosis not present

## 2024-04-10 MED ORDER — COMFORT TOUCH BP CUFF/MEDIUM MISC: 1.0000 [IU] | Freq: Once | 0 refills | Status: AC

## 2024-04-10 NOTE — Patient Instructions (Signed)
 Medication Instructions:   Your physician recommends that you continue on your current medications as directed. Please refer to the Current Medication list given to you today.    Pick up Blood Pressure Cuff  Avoid alcohol , caffeine, and exercise within 30 minutes before checking blood pressure. Sit still, with feet flat on the floor, in a straight backed, supportive chair.  Rest for 5 minutes.  Support arm on flat surface at heart level. Bottom of cuff should be placed directly above the bend of the elbow. Take multiple readings.  Write down and bring to all follow up appointments.  Bring cuff to clinic periodically for accuracy comparison.    *If you need a refill on your cardiac medications before your next appointment, please call your pharmacy*  Lab Work:  None ordered at this time   If you have labs (blood work) drawn today and your tests are completely normal, you will receive your results only by:  MyChart Message (if you have MyChart) OR  A paper copy in the mail If you have any lab test that is abnormal or we need to change your treatment, we will call you to review the results.  Testing/Procedures:  None ordered at this time   Referrals:  None ordered at this time   Follow-Up:  At [facility], you and your health needs are our priority.  As part of our continuing mission to provide you with exceptional heart care, our providers are all part of one team.  This team includes your primary Cardiologist (physician) and Advanced Practice Providers or APPs (Physician Assistants and Nurse Practitioners) who all work together to provide you with the care you need, when you need it.  Your next appointment:   5 - 6 month(s)  Provider:    Deatrice Cage, MD or Barnie Hila, NP    We recommend signing up for the patient portal called MyChart.  Sign up information is provided on this After Visit Summary.  MyChart is used to connect with patients for Virtual  Visits (Telemedicine).  Patients are able to view lab/test results, encounter notes, upcoming appointments, etc.  Non-urgent messages can be sent to your provider as well.   To learn more about what you can do with MyChart, go to [email or web address].

## 2024-04-16 ENCOUNTER — Ambulatory Visit: Admitting: Podiatry

## 2024-04-16 ENCOUNTER — Encounter: Payer: Self-pay | Admitting: Podiatry

## 2024-04-16 DIAGNOSIS — B351 Tinea unguium: Secondary | ICD-10-CM | POA: Diagnosis not present

## 2024-04-16 DIAGNOSIS — M79675 Pain in left toe(s): Secondary | ICD-10-CM | POA: Diagnosis not present

## 2024-04-16 DIAGNOSIS — M79674 Pain in right toe(s): Secondary | ICD-10-CM

## 2024-04-16 DIAGNOSIS — E1151 Type 2 diabetes mellitus with diabetic peripheral angiopathy without gangrene: Secondary | ICD-10-CM

## 2024-04-16 DIAGNOSIS — L84 Corns and callosities: Secondary | ICD-10-CM | POA: Diagnosis not present

## 2024-04-18 ENCOUNTER — Ambulatory Visit: Payer: Self-pay | Admitting: Internal Medicine

## 2024-04-18 ENCOUNTER — Ambulatory Visit: Payer: Self-pay

## 2024-04-18 NOTE — Telephone Encounter (Signed)
 Noted

## 2024-04-18 NOTE — Telephone Encounter (Signed)
 FYI Only or Action Required?: FYI only for provider: appointment scheduled on 12/19.  Patient was last seen in primary care on 07/21/2022 by Tobie Suzzane POUR, MD.  Called Nurse Triage reporting Fall.  Symptoms began several weeks ago.  Interventions attempted: OTC medications: tylenol .  Symptoms are: unchanged.  Triage Disposition: See PCP When Office is Open (Within 3 Days)  Patient/caregiver understands and will follow disposition?: Yes  Copied from CRM #[id number]. Topic: Clinical - Red Word Triage >> Apr 18, 2024  3:08 PM Rosaria BRAVO wrote: Red Word that prompted transfer to Nurse Triage: Pt had a fall and injured his left arm/shoulder. Reason for Disposition  MILD weakness (e.g., does not interfere with ability to work, go to school, normal activities)  (Exception: Mild weakness is a chronic symptom.)  Answer Assessment - Initial Assessment Questions 3 weeks ago had a fall. Balance is not good. Dog jumped on him and knocked him down. Landed on back/left arm/ left shoulder. Fell outside on the grass.  Open heart surgery last year- balance issues since then  Pain achy-throbbing- taking tylenol - helps then comes right back.  Heat helps some.  Denies CP, SOB, Dizzy, Fever. Denies unilateral weakness, numbness tingling, or coordination issues. Pain worsens with use and laying down at night. Can't lift up over his head due to pain.  Appt with PCP office 12/19 to assess. Strict ED precautions advised and understood.   1. MECHANISM: How did the fall happen?     Dog knocked him over  2. DOMESTIC VIOLENCE AND ELDER ABUSE SCREENING: Did you fall because someone pushed you or tried to hurt you? If Yes, ask: Are you safe now?     denies 3. ONSET: When did the fall happen? (e.g., minutes, hours, or days ago)     3 weeks ago  4. LOCATION: What part of the body hit the ground? (e.g., back, buttocks, head, hips, knees, hands, head, stomach)     Left back, shoulder 5. INJURY: Did you  hurt (injure) yourself when you fell? If Yes, ask: What did you injure? Tell me more about this? (e.g., body area; type of injury; pain severity)     Left shoulder/arm  6. PAIN: Is there any pain? If Yes, ask: How bad is the pain? (e.g., Scale 0-10; or none, mild,      8/10- aching, throbbing- comes and goes - worse with use and at night and cannot lift above his head  7. SIZE: For cuts, bruises, or swelling, ask: How large is it? (e.g., inches or centimeters)      Denies  9. OTHER -SYMPTOMS: Do you have any other symptoms? (e.g., dizziness, fever, weakness; new-onset or worsening).      Denies  10. CAUSE: What do you think caused the fall (or falling)? (e.g., dizzy spell, tripped)       Dog knocked him over  Protocols used: Falls and [facility]

## 2024-04-19 ENCOUNTER — Ambulatory Visit (INDEPENDENT_AMBULATORY_CARE_PROVIDER_SITE_OTHER): Payer: Self-pay

## 2024-04-19 ENCOUNTER — Ambulatory Visit (HOSPITAL_COMMUNITY)
Admission: RE | Admit: 2024-04-19 | Discharge: 2024-04-19 | Disposition: A | Discharge status: Home / Self Care | Source: Ambulatory Visit

## 2024-04-19 VITALS — BP 182/89 | HR 88 | Ht 69.0 in | Wt 216.0 lb

## 2024-04-19 DIAGNOSIS — R030 Elevated blood-pressure reading, without diagnosis of hypertension: Secondary | ICD-10-CM | POA: Diagnosis not present

## 2024-04-19 DIAGNOSIS — M25511 Pain in right shoulder: Secondary | ICD-10-CM

## 2024-04-19 NOTE — Progress Notes (Unsigned)
" ° °  Established Patient Office Visit  Subjective   Patient ID: Jonathan [name], male    DOB: 01-19-1946  Age: 78 y.o. MRN: [id number]  Chief Complaint  Patient presents with   Medical Management of Chronic Issues    Shoulder injury    HPI  Patient Active Problem List   Diagnosis Date Noted   Postoperative anemia 07/21/2022   Hospital discharge follow-up 07/21/2022   S/P CABG x 5 07/07/2022   Falls, initial encounter 06/28/2022   Chronic systolic heart failure (HCC) 06/24/2022   Coronary artery disease 06/24/2022   Alternating constipation and diarrhea 03/16/2022   Hip arthritis 03/09/2022   Encounter for examination following treatment at hospital 03/09/2022   Iron deficiency anemia 01/13/2022   Other fatigue 01/13/2022   PAD (peripheral artery disease) 11/26/2021   Encounter for general adult medical examination with abnormal findings 11/26/2021   Eczema 11/26/2021   Onychomycosis 11/26/2021   Diabetic polyneuropathy associated with type 2 diabetes mellitus (HCC) 10/04/2021   Acute pain of right shoulder 09/17/2021   Subclinical hypothyroidism 02/24/2021   Current chronic use of systemic steroids 02/24/2021   Microhematuria 02/03/2021   Internal hemorrhoids 12/22/2020   UTI (urinary tract infection) 10/26/2020   Type II diabetes mellitus with nephropathy (HCC)    Chronic kidney disease, stage III (moderate) (HCC) 07/16/2007   MONOCYTOSIS SYMPTOMATIC 04/23/2007   BPH (benign prostatic hyperplasia) 04/16/2007   Constipation 10/16/2006   Hyperlipidemia LDL goal <100 04/06/2006   Essential hypertension 04/06/2006   Cardiac dysrhythmia 04/06/2006   Allergic rhinitis 04/06/2006   GERD (gastroesophageal reflux disease) 04/06/2006   Diaphragmatic hernia 04/06/2006   Rheumatoid arthritis (HCC) 04/06/2006   LOW BACK PAIN 04/06/2006      ROS    Objective:     BP (!) 182/89   Pulse 88   Ht 5' 9 (1.753 m)   Wt 216 lb (98 kg)   SpO2 97%   BMI 31.90 kg/m  BP  Readings from Last 3 Encounters:  04/19/24 (!) 182/89  04/10/24 136/80  01/03/24 (!) 186/92   Wt Readings from Last 3 Encounters:  04/19/24 216 lb (98 kg)  04/10/24 219 lb (99.3 kg)  12/25/23 202 lb (91.6 kg)     Physical Exam   No results found for any visits on 04/19/24.  {Labs (Optional):23779}  The ASCVD Risk score (Arnett DK, et al., 2019) failed to calculate for the following reasons:   Risk score cannot be calculated because patient has a medical history suggesting prior/existing ASCVD   * - Cholesterol units were assumed    Assessment & Plan:   Problem List Items Addressed This Visit   None   No follow-ups on file.    Leita Longs, FNP  "

## 2024-04-21 ENCOUNTER — Encounter: Payer: Self-pay | Admitting: Podiatry

## 2024-04-21 DIAGNOSIS — R030 Elevated blood-pressure reading, without diagnosis of hypertension: Secondary | ICD-10-CM | POA: Insufficient documentation

## 2024-04-21 NOTE — Assessment & Plan Note (Signed)
 Blood pressure 160/92 mmHg. Medication not taken this morning. No home monitoring in place. - Advised obtaining blood pressure monitor from medical supply company. - Encouraged regular home blood pressure monitoring.

## 2024-04-21 NOTE — Assessment & Plan Note (Signed)
 Pain localized to shoulder and upper arm post-fall. - Ordered x-ray of left shoulder to rule out fracture. - Referred to hospital for x-ray.

## 2024-04-21 NOTE — Progress Notes (Signed)
"  °  Subjective:  Patient ID: Jonathan [name], male    DOB: 09-27-1945,  MRN: [id number]  Harvey LITTIE Amel presents to clinic today for at risk foot care. Pt has h/o NIDDM with PAD and preulcerative lesion(s) b/l feet and painful mycotic toenails that limit ambulation. Painful toenails interfere with ambulation. Aggravating factors include wearing enclosed shoe gear. Pain is relieved with periodic professional debridement. Painful preulcerative lesion(s) is/are aggravated when weightbearing with and without shoegear. Pain is relieved with periodic professional debridement.  Chief Complaint  Patient presents with   Diabetes    DFC NIDDM A1C 6.1. Toenail trim. Upcoming appointment with PCP 05/16/23   New problem(s): None.   PCP is Tobie Suzzane POUR, MD.  Allergies[1]  Review of Systems: Negative except as noted in the HPI.  Objective: No changes noted in today's physical examination. There were no vitals filed for this visit. Jonathan [name] is a pleasant 78 y.o. male in NAD. AAO x 3.  Vascular Examination: CFT <4 seconds b/l. DP pulses diminished b/l. PT pulses diminished b/l. Digital hair absent. Skin temperature gradient warm to cool b/l. No ischemia or gangrene. No cyanosis or clubbing noted b/l. Trace edema noted BLE.   Neurological Examination: Sensation grossly intact b/l with 10 gram monofilament. Vibratory sensation intact b/l.   Dermatological Examination: Pedal skin thin, shiny and atrophic b/l. No open wounds. No interdigital macerations.   Toenails 1-5 b/l thick, discolored, elongated with subungual debris and pain on dorsal palpation.   Preulcerative lesion(s) submet head 1 right foot and submet head 5 b/l.  No erythema, no edema, no drainage, no fluctuance.  Musculoskeletal Examination: Muscle strength 5/5 to all lower extremity muscle groups bilaterally. Hammertoe deformity noted 2-5 b/l. Patient ambulates independent of any assistive aids.  Radiographs:  None  Assessment/Plan: 1. Pain due to onychomycosis of toenails of both feet   2. Pre-ulcerative calluses   3. Type II diabetes mellitus with peripheral circulatory disorder [facility])   Patient was evaluated and treated. All patient's and/or POA's questions/concerns addressed on today's visit. Toenails 1-5 b/l debrided in length and girth without incident. Preulcerative lesion(s) submet head 1 right foot and submet head 5 left foot pared with sharp debridement without incident. Continue daily foot inspections and monitor blood glucose per PCP/Endocrinologist's recommendations. Continue soft, supportive shoe gear daily. Report any pedal injuries to medical professional. Call office if there are any questions/concerns.  Return in about 3 months (around 07/15/2024).  Delon LITTIE Merlin, DPM       LOCATION: [address], [place] [address]                   Office [phone]   [place] LOCATION: [address] [place], [place] [address] Office [phone]     [1]  Allergies Allergen Reactions   Ciprofloxacin  Itching   Mosquito (Diagnostic) Itching   Oxycodone-Acetaminophen  Rash   "

## 2024-05-06 DIAGNOSIS — R399 Unspecified symptoms and signs involving the genitourinary system: Secondary | ICD-10-CM

## 2024-05-06 NOTE — Telephone Encounter (Signed)
" °  Urologic History:  Any Recent Urologic Surgeries or Procedures:no Recurrent UTI's:yes Cystitis: yes (he thinks so) Prostatitis:yes Kidney or Bladder Stones: no Plan: Walk-in Clinic: no Appointment w/Physician: [no Lab visit scheduled for urine drop off: Yes Advice given: scheduled UA drop off   Do you take on daily medications for UTI suppression No   "

## 2024-05-06 NOTE — Telephone Encounter (Signed)
 Attempted to call patient to finish needed documentation and scheduled UA drop off lvm for patient to call back will schedule patient for tomorrow in the meantime

## 2024-05-06 NOTE — Telephone Encounter (Signed)
 Dysuria  Patient called with c/o dysuria 7 days.  Pain: SHARP PAIN Severity: 10  Associated Signs and Symptoms:  Fever: NO Chills: NO Hematuria: NO Urgency: NO Frequency: YES Hesitancy: NO Incontinence: NO Nausea: NO Vomiting: NO       Call:  [phone]

## 2024-05-07 DIAGNOSIS — R399 Unspecified symptoms and signs involving the genitourinary system: Secondary | ICD-10-CM

## 2024-05-07 LAB — URINALYSIS, ROUTINE W REFLEX MICROSCOPIC
Bilirubin, UA: NEGATIVE
Glucose, UA: NEGATIVE
Ketones, UA: NEGATIVE
Leukocytes,UA: NEGATIVE
Nitrite, UA: NEGATIVE
Protein,UA: NEGATIVE
Specific Gravity, UA: 1.01 (ref 1.005–1.030)
Urobilinogen, Ur: 0.2 mg/dL (ref 0.2–1.0)
pH, UA: 6.5 (ref 5.0–7.5)

## 2024-05-07 LAB — MICROSCOPIC EXAMINATION
Bacteria, UA: NONE SEEN
WBC, UA: NONE SEEN /HPF (ref 0–5)

## 2024-05-08 ENCOUNTER — Telehealth: Payer: Self-pay

## 2024-05-08 DIAGNOSIS — R399 Unspecified symptoms and signs involving the genitourinary system: Secondary | ICD-10-CM

## 2024-05-08 NOTE — Telephone Encounter (Signed)
 Tried calling pt with no answer. LVM for return call   Patient presents today with complaints of  UTI.  UA and Culture done today.  Dr. Matilda  reviewed results and No Treatment.  Patient aware of MD recommendations and that we will reach out with culture results.      [email or web address], CMA

## 2024-05-08 NOTE — Telephone Encounter (Signed)
-----   Message from Garnette Shack, MD sent at 05/07/2024  4:43 PM EST ----- Let patient know that urinalysis looked fine

## 2024-05-08 NOTE — Telephone Encounter (Signed)
 Pt return call. Pt given results, pt state's he is hurting bad when he voids. Pt is aware a message will be sent to MD on advisement . Voiced understanding

## 2024-05-09 LAB — URINE CULTURE

## 2024-05-10 ENCOUNTER — Ambulatory Visit: Payer: Self-pay

## 2024-05-14 ENCOUNTER — Other Ambulatory Visit (HOSPITAL_COMMUNITY): Payer: Self-pay

## 2024-05-14 MED ORDER — DOXYCYCLINE HYCLATE 100 MG PO TABS
100.0000 mg | ORAL_TABLET | Freq: Two times a day (BID) | ORAL | 0 refills | Status: DC
  Filled 2024-05-14: qty 56, 28d supply, fill #0

## 2024-05-14 MED ORDER — DOXYCYCLINE HYCLATE 100 MG PO TABS: 100.0000 mg | ORAL_TABLET | Freq: Two times a day (BID) | ORAL | 0 refills | Status: AC

## 2024-05-14 NOTE — Addendum Note (Signed)
 Addended by: GRETTA MASTERS R on: 05/14/2024 04:47 PM   Modules accepted: Orders

## 2024-05-14 NOTE — Addendum Note (Signed)
 Addended by: GRETTA MASTERS R on: 05/14/2024 03:59 PM   Modules accepted: Orders

## 2024-05-14 NOTE — Telephone Encounter (Signed)
 Tried calling pt with no answer LVM Doxycycline  100mg  BID for 28 days

## 2024-05-15 ENCOUNTER — Ambulatory Visit: Admitting: Urology

## 2024-05-15 VITALS — BP 161/83 | HR 69

## 2024-05-15 DIAGNOSIS — N400 Enlarged prostate without lower urinary tract symptoms: Secondary | ICD-10-CM | POA: Diagnosis not present

## 2024-05-15 DIAGNOSIS — N35011 Post-traumatic bulbous urethral stricture: Secondary | ICD-10-CM

## 2024-05-15 DIAGNOSIS — R351 Nocturia: Secondary | ICD-10-CM

## 2024-05-15 DIAGNOSIS — N35919 Unspecified urethral stricture, male, unspecified site: Secondary | ICD-10-CM

## 2024-05-15 MED ORDER — FINASTERIDE 5 MG PO TABS: 5.0000 mg | ORAL_TABLET | Freq: Every day | ORAL | 2 refills | Status: AC

## 2024-05-15 MED ORDER — SILODOSIN 8 MG PO CAPS: 8.0000 mg | ORAL_CAPSULE | Freq: Every day | ORAL | 3 refills | Status: AC

## 2024-05-15 NOTE — Progress Notes (Signed)
 "  05/15/2024 3:39 PM   Jonathan [name] 1945-05-06 [id number]  Referring provider: Health, [facility] [address] [place],  [place] [address]  Followup urethral stricture   HPI: Mr Jonathan [name] is a 79yo here for followup for urethral stricture. He has been urinating well since urethral dilation. He denies any dysuria or gross hematuria. IPSS 14 QOL 2 on rapaflo  8mg  daily and finasteride  5mg  daily. No straining to urinate. Nocturia 2-3x. No other complaints today   PMH: Past Medical History:  Diagnosis Date   Arthritis    ra, sees dr lynwood bent   Chronic kidney disease    Coronary artery disease    saw dr orren in [place] 20 yrs ago, released from cardiology   Diabetes mellitus without complication [facility])    type 2   Dyspnea 07/2022   Lately r/t CAD   GERD (gastroesophageal reflux disease)    Hypercholesteremia    Hypertension    Peripheral vascular disease    Stroke (HCC) 2007   Light Stroke per pt. No deficits   Urethral stricture     Surgical History: Past Surgical History:  Procedure Laterality Date   ABDOMINAL AORTOGRAM W/LOWER EXTREMITY N/A 04/20/2022   Procedure: ABDOMINAL AORTOGRAM W/LOWER EXTREMITY;  Surgeon: Darron Deatrice LABOR, MD;  Location: [facility];  Service: Cardiovascular;  Laterality: N/A;   BACK SURGERY     x 3 lower back   CARDIAC CATHETERIZATION  2000   small amt plaque relased from cardiology 20 yrs ago   CHOLECYSTECTOMY     COLONOSCOPY WITH PROPOFOL  N/A 10/26/2021   Procedure: COLONOSCOPY WITH PROPOFOL ;  Surgeon: Cindie Carlin POUR, DO;  Location: [facility];  Service: Endoscopy;  Laterality: N/A;  To arrive at 1200   CORONARY ARTERY BYPASS GRAFT N/A 07/07/2022   Procedure: CORONARY ARTERY BYPASS GRAFTING (CABG) X5 USING OPEN LEFT INTERNAL MAMMARY ARTERY AND ENDOSCOPICALLY HARVESTED RIGHT GREATER SAPHENOUS VEIN;  Surgeon: [name] Dorise POUR, MD;  Location: [facility] OR;  Service: Open Heart Surgery;  Laterality: N/A;   CYSTOSCOPY WITH URETHRAL  DILATATION N/A 04/17/2019   Procedure: CYSTOSCOPY WITH URETHRAL DILATATION, CYSTOGRAM;  Surgeon: Devere Lonni Righter, MD;  Location: [facility];  Service: Urology;  Laterality: N/A;   EYE SURGERY Bilateral    cataracts   IR RADIOLOGIST EVAL & MGMT  07/12/2021   IR RADIOLOGIST EVAL & MGMT  12/14/2022   PERIPHERAL VASCULAR BALLOON ANGIOPLASTY  04/20/2022   Procedure: PERIPHERAL VASCULAR BALLOON ANGIOPLASTY;  Surgeon: Darron Deatrice LABOR, MD;  Location: [facility];  Service: Cardiovascular;;  aborted; unable to cross   POLYPECTOMY  10/26/2021   Procedure: POLYPECTOMY INTESTINAL;  Surgeon: Cindie Carlin POUR, DO;  Location: [facility];  Service: Endoscopy;;   RIGHT/LEFT HEART CATH AND CORONARY ANGIOGRAPHY Bilateral 06/24/2022   Procedure: RIGHT/LEFT HEART CATH AND CORONARY ANGIOGRAPHY;  Surgeon: Darron Deatrice LABOR, MD;  Location: [facility] INVASIVE CV LAB;  Service: Cardiovascular;  Laterality: Bilateral;   TEE WITHOUT CARDIOVERSION N/A 07/07/2022   Procedure: TRANSESOPHAGEAL ECHOCARDIOGRAM;  Surgeon: [name] Dorise POUR, MD;  Location: [facility] OR;  Service: Open Heart Surgery;  Laterality: N/A;   urethral stricture surgery  15 yrs ago    Home Medications:  Allergies as of 05/15/2024       Reactions   Ciprofloxacin  Itching   Mosquito (diagnostic) Itching   Oxycodone-acetaminophen  Rash        Medication List        Accurate as of May 15, 2024  3:39 PM.  If you have any questions, ask your nurse or doctor.          acetaminophen  325 MG tablet Commonly known as: TYLENOL  Take 2 tablets (650 mg total) by mouth every 4 (four) hours as needed.   albuterol 108 (90 Base) MCG/ACT inhaler Commonly known as: VENTOLIN HFA Inhale 1-2 puffs into the lungs every 6 (six) hours as needed for wheezing or shortness of breath.   allopurinol  100 MG tablet Commonly known as: ZYLOPRIM  Take 1 tablet (100 mg total) by mouth 2 (two) times daily.   aspirin  EC 81 MG tablet Take 81 mg by  mouth daily. Swallow whole.   BLUE-EMU MAXIMUM STRENGTH EX Apply 1 Application topically daily as needed (pain).   calcitRIOL 0.25 MCG capsule Commonly known as: ROCALTROL Take 0.25 mcg by mouth every other day.   chlorthalidone 25 MG tablet Commonly known as: HYGROTON Take 12.5 mg by mouth every morning.   cholecalciferol  25 MCG (1000 UNIT) tablet Commonly known as: VITAMIN D3 Take 1,000 Units by mouth daily.   clopidogrel  75 MG tablet Commonly known as: PLAVIX  Take 1 tablet (75 mg total) by mouth daily.   cyanocobalamin  1000 MCG tablet Commonly known as: VITAMIN B12 Take 1,000 mcg by mouth daily.   cyclobenzaprine  10 MG tablet Commonly known as: FLEXERIL  Take 1 tablet (10 mg total) by mouth 3 (three) times daily as needed for muscle spasms.   doxycycline  100 MG tablet Commonly known as: VIBRA -TABS Take 1 tablet (100 mg total) by mouth every 12 (twelve) hours.   famotidine  20 MG tablet Commonly known as: Pepcid  Take 1 tablet (20 mg total) by mouth 2 (two) times daily.   finasteride  5 MG tablet Commonly known as: PROSCAR  Take 1 tablet (5 mg total) by mouth daily.   folic acid  1 MG tablet Commonly known as: FOLVITE  Take 1 mg by mouth daily.   gabapentin  300 MG capsule Commonly known as: NEURONTIN  Take 1 capsule (300 mg total) by mouth at bedtime.   glipiZIDE  5 MG tablet Commonly known as: GLUCOTROL  TAKE 1 TABLET BY MOUTH TWICE DAILY BEFORE A MEAL   hydrocortisone  2.5 % rectal cream Commonly known as: ANUSOL -HC Place 1 Application rectally 2 (two) times daily. For 10 days. May repeat cycle if needed.   Linzess 145 MCG Caps capsule Generic drug: linaclotide Take 145 mcg by mouth daily as needed (constipation).   losartan  100 MG tablet Commonly known as: COZAAR  1 tablet Orally Once a day   methotrexate  2.5 MG tablet Commonly known as: RHEUMATREX Take 5 tablets (12.5 mg total) by mouth every Sunday. Resume on Sunday 07/24/22. Caution:Chemotherapy. Protect  from light.   metoprolol  succinate 100 MG 24 hr tablet Commonly known as: TOPROL -XL Take 1 tablet (100 mg total) by mouth daily.   rosuvastatin  20 MG tablet Commonly known as: CRESTOR  Take 1 tablet (20 mg total) by mouth daily.   silodosin  8 MG Caps capsule Commonly known as: RAPAFLO  Take 1 capsule (8 mg total) by mouth at bedtime.   triamcinolone  cream 0.1 % Commonly known as: KENALOG  Apply 1 Application topically daily as needed (itching).   vitamin E  180 MG (400 UNITS) capsule Take 400 Units by mouth daily.        Allergies: Allergies[1]  Family History: Family History  Problem Relation Age of Onset   Heart Problems Father     Social History:  reports that he has never smoked. He has never used smokeless tobacco. He reports that he does not drink alcohol  and  does not use drugs.  ROS: All other review of systems were reviewed and are negative except what is noted above in HPI  Physical Exam: BP (!) 161/83   Pulse 69   Constitutional:  Alert and oriented, No acute distress. HEENT: [place] AT, moist mucus membranes.  Trachea midline, no masses. Cardiovascular: No clubbing, cyanosis, or edema. Respiratory: Normal respiratory effort, no increased work of breathing. GI: Abdomen is soft, nontender, nondistended, no abdominal masses GU: No CVA tenderness.  Lymph: No cervical or inguinal lymphadenopathy. Skin: No rashes, bruises or suspicious lesions. Neurologic: Grossly intact, no focal deficits, moving all 4 extremities. Psychiatric: Normal mood and affect.  Laboratory Data: Lab Results  Component Value Date   WBC 8.6 03/23/2023   HGB 12.4 (L) 03/23/2023   HCT 38.5 03/23/2023   MCV 91.0 03/23/2023   PLT 213 03/23/2023    Lab Results  Component Value Date   CREATININE 1.87 (H) 01/26/2024    No results found for: PSA  No results found for: TESTOSTERONE  Lab Results  Component Value Date   HGBA1C 6.1 (H) 07/05/2022    Urinalysis    Component Value  Date/Time   COLORURINE YELLOW 07/05/2022 1651   APPEARANCEUR Clear 05/07/2024 1358   LABSPEC 1.011 07/05/2022 1651   PHURINE 5.0 07/05/2022 1651   GLUCOSEU Negative 05/07/2024 1358   HGBUR MODERATE (A) 07/05/2022 1651   HGBUR negative 04/16/2007 0000   BILIRUBINUR Negative 05/07/2024 1358   KETONESUR NEGATIVE 07/05/2022 1651   PROTEINUR Negative 05/07/2024 1358   PROTEINUR NEGATIVE 07/05/2022 1651   UROBILINOGEN 0.2 12/28/2020 1532   UROBILINOGEN negative 04/16/2007 0000   NITRITE Negative 05/07/2024 1358   NITRITE NEGATIVE 07/05/2022 1651   LEUKOCYTESUR Negative 05/07/2024 1358   LEUKOCYTESUR NEGATIVE 07/05/2022 1651    Lab Results  Component Value Date   LABMICR See below: 05/07/2024   WBCUA None seen 05/07/2024   LABEPIT 0-10 05/07/2024   MUCUS Present 05/12/2021   BACTERIA None seen 05/07/2024    Pertinent Imaging:  No results found for this or any previous visit.  No results found for this or any previous visit.  No results found for this or any previous visit.  No results found for this or any previous visit.  Results for orders placed during the hospital encounter of 11/07/23  US  RENAL  Narrative CLINICAL DATA:  Chronic kidney disease stage 3 B.  EXAM: RENAL / URINARY TRACT ULTRASOUND COMPLETE  COMPARISON:  None Available.  FINDINGS: Right Kidney:  Renal measurements: 8.2 cm x 4.3 cm x 5.2 cm = volume: 96.8 mL. Echogenicity within normal limits. No mass or perinephric fluid. Moderate hydronephrosis which persists after voiding.  Left Kidney:  Renal measurements: 8.1 cm x 3.8 cm x 4.1 cm = volume: 66.2 mL. Echogenicity within normal limits. No mass or perinephric fluid. Moderate hydronephrosis which remained postvoiding.  Bladder:  Appears normal for degree of bladder distention. There is a bladder diverticulum measuring 3.4 cm x 3.4 cm x 4.5 cm. Prevoid volume 292.7 and post void volume 205.9  Other:  None.  IMPRESSION: Moderate  bilateral hydronephrosis which remained postvoiding. Large bladder diverticulum. Large postvoid volume.   Electronically Signed By: Cordella Banner On: 11/07/2023 15:54  No results found for this or any previous visit.  Results for orders placed during the hospital encounter of 03/24/21  CT HEMATURIA WORKUP  Narrative CLINICAL DATA:  Microhematuria  EXAM: CT ABDOMEN AND PELVIS WITHOUT AND WITH CONTRAST  TECHNIQUE: Multidetector CT imaging of the abdomen and  pelvis was performed following the standard protocol before and following the bolus administration of intravenous contrast.  CONTRAST:  100mL OMNIPAQUE  IOHEXOL  350 MG/ML SOLN  COMPARISON:  None.  FINDINGS: Lower chest: No acute abnormality. Bandlike scarring of the bilateral lung bases.  Hepatobiliary: No focal liver abnormality is seen. Status post cholecystectomy. No biliary dilatation.  Pancreas: Unremarkable. No pancreatic ductal dilatation or surrounding inflammatory changes.  Spleen: Normal in size without significant abnormality.  Adrenals/Urinary Tract: Adrenal glands are unremarkable. Occasional small bilateral renal cysts. Kidneys are otherwise normal, without renal calculi, solid lesion, or hydronephrosis. No evidence of urinary tract filling defect on delayed phase imaging. Thickening of the urinary bladder wall. Diverticulum of the anterior bladder dome (series 7, image 74, series 12, image 69).  Stomach/Bowel: Stomach is within normal limits. Appendix appears normal. No evidence of bowel wall thickening, distention, or inflammatory changes.  Vascular/Lymphatic: Aortic atherosclerosis. No enlarged abdominal or pelvic lymph nodes.  Reproductive: Prostatomegaly with median lobe hypertrophy.  Other: No abdominal wall hernia or abnormality. No abdominopelvic ascites.  Musculoskeletal: No acute or significant osseous findings.  IMPRESSION: 1. No evidence of urinary tract calculus, mass, or  hydronephrosis. No evidence of urinary tract filling defect on delayed phase imaging. 2. Thickening of the urinary bladder wall, likely related to chronic outlet obstruction although infectious or inflammatory cystitis is a differential consideration in the setting of hematuria. 3. Prostatomegaly.  Aortic Atherosclerosis (ICD10-I70.0).   Electronically Signed By: Marolyn JONETTA Jaksch M.D. On: 03/26/2021 10:13  Results for orders placed in visit on 12/01/23  CT RENAL STONE STUDY  Narrative CLINICAL DATA:  Evaluate bladder diverticulum  EXAM: CT ABDOMEN AND PELVIS WITHOUT CONTRAST  TECHNIQUE: Multidetector CT imaging of the abdomen and pelvis was performed following the standard protocol without IV contrast.  RADIATION DOSE REDUCTION: This exam was performed according to the departmental dose-optimization program which includes automated exposure control, adjustment of the mA and/or kV according to patient size and/or use of iterative reconstruction technique.  COMPARISON:  03/24/2021  FINDINGS: Lower chest: Lung bases demonstrate mild scarring stable from the prior exam. Calcified granuloma is noted on the right.  Hepatobiliary: No focal liver abnormality is seen. Status post cholecystectomy. No biliary dilatation.  Pancreas: Unremarkable. No pancreatic ductal dilatation or surrounding inflammatory changes.  Spleen: Normal in size without focal abnormality.  Adrenals/Urinary Tract: Adrenal glands are within normal limits. Bilateral hydronephrosis and hydroureter is identified extending to the level urinary bladder. This is new from the prior exam. No obstructing calculi are seen. Bladder diverticulum is noted superiorly on the right. Areas of wall thickening are seen primarily along the anterior aspect of the bladder. There are also findings suggestive of a mass in the base of the bladder posteriorly. This may simply be related to indention from the underlying  prostate.  Stomach/Bowel: No obstructive or inflammatory changes of the colon are noted. Diverticulosis is seen. The appendix is within normal limits. Small bowel and stomach are unremarkable.  Vascular/Lymphatic: Aortic atherosclerosis. No enlarged abdominal or pelvic lymph nodes.  Reproductive: Prostate is prominent indenting upon the inferior aspect of urinary bladder.  Other: No abdominal wall hernia or abnormality. No abdominopelvic ascites.  Musculoskeletal: No acute or significant osseous findings.  IMPRESSION: Bilateral hydronephrosis without obstructing calculi.  Bladder wall thickening and irregularity increased from the prior exam as well as suspicion for an underlying mass at the base of the bladder posteriorly. Direct visualization would be helpful.  Bladder diverticulum stable from previous exams.  Diverticulosis without diverticulitis.  Electronically Signed By: Oneil Devonshire M.D. On: 12/24/2023 19:58   Assessment & Plan:    1. Benign prostatic hyperplasia, unspecified whether lower urinary tract symptoms present (Primary) Continue rapaflo  8mg  daily and finasteride  5mg  daily  2. Nocturia -continue rapaflo  8mg  daily  3. Urethral stricture -followup 6 monhts with flow PVR   No follow-ups on file.  Belvie Clara, MD  [facility] [place]       [1]  Allergies Allergen Reactions   Ciprofloxacin  Itching   Mosquito (Diagnostic) Itching   Oxycodone-Acetaminophen  Rash   "

## 2024-05-15 NOTE — Progress Notes (Signed)
 Uroflowmetry order received. Patient voided own on with the following results:   Uroflow  Peak Flow: 8ml Average Flow: 4ml Voided Volume: Voiding Time: 70sec Flow Time: 47sec Time to Peak Flow: 17sec   Pvr = 255

## 2024-05-15 NOTE — Telephone Encounter (Signed)
Pt made aware in office.

## 2024-05-16 ENCOUNTER — Encounter: Payer: Self-pay | Admitting: Urology

## 2024-05-16 LAB — URINALYSIS, ROUTINE W REFLEX MICROSCOPIC
Bilirubin, UA: NEGATIVE
Glucose, UA: NEGATIVE
Ketones, UA: NEGATIVE
Leukocytes,UA: NEGATIVE
Nitrite, UA: NEGATIVE
Protein,UA: NEGATIVE
Specific Gravity, UA: 1.005 (ref 1.005–1.030)
Urobilinogen, Ur: NEGATIVE mg/dL (ref 0.2–1.0)
pH, UA: 6 (ref 5.0–7.5)

## 2024-05-16 LAB — MICROSCOPIC EXAMINATION
Bacteria, UA: NONE SEEN
WBC, UA: NONE SEEN /HPF (ref 0–5)

## 2024-05-16 NOTE — Patient Instructions (Signed)

## 2024-05-18 ENCOUNTER — Ambulatory Visit: Payer: Self-pay

## 2024-05-22 ENCOUNTER — Encounter: Payer: Self-pay | Admitting: Family Medicine

## 2024-07-02 ENCOUNTER — Ambulatory Visit: Payer: Self-pay

## 2024-07-15 ENCOUNTER — Ambulatory Visit: Payer: Self-pay | Admitting: Internal Medicine

## 2024-07-30 ENCOUNTER — Ambulatory Visit: Admitting: Podiatry

## 2024-09-09 ENCOUNTER — Ambulatory Visit: Admitting: Student

## 2024-11-25 ENCOUNTER — Ambulatory Visit: Admitting: Urology
